# Patient Record
Sex: Male | Born: 1970 | Race: Black or African American | Hispanic: No | Marital: Single | Smoking: Current every day smoker
Health system: Southern US, Community
[De-identification: ages and names within clinical notes are randomized; demographics above are authoritative.]

## PROBLEM LIST (undated history)

## (undated) DIAGNOSIS — N400 Enlarged prostate without lower urinary tract symptoms: Secondary | ICD-10-CM

## (undated) DIAGNOSIS — K219 Gastro-esophageal reflux disease without esophagitis: Secondary | ICD-10-CM

## (undated) DIAGNOSIS — C61 Malignant neoplasm of prostate: Secondary | ICD-10-CM

## (undated) DIAGNOSIS — I739 Peripheral vascular disease, unspecified: Secondary | ICD-10-CM

## (undated) DIAGNOSIS — N289 Disorder of kidney and ureter, unspecified: Secondary | ICD-10-CM

## (undated) DIAGNOSIS — J101 Influenza due to other identified influenza virus with other respiratory manifestations: Secondary | ICD-10-CM

## (undated) DIAGNOSIS — Z9289 Personal history of other medical treatment: Secondary | ICD-10-CM

## (undated) DIAGNOSIS — E119 Type 2 diabetes mellitus without complications: Secondary | ICD-10-CM

## (undated) DIAGNOSIS — E781 Pure hyperglyceridemia: Secondary | ICD-10-CM

## (undated) DIAGNOSIS — M8700 Idiopathic aseptic necrosis of unspecified bone: Secondary | ICD-10-CM

## (undated) DIAGNOSIS — H409 Unspecified glaucoma: Secondary | ICD-10-CM

## (undated) DIAGNOSIS — D696 Thrombocytopenia, unspecified: Secondary | ICD-10-CM

## (undated) DIAGNOSIS — T8859XA Other complications of anesthesia, initial encounter: Secondary | ICD-10-CM

## (undated) DIAGNOSIS — F17200 Nicotine dependence, unspecified, uncomplicated: Secondary | ICD-10-CM

## (undated) DIAGNOSIS — R9431 Abnormal electrocardiogram [ECG] [EKG]: Secondary | ICD-10-CM

## (undated) DIAGNOSIS — J189 Pneumonia, unspecified organism: Secondary | ICD-10-CM

## (undated) DIAGNOSIS — R000 Tachycardia, unspecified: Secondary | ICD-10-CM

## (undated) DIAGNOSIS — D649 Anemia, unspecified: Secondary | ICD-10-CM

## (undated) DIAGNOSIS — N186 End stage renal disease: Secondary | ICD-10-CM

## (undated) DIAGNOSIS — Z9489 Other transplanted organ and tissue status: Secondary | ICD-10-CM

## (undated) DIAGNOSIS — D479 Neoplasm of uncertain behavior of lymphoid, hematopoietic and related tissue, unspecified: Secondary | ICD-10-CM

## (undated) DIAGNOSIS — I10 Essential (primary) hypertension: Secondary | ICD-10-CM

## (undated) HISTORY — PX: KIDNEY TRANSPLANT: SHX239

## (undated) HISTORY — PX: HIP SURGERY: SHX245

## (undated) HISTORY — PX: JOINT REPLACEMENT: SHX530

## (undated) HISTORY — PX: CHOLECYSTECTOMY: SHX55

---

## 1998-02-24 ENCOUNTER — Emergency Department (HOSPITAL_COMMUNITY): Admission: EM | Admit: 1998-02-24 | Discharge: 1998-02-24 | Payer: Self-pay | Admitting: Emergency Medicine

## 2000-10-08 ENCOUNTER — Encounter: Payer: Self-pay | Admitting: Emergency Medicine

## 2000-10-08 ENCOUNTER — Emergency Department (HOSPITAL_COMMUNITY): Admission: EM | Admit: 2000-10-08 | Discharge: 2000-10-08 | Payer: Self-pay

## 2003-02-08 ENCOUNTER — Encounter: Payer: Self-pay | Admitting: Vascular Surgery

## 2003-02-08 ENCOUNTER — Inpatient Hospital Stay (HOSPITAL_COMMUNITY): Admission: EM | Admit: 2003-02-08 | Discharge: 2003-02-15 | Payer: Self-pay | Admitting: Emergency Medicine

## 2003-02-08 ENCOUNTER — Encounter: Payer: Self-pay | Admitting: Nephrology

## 2003-02-08 ENCOUNTER — Encounter: Payer: Self-pay | Admitting: Emergency Medicine

## 2003-02-10 ENCOUNTER — Encounter: Payer: Self-pay | Admitting: Nephrology

## 2003-04-08 ENCOUNTER — Ambulatory Visit (HOSPITAL_COMMUNITY): Admission: RE | Admit: 2003-04-08 | Discharge: 2003-04-08 | Payer: Self-pay | Admitting: Vascular Surgery

## 2003-05-13 ENCOUNTER — Emergency Department (HOSPITAL_COMMUNITY): Admission: EM | Admit: 2003-05-13 | Discharge: 2003-05-14 | Payer: Self-pay | Admitting: Emergency Medicine

## 2003-05-14 ENCOUNTER — Encounter: Payer: Self-pay | Admitting: Emergency Medicine

## 2003-06-03 ENCOUNTER — Ambulatory Visit (HOSPITAL_COMMUNITY): Admission: RE | Admit: 2003-06-03 | Discharge: 2003-06-03 | Payer: Self-pay | Admitting: Nephrology

## 2003-06-30 ENCOUNTER — Ambulatory Visit (HOSPITAL_COMMUNITY): Admission: RE | Admit: 2003-06-30 | Discharge: 2003-06-30 | Payer: Self-pay | Admitting: Vascular Surgery

## 2003-07-12 ENCOUNTER — Emergency Department (HOSPITAL_COMMUNITY): Admission: EM | Admit: 2003-07-12 | Discharge: 2003-07-12 | Payer: Self-pay | Admitting: Emergency Medicine

## 2004-01-29 ENCOUNTER — Inpatient Hospital Stay (HOSPITAL_COMMUNITY): Admission: EM | Admit: 2004-01-29 | Discharge: 2004-01-29 | Payer: Self-pay | Admitting: Emergency Medicine

## 2004-02-22 ENCOUNTER — Emergency Department (HOSPITAL_COMMUNITY): Admission: EM | Admit: 2004-02-22 | Discharge: 2004-02-22 | Payer: Self-pay | Admitting: Emergency Medicine

## 2010-03-06 ENCOUNTER — Emergency Department (HOSPITAL_COMMUNITY): Admission: EM | Admit: 2010-03-06 | Discharge: 2010-03-07 | Payer: Self-pay | Admitting: Emergency Medicine

## 2010-08-12 ENCOUNTER — Encounter: Payer: Self-pay | Admitting: Nephrology

## 2010-08-21 ENCOUNTER — Emergency Department (HOSPITAL_COMMUNITY)
Admission: EM | Admit: 2010-08-21 | Discharge: 2010-08-21 | Payer: Self-pay | Source: Home / Self Care | Admitting: Emergency Medicine

## 2010-08-21 LAB — URINE MICROSCOPIC-ADD ON

## 2010-08-21 LAB — URINALYSIS, ROUTINE W REFLEX MICROSCOPIC
Bilirubin Urine: NEGATIVE
Urine Glucose, Fasting: NEGATIVE mg/dL
Urobilinogen, UA: 0.2 mg/dL (ref 0.0–1.0)

## 2010-08-21 LAB — CBC
HCT: 38.1 % — ABNORMAL LOW (ref 39.0–52.0)
Hemoglobin: 13.3 g/dL (ref 13.0–17.0)
MCH: 32 pg (ref 26.0–34.0)
Platelets: 175 10*3/uL (ref 150–400)
RBC: 4.15 MIL/uL — ABNORMAL LOW (ref 4.22–5.81)
RDW: 14.1 % (ref 11.5–15.5)
WBC: 7.9 10*3/uL (ref 4.0–10.5)

## 2010-08-21 LAB — BASIC METABOLIC PANEL
CO2: 24 mEq/L (ref 19–32)
Calcium: 9.5 mg/dL (ref 8.4–10.5)
Creatinine, Ser: 1.36 mg/dL (ref 0.4–1.5)
GFR calc Af Amer: >60 mL/min (ref >60)
Glucose, Bld: 101 mg/dL — ABNORMAL HIGH (ref 70–99)
Potassium: 3.4 mEq/L — ABNORMAL LOW (ref 3.5–5.1)
Sodium: 139 mEq/L (ref 135–145)

## 2010-08-21 LAB — DIFFERENTIAL
Eosinophils Absolute: 0.1 10*3/uL (ref 0.0–0.7)
Lymphs Abs: 1 10*3/uL (ref 0.7–4.0)

## 2010-08-22 LAB — URINE CULTURE: Colony Count: NO GROWTH

## 2010-10-05 LAB — CBC
HCT: 38.5 % — ABNORMAL LOW (ref 39.0–52.0)
Hemoglobin: 13.6 g/dL (ref 13.0–17.0)
MCH: 31.4 pg (ref 26.0–34.0)
RBC: 4.33 MIL/uL (ref 4.22–5.81)
WBC: 7 10*3/uL (ref 4.0–10.5)

## 2010-10-05 LAB — PROTEIN, BODY FLUID: Total protein, fluid: <3 g/dL

## 2010-10-05 LAB — BASIC METABOLIC PANEL
CO2: 22 mEq/L (ref 19–32)
Calcium: 9.2 mg/dL (ref 8.4–10.5)
Chloride: 106 mEq/L (ref 96–112)
Creatinine, Ser: 1.11 mg/dL (ref 0.4–1.5)
Glucose, Bld: 88 mg/dL (ref 70–99)

## 2010-10-05 LAB — SYNOVIAL CELL COUNT + DIFF, W/ CRYSTALS
Crystals, Fluid: NONE SEEN
Monocyte-Macrophage-Synovial Fluid: 11 % — ABNORMAL LOW (ref 50–90)
Neutrophil, Synovial: 89 % — ABNORMAL HIGH (ref 0–25)

## 2010-10-05 LAB — CULTURE, BLOOD (ROUTINE X 2): Culture: NO GROWTH

## 2010-10-05 LAB — DIFFERENTIAL
Basophils Absolute: 0 10*3/uL (ref 0.0–0.1)
Basophils Relative: 0 % (ref 0–1)
Eosinophils Absolute: 0.1 10*3/uL (ref 0.0–0.7)
Neutro Abs: 5 10*3/uL (ref 1.7–7.7)
Neutrophils Relative %: 71 % (ref 43–77)

## 2010-10-05 LAB — GRAM STAIN

## 2010-10-05 LAB — ANAEROBIC CULTURE

## 2010-10-05 LAB — BODY FLUID CULTURE

## 2010-10-05 LAB — GLUCOSE, SEROUS FLUID: Glucose, Fluid: 61 mg/dL

## 2010-12-08 NOTE — Op Note (Signed)
NAME:  James [name], James [name].                        ACCOUNT NO.:  [id number]   MEDICAL RECORD NO.:  [id number]                   PATIENT TYPE:  INP   LOCATION:                                       FACILITY:  [facility]   PHYSICIAN:  Nelda Severe. Kellie Simmering, M.D.               DATE OF BIRTH:  12/26/1970   DATE OF PROCEDURE:  02/08/2003  DATE OF DISCHARGE:                                 OPERATIVE REPORT   PREOPERATIVE DIAGNOSIS:  End-stage renal disease.   POSTOPERATIVE DIAGNOSIS:  End-stage renal disease.   OPERATION:  1. Creation of a left upper  arm brachial artery to the cephalic vein     arteriovenous fistula Sherre Lain).  2. Insertion Diatek catheter, right internal jugular vein (24-cm).   SURGEON:  Nelda Severe. Kellie Simmering, M.D.   FIRST ASSISTANT:  Suzzanne Cloud, P.A.   ANESTHESIA:  Local.   DESCRIPTION OF PROCEDURE:  The patient was taken to the operating room and  placed in the supine position, at which time the left upper extremity was  prepped with Betadine scrub  and solution and draped in a routine sterile  manner. After infiltration with 1% Xylocaine with epinephrine, a transverse  incision was made in the antecubital area.   The cephalic vein was identified and dissected free. It was an excellent  vein, being about 3.5 to 4 mm in size. It was ligated distally and  transected and heparinized saline could be flushed and palpated up to the  shoulder area. The brachial artery was exposed beneath the fascia and  encircled with vessel loops. It was an excellent vessel. Then 3000 units of  heparin were given intravenously.   The brachial artery was occluded proximally  and distally with vessel loops.  The vein was then carefully  measured and spatulated. After occluding the  artery and opening it with a #15 blade and the Potts scissors, the vein was  anastomosed end-to-side with 6-0 Prolene. The clamp was then released and  there was a palpable pulse and excellent Doppler flow in the fistula.  No  Protamine was given. The wound was irrigated with saline and closed in  layers with Vicryl in a subcuticular fashion. A sterile dressing was  applied.   Attention was turned to the upper chest and neck  which were prepped with  Betadine scrub and solution and draped in a routine sterile manner. After  infiltration with 1% Xylocaine the right internal jugular vein was entered  using a supraclavicular approach. The guide wire passed into the right  atrium under fluoroscopic guidance.   After dilating the tract over the guide wire, a catheter was passed through  a peel-away sheath and positioned in the right atrium and tunneled  appropriately. The catheter was then secured with nylon sutures and the  wound was closed with Vicryl in a subcuticular fashion. A sterile dressing  was applied. The patient was taken to  the recovery room in stable condition.                                                 Nelda Severe Kellie Simmering, M.D.    JDL/MEDQ  D:  02/08/2003  T:  02/08/2003  Job:  [id number]

## 2010-12-08 NOTE — Discharge Summary (Signed)
NAME:  CREE, STRAWDER NO.:  [id number]   MEDICAL RECORD NO.:  [id number]                   PATIENT TYPE:  INP   LOCATION:  6741                                 FACILITY:  [facility]   PHYSICIAN:  Sherril Croon, M.D.                DATE OF BIRTH:  07-Feb-1971   DATE OF ADMISSION:  02/08/2003  DATE OF DISCHARGE:  02/15/2003                                 DISCHARGE SUMMARY   ADMISSION DIAGNOSES:  1. Anemia.  2. Uremia.  3. Hypertension.  4. Abdominal pain.   DISCHARGE DIAGNOSES:  1. Iron deficiency anemia.  2. Secondary hyperparathyroidism.  3. Hypertension.  4. Asymptomatic cholelithiasis.  5. End-stage renal disease with first hemodialysis treatment on 02/09/2003.   PROCEDURES:  1. On 02/08/2003 Dr. Kellie Simmering placed a right IJ Diatek catheter and created a     left AV fistula.  2. Chest x-ray was obtained on 02/08/2003 revealing posterior pleural     thickening on the left with probable blunting of the left posterior     costophrenic angle suggesting the possibility of a small left pleural     effusion.  There was mild cardiomegaly.  3. Ultrasound of the abdomen was performed on 02/08/2003 because of     abdominal pain, nausea; significant findings were; (1) two echogenic     filling defects present in the gallbladder, questionable gallstones,     sludge, (2) small cyst in the dome of the right hepatic lobe, (3)     bilateral abnormally echogenic kidneys, raising the possibility of     chronic renal disease.  4. CT of abdomen and pelvis were obtained because of renal insufficiency and     abdominal pain.  Impression cholelithiasis with a few dependent calcified     gallstones present in the left lower lobe lung atelectasis versus     infiltrate and normal unenhanced CT of the pelvis, read by Eulas Post T.     Kathlene Cote, M.D., this was done on 02/10/2003.  5. Transfusion occurred two units on 02/10/2003 and two units on 02/09/2003     packed red blood  cells due to a hemoglobin of 5.   CONSULTATIONS:  CVTS on 02/08/2003 for which they recommended AV fistula  creation and right IJ Diatek placement.   BRIEF HISTORY:  Mr. Hoole is a 40 year old African-American male who  presented to the emergency room on 02/08/2003 with nausea, vomiting, foul  breath and abdominal discomfort.  He admitted to Dr. Justin Mend upon presentation  in the emergency department that although he had denied fever, chills,  shortness of breath, he did state exertional dyspnea.  Also told Dr. 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  Details of  that hospitalization are still sketchy.   EMERGENCY DEPARTMENT PHYSICAL:  VITAL SIGNS:  Blood pressure 210/110, pulse  80, temperature 97.5.  GENERAL APPEARANCE:  Alert, pleasant gentleman in no obvious distress.  He  has uremic breath and pale complexion.  HEAD/EYES:  Normocephalic and atraumatic.  Pupils equal, round, reactive.  EARS/NOSE/MOUTH/THROAT:  External appearance normal.  NECK:  Supple.  No bruits.  No thyromegaly.  No adenopathy.  CARDIOVASCULAR:  Regular rate and rhythm.  He had an S3/S4 summation gallop.  RESPIRATORY:  Resonant breath sounds, had some bilateral respiratory  crackles.  ABDOMEN:  Soft, had some mild tenderness in the epigastric region, no  organomegaly.  Guaiac stools are negative.  NEUROLOGIC:  Cranial nerves are intact.  Reflexes symmetric.  Had well-  healed surgical scars over bilateral knees.  GENITALIA:  Circumcised bilateral descended testes.  EXTREMITIES:  Pulses 2+ femoral, 2+ PT, right and left.   EMERGENCY DEPARTMENT LABORATORY DATA:  INR 1.15, phosphorus 6.1, UA  greater  than 300 milligrams protein, sodium 132, potassium 4.1, chloride 93, CO2 of  25, BUN 74, creatinine 23.6, glucose 94, bilirubin 0.8, SGOT 11, SGPT 13,  alkaline phosphatase 80, albumin 2.7, calcium 8.9, WBC 7.6, hemoglobin 6.9,  platelets 120.  Serum creatinine in 09/2000 1.5.   HOSPITAL COURSE:  Problem 1.  Uremia:  CVTS was immediately consulted and  they recommended AV fistula creation and placement of Diatek catheter, for  which occurred on 02/08/2003, day of admission.  Mr.  Gledhill received his  first dialysis treatment on 02/09/2003 where we removed one liter, dialyzed  again the following day, removed another liter, dialyzed third day in a row  removing another liter.  We continued dialysis treatments, slowly increasing  his blood flow rates as well as his dialysis treatment times, for a seven  day hospital course; Mr. Hefter received five dialysis treatments.  The last  dialysis treatment on 02/15/2003 we were able to remove almost 2.4 liters.  By hospital day #4, the patient had no complaints, he was feeling well.  On  the day of discharge Mr. Sandefer's creatinine was 10.6, BUN 15,  dropping from  his admission creatinine of 23.6 and admission BUN of 74.   1. Anemia:  Upon admission Mr. Linan's hemoglobin was 6.9, it had continued     to drop down to 5.5 requiring four units of packed red blood cells over a     two day period.  Iron stores were checked, iron was 44, TIBC 171, 26%     trans sat, ferratin 364, Epogen was initiated at 10,000 units     intravenously each treatment and dextran begun as a test dose on     02/11/2003 and then 50 mg given each treatment.   1. Secondary hyperparathyroidism:  The parathyroid hormone was checked and     intact PTH revealed 639.0.  Calcium for PTH 7.5.  Calcijex was started at     0.25 mcg IV each treatment and was increased to 1.5 mcg each treatment.  1. Abdominal pain:  Upon presentation to the emergency department the     patient  complained of abdominal pain, as you can see the results above     and procedures, the CT scan and ultrasound, the patient had gallstones in     the gallbladder.  His dialysis was initiated and the abdominal pain  became better.  The abdominal pain was resolved by hospital day #3.  It     was decided to just follow the gallstones on an outpatient basis.  He had     spiked fevers  on 02/09/2003 for which blood cultures and urine were     obtained.  The blood cultures were obtained one time on 02/09/2003.     These blood cultures were negative.  Urine culture isolated no     uropathogens, he was however empirically started on Ancef 1 gram IV     q.24h.  This was changed to Ancef 2 grams IV each hemodialysis treatment     on 02/12/2003.  Ancef was continued on an outpatient basis because of     continued spiking of fevers and with placement of new catheter, we wanted     to ensure that we had no infection brewing, and the blood cultures final     results were not out until the day after discharge, so this was continued     empirically until 02/19/2003, but I believe at the time of this dictation     though I believe that was actually at the outpatient dialysis unit,     discontinued on 02/18/2003, because I looked up the blood cultures at the     dialysis unit and they turned out to be negative.   HOSPITAL COURSE DISPOSITION:  Mr. Dai upon admission lived with a friend,  in return he would do work for him, not really sure what kind of work that  was; Mr. Peot talked to this friend prior to being discharged about coming  back to live with him, and that friend refused Mr. Tavenner.  Mr. Andreoni thought  about moving into a shelter, he called his mother and his mother agreed to  let him come back home.  This has been difficult for Mr. Feldpausch to have to  move back home, but he agreed to do so given the circumstances.  He was  afraid that he would never be able to return to work, he has had one  year of  college, and we have encouraged him to, now that he is on dialysis, to  return to school.   DISCHARGE MEDICATIONS:  1. Nephro-Vite 1 tablet p.o. daily.  2. Norvasc 10 mg p.o. q.h.s.  3. Labetalol 400 mg 1 tablet b.i.d.  He understands not to take the a.m.     dose on dialysis days.  4. Os-Cal 500 mg 1 tablet with each meal.   DISCHARGE ACTIVITIES:  Activity as tolerated.   DISCHARGE DIET:  He understands to adhere to the Renal/ADA, 1200 cc fluid-  restricted diet.   DISCHARGE INSTRUCTIONS:  1. Mr. Cameron will go to hemodialysis at [facility] on Monday,     Wednesday and Friday at 11:30 a.m. beginning on 02/17/2003.  2. He understands to follow up with Dr. Kellie Simmering on 04/06/2003, Tuesday 10:30     a.m. at Dr. Evelena Leyden office on [facility].  3. Mr. Gaschler is discharged to home with mother in improved condition.   [facility] CHARGE NURSE INSTRUCTIONS: 1. Mr. Dutta will be on a 2K dialytic bath using an __________  [id number]     dialyzer, blood flow rate of 400, dialysis flow rate of 800, variable     sodium, standard heparin, dry weight will be 76 kg, dialysis length 4     hours and 15 minutes.  Epogen 10,000 units IV every treatment.  Calcijex     1.5 mcg IV every treatment.      Delray Alt, PA                   Sherril Croon, M.D.    MJG/MEDQ  D:  02/24/2003  T:  02/24/2003  Job:  [id number]

## 2010-12-08 NOTE — Op Note (Signed)
NAME:  KORIE, TETRICK                         ACCOUNT NO.:  [id number]   MEDICAL RECORD NO.:  [id number]                   PATIENT TYPE:  EMS   LOCATION:  MINO                                 FACILITY:  [facility]   PHYSICIAN:  Jessy Oto. Fields, MD               DATE OF BIRTH:  07/06/1971   DATE OF PROCEDURE:  DATE OF DISCHARGE:  07/12/2003                                 OPERATIVE REPORT   PROCEDURE:  Suture repair of bleeding AV fistula.   PREOPERATIVE DIAGNOSIS:  Hemorrhage from left upper arm AV fistula.   POSTOPERATIVE DIAGNOSIS:  Hemorrhage from left upper arm AV fistula.   ANESTHESIA:  Local.   INDICATIONS:  The patient is a 40 year old male who has had hemodialysis  earlier today and has had continued bleeding from puncture sites which could  not be controlled with pressure after two hours.   FINDINGS:  Two needle punctures; one actively bleeding, one nonactively  bleeding.  The actively bleeding one was repaired with a nylon stitch.   OPERATIVE DETAILS:  The patient's left upper arm was prepped and draped in  the usual sterile fashion.  Direct pressure was held on the fistula below  the operative field.  A 3.0 nylon suture was brought up in the operative  field and a single stitch was placed in the needle puncture site.  Hemostasis was obtained.  A dry sterile dressing was then placed over this.  The patient tolerated the procedure well and there were no complications.                                               Jessy Oto. Fields, MD    CEF/MEDQ  D:  07/12/2003  T:  07/13/2003  Job:  [id number]

## 2010-12-08 NOTE — Op Note (Signed)
NAME:  [name], James NO.:  [id number]   MEDICAL RECORD NO.:  [id number]                   PATIENT TYPE:  OIB   LOCATION:  2899                                 FACILITY:  [facility]   PHYSICIAN:  Nelda Severe. Kellie Simmering, M.D.               DATE OF BIRTH:  [birth year]   DATE OF PROCEDURE:  06/30/2003  DATE OF DISCHARGE:  06/30/2003                                 OPERATIVE REPORT   PREOPERATIVE DIAGNOSIS:  Pseudoaneurysm of arteriovenous fistula, left upper  arm.   POSTOPERATIVE DIAGNOSIS:  Pseudoaneurysm of arteriovenous fistula, left  upper arm.   OPERATION PERFORMED:  Ligation of arteriovenous fistula pseudoaneurysm in  the left upper extremity (suture repair of pseudoaneurysm).   SURGEON:  Nelda Severe. Kellie Simmering, M.D.   ASSISTANT:  John Giovanni, P.A.-C.   ANESTHESIA:  Local.   DESCRIPTION OF PROCEDURE:  The patient was taken to the operating room and  placed in the supine position at which time the left upper extremity was  prepped with Betadine scrub and solution and draped in routine sterile  manner.  After infiltration with 1% Xylocaine with epinephrine, a short  longitudinal incision was made medial to the fistula in the distal upper  arm.  There was a pseudoaneurysm emanating from the medial aspect of the  cephalic vein about 7 cm from the arterial anastomosis extending medially.  The incision was made medial to the pseudoaneurysm and dissection carried  down to the pseudoaneurysm wall.  3000 units of heparin given intravenously  and after dissecting it as much proximally and distally as possible through  the short incision, blood control was obtained by manual compression both  proximally and distally and the pseudoaneurysm sac was entered.  There were  two openings in the sac communicating with the cephalic vein and both of  these were oversewn with 6-0 and 5-0 Prolene from within.  After this was  completed, this obliterated the pseudoaneurysm sac and  there continued to be  an excellent pulse and thrill in the fistula.  No protamine was given.  The  wound was irrigated with saline and closed in layers with Vicryl in  subcuticular fashion.  Sterile dressing applied.  The patient was taken to  the recovery room in satisfactory condition.                                               Nelda Severe Kellie Simmering, M.D.    JDL/MEDQ  D:  06/30/2003  T:  07/01/2003  Job:  [id number]

## 2010-12-08 NOTE — H&P (Signed)
NAME:  James [name], James [name] NO.:  [id number]   MEDICAL RECORD NO.:  [id number]                   PATIENT TYPE:  INP   LOCATION:  6741                                 FACILITY:  [facility]   PHYSICIAN:  Sherril Croon, M.D.                DATE OF BIRTH:  06-Mar-1971   DATE OF ADMISSION:  02/08/2003  DATE OF DISCHARGE:                                HISTORY & PHYSICAL   REASON FOR ADMISSION:  Elevated serum creatinine.   HISTORY OF PRESENT ILLNESS:  This 40 year old African-American male  presented to the emergency room with nausea, vomiting, foul breath, and  abdominal discomfort.   He denies any fevers, chills, shortness of breath, but does state that he  has dyspnea on exertion. He has no headache. He has been taking camomile,  lemon, and lavender for approximately one week. 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   He has a remote history of a staphylococcal infection in [facility] in  1985. He required seven surgeries to his left and right knee and left and  right thigh. He states that he was seen by a physician approximately eight  months ago at [facility].   He had a previous creatinine in March 2002 of 1.5.   PAST MEDICAL HISTORY:  Status post staphylococcal infection in 1985. Details  sketchy.   ALLERGIES:  No known drug allergies.   MEDICATIONS:  No known medications. He has been self-medicating with herbal  remedies.   SOCIAL HISTORY:  He is unmarried. His mother is in her 58s. His father is  deceased in his 50s. He is adopted. He denies tobacco use at present. Stop  smoking approximately two weeks ago two packs per day. Denies any  intravenous drug use. No history of alcohol use. Works in an Engineer, water.  He has one son who is 94 years of age and lives in [place].   FAMILY HISTORY:  No history of end-stage renal disease. He is adopted and  does not know his biological family.   REVIEW OF  SYSTEMS:  GENERAL:  No fevers, sweats, chills. Complains of  fatigue, malaise. EYES:  No visual complaints. No double vision. No history  of eye surgery. EAR, NOSE, AND THROAT:  No hearing loss. No nasal  congestion, runny nose. No sore throat. CARDIOVASCULAR:  He denies any  history of cardiac disease. Denies any anginal chest pains. States he is  dyspneic on exertion. He denies any orthopnea, PND, palpitations, or  blackout spells. Denies any ankle swelling. RESPIRATORY SYSTEM:  Denies any  cough, wheezing, or hemoptysis. He quit smoking two weeks ago. ABDOMINAL  SYSTEM:  Complains of vague generalized abdominal pain, mostly located in  the right upper quadrant. He states the pain does not radiate through to the  back. He states he has been vomiting approximately for the last  two weeks  early in the morning with nausea and loss of appetite for approximately one  month and weight loss of approximately 15 pounds. UROGENITAL:  He still  urinates. Has some urinary frequency at night time. No dysuria or frequency.  NEUROLOGICAL:  He denies any headache. He denies any numbness or tingling in  his upper or lower extremities. Denies any double vision. Denies any  dysarthria or dysphagia. ENDOCRINE:  No history of diabetes. No history of  thyroid disease. RHEUMATOLOGIC:  No history of nonsteroidal anti-  inflammatories. Status post knee surgery and hip surgery.  HEMATOLOGIC/ONCOLOGIC:  No history of DVT, pulmonary embolus. No history of  carcinoma.   PHYSICAL EXAMINATION:  GENERAL:  Alert, pleasant gentleman in no obvious  distress. He has uremic breath and pale complexion.  VITAL SIGNS:  Blood pressure 210/110, pulse 80, temperature 97.5.  HEAD AND EYES:  Normocephalic, atraumatic. Pupils are equal, round, and  reactive.  EARS, NOSE, MOUTH, AND THROAT:  External appearance normal.  NECK:  Supple. No bruits. No thyromegaly. No adenopathy.  CARDIOVASCULAR:  Regular rate and rhythm. He had a  S3/S4 summation gallop.  RESPIRATORY SYSTEM:  Resonant breath sounds. Had some bilateral respiratory  crackles.  ABDOMEN:  Soft. Had some mild tenderness in the epigastric region. No  organomegaly. Guaiac stools are negative.  NEUROLOGICAL:  Cranial nerves are intact. Reflexes symmetric. He had well  healed surgical scars over both knees.  GENITALIA:  He is circumcised, bilateral descended testes.  EXTREMITIES:  Pulses 2+ femoral, 2+ PT, right and left.   LABORATORY DATA:  INR 1.1, phosphorous 6.1. Urine microscopy planned.  Urinalysis:  Greater than 300 mg of protein. Sodium 132, potassium 4.1,  chloride 93, CO2 25, BUN 74, creatinine 23.6, glucose 94. Bilirubin 0.8,  SGOT 11, SGPT 13, alkaline phosphatase 80, albumin 2.7, calcium 8.9. WBCs  7.6, hemoglobin 6.9, platelets 120. Serum creatinine 1.24 September 2000. Renal  ultrasound:  Bilateral symmetric echogenic kidneys.   ASSESSMENT/PLAN:  1. Uremic signs and symptoms. The patient will need to start hemodialysis. I     believe this represents end-stage renal disease. I do not think patient     will benefit from kidney biopsy. Will have CVTS place PermCath and AV     fistula for access.  2. End-stage renal disease. Do not think he will require a renal biopsy. 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 Will     check SPEP, UPEP.  3. Anemia. Will check stools. Start Protonix IV 40 mg b.i.d. for abdominal     pain. Consider gastroenterology consultation if abdominal pain continues.     Will check iron stores, TIBC, percent saturation.  4. Bones. Will check PTH.  5. Nutrition. Renal diet __________.  6. Hypertension. Continue labetalol 200 mg p.o. q.d., Norvasc 10 mg q.d.                                               Sherril Croon, M.D.    MWW/MEDQ  D:  02/08/2003  T:  02/08/2003  Job:  [id number]

## 2011-03-19 ENCOUNTER — Emergency Department (HOSPITAL_COMMUNITY)
Admission: EM | Admit: 2011-03-19 | Discharge: 2011-03-19 | Disposition: A | Discharge status: Home / Self Care | Payer: Self-pay | Attending: Emergency Medicine | Admitting: Emergency Medicine

## 2011-09-07 DIAGNOSIS — K219 Gastro-esophageal reflux disease without esophagitis: Secondary | ICD-10-CM | POA: Insufficient documentation

## 2011-10-03 DIAGNOSIS — B2709 Gammaherpesviral mononucleosis with other complications: Secondary | ICD-10-CM | POA: Insufficient documentation

## 2011-10-03 DIAGNOSIS — C8330 Diffuse large B-cell lymphoma, unspecified site: Secondary | ICD-10-CM | POA: Insufficient documentation

## 2012-05-09 DIAGNOSIS — T8611 Kidney transplant rejection: Secondary | ICD-10-CM | POA: Insufficient documentation

## 2012-10-21 DIAGNOSIS — N400 Enlarged prostate without lower urinary tract symptoms: Secondary | ICD-10-CM | POA: Insufficient documentation

## 2014-04-09 ENCOUNTER — Encounter (HOSPITAL_COMMUNITY): Payer: Self-pay | Admitting: Emergency Medicine

## 2014-04-09 ENCOUNTER — Inpatient Hospital Stay (HOSPITAL_COMMUNITY)
Admission: EM | Admit: 2014-04-09 | Discharge: 2014-04-11 | DRG: 690 | Disposition: A | Discharge status: Home / Self Care | Payer: Medicare Other | Attending: Internal Medicine | Admitting: Internal Medicine

## 2014-04-09 DIAGNOSIS — I150 Renovascular hypertension: Secondary | ICD-10-CM

## 2014-04-09 DIAGNOSIS — N390 Urinary tract infection, site not specified: Principal | ICD-10-CM

## 2014-04-09 DIAGNOSIS — Z79899 Other long term (current) drug therapy: Secondary | ICD-10-CM

## 2014-04-09 DIAGNOSIS — R000 Tachycardia, unspecified: Secondary | ICD-10-CM | POA: Diagnosis present

## 2014-04-09 DIAGNOSIS — F17200 Nicotine dependence, unspecified, uncomplicated: Secondary | ICD-10-CM

## 2014-04-09 DIAGNOSIS — Z794 Long term (current) use of insulin: Secondary | ICD-10-CM

## 2014-04-09 DIAGNOSIS — R300 Dysuria: Secondary | ICD-10-CM | POA: Diagnosis not present

## 2014-04-09 DIAGNOSIS — N186 End stage renal disease: Secondary | ICD-10-CM

## 2014-04-09 DIAGNOSIS — N3001 Acute cystitis with hematuria: Secondary | ICD-10-CM

## 2014-04-09 DIAGNOSIS — N189 Chronic kidney disease, unspecified: Secondary | ICD-10-CM | POA: Diagnosis present

## 2014-04-09 DIAGNOSIS — Z9489 Other transplanted organ and tissue status: Secondary | ICD-10-CM

## 2014-04-09 DIAGNOSIS — N400 Enlarged prostate without lower urinary tract symptoms: Secondary | ICD-10-CM | POA: Diagnosis present

## 2014-04-09 DIAGNOSIS — I10 Essential (primary) hypertension: Secondary | ICD-10-CM | POA: Diagnosis present

## 2014-04-09 DIAGNOSIS — I129 Hypertensive chronic kidney disease with stage 1 through stage 4 chronic kidney disease, or unspecified chronic kidney disease: Secondary | ICD-10-CM | POA: Diagnosis present

## 2014-04-09 DIAGNOSIS — E119 Type 2 diabetes mellitus without complications: Secondary | ICD-10-CM | POA: Diagnosis present

## 2014-04-09 DIAGNOSIS — Z940 Kidney transplant status: Secondary | ICD-10-CM

## 2014-04-09 HISTORY — DX: Type 2 diabetes mellitus without complications: E11.9

## 2014-04-09 HISTORY — DX: Other transplanted organ and tissue status: Z94.89

## 2014-04-09 HISTORY — DX: Disorder of kidney and ureter, unspecified: N28.9

## 2014-04-09 HISTORY — DX: Nicotine dependence, unspecified, uncomplicated: F17.200

## 2014-04-09 HISTORY — DX: Essential (primary) hypertension: I10

## 2014-04-09 LAB — URINALYSIS, ROUTINE W REFLEX MICROSCOPIC
BILIRUBIN URINE: NEGATIVE
GLUCOSE, UA: NEGATIVE mg/dL
Ketones, ur: NEGATIVE mg/dL
Nitrite: NEGATIVE
PH: 6 (ref 5.0–8.0)
SPECIFIC GRAVITY, URINE: 1.018 (ref 1.005–1.030)
Urobilinogen, UA: 0.2 mg/dL (ref 0.0–1.0)

## 2014-04-09 LAB — COMPREHENSIVE METABOLIC PANEL
ALBUMIN: 2.9 g/dL — AB (ref 3.5–5.2)
ALT: 24 U/L (ref 0–53)
AST: 26 U/L (ref 0–37)
Alkaline Phosphatase: 107 U/L (ref 39–117)
Anion gap: 16 — ABNORMAL HIGH (ref 5–15)
BILIRUBIN TOTAL: 0.4 mg/dL (ref 0.3–1.2)
BUN: 18 mg/dL (ref 6–23)
CALCIUM: 9.5 mg/dL (ref 8.4–10.5)
CHLORIDE: 104 meq/L (ref 96–112)
CO2: 19 mEq/L (ref 19–32)
CREATININE: 2.21 mg/dL — AB (ref 0.50–1.35)
GFR calc Af Amer: 41 mL/min — ABNORMAL LOW (ref >90)
GFR, EST NON AFRICAN AMERICAN: 35 mL/min — AB (ref >90)
GLUCOSE: 86 mg/dL (ref 70–99)
Potassium: 3.3 mEq/L — ABNORMAL LOW (ref 3.7–5.3)
Sodium: 139 mEq/L (ref 137–147)
Total Protein: 8.7 g/dL — ABNORMAL HIGH (ref 6.0–8.3)

## 2014-04-09 LAB — URINE MICROSCOPIC-ADD ON

## 2014-04-09 LAB — CBC WITH DIFFERENTIAL/PLATELET
BASOS ABS: 0 10*3/uL (ref 0.0–0.1)
Basophils Relative: 0 % (ref 0–1)
EOS ABS: 0.3 10*3/uL (ref 0.0–0.7)
Eosinophils Relative: 3 % (ref 0–5)
HCT: 34.9 % — ABNORMAL LOW (ref 39.0–52.0)
Hemoglobin: 12.2 g/dL — ABNORMAL LOW (ref 13.0–17.0)
LYMPHS ABS: 1.7 10*3/uL (ref 0.7–4.0)
Lymphocytes Relative: 20 % (ref 12–46)
MCH: 28.7 pg (ref 26.0–34.0)
MCHC: 35 g/dL (ref 30.0–36.0)
MCV: 82.1 fL (ref 78.0–100.0)
MONO ABS: 0.9 10*3/uL (ref 0.1–1.0)
Monocytes Relative: 10 % (ref 3–12)
NEUTROS PCT: 67 % (ref 43–77)
Neutro Abs: 5.9 10*3/uL (ref 1.7–7.7)
PLATELETS: 202 10*3/uL (ref 150–400)
RBC: 4.25 MIL/uL (ref 4.22–5.81)
RDW: 14.5 % (ref 11.5–15.5)
WBC: 8.7 10*3/uL (ref 4.0–10.5)

## 2014-04-09 LAB — LACTIC ACID, PLASMA: Lactic Acid, Venous: 0.9 mmol/L (ref 0.5–2.2)

## 2014-04-09 MED ORDER — SODIUM CHLORIDE 0.9 % IV BOLUS (SEPSIS)
1000.0000 mL | Freq: Once | INTRAVENOUS | Status: AC
  Administered 2014-04-09: 1000 mL via INTRAVENOUS

## 2014-04-09 MED ORDER — DEXTROSE 5 % IV SOLN
1.0000 g | Freq: Once | INTRAVENOUS | Status: AC
  Administered 2014-04-09: 1 g via INTRAVENOUS
  Filled 2014-04-09: qty 10

## 2014-04-09 NOTE — ED Notes (Signed)
Dr. Lucrezia Starch finished at [facility]. Admitting MD at [facility]. James [name] in to see.

## 2014-04-09 NOTE — ED Provider Notes (Signed)
CSN: [id number]     Arrival date & time 04/09/14  1949 History   First MD Initiated Contact with Patient 04/09/14 2154     Chief Complaint  Patient presents with  . Abdominal Pain     (Consider location/radiation/quality/duration/timing/severity/associated sxs/prior Treatment) HPI James [name] 43 y.o. with a pmh of a renal transplant, HTN, DM presents with dysuria. This started in the past 2 days. It is burning in character, non radiating, worsened with urinating, no known relieving factors. Associated with urinary frequency and urinary retention, and a sensation of incompletely emptying his bladder. He is followed at [facility] for you renal failure. He reports his Cr is typically between 2.5-2.8. He recently relocated to [facility], which is why he came here today. He has also had chills and sweats over the past 24 hours as well. Denies N/V/D, abdominal pain, chest pain, SOB. He is still currently on immunosuppressants.   Past Medical History  Diagnosis Date  . Renal disorder   . Hypertension   . Diabetes mellitus without complication   . Transplant recipient    History reviewed. No pertinent past surgical history. No family history on file. History  Substance Use Topics  . Smoking status: Current Every Day Smoker  . Smokeless tobacco: Not on file  . Alcohol Use: No    Review of Systems  All other systems reviewed and are negative.     Allergies  Darvon and Tramadol  Home Medications   Prior to Admission medications   Not on File   BP 125/71  Pulse 123  Temp(Src) 98.7 F (37.1 C) (Oral)  Resp 20  SpO2 98% Physical Exam  Nursing note and vitals reviewed. Constitutional: He is oriented to person, place, and time. He appears well-developed and well-nourished. No distress.  HENT:  Head: Normocephalic and atraumatic.  Eyes: Conjunctivae and EOM are normal. Right eye exhibits no discharge. Left eye exhibits no discharge.  Neck: Normal range of motion. Neck  supple. No tracheal deviation present.  Cardiovascular: Regular rhythm and normal heart sounds.  Tachycardia present.  Exam reveals no friction rub.   No murmur heard. Pulmonary/Chest: Effort normal and breath sounds normal. No stridor. No respiratory distress. He has no wheezes. He has no rales. He exhibits no tenderness.  Abdominal: Soft. He exhibits no distension. There is no tenderness. There is no rebound and no guarding.  Neurological: He is alert and oriented to person, place, and time.  Skin: Skin is warm.  Psychiatric: He has a normal mood and affect.    ED Course  Procedures (including critical care time) Labs Review Labs Reviewed  CBC WITH DIFFERENTIAL - Abnormal; Notable for the following:    Hemoglobin 12.2 (*)    HCT 34.9 (*)    All other components within normal limits  COMPREHENSIVE METABOLIC PANEL - Abnormal; Notable for the following:    Potassium 3.3 (*)    Creatinine, Ser 2.21 (*)    Total Protein 8.7 (*)    Albumin 2.9 (*)    GFR calc non Af Amer 35 (*)    GFR calc Af Amer 41 (*)    Anion gap 16 (*)    All other components within normal limits  URINALYSIS, ROUTINE W REFLEX MICROSCOPIC - Abnormal; Notable for the following:    APPearance CLOUDY (*)    Hgb urine dipstick LARGE (*)    Protein, ur >300 (*)    Leukocytes, UA SMALL (*)    All other components within normal limits  URINE MICROSCOPIC-ADD ON - Abnormal; Notable for the following:    Bacteria, UA FEW (*)    Casts HYALINE CASTS (*)    All other components within normal limits  URINE CULTURE  LACTIC ACID, PLASMA    Imaging Review No results found.   EKG Interpretation None      MDM   Final diagnoses:  None    Pt with CKD presents with urinary symptoms. AFVSS. Slightly tachycardic. Normotensive. O2 sats wnls on room air. No abd ttp. UA c/w uti. Post void residual 110cc. Given his immunosuppression, admission for observation overnight to monitor vital signs for improvement and to continue  IV abx. Will given 1 L NaCl, and Rocephin as he reports tolerating this in the past. WIll admit to the hospitalist for an OBS admission. Patient was HDS while under my care, and care discussed with my attending, Dr. Reather Converse.     Kelby Aline, MD 04/09/14 [phone]

## 2014-04-09 NOTE — ED Notes (Signed)
The pt is a kidney transplant pt for 6 years.   For the past 2 days he has had some lower abd pain and he feel like he can empty his bladder and he has painful urination. No blood in urine

## 2014-04-09 NOTE — H&P (Addendum)
Patient's PCP: No primary provider on file. Patient's renal transplant center: [facility]  Chief Complaint: Dysuria  History of Present Illness: James [name] is a 43 y.o. African American male with history of hypertension, diabetes, chronic kidney disease status post cadaveric renal transplant 6 years ago who presents with the above complaints.  Patient recently moved to [place], [place].  Over the last 1-1/2 days he has experienced urinary hesitancy and burning with urination.  He has noted chills at home.  Given history of renal transplant and immunosuppressive therapy, he presented to the emergency department for further evaluation.  In the emergency department he was found to have urinary tract infection, as was service was asked to admit the patient for further care and management.  He denies any fevers, nausea, vomiting, chest pain, shortness of breath, abdominal pain, diarrhea, headaches or vision changes.  Briefly reviewing patient's records from [facility] showed that his baseline creatinine is around 2.5.  Of note, ED performed bedside ultrasound to evaluate for any hydronephrosis, they noted 100 cc of urine after post void residual.  Review of Systems: All systems reviewed with the patient and positive as per history of present illness, otherwise all other systems are negative.  Past Medical History  Diagnosis Date  . Renal disorder   . Hypertension   . Diabetes mellitus without complication   . Transplant recipient   . Smoker    History reviewed. No pertinent past surgical history. Family History  Problem Relation Age of Onset  . Adopted: Yes   History   Social History  . Marital Status: Single    Spouse Name: N/A    Number of Children: N/A  . Years of Education: N/A   Occupational History  . Not on file.   Social History Main Topics  . Smoking status: Current Every Day Smoker -- 0.50 packs/day  . Smokeless tobacco: Not on file  . Alcohol  Use: No  . Drug Use: Not on file  . Sexual Activity: Not on file   Other Topics Concern  . Not on file   Social History Narrative  . No narrative on file   Allergies: Darvon and Tramadol  Home Meds: Prior to Admission medications   Not on File    Physical Exam: Blood pressure 113/80, pulse 111, temperature 98.7 F (37.1 C), temperature source Oral, resp. rate 22, SpO2 98.00%. General: Awake, Oriented x3, No acute distress. HEENT: EOMI, Moist mucous membranes Neck: Supple CV: S1 and S2 Lungs: Clear to ascultation bilaterally Abdomen: Soft, Nontender, Nondistended, +bowel sounds, right-sided abdominal incision noted, no tenderness around transplanted kidney. Ext: Good pulses. Trace edema. No clubbing or cyanosis noted. Neuro: Cranial Nerves II-XII grossly intact. Has 5/5 motor strength in upper and lower extremities.  Lab results:  Recent Labs  04/09/14 2010  NA 139  K 3.3*  CL 104  CO2 19  GLUCOSE 86  BUN 18  CREATININE 2.21*  CALCIUM 9.5    Recent Labs  04/09/14 2010  AST 26  ALT 24  ALKPHOS 107  BILITOT 0.4  PROT 8.7*  ALBUMIN 2.9*   No results found for this basename: LIPASE, AMYLASE,  in the last 72 hours  Recent Labs  04/09/14 2010  WBC 8.7  NEUTROABS 5.9  HGB 12.2*  HCT 34.9*  MCV 82.1  PLT 202   No results found for this basename: CKTOTAL, CKMB, CKMBINDEX, TROPONINI,  in the last 72 hours No components found with this basename: POCBNP,  No results  found for this basename: DDIMER,  in the last 72 hours No results found for this basename: HGBA1C,  in the last 72 hours No results found for this basename: CHOL, HDL, LDLCALC, TRIG, CHOLHDL, LDLDIRECT,  in the last 72 hours No results found for this basename: TSH, T4TOTAL, FREET3, T3FREE, THYROIDAB,  in the last 72 hours No results found for this basename: VITAMINB12, FOLATE, FERRITIN, TIBC, IRON, RETICCTPCT,  in the last 72 hours Imaging results:  No results found. Other results: EKG:  Sinus tachycardia.  Assessment & Plan by Problem: Urinary tract infection in the setting of immunosuppressive therapy and status post renal transplant Continue ceftriaxone which was started in the emergency department.  Further titration of antibiotics depending on urine culture results and sensitivities.  Tachycardia Likely due to urinary tract infection.  May have component of reflex tachycardia from not taking his evening dose of metoprolol.  Heart rate improved with gentle hydration.  Continue to monitor.  Status post renal transplant Resume home immunosuppressive therapy.  If patient has prolonged hospitalization, likely will need renal consultation for management of his medications.  Diabetes Diabetic diet.  Sliding scale insulin.  Hypertension Stable.  Continue home antihypertensive medications.  Tobacco use Counseled on cessation.  Patient declined nicotine patch, he indicated that after discharge he will use nicotine gum to help with cessation.  Prophylaxis Lovenox  CODE STATUS Full code  Disposition Admit the patient to medical bed as observation.  Time spent on admission, talking to the patient, and coordinating care was: 50 mins.  Ladan Vanderzanden A, MD 04/10/2014, 12:05 AM

## 2014-04-09 NOTE — ED Notes (Signed)
Bladder scanned pt, machine read 101 mL. RN notified

## 2014-04-10 ENCOUNTER — Observation Stay (HOSPITAL_COMMUNITY): Payer: Medicare Other

## 2014-04-10 ENCOUNTER — Encounter (HOSPITAL_COMMUNITY): Payer: Self-pay | Admitting: *Deleted

## 2014-04-10 DIAGNOSIS — I129 Hypertensive chronic kidney disease with stage 1 through stage 4 chronic kidney disease, or unspecified chronic kidney disease: Secondary | ICD-10-CM | POA: Diagnosis present

## 2014-04-10 DIAGNOSIS — N189 Chronic kidney disease, unspecified: Secondary | ICD-10-CM | POA: Diagnosis present

## 2014-04-10 DIAGNOSIS — E119 Type 2 diabetes mellitus without complications: Secondary | ICD-10-CM | POA: Diagnosis present

## 2014-04-10 DIAGNOSIS — Z79899 Other long term (current) drug therapy: Secondary | ICD-10-CM | POA: Diagnosis not present

## 2014-04-10 DIAGNOSIS — Z940 Kidney transplant status: Secondary | ICD-10-CM | POA: Diagnosis not present

## 2014-04-10 DIAGNOSIS — R300 Dysuria: Secondary | ICD-10-CM | POA: Diagnosis present

## 2014-04-10 DIAGNOSIS — N390 Urinary tract infection, site not specified: Secondary | ICD-10-CM | POA: Diagnosis present

## 2014-04-10 DIAGNOSIS — N400 Enlarged prostate without lower urinary tract symptoms: Secondary | ICD-10-CM | POA: Diagnosis present

## 2014-04-10 DIAGNOSIS — F17200 Nicotine dependence, unspecified, uncomplicated: Secondary | ICD-10-CM | POA: Diagnosis present

## 2014-04-10 DIAGNOSIS — I150 Renovascular hypertension: Secondary | ICD-10-CM

## 2014-04-10 DIAGNOSIS — Z794 Long term (current) use of insulin: Secondary | ICD-10-CM | POA: Diagnosis not present

## 2014-04-10 LAB — CBC
HCT: 31.3 % — ABNORMAL LOW (ref 39.0–52.0)
HEMOGLOBIN: 10.5 g/dL — AB (ref 13.0–17.0)
MCH: 28.2 pg (ref 26.0–34.0)
MCHC: 33.5 g/dL (ref 30.0–36.0)
MCV: 83.9 fL (ref 78.0–100.0)
Platelets: 153 10*3/uL (ref 150–400)
RBC: 3.73 MIL/uL — AB (ref 4.22–5.81)
RDW: 14.9 % (ref 11.5–15.5)
WBC: 6.3 10*3/uL (ref 4.0–10.5)

## 2014-04-10 LAB — BASIC METABOLIC PANEL
ANION GAP: 13 (ref 5–15)
BUN: 21 mg/dL (ref 6–23)
CO2: 18 mEq/L — ABNORMAL LOW (ref 19–32)
Calcium: 8.7 mg/dL (ref 8.4–10.5)
Chloride: 110 mEq/L (ref 96–112)
Creatinine, Ser: 2.17 mg/dL — ABNORMAL HIGH (ref 0.50–1.35)
GFR calc Af Amer: 42 mL/min — ABNORMAL LOW (ref >90)
GFR calc non Af Amer: 36 mL/min — ABNORMAL LOW (ref >90)
Glucose, Bld: 106 mg/dL — ABNORMAL HIGH (ref 70–99)
POTASSIUM: 3.5 meq/L — AB (ref 3.7–5.3)
SODIUM: 141 meq/L (ref 137–147)

## 2014-04-10 LAB — GLUCOSE, CAPILLARY
GLUCOSE-CAPILLARY: 141 mg/dL — AB (ref 70–99)
GLUCOSE-CAPILLARY: 121 mg/dL — AB (ref 70–99)
Glucose-Capillary: 123 mg/dL — ABNORMAL HIGH (ref 70–99)
Glucose-Capillary: 194 mg/dL — ABNORMAL HIGH (ref 70–99)
Glucose-Capillary: 221 mg/dL — ABNORMAL HIGH (ref 70–99)

## 2014-04-10 LAB — MRSA PCR SCREENING: MRSA BY PCR: NEGATIVE

## 2014-04-10 MED ORDER — SODIUM CHLORIDE 0.9 % IV SOLN
INTRAVENOUS | Status: AC
  Administered 2014-04-10 (×2): via INTRAVENOUS

## 2014-04-10 MED ORDER — ACETAMINOPHEN 650 MG RE SUPP: 650.0000 mg | Freq: Four times a day (QID) | RECTAL | Status: DC | PRN

## 2014-04-10 MED ORDER — EVEROLIMUS 0.25 MG PO TABS
1.0000 mg | ORAL_TABLET | Freq: Every day | ORAL | Status: DC
  Administered 2014-04-11: 1 mg via ORAL

## 2014-04-10 MED ORDER — SODIUM BICARBONATE 650 MG PO TABS
1300.0000 mg | ORAL_TABLET | Freq: Three times a day (TID) | ORAL | Status: DC
  Administered 2014-04-10 – 2014-04-11 (×4): 1300 mg via ORAL
  Filled 2014-04-10 (×6): qty 2

## 2014-04-10 MED ORDER — PREDNISONE 5 MG PO TABS
5.0000 mg | ORAL_TABLET | Freq: Every day | ORAL | Status: DC
  Administered 2014-04-10 – 2014-04-11 (×2): 5 mg via ORAL
  Filled 2014-04-10 (×4): qty 1

## 2014-04-10 MED ORDER — INSULIN GLARGINE 100 UNIT/ML ~~LOC~~ SOLN
40.0000 [IU] | Freq: Every day | SUBCUTANEOUS | Status: DC
  Administered 2014-04-10 (×2): 40 [IU] via SUBCUTANEOUS
  Filled 2014-04-10 (×3): qty 0.4

## 2014-04-10 MED ORDER — DOCUSATE SODIUM 100 MG PO CAPS
100.0000 mg | ORAL_CAPSULE | Freq: Two times a day (BID) | ORAL | Status: DC | PRN
  Administered 2014-04-11: 100 mg via ORAL
  Filled 2014-04-10: qty 1

## 2014-04-10 MED ORDER — EVEROLIMUS 0.75 MG PO TABS
0.7500 mg | ORAL_TABLET | Freq: Every evening | ORAL | Status: DC
  Administered 2014-04-10: 0.75 mg via ORAL

## 2014-04-10 MED ORDER — ZOLPIDEM TARTRATE 5 MG PO TABS
5.0000 mg | ORAL_TABLET | Freq: Every day | ORAL | Status: DC
  Administered 2014-04-10 (×2): 5 mg via ORAL
  Filled 2014-04-10 (×2): qty 1

## 2014-04-10 MED ORDER — ONDANSETRON HCL 4 MG PO TABS: 4.0000 mg | ORAL_TABLET | Freq: Four times a day (QID) | ORAL | Status: DC | PRN

## 2014-04-10 MED ORDER — METOPROLOL TARTRATE 50 MG PO TABS
50.0000 mg | ORAL_TABLET | Freq: Two times a day (BID) | ORAL | Status: DC
  Administered 2014-04-10 – 2014-04-11 (×4): 50 mg via ORAL
  Filled 2014-04-10 (×5): qty 1

## 2014-04-10 MED ORDER — ACETAMINOPHEN 325 MG PO TABS: 650.0000 mg | ORAL_TABLET | Freq: Four times a day (QID) | ORAL | Status: DC | PRN

## 2014-04-10 MED ORDER — ENOXAPARIN SODIUM 40 MG/0.4ML ~~LOC~~ SOLN
40.0000 mg | Freq: Every day | SUBCUTANEOUS | Status: DC
  Administered 2014-04-10: 40 mg via SUBCUTANEOUS
  Filled 2014-04-10 (×2): qty 0.4

## 2014-04-10 MED ORDER — LISINOPRIL 10 MG PO TABS
10.0000 mg | ORAL_TABLET | Freq: Every day | ORAL | Status: DC
  Administered 2014-04-10 – 2014-04-11 (×2): 10 mg via ORAL
  Filled 2014-04-10 (×2): qty 1

## 2014-04-10 MED ORDER — EVEROLIMUS 0.75 MG PO TABS
0.7500 mg | ORAL_TABLET | Freq: Every evening | ORAL | Status: DC
  Filled 2014-04-10: qty 1

## 2014-04-10 MED ORDER — TAMSULOSIN HCL 0.4 MG PO CAPS
0.4000 mg | ORAL_CAPSULE | Freq: Every evening | ORAL | Status: DC
  Administered 2014-04-10: 0.4 mg via ORAL
  Filled 2014-04-10 (×2): qty 1

## 2014-04-10 MED ORDER — ADULT MULTIVITAMIN W/MINERALS CH
1.0000 | ORAL_TABLET | Freq: Every day | ORAL | Status: DC
  Administered 2014-04-10 – 2014-04-11 (×2): 1 via ORAL
  Filled 2014-04-10 (×2): qty 1

## 2014-04-10 MED ORDER — INSULIN ASPART 100 UNIT/ML ~~LOC~~ SOLN
0.0000 [IU] | Freq: Three times a day (TID) | SUBCUTANEOUS | Status: DC
  Administered 2014-04-10: 2 [IU] via SUBCUTANEOUS
  Administered 2014-04-10: 1 [IU] via SUBCUTANEOUS

## 2014-04-10 MED ORDER — INSULIN ASPART 100 UNIT/ML ~~LOC~~ SOLN
0.0000 [IU] | Freq: Every day | SUBCUTANEOUS | Status: DC
  Administered 2014-04-10: 2 [IU] via SUBCUTANEOUS

## 2014-04-10 MED ORDER — DEXTROSE 5 % IV SOLN
1.0000 g | INTRAVENOUS | Status: DC
  Administered 2014-04-10: 1 g via INTRAVENOUS
  Filled 2014-04-10 (×2): qty 10

## 2014-04-10 MED ORDER — VALACYCLOVIR HCL 500 MG PO TABS
500.0000 mg | ORAL_TABLET | Freq: Every day | ORAL | Status: DC
  Administered 2014-04-10 – 2014-04-11 (×2): 500 mg via ORAL
  Filled 2014-04-10 (×2): qty 1

## 2014-04-10 MED ORDER — ONDANSETRON HCL 4 MG/2ML IJ SOLN: 4.0000 mg | Freq: Four times a day (QID) | INTRAMUSCULAR | Status: DC | PRN

## 2014-04-10 MED ORDER — AMLODIPINE BESYLATE 10 MG PO TABS
10.0000 mg | ORAL_TABLET | Freq: Every day | ORAL | Status: DC
  Administered 2014-04-10 – 2014-04-11 (×2): 10 mg via ORAL
  Filled 2014-04-10 (×2): qty 1

## 2014-04-10 MED ORDER — EVEROLIMUS 0.25 MG PO TABS
1.0000 mg | ORAL_TABLET | Freq: Every day | ORAL | Status: DC
  Filled 2014-04-10 (×2): qty 4

## 2014-04-10 NOTE — ED Provider Notes (Signed)
Medical screening examination/treatment/procedure(s) were conducted as a shared visit with non-physician practitioner(s) or resident  and myself.  I personally evaluated the patient during the encounter and agree with the findings.   I have personally reviewed any xrays and/ or EKG's with the provider and I agree with interpretation.   Patient's kidney transplant history presents with suprapubic pain, tachycardia and urinalysis revealing significant infection. Tenderness over transplant site, no systemic symptoms at this time. IV antibiotics and plan for observation given immunosuppression and transplant history.  Urine infection, kidney transplant history  Mariea Clonts, MD 04/10/14 [phone]

## 2014-04-10 NOTE — Progress Notes (Signed)
Received report from ED RN.

## 2014-04-10 NOTE — Progress Notes (Signed)
[facility] PROGRESS NOTE  Chae Robley Machorro [id number] DOB: 01-Feb-1971 DOA: 04/09/2014 PCP: No primary provider on file.  Assessment/Plan: 43 y.o. Male with PMH of HTN, DM, BPH, CDK s/p cadaveric renal transplant 6 years ago who presents with the dysuria, chills is admitted with probable UTI -Briefly reviewing patient's records from [facility] showed that his baseline creatinine is around 2.5. Of note, ED performed bedside ultrasound to evaluate for any hydronephrosis, they noted 100 cc of urine after post void residual.   1. Probably UTI; urine culture pend; no leukocytosis; afebrile; exam unremarkable;  -started on IV atx, f/u cultures;   2. CKD s/p cadaveric renal transplant 6 years ago -baseline Cr 2.5; cont monitor; f/u at [facility]  -hematuria; obtain renal US  3. HTn, cont home regimen  4. DM, cont insulin regimen  5. BPH cont home regimen  Code Status: full Family Communication: d/w patient (indicate person spoken with, relationship, and if by phone, the number) Disposition Plan: home 24-48 hrs    Consultants:  Nephrology over the phone   Procedures:  none  Antibiotics:  Ceftriaxone 9/19<<< (indicate start date, and stop date if known)  HPI/Subjective: alert  Objective: Filed Vitals:   04/10/14 0549  BP: 128/83  Pulse: 85  Temp: 97.3 F (36.3 C)  Resp: 18    Intake/Output Summary (Last 24 hours) at 04/10/14 0851 Last data filed at 04/10/14 0043  Gross per 24 hour  Intake    240 ml  Output     50 ml  Net    190 ml   Filed Weights   04/10/14 0042 04/10/14 0549  Weight: 85.412 kg (188 lb 4.8 oz) 85.548 kg (188 lb 9.6 oz)    Exam:   General:  alert  Cardiovascular: s1,s2 rrr  Respiratory: CTA BL  Abdomen: soft, nt,nd   Musculoskeletal: no LE edeam   Data Reviewed: Basic Metabolic Panel:  Recent Labs Lab 04/09/14 2010 04/10/14 0650  NA 139 141  K 3.3* 3.5*  CL 104 110  CO2 19 18*  GLUCOSE 86 106*  BUN 18 21   CREATININE 2.21* 2.17*  CALCIUM 9.5 8.7   Liver Function Tests:  Recent Labs Lab 04/09/14 2010  AST 26  ALT 24  ALKPHOS 107  BILITOT 0.4  PROT 8.7*  ALBUMIN 2.9*   No results found for this basename: LIPASE, AMYLASE,  in the last 168 hours No results found for this basename: AMMONIA,  in the last 168 hours CBC:  Recent Labs Lab 04/09/14 2010 04/10/14 0650  WBC 8.7 6.3  NEUTROABS 5.9  --   HGB 12.2* 10.5*  HCT 34.9* 31.3*  MCV 82.1 83.9  PLT 202 153   Cardiac Enzymes: No results found for this basename: CKTOTAL, CKMB, CKMBINDEX, TROPONINI,  in the last 168 hours BNP (last 3 results) No results found for this basename: PROBNP,  in the last 8760 hours CBG:  Recent Labs Lab 04/10/14 0134 04/10/14 0754  GLUCAP 141* 121*    Recent Results (from the past 240 hour(s))  MRSA PCR SCREENING     Status: None   Collection Time    04/10/14  1:01 AM      Result Value Ref Range Status   MRSA by PCR NEGATIVE  NEGATIVE Final   Comment:            The GeneXpert MRSA Assay (FDA     approved for NASAL specimens     only), is one component of a  comprehensive MRSA colonization     surveillance program. It is not     intended to diagnose MRSA     infection nor to guide or     monitor treatment for     MRSA infections.     Studies: No results found.  Scheduled Meds: . amLODipine  10 mg Oral Daily  . cefTRIAXone (ROCEPHIN)  IV  1 g Intravenous Q24H  . enoxaparin (LOVENOX) injection  40 mg Subcutaneous Daily  . Everolimus  0.75 mg Oral QPM  . Everolimus  1 mg Oral Daily  . insulin aspart  0-5 Units Subcutaneous QHS  . insulin aspart  0-9 Units Subcutaneous TID WC  . insulin glargine  40 Units Subcutaneous QHS  . lisinopril  10 mg Oral Daily  . metoprolol  50 mg Oral BID  . multivitamin with minerals  1 tablet Oral Daily  . predniSONE  5 mg Oral Q breakfast  . sodium bicarbonate  1,300 mg Oral TID  . tamsulosin  0.4 mg Oral QPM  . valACYclovir  500 mg Oral  Daily  . zolpidem  5 mg Oral QHS   Continuous Infusions: . sodium chloride 75 mL/hr at 04/10/14 0139    Active Problems:   UTI (urinary tract infection)   Transplant recipient   Diabetes mellitus without complication   Hypertension   Smoker   Tachycardia    Time spent: >35 minutes     Kinnie Feil  [facility] Pager [phone]. If 7PM-7AM, please contact night-coverage at [email or web address], password [facility] 04/10/2014, 8:51 AM  LOS: 1 day

## 2014-04-10 NOTE — Progress Notes (Signed)
Utilization Review Completed.James [name] T9/19/2015  

## 2014-04-10 NOTE — Plan of Care (Signed)
Problem: Phase I Progression Outcomes Goal: Initial discharge plan identified Outcome: Completed/Met Date Met:  04/10/14 To return home

## 2014-04-10 NOTE — Progress Notes (Signed)
Attempted to get report from Jenny Reichmann, RN-requested to return call.

## 2014-04-11 DIAGNOSIS — N3000 Acute cystitis without hematuria: Secondary | ICD-10-CM

## 2014-04-11 LAB — URINE CULTURE
Colony Count: NO GROWTH
Culture: NO GROWTH

## 2014-04-11 LAB — GLUCOSE, CAPILLARY: Glucose-Capillary: 159 mg/dL — ABNORMAL HIGH (ref 70–99)

## 2014-04-11 MED ORDER — LEVOFLOXACIN 500 MG PO TABS: 500.0000 mg | ORAL_TABLET | Freq: Every day | ORAL | Status: DC

## 2014-04-11 NOTE — Discharge Instructions (Signed)
Please follow up with nephrologist next week

## 2014-04-11 NOTE — Progress Notes (Signed)
[facility] discharged Home per MD order.  Discharge instructions not  reviewed and discussed with the patient, he left before RN could review paperwork or signing. Pt. Took both copies of instructions, care notes given on diagnosis & new medication and script taken by patient also.    Medication List         amLODipine 10 MG tablet  Commonly known as:  NORVASC  Take 10 mg by mouth every morning.     Everolimus 0.75 MG Tabs  Take 0.75 mg by mouth every evening.     Everolimus 0.25 MG Tabs  Take 1 mg by mouth every morning. Takes along with the 0.75mg  tablet     insulin lispro 100 UNIT/ML injection  Commonly known as:  HUMALOG  Inject 15 Units into the skin 3 (three) times daily before meals.     LANTUS SOLOSTAR 100 UNIT/ML Solostar Pen  Generic drug:  Insulin Glargine  Inject 40 Units into the skin daily at 10 pm.     levofloxacin 500 MG tablet  Commonly known as:  LEVAQUIN  Take 1 tablet (500 mg total) by mouth daily.     lisinopril 5 MG tablet  Commonly known as:  PRINIVIL,ZESTRIL  Take 10 mg by mouth every morning.     metoprolol 50 MG tablet  Commonly known as:  LOPRESSOR  Take 50 mg by mouth 2 (two) times daily.     multivitamin with minerals tablet  Take 1 tablet by mouth every morning.     predniSONE 5 MG tablet  Commonly known as:  DELTASONE  Take 5 mg by mouth daily with breakfast.     sodium bicarbonate 650 MG tablet  Take 1,300 mg by mouth 3 (three) times daily.     tamsulosin 0.4 MG Caps capsule  Commonly known as:  FLOMAX  Take 0.4 mg by mouth every evening.     valACYclovir 500 MG tablet  Commonly known as:  VALTREX  Take 500 mg by mouth every morning.     zolpidem 5 MG tablet  Commonly known as:  AMBIEN  Take 5 mg by mouth at bedtime.        Patients skin is clean, dry and intact, no evidence of skin break down. IV site discontinued and catheter remains intact. Site without signs and symptoms of complications. Dressing and pressure  applied.  Patient ambulated to bus stop by himself & didn't let RN know he was leaving,  no distress noted upon discharge.  Wynetta Emery, Eryn Krejci C 04/11/2014 11:04 AM

## 2014-04-11 NOTE — Discharge Summary (Signed)
Physician Discharge Summary  James [name] [id number] DOB: 1971/07/18 DOA: 04/09/2014  PCP: No primary provider on file.  Admit date: 04/09/2014 Discharge date: 04/11/2014  Time spent: >35 minutes  Recommendations for Outpatient Follow-up:  F/u with nephrologist at [facility] next week (d/w patient) Discharge Diagnoses:  Active Problems:   UTI (urinary tract infection)   Transplant recipient   Diabetes mellitus without complication   Hypertension   Smoker   Tachycardia   Urinary tract infection   Discharge Condition: stable   Diet recommendation: low sodium, DM   Filed Weights   04/10/14 0042 04/10/14 0549 04/11/14 0542  Weight: 85.412 kg (188 lb 4.8 oz) 85.548 kg (188 lb 9.6 oz) 87.317 kg (192 lb 8 oz)    History of present illness:  43 y.o. Male with PMH of HTN, DM, BPH, CDK s/p cadaveric renal transplant 6 years ago who presents with the dysuria, chills is admitted with probable UTI  -Briefly reviewing patient's records from [facility] showed that his baseline creatinine is around 2.5. Of note, ED performed bedside ultrasound to evaluate for any hydronephrosis, they noted 100 cc of urine after post void residual.    Hospital Course:  1. Probably UTI; urine culture NGTD; no leukocytosis; afebrile; exam unremarkable; (d/w nephrologist over the phone); US renal ? cystitis  -cont atx for suspected UTI, recommended to f/u with transplant nephrologist at [place] next week  2. CKD s/p cadaveric renal transplant 6 years ago  -baseline Cr 2.5; f/u at [facility]  3. HTn, cont home regimen  4. DM, cont insulin regimen  5. BPH cont home regimen     Procedures:  none (i.e. Studies not automatically included, echos, thoracentesis, etc; not x-rays)  Consultations:  Nephrology over the phone   Discharge Exam: Filed Vitals:   04/11/14 0542  BP: 128/74  Pulse: 80  Temp: 97.7 F (36.5 C)  Resp: 20    General: alert Cardiovascular: s1,s2  rrr Respiratory: CTA BL  Discharge Instructions  Discharge Instructions   Diet - low sodium heart healthy    Complete by:  As directed      Discharge instructions    Complete by:  As directed   Please follow up with nephrologist  Next week     Increase activity slowly    Complete by:  As directed             Medication List         amLODipine 10 MG tablet  Commonly known as:  NORVASC  Take 10 mg by mouth every morning.     Everolimus 0.75 MG Tabs  Take 0.75 mg by mouth every evening.     Everolimus 0.25 MG Tabs  Take 1 mg by mouth every morning. Takes along with the 0.75mg  tablet     insulin lispro 100 UNIT/ML injection  Commonly known as:  HUMALOG  Inject 15 Units into the skin 3 (three) times daily before meals.     LANTUS SOLOSTAR 100 UNIT/ML Solostar Pen  Generic drug:  Insulin Glargine  Inject 40 Units into the skin daily at 10 pm.     levofloxacin 500 MG tablet  Commonly known as:  LEVAQUIN  Take 1 tablet (500 mg total) by mouth daily.     lisinopril 5 MG tablet  Commonly known as:  PRINIVIL,ZESTRIL  Take 10 mg by mouth every morning.     metoprolol 50 MG tablet  Commonly known as:  LOPRESSOR  Take 50 mg by  mouth 2 (two) times daily.     multivitamin with minerals tablet  Take 1 tablet by mouth every morning.     predniSONE 5 MG tablet  Commonly known as:  DELTASONE  Take 5 mg by mouth daily with breakfast.     sodium bicarbonate 650 MG tablet  Take 1,300 mg by mouth 3 (three) times daily.     tamsulosin 0.4 MG Caps capsule  Commonly known as:  FLOMAX  Take 0.4 mg by mouth every evening.     valACYclovir 500 MG tablet  Commonly known as:  VALTREX  Take 500 mg by mouth every morning.     zolpidem 5 MG tablet  Commonly known as:  AMBIEN  Take 5 mg by mouth at bedtime.       Allergies  Allergen Reactions  . Darvon [Propoxyphene]     Broke out on eyelid   . Tramadol     Rash      The results of significant diagnostics from this  hospitalization (including imaging, microbiology, ancillary and laboratory) are listed below for reference.    Significant Diagnostic Studies: US Renal  04/10/2014   CLINICAL DATA:  Hematuria. Prior renal transplant. Renal insufficiency with creatinine 2.17.  EXAM: RENAL/URINARY TRACT ULTRASOUND COMPLETE  COMPARISON:  None.  FINDINGS: Native Right Kidney:  Length: Approximately 6.2 cm. Atrophic with echogenic parenchyma. No visible masses.  Native Left Kidney:  Length: Approximately 6.2 cm. Atrophic with echogenic parenchyma. No visible masses.  Transplanted Kidney:  Length: Approximately 12.5 cm. No hydronephrosis. Well-preserved cortex. Normal parenchymal echotexture. No focal parenchymal abnormality. Patent renal artery and vein on color Doppler evaluation.  Bladder:  Ureteral jet from the transplanted kidney in the right side of the pelvis visualized color Doppler evaluation. Layering debris within the urinary bladder.  Other findings:  Upper normal sized prostate gland measuring approximately 3.6 x 3.8 x 4.7 cm.  IMPRESSION: 1. No focal abnormality involving the transplant kidney in the right side of the pelvis. No evidence of hydronephrosis. 2. Layering debris within the urinary bladder consistent with blood or pus. 3. Atrophic native kidneys. 4. Upper normal sized prostate gland.   Electronically Signed   By: Evangeline Dakin M.D.   On: 04/10/2014 12:23    Microbiology: Recent Results (from the past 240 hour(s))  URINE CULTURE     Status: None   Collection Time    04/09/14  8:13 PM      Result Value Ref Range Status   Specimen Description URINE, RANDOM   Final   Special Requests NONE   Final   Culture  Setup Time     Final   Value: 04/09/2014 21:18     Performed at [facility]     Final   Value: NO GROWTH     Performed at [facility]   Culture     Final   Value: NO GROWTH     Performed at [facility]   Report Status 04/11/2014 FINAL   Final   MRSA PCR SCREENING     Status: None   Collection Time    04/10/14  1:01 AM      Result Value Ref Range Status   MRSA by PCR NEGATIVE  NEGATIVE Final   Comment:            The GeneXpert MRSA Assay (FDA     approved for NASAL specimens     only), is one component of a  comprehensive MRSA colonization     surveillance program. It is not     intended to diagnose MRSA     infection nor to guide or     monitor treatment for     MRSA infections.     Labs: Basic Metabolic Panel:  Recent Labs Lab 04/09/14 2010 04/10/14 0650  NA 139 141  K 3.3* 3.5*  CL 104 110  CO2 19 18*  GLUCOSE 86 106*  BUN 18 21  CREATININE 2.21* 2.17*  CALCIUM 9.5 8.7   Liver Function Tests:  Recent Labs Lab 04/09/14 2010  AST 26  ALT 24  ALKPHOS 107  BILITOT 0.4  PROT 8.7*  ALBUMIN 2.9*   No results found for this basename: LIPASE, AMYLASE,  in the last 168 hours No results found for this basename: AMMONIA,  in the last 168 hours CBC:  Recent Labs Lab 04/09/14 2010 04/10/14 0650  WBC 8.7 6.3  NEUTROABS 5.9  --   HGB 12.2* 10.5*  HCT 34.9* 31.3*  MCV 82.1 83.9  PLT 202 153   Cardiac Enzymes: No results found for this basename: CKTOTAL, CKMB, CKMBINDEX, TROPONINI,  in the last 168 hours BNP: BNP (last 3 results) No results found for this basename: PROBNP,  in the last 8760 hours CBG:  Recent Labs Lab 04/10/14 0754 04/10/14 1206 04/10/14 1700 04/10/14 2143 04/11/14 0752  GLUCAP 121* 123* 194* 221* 159*       Signed:  Vannah Nadal N  [facility] 04/11/2014, 9:04 AM

## 2015-08-13 ENCOUNTER — Encounter (HOSPITAL_COMMUNITY): Payer: Self-pay | Admitting: Emergency Medicine

## 2015-08-13 ENCOUNTER — Inpatient Hospital Stay (HOSPITAL_COMMUNITY)
Admission: EM | Admit: 2015-08-13 | Discharge: 2015-08-16 | DRG: 872 | Disposition: A | Discharge status: Home / Self Care | Payer: Medicare Other | Attending: Internal Medicine | Admitting: Internal Medicine

## 2015-08-13 ENCOUNTER — Emergency Department (HOSPITAL_COMMUNITY): Payer: Medicare Other

## 2015-08-13 DIAGNOSIS — Z940 Kidney transplant status: Secondary | ICD-10-CM

## 2015-08-13 DIAGNOSIS — R197 Diarrhea, unspecified: Secondary | ICD-10-CM

## 2015-08-13 DIAGNOSIS — E861 Hypovolemia: Secondary | ICD-10-CM | POA: Diagnosis present

## 2015-08-13 DIAGNOSIS — Z79899 Other long term (current) drug therapy: Secondary | ICD-10-CM

## 2015-08-13 DIAGNOSIS — A021 Salmonella sepsis: Principal | ICD-10-CM | POA: Diagnosis present

## 2015-08-13 DIAGNOSIS — A4150 Gram-negative sepsis, unspecified: Secondary | ICD-10-CM | POA: Diagnosis present

## 2015-08-13 DIAGNOSIS — E860 Dehydration: Secondary | ICD-10-CM

## 2015-08-13 DIAGNOSIS — N179 Acute kidney failure, unspecified: Secondary | ICD-10-CM

## 2015-08-13 DIAGNOSIS — Z7952 Long term (current) use of systemic steroids: Secondary | ICD-10-CM

## 2015-08-13 DIAGNOSIS — F17210 Nicotine dependence, cigarettes, uncomplicated: Secondary | ICD-10-CM | POA: Diagnosis present

## 2015-08-13 DIAGNOSIS — Z794 Long term (current) use of insulin: Secondary | ICD-10-CM

## 2015-08-13 DIAGNOSIS — R6520 Severe sepsis without septic shock: Secondary | ICD-10-CM

## 2015-08-13 DIAGNOSIS — R0609 Other forms of dyspnea: Secondary | ICD-10-CM

## 2015-08-13 DIAGNOSIS — G4700 Insomnia, unspecified: Secondary | ICD-10-CM | POA: Diagnosis present

## 2015-08-13 DIAGNOSIS — R0602 Shortness of breath: Secondary | ICD-10-CM

## 2015-08-13 DIAGNOSIS — R338 Other retention of urine: Secondary | ICD-10-CM | POA: Diagnosis present

## 2015-08-13 DIAGNOSIS — N189 Chronic kidney disease, unspecified: Secondary | ICD-10-CM

## 2015-08-13 DIAGNOSIS — N401 Enlarged prostate with lower urinary tract symptoms: Secondary | ICD-10-CM | POA: Diagnosis present

## 2015-08-13 DIAGNOSIS — A020 Salmonella enteritis: Secondary | ICD-10-CM | POA: Diagnosis present

## 2015-08-13 DIAGNOSIS — R112 Nausea with vomiting, unspecified: Secondary | ICD-10-CM

## 2015-08-13 DIAGNOSIS — E1122 Type 2 diabetes mellitus with diabetic chronic kidney disease: Secondary | ICD-10-CM | POA: Diagnosis present

## 2015-08-13 DIAGNOSIS — E119 Type 2 diabetes mellitus without complications: Secondary | ICD-10-CM

## 2015-08-13 DIAGNOSIS — I10 Essential (primary) hypertension: Secondary | ICD-10-CM | POA: Diagnosis present

## 2015-08-13 DIAGNOSIS — R0600 Dyspnea, unspecified: Secondary | ICD-10-CM

## 2015-08-13 DIAGNOSIS — D638 Anemia in other chronic diseases classified elsewhere: Secondary | ICD-10-CM | POA: Diagnosis present

## 2015-08-13 DIAGNOSIS — T8610 Unspecified complication of kidney transplant: Secondary | ICD-10-CM

## 2015-08-13 DIAGNOSIS — N186 End stage renal disease: Secondary | ICD-10-CM | POA: Insufficient documentation

## 2015-08-13 LAB — RENAL FUNCTION PANEL
ALBUMIN: 2.8 g/dL — AB (ref 3.5–5.0)
Anion gap: 10 (ref 5–15)
BUN: 23 mg/dL — AB (ref 6–20)
CHLORIDE: 110 mmol/L (ref 101–111)
CO2: 17 mmol/L — ABNORMAL LOW (ref 22–32)
CREATININE: 3.8 mg/dL — AB (ref 0.61–1.24)
Calcium: 8.8 mg/dL — ABNORMAL LOW (ref 8.9–10.3)
GFR calc Af Amer: 21 mL/min — ABNORMAL LOW (ref >60)
GFR, EST NON AFRICAN AMERICAN: 18 mL/min — AB (ref >60)
Glucose, Bld: 94 mg/dL (ref 65–99)
Phosphorus: 2.6 mg/dL (ref 2.5–4.6)
Potassium: 4.2 mmol/L (ref 3.5–5.1)
Sodium: 137 mmol/L (ref 135–145)

## 2015-08-13 LAB — CBC WITH DIFFERENTIAL/PLATELET
BASOS ABS: 0 10*3/uL (ref 0.0–0.1)
BASOS PCT: 0 %
Eosinophils Absolute: 0 10*3/uL (ref 0.0–0.7)
Eosinophils Relative: 0 %
HCT: 33.7 % — ABNORMAL LOW (ref 39.0–52.0)
Hemoglobin: 11.3 g/dL — ABNORMAL LOW (ref 13.0–17.0)
Lymphocytes Relative: 20 %
Lymphs Abs: 1.8 10*3/uL (ref 0.7–4.0)
MCH: 31.6 pg (ref 26.0–34.0)
MCHC: 33.5 g/dL (ref 30.0–36.0)
MCV: 94.1 fL (ref 78.0–100.0)
Monocytes Absolute: 1 10*3/uL (ref 0.1–1.0)
Monocytes Relative: 11 %
NEUTROS ABS: 6.4 10*3/uL (ref 1.7–7.7)
Neutrophils Relative %: 69 %
PLATELETS: 182 10*3/uL (ref 150–400)
RBC: 3.58 MIL/uL — ABNORMAL LOW (ref 4.22–5.81)
RDW: 12.8 % (ref 11.5–15.5)
WBC: 9.2 10*3/uL (ref 4.0–10.5)

## 2015-08-13 LAB — COMPREHENSIVE METABOLIC PANEL
ALBUMIN: 3.4 g/dL — AB (ref 3.5–5.0)
ALT: 37 U/L (ref 17–63)
AST: 44 U/L — AB (ref 15–41)
Alkaline Phosphatase: 96 U/L (ref 38–126)
Anion gap: 14 (ref 5–15)
BILIRUBIN TOTAL: 0.5 mg/dL (ref 0.3–1.2)
BUN: 27 mg/dL — AB (ref 6–20)
CHLORIDE: 104 mmol/L (ref 101–111)
CO2: 17 mmol/L — ABNORMAL LOW (ref 22–32)
Calcium: 9.5 mg/dL (ref 8.9–10.3)
Creatinine, Ser: 4.13 mg/dL — ABNORMAL HIGH (ref 0.61–1.24)
GFR calc Af Amer: 19 mL/min — ABNORMAL LOW (ref >60)
GFR calc non Af Amer: 16 mL/min — ABNORMAL LOW (ref >60)
GLUCOSE: 102 mg/dL — AB (ref 65–99)
Potassium: 4.5 mmol/L (ref 3.5–5.1)
Sodium: 135 mmol/L (ref 135–145)
TOTAL PROTEIN: 7.9 g/dL (ref 6.5–8.1)

## 2015-08-13 LAB — URINALYSIS, ROUTINE W REFLEX MICROSCOPIC
BILIRUBIN URINE: NEGATIVE
Glucose, UA: NEGATIVE mg/dL
Ketones, ur: 15 mg/dL — AB
Leukocytes, UA: NEGATIVE
NITRITE: NEGATIVE
Specific Gravity, Urine: 1.013 (ref 1.005–1.030)
pH: 5.5 (ref 5.0–8.0)

## 2015-08-13 LAB — URINE MICROSCOPIC-ADD ON: WBC, UA: NONE SEEN WBC/hpf (ref 0–5)

## 2015-08-13 LAB — GLUCOSE, CAPILLARY
Glucose-Capillary: 91 mg/dL (ref 65–99)
Glucose-Capillary: 164 mg/dL — ABNORMAL HIGH (ref 65–99)

## 2015-08-13 LAB — C DIFFICILE QUICK SCREEN W PCR REFLEX
C DIFFICILE (CDIFF) INTERP: NEGATIVE
C DIFFICILE (CDIFF) TOXIN: NEGATIVE
C Diff antigen: NEGATIVE

## 2015-08-13 LAB — I-STAT CG4 LACTIC ACID, ED: Lactic Acid, Venous: 1.14 mmol/L (ref 0.5–2.0)

## 2015-08-13 MED ORDER — TAMSULOSIN HCL 0.4 MG PO CAPS
0.4000 mg | ORAL_CAPSULE | Freq: Every evening | ORAL | Status: DC
  Administered 2015-08-13 – 2015-08-15 (×3): 0.4 mg via ORAL
  Filled 2015-08-13 (×3): qty 1

## 2015-08-13 MED ORDER — ACETAMINOPHEN 325 MG PO TABS
650.0000 mg | ORAL_TABLET | Freq: Four times a day (QID) | ORAL | Status: DC | PRN
  Filled 2015-08-13: qty 2

## 2015-08-13 MED ORDER — TACROLIMUS 1 MG PO CAPS: 1.0000 mg | ORAL_CAPSULE | Freq: Every day | ORAL | Status: DC

## 2015-08-13 MED ORDER — ONDANSETRON HCL 4 MG/2ML IJ SOLN
4.0000 mg | Freq: Four times a day (QID) | INTRAMUSCULAR | Status: DC | PRN
  Administered 2015-08-13 – 2015-08-15 (×2): 4 mg via INTRAVENOUS
  Filled 2015-08-13: qty 2

## 2015-08-13 MED ORDER — PIPERACILLIN-TAZOBACTAM 3.375 G IVPB
3.3750 g | Freq: Three times a day (TID) | INTRAVENOUS | Status: DC
  Administered 2015-08-13 – 2015-08-16 (×9): 3.375 g via INTRAVENOUS
  Filled 2015-08-13 (×13): qty 50

## 2015-08-13 MED ORDER — ONDANSETRON HCL 4 MG/2ML IJ SOLN
4.0000 mg | Freq: Once | INTRAMUSCULAR | Status: AC
  Administered 2015-08-13: 4 mg via INTRAVENOUS
  Filled 2015-08-13: qty 2

## 2015-08-13 MED ORDER — PIPERACILLIN-TAZOBACTAM 3.375 G IVPB 30 MIN
3.3750 g | Freq: Once | INTRAVENOUS | Status: AC
  Administered 2015-08-13: 3.375 g via INTRAVENOUS
  Filled 2015-08-13: qty 50

## 2015-08-13 MED ORDER — HEPARIN SODIUM (PORCINE) 5000 UNIT/ML IJ SOLN
5000.0000 [IU] | Freq: Three times a day (TID) | INTRAMUSCULAR | Status: DC
Start: 2015-08-13 — End: 2015-08-16
  Administered 2015-08-13 – 2015-08-15 (×8): 5000 [IU] via SUBCUTANEOUS
  Filled 2015-08-13 (×8): qty 1

## 2015-08-13 MED ORDER — HYDROCORTISONE NA SUCCINATE PF 100 MG IJ SOLR
25.0000 mg | Freq: Three times a day (TID) | INTRAMUSCULAR | Status: DC
  Administered 2015-08-13 – 2015-08-16 (×8): 25 mg via INTRAVENOUS
  Filled 2015-08-13 (×8): qty 2

## 2015-08-13 MED ORDER — PREDNISONE 5 MG PO TABS: 5.0000 mg | ORAL_TABLET | Freq: Every day | ORAL | Status: DC

## 2015-08-13 MED ORDER — TACROLIMUS 0.5 MG PO CAPS
0.5000 mg | ORAL_CAPSULE | Freq: Every day | ORAL | Status: DC
  Administered 2015-08-14 – 2015-08-15 (×2): 0.5 mg via SUBLINGUAL
  Filled 2015-08-13 (×5): qty 1

## 2015-08-13 MED ORDER — TACROLIMUS 1 MG PO CAPS
2.0000 mg | ORAL_CAPSULE | Freq: Every day | ORAL | Status: DC
  Administered 2015-08-13: 2 mg via ORAL
  Filled 2015-08-13: qty 2

## 2015-08-13 MED ORDER — SODIUM CHLORIDE 0.9 % IV SOLN
INTRAVENOUS | Status: DC
  Administered 2015-08-13: 12:00:00 via INTRAVENOUS

## 2015-08-13 MED ORDER — INSULIN ASPART 100 UNIT/ML ~~LOC~~ SOLN: 0.0000 [IU] | Freq: Every day | SUBCUTANEOUS | Status: DC

## 2015-08-13 MED ORDER — SODIUM BICARBONATE 650 MG PO TABS
1300.0000 mg | ORAL_TABLET | Freq: Three times a day (TID) | ORAL | Status: DC
  Administered 2015-08-13 – 2015-08-16 (×8): 1300 mg via ORAL
  Filled 2015-08-13 (×10): qty 2

## 2015-08-13 MED ORDER — SODIUM CHLORIDE 0.9 % IV SOLN
8.0000 mg | Freq: Four times a day (QID) | INTRAVENOUS | Status: DC | PRN
  Filled 2015-08-13: qty 4

## 2015-08-13 MED ORDER — SODIUM CHLORIDE 0.9 % IV SOLN
INTRAVENOUS | Status: DC
  Administered 2015-08-13 – 2015-08-15 (×3): via INTRAVENOUS

## 2015-08-13 MED ORDER — ACETAMINOPHEN 325 MG PO TABS
650.0000 mg | ORAL_TABLET | Freq: Four times a day (QID) | ORAL | Status: DC | PRN
  Administered 2015-08-13 – 2015-08-15 (×3): 650 mg via ORAL
  Filled 2015-08-13 (×4): qty 2

## 2015-08-13 MED ORDER — TACROLIMUS 1 MG PO CAPS
1.0000 mg | ORAL_CAPSULE | Freq: Every day | ORAL | Status: DC
  Administered 2015-08-13 – 2015-08-15 (×3): 1 mg via SUBLINGUAL
  Filled 2015-08-13 (×4): qty 1

## 2015-08-13 MED ORDER — SODIUM CHLORIDE 0.9 % IV BOLUS (SEPSIS)
2000.0000 mL | Freq: Once | INTRAVENOUS | Status: AC
  Administered 2015-08-13: 2000 mL via INTRAVENOUS

## 2015-08-13 MED ORDER — ACETAMINOPHEN 325 MG PO TABS
ORAL_TABLET | ORAL | Status: AC
  Filled 2015-08-13: qty 2

## 2015-08-13 MED ORDER — INSULIN ASPART 100 UNIT/ML ~~LOC~~ SOLN
0.0000 [IU] | Freq: Three times a day (TID) | SUBCUTANEOUS | Status: DC
  Administered 2015-08-14: 4 [IU] via SUBCUTANEOUS
  Administered 2015-08-14 (×2): 3 [IU] via SUBCUTANEOUS
  Administered 2015-08-15: 4 [IU] via SUBCUTANEOUS
  Administered 2015-08-16: 3 [IU] via SUBCUTANEOUS

## 2015-08-13 MED ORDER — ACETAMINOPHEN 325 MG PO TABS
650.0000 mg | ORAL_TABLET | Freq: Once | ORAL | Status: AC | PRN
  Administered 2015-08-13: 650 mg via ORAL

## 2015-08-13 MED ORDER — ONDANSETRON HCL 4 MG/2ML IJ SOLN: 4.0000 mg | Freq: Four times a day (QID) | INTRAMUSCULAR | Status: DC | PRN

## 2015-08-13 MED ORDER — ADULT MULTIVITAMIN W/MINERALS CH
1.0000 | ORAL_TABLET | Freq: Every day | ORAL | Status: DC
  Administered 2015-08-13 – 2015-08-16 (×4): 1 via ORAL
  Filled 2015-08-13 (×4): qty 1

## 2015-08-13 MED ORDER — MULTI-VITAMIN/MINERALS PO TABS
1.0000 | ORAL_TABLET | ORAL | Status: DC
Start: 2015-08-14 — End: 2015-08-13

## 2015-08-13 MED ORDER — VALACYCLOVIR HCL 500 MG PO TABS
500.0000 mg | ORAL_TABLET | ORAL | Status: DC
  Administered 2015-08-14 – 2015-08-16 (×3): 500 mg via ORAL
  Filled 2015-08-13 (×3): qty 1

## 2015-08-13 MED ORDER — ZOLPIDEM TARTRATE 5 MG PO TABS
5.0000 mg | ORAL_TABLET | Freq: Every evening | ORAL | Status: DC | PRN
  Administered 2015-08-14 – 2015-08-16 (×2): 5 mg via ORAL
  Filled 2015-08-13 (×3): qty 1

## 2015-08-13 NOTE — ED Notes (Signed)
+   Blood cultures-gram neg rod, MD on call made aware.

## 2015-08-13 NOTE — H&P (Signed)
Date: 08/13/2015               Patient Name:  James [name] MRN: [id number]  DOB: September 17, 1970 Age / Sex: 45 y.o., male   PCP: No primary care provider on file.         Medical Service: Internal Medicine Teaching Service         Attending Physician: Dr. Sid Falcon, MD    First Contact: Dr. Liberty Handy Pager: [phone]  Second Contact: Dr. Julious Oka Pager: [phone]       After Hours (After 5p/  First Contact Pager: [phone]  weekends / holidays): Second Contact Pager: [phone]   Chief Complaint: Diarrhea  History of Present Illness:   [name] is a 45 year old with a past medical history of a deceased donor kidney transplant (03/2007, tacrolimus 2 mg AM, 1 mg PM, prednisone 5 mg, follows at [facility]), T2DM, HTN, and tobacco abuse who presents with diarrhea. He was in his normal state of health until last weekend (1/14) when he started to have episodes of vomiting and diarrhea. At first, the vomit was food he had eaten which transitioned to small yellow volumes. He has had no appetite and cannot keep anything down. As a result, he has not taken any immunosuppressant therapy, or any medications whatsoever, in at least a week. He has at least three watery, greenish, non-bloody bowel movements a day. He also endorse a fever (recorded 102F at home), chills, and night sweats. He also describes generalized weakness and light-headedness upon standing. He is able to make urine, albeit minimal in the setting of poor oral intake. He only drinks distilled water. 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 He denies any camping or recent travel. Otherwise he denies any dyspnea, chest pain, palpitations, blackouts, one-sided weakness, sore throat, cough, congestion, headaches, abdominal pain, constipation, blood in stool, skin changes, vision changes, or confusion. He smokes approximately 7 cigarettes a day. He does not use alcohol or  illicit drugs. He is on disability and lives alone in an apartment in [place]. He had wanted to go to [facility], where he receives care for his renal transplant, but the hospital was at capacity.  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 He has been on alternative regimens previously, including rituximab, sirolimus, and everolimus.  In the ED, the patient was tachycardic to 125, BP 117/79, with a temperature of 101.1. He was also noted to have a creatinine of 4.13 over his baseline of ~2.5. He had no leukocytosis or lactic acidosis.    Meds: Current Facility-Administered Medications  Medication Dose Route Frequency Provider Last Rate Last Dose  . 0.9 %  sodium chloride infusion   Intravenous Continuous Orlie Dakin, MD 250 mL/hr at 08/13/15 1408    . 0.9 %  sodium chloride infusion   Intravenous Continuous Norman Herrlich, MD      . heparin injection 5,000 Units  5,000 Units Subcutaneous 3 times per day Norman Herrlich, MD      . insulin aspart (novoLOG) injection 0-20 Units  0-20 Units Subcutaneous TID WC Norman Herrlich, MD      . insulin aspart (novoLOG)  injection 0-5 Units  0-5 Units Subcutaneous QHS Norman Herrlich, MD      . Derrill Memo ON 08/14/2015] multivitamin with minerals tablet 1 tablet  1 tablet Oral BH-q7a Norman Herrlich, MD      . Derrill Memo ON 08/14/2015] predniSONE (DELTASONE) tablet 5 mg  5 mg Oral Q breakfast Norman Herrlich, MD      . sodium bicarbonate tablet 1,300 mg  1,300 mg Oral TID Norman Herrlich, MD      . tacrolimus (PROGRAF) capsule 1-2 mg  1-2 mg Oral BID Norman Herrlich, MD      . tamsulosin [facility]) capsule 0.4 mg  0.4 mg Oral QPM Norman Herrlich, MD       . Derrill Memo ON 08/14/2015] valACYclovir (VALTREX) tablet 500 mg  500 mg Oral BH-q7a Norman Herrlich, MD      . zolpidem [facility]) tablet 5 mg  5 mg Oral QHS PRN Norman Herrlich, MD       Current Outpatient Prescriptions  Medication Sig Dispense Refill  . Insulin Glargine (LANTUS SOLOSTAR) 100 UNIT/ML Solostar Pen Inject 40 Units into the skin daily at 10 pm.    . insulin lispro (HUMALOG) 100 UNIT/ML injection Inject 15 Units into the skin 3 (three) times daily before meals.    Marland Kitchen lisinopril (PRINIVIL,ZESTRIL) 5 MG tablet Take 5 mg by mouth 2 (two) times daily.     . metoprolol tartrate (LOPRESSOR) 25 MG tablet Take 75 mg by mouth 2 (two) times daily.    . Multiple Vitamins-Minerals (MULTIVITAMIN WITH MINERALS) tablet Take 1 tablet by mouth every morning.    . predniSONE (DELTASONE) 5 MG tablet Take 5 mg by mouth daily with breakfast.    . sodium bicarbonate 650 MG tablet Take 1,300 mg by mouth 3 (three) times daily.    . tacrolimus (PROGRAF) 1 MG capsule Take 1-2 mg by mouth 2 (two) times daily. Take 2mg  in the morning and 1mg  in the evening.    . tamsulosin (FLOMAX) 0.4 MG CAPS capsule Take 0.4 mg by mouth every evening.    . valACYclovir (VALTREX) 500 MG tablet Take 500 mg by mouth every morning.    . zolpidem (AMBIEN) 5 MG tablet Take 5 mg by mouth at bedtime as needed for sleep.       Allergies: Allergies as of 08/13/2015 - Review Complete 08/13/2015  Allergen Reaction Noted  . Darvon [propoxyphene]  04/09/2014  . Tramadol  04/09/2014   Past Medical History  Diagnosis Date  . Renal disorder   . Hypertension   . Diabetes mellitus without complication ([place])   . Transplant recipient   . Smoker    Past Surgical History  Procedure Laterality Date  . Hip surgery    . Kidney transplant     Family History  Problem Relation Age of Onset  . Adopted: Yes   Social History   Social History  . Marital Status: Single    Spouse Name: N/A  . Number of Children: N/A  . Years of  Education: N/A   Occupational History  . Not on file.   Social History Main Topics  . Smoking status: Current Every Day Smoker -- 0.50 packs/day  . Smokeless tobacco: Not on file  . Alcohol Use: No  . Drug Use: No  . Sexual Activity: Not on file   Other Topics Concern  . Not on file   Social History Narrative    Review of Systems: All systems negative except per HPI.  Physical Exam: Blood pressure 129/90, pulse 118, temperature 102.8 F (39.3 C), temperature source Oral, resp. rate 26, height 5\' 7"  (1.702 m), weight 192 lb (87.091 kg), SpO2 94 %. General: Lying in bed, NAD HEENT: PERRL, no scleral icterus, dry mucous membranes, no tonsillar exudate or erythema, neck supple, no cervical adenopathy. Nose crusted at opening. Cardiovascular: Tachycardic. Regular rhythm. No murmurs, rubs, or gallops Pulmonary: Clear to auscultation bilaterally. Unlabored breathing Abdominal: Obese, soft, NT/ND. Hyperactive bowel sounds. Extremities: No clubbing, cyanosis, or edema Skin: Warm and dry. No rashes Neurological: Alert, Oriented to person, place, when asked year, he said "It's the year of Trump." Psychiatric: Normal behavior and affect   Lab results: Basic Metabolic Panel:  Recent Labs  08/13/15 0131  NA 135  K 4.5  CL 104  CO2 17*  GLUCOSE 102*  BUN 27*  CREATININE 4.13*  CALCIUM 9.5   Liver Function Tests:  Recent Labs  08/13/15 0131  AST 44*  ALT 37  ALKPHOS 96  BILITOT 0.5  PROT 7.9  ALBUMIN 3.4*   CBC:  Recent Labs  08/13/15 0131  WBC 9.2  NEUTROABS 6.4  HGB 11.3*  HCT 33.7*  MCV 94.1  PLT 182   Urinalysis:  Recent Labs  08/13/15 0129  COLORURINE YELLOW  LABSPEC 1.013  PHURINE 5.5  GLUCOSEU NEGATIVE  HGBUR MODERATE*  BILIRUBINUR NEGATIVE  KETONESUR 15*  PROTEINUR >300*  NITRITE NEGATIVE  LEUKOCYTESUR NEGATIVE   Imaging results:  Dg Chest 2 View  08/13/2015  CLINICAL DATA:  Subacute onset of fever, nausea, vomiting, diarrhea and  chills. Initial encounter. EXAM: CHEST  2 VIEW COMPARISON:  Chest radiograph performed 02/22/2004 FINDINGS: The lungs are well-aerated. Mild peribronchial thickening is noted. There is no evidence of focal opacification, pleural effusion or pneumothorax. The heart is normal in size; the mediastinal contour is within normal limits. No acute osseous abnormalities are seen. Clips are noted within the right upper quadrant, reflecting prior cholecystectomy. IMPRESSION: Mild peribronchial thickening noted.  Lungs otherwise clear. Electronically Signed   By: Garald Balding M.D.   On: 08/13/2015 01:29   Assessment & Plan by Problem: Principal Problem:   Diarrhea Active Problems:   Transplant recipient   Diabetes mellitus without complication ([place])   Hypertension   AKI (acute kidney injury) ([place])  Acute Diarrhea: Fever and emesis are suggestive of an infectious etiology. The time course is more consistent with a bacterial cause, but he lacks bloody diarrhea. Viral or parasitic cause is a strong consideration given his long-term immunosuppression. It's possible that a viral gastroenteritis, which may have self resolved in an immunocompetent individual, could have have a prolonged course in this patient [facility] et al., 2009). CMV colitis is also on the differential, given that he has not been able to take his valacyclovir prophylaxis - GI pathogen panel to query parasitic, viral, or bacterial causes - C. Diff assay - Consider empiric ciprofloxacin, metronidazole, and/or nitazoxanide if symptoms persist  Westhoff, T. H., Vergoulidou, M., Etheleen Sia, T., ... & Pricilla Loveless Giet, M. (2009). 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 Nephrology Dialysis Transplantation, 24(3), [phone].  AKI in setting of Renal Transplant on Immunosuppression: The differential includes pre-renal in setting of poor oral intake, tacrolimus toxicity (although has not been  taking), graft rejection (has not been taking immunosuppressants), BK nephropathy (none detected in 06/2015). Will treat as pre-renal and investigate other causes if renal function does not reach baseline. Nephrology has been consulted and [facility]  is aware he is at [facility]. - NS 150 cc/hr - Orthostatic Vital Signs - Tacrolimus level - Tacrolimus 2 mg po in Am, 1 mg po in PO - Valacyclovir 500 mg daily for CMV prophylaxis - Prednisone 5 mg daily - Sodium bicarbonate 1.3 g TID - RFP in AM  T2DM: Glucose 102 with poor oral intake. On 40U Lantus QHS with Humalog 15U TID before meals at home. - Resistant SSI with HS correction only  HTN: 129/90. On Metoprolol 75 mg BID and lisinopril 5 mg BID at home. - Holding antihypertensives for now  BPH: Continue home tamsulosin 0.4 mg qhs  Insomnia: Continue home zolpidem 5 mg nightly prn.  DVT Prophylaxis: Heparin [place] Diet: Renal/Carb-modified Code Status: Full  Dispo: Disposition is deferred at this time, awaiting improvement of current medical problems. Anticipated discharge in approximately 1-2 day(s).   The patient does not have a current PCP (No primary care provider on file.) and does not need an [facility] hospital follow-up appointment after discharge.  The patient does have transportation limitations that hinder transportation to clinic appointments.  Signed: Liberty Handy, MD 08/13/2015, 2:26 PM

## 2015-08-13 NOTE — ED Provider Notes (Addendum)
CSN: [id number]     Arrival date & time 08/13/15  0053 History  By signing my name below, I, James [name], attest that this documentation has been prepared under the direction and in the presence of James Schmidt, MD. Electronically Signed: Altamease [name], ED Scribe. 08/13/2015. 3:14 AM     Chief Complaint  Patient presents with  . Fever  . Abdominal Pain  . Emesis    The history is provided by the patient. No language interpreter was used.   Brought in by EMS, James [name] is a 45 y.o. male with history of HTN, DM, renal disorder s/p transplant at [facility] 8 years ago, and post-transplant lymphoma who presents to the Emergency Department complaining of n/v/d with onset 6 days ago. Pt states that he has been unable to hold down food or fluids.  Associated symptoms include fever, lower abdominal pain that has resolved. Pt denies dysuria, urinary frequency, and pain over the transplant. His nephrologist and transplant team are at [facility] but he missed his scheduled appointment there yesterday.  Past Medical History  Diagnosis Date  . Renal disorder   . Hypertension   . Diabetes mellitus without complication ([place])   . Transplant recipient   . Smoker    Past Surgical History  Procedure Laterality Date  . Hip surgery    . Kidney transplant     Family History  Problem Relation Age of Onset  . Adopted: Yes   Social History  Substance Use Topics  . Smoking status: Current Every Day Smoker -- 0.50 packs/day  . Smokeless tobacco: None  . Alcohol Use: No    Review of Systems 10 Systems reviewed and all are negative for acute change except as noted in the HPI.   Allergies  Darvon and Tramadol  Home Medications   Prior to Admission medications   Medication Sig Start Date End Date Taking? Authorizing Provider  Insulin Glargine (LANTUS SOLOSTAR) 100 UNIT/ML Solostar Pen Inject 40 Units into the skin daily at 10 pm.   Yes Historical Provider, MD  insulin lispro  (HUMALOG) 100 UNIT/ML injection Inject 15 Units into the skin 3 (three) times daily before meals.   Yes Historical Provider, MD  lisinopril (PRINIVIL,ZESTRIL) 5 MG tablet Take 5 mg by mouth 2 (two) times daily.    Yes Historical Provider, MD  metoprolol tartrate (LOPRESSOR) 25 MG tablet Take 75 mg by mouth 2 (two) times daily.   Yes Historical Provider, MD  Multiple Vitamins-Minerals (MULTIVITAMIN WITH MINERALS) tablet Take 1 tablet by mouth every morning.   Yes Historical Provider, MD  predniSONE (DELTASONE) 5 MG tablet Take 5 mg by mouth daily with breakfast.   Yes Historical Provider, MD  sodium bicarbonate 650 MG tablet Take 1,300 mg by mouth 3 (three) times daily.   Yes Historical Provider, MD  tacrolimus (PROGRAF) 1 MG capsule Take 1-2 mg by mouth 2 (two) times daily. Take 2mg  in the morning and 1mg  in the evening.   Yes Historical Provider, MD  tamsulosin (FLOMAX) 0.4 MG CAPS capsule Take 0.4 mg by mouth every evening.   Yes Historical Provider, MD  valACYclovir (VALTREX) 500 MG tablet Take 500 mg by mouth every morning.   Yes Historical Provider, MD  zolpidem (AMBIEN) 5 MG tablet Take 5 mg by mouth at bedtime as needed for sleep.    Yes Historical Provider, MD   BP 117/79 mmHg  Pulse 125  Temp(Src) 100.6 F (38.1 C) (Oral)  Resp 16  Ht 5\' 7"  (  1.702 m)  Wt 192 lb (87.091 kg)  BMI 30.06 kg/m2  SpO2 98% Physical Exam  Constitutional: He is oriented to person, place, and time. He appears well-developed and well-nourished.  HENT:  Head: Normocephalic and atraumatic.  Eyes: EOM are normal.  Neck: Normal range of motion.  Cardiovascular: Normal rate, regular rhythm, normal heart sounds and intact distal pulses.   Pulmonary/Chest: Effort normal and breath sounds normal. No respiratory distress.  Abdominal: Soft. He exhibits no distension. There is no tenderness.  Musculoskeletal: Normal range of motion.  Neurological: He is alert and oriented to person, place, and time.  Skin: Skin is  warm and dry.  Psychiatric: He has a normal mood and affect. Judgment normal.  Nursing note and vitals reviewed.   ED Course  Procedures (including critical care time) DIAGNOSTIC STUDIES: Oxygen Saturation is 98% on RA,  normal by my interpretation.    COORDINATION OF CARE: 3:12 AM Discussed treatment plan which includes CXR, lab work, Zofran, Tylenol, and IVF with pt at bedside and pt agreed to plan.  Labs Review Labs Reviewed  COMPREHENSIVE METABOLIC PANEL - Abnormal; Notable for the following:    CO2 17 (*)    Glucose, Bld 102 (*)    BUN 27 (*)    Creatinine, Ser 4.13 (*)    Albumin 3.4 (*)    AST 44 (*)    GFR calc non Af Amer 16 (*)    GFR calc Af Amer 19 (*)    All other components within normal limits  CBC WITH DIFFERENTIAL/PLATELET - Abnormal; Notable for the following:    RBC 3.58 (*)    Hemoglobin 11.3 (*)    HCT 33.7 (*)    All other components within normal limits  URINALYSIS, ROUTINE W REFLEX MICROSCOPIC (NOT AT [facility]) - Abnormal; Notable for the following:    Hgb urine dipstick MODERATE (*)    Ketones, ur 15 (*)    Protein, ur >300 (*)    All other components within normal limits  URINE MICROSCOPIC-ADD ON - Abnormal; Notable for the following:    Squamous Epithelial / LPF 0-5 (*)    Bacteria, UA RARE (*)    All other components within normal limits  CULTURE, BLOOD (ROUTINE X 2)  URINE CULTURE  CULTURE, BLOOD (ROUTINE X 2)  I-STAT CG4 LACTIC ACID, ED   BUN  Date Value Ref Range Status  08/13/2015 27* 6 - 20 mg/dL Final  04/10/2014 21 6 - 23 mg/dL Final  04/09/2014 18 6 - 23 mg/dL Final  08/21/2010 14 6 - 23 mg/dL Final   CREATININE, SER  Date Value Ref Range Status  08/13/2015 4.13* 0.61 - 1.24 mg/dL Final  04/10/2014 2.17* 0.50 - 1.35 mg/dL Final  04/09/2014 2.21* 0.50 - 1.35 mg/dL Final  08/21/2010 1.36 0.4 - 1.5 mg/dL Final       Imaging Review Dg Chest 2 View  08/13/2015  CLINICAL DATA:  Subacute onset of fever, nausea, vomiting,  diarrhea and chills. Initial encounter. EXAM: CHEST  2 VIEW COMPARISON:  Chest radiograph performed 02/22/2004 FINDINGS: The lungs are well-aerated. Mild peribronchial thickening is noted. There is no evidence of focal opacification, pleural effusion or pneumothorax. The heart is normal in size; the mediastinal contour is within normal limits. No acute osseous abnormalities are seen. Clips are noted within the right upper quadrant, reflecting prior cholecystectomy. IMPRESSION: Mild peribronchial thickening noted.  Lungs otherwise clear. Electronically Signed   By: Garald Balding M.D.   On: 08/13/2015 01:29  I have personally reviewed and evaluated these images and lab results as part of my medical decision-making.   EKG Interpretation None      MDM   Final diagnoses:  Nausea vomiting and diarrhea  AKI (acute kidney injury) ([place])  Dehydration  Renal transplant recipient   No abdominal tenderness. WBC normal. Pt with acute kidney injury.  Patient would prefer to be transferred to [place] where his transplant team and transplant nephrologist R.  IV hydration here in the emergency department.  Treated with Zofran.  Patient with fever 101.1.  Denies blood in the stool.  Tachycardic.  Patient will be transferred to [facility].  4:30 AM Spoke with Dr Ouida Sills of Nephrology at [facility] who accepts the patient in transfer  I personally performed the services described in this documentation, which was scribed in my presence. The recorded information has been reviewed and is accurate.       James Schmidt, MD 08/13/15 [place], MD 08/13/15 0430

## 2015-08-13 NOTE — Progress Notes (Signed)
Temperature 100.4 oral.  MD notified for prn.

## 2015-08-13 NOTE — Progress Notes (Signed)
No pork with meals per patient.  Added no pork to diet.

## 2015-08-13 NOTE — ED Notes (Signed)
Pt to ED via [facility]> C/o fever x 2 weeks with nausea, vomiting, and intermittent mid abd pain.  Denies pain at present.  Pt hasn't taken anything for fever.

## 2015-08-13 NOTE — Progress Notes (Signed)
Patient arrived on unit from ED. No family at bedside.  

## 2015-08-13 NOTE — ED Provider Notes (Signed)
10:40 AM patient is resting comfortably. He complains of vomiting and diarrhea and diffuse abdominal pain onset 7 days ago. Last episode of vomiting was several days ago, he does not remember exactly. He's had 3 episodes of diarrhea this morning. He is pain-free at present. On exam alert Glasgow Coma Score 15 lungs clear auscultation abdomen nondistended normoactive bowel sounds nontender. Genitalia normal male. It was intended that patient get transferred to [facility]. However no bed available there. I spoke with Dr.Sheikh from nephrology service at [place] to admit patient here. She suggests consult with nephrology service to follow Prograf levels. If symptoms persist he should be started on Cipro and Flagyl. Acute kidney injury is likely secondary to dehydration I consulted [facility] internal medicine service will arrange for inpatient stay here  Orlie Dakin, MD 08/13/15 1214

## 2015-08-13 NOTE — Consult Note (Addendum)
Renal Service Consult Note [facility] 08/13/2015 James [name] D Requesting Physician:  Dr Daryll Drown  Reason for Consult:  ESRD James [name] pt with fever and diarrhea, [name]  HPI: The patient is a 45 y.o. year-old with hx of HTN/ DM and ESRD s/p renal transplant presenting to ED with stomach cramps, N/V/diarrhea, fevers and night sweats for the last approx 6 days.  He called EMS and was brought to ED where creat was up at 4.13 and so patient was admitted for a/c renal failure in setting of likely dehydration.  Right now patient is not having much nausea. He feels a bit tired and weak.  Diarrhea has continued in the hospital. Started on zosyn IV and IVF's at 150 cc/hr.  BP's are normal, Tmax 103.8 and HR 115.  R 24.    No cough, CP, SOB, edema. No rash or HA, blurred vision. No bloody stool. Some abd pain when "this thing first started", a sharp pain in lower mid-abdomen, which has resolved.  No joint pain.   ESRD due to HTN started HD in 2003, got HD in [place] until about 2006-07 when he was discharged [facility] practice. He then got HD in [facility] until getting a renal transplant in 2009 at [facility], [place] with baseline creat 2.1 most recently.  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     Past Medical History  Past Medical History  Diagnosis Date  . Renal disorder   . Hypertension   . Diabetes mellitus without complication ([place])   . Transplant recipient   . Smoker    Past Surgical History  Past Surgical History  Procedure Laterality Date  . Hip surgery    . Kidney transplant     Family History  Family History  Problem Relation Age of Onset  . Adopted: Yes   Social History  reports that he has been smoking.  He does not have any smokeless tobacco history on file. He reports that he does not drink  alcohol or use illicit drugs. Allergies  Allergies  Allergen Reactions  . Darvon [Propoxyphene]     Broke out on eyelid   . Tramadol     Rash   Home medications Prior to Admission medications   Medication Sig Start Date End Date Taking? Authorizing Provider  Insulin Glargine (LANTUS SOLOSTAR) 100 UNIT/ML Solostar Pen Inject 40 Units into the skin daily at 10 pm.   Yes Historical Provider, MD  insulin lispro (HUMALOG) 100 UNIT/ML injection Inject 15 Units into the skin 3 (three) times daily before meals.   Yes Historical Provider, MD  lisinopril (PRINIVIL,ZESTRIL) 5 MG tablet Take 5 mg by mouth 2 (two) times daily.    Yes Historical Provider, MD  metoprolol tartrate (LOPRESSOR) 25 MG tablet Take 75 mg by mouth 2 (two) times daily.   Yes Historical Provider, MD  Multiple Vitamins-Minerals (MULTIVITAMIN WITH MINERALS) tablet Take 1 tablet by mouth every morning.   Yes Historical Provider, MD  predniSONE (DELTASONE) 5 MG tablet Take 5 mg by mouth daily with breakfast.   Yes Historical Provider, MD  sodium bicarbonate 650 MG tablet Take 1,300 mg by mouth 3 (three) times daily.   Yes Historical Provider, MD  tacrolimus (PROGRAF) 1 MG capsule Take 1-2 mg by mouth 2 (two) times daily. Take  2mg  in the morning and 1mg  in the evening.   Yes Historical Provider, MD  tamsulosin (FLOMAX) 0.4 MG CAPS capsule Take 0.4 mg by mouth every evening.   Yes Historical Provider, MD  valACYclovir (VALTREX) 500 MG tablet Take 500 mg by mouth every morning.   Yes Historical Provider, MD  zolpidem (AMBIEN) 5 MG tablet Take 5 mg by mouth at bedtime as needed for sleep.    Yes Historical Provider, MD   Liver Function Tests  Recent Labs Lab 08/13/15 0131  AST 44*  ALT 37  ALKPHOS 96  BILITOT 0.5  PROT 7.9  ALBUMIN 3.4*   No results for input(s): LIPASE, AMYLASE in the last 168 hours. CBC  Recent Labs Lab 08/13/15 0131  WBC 9.2  NEUTROABS 6.4  HGB 11.3*  HCT 33.7*  MCV 94.1  PLT [id number]   Basic  Metabolic Panel  Recent Labs Lab 08/13/15 0131  NA 135  K 4.5  CL 104  CO2 17*  GLUCOSE 102*  BUN 27*  CREATININE 4.13*  CALCIUM 9.5    Filed Vitals:   08/13/15 1145 08/13/15 1600 08/13/15 1615 08/13/15 1656  BP: 129/90 123/82 127/90   Pulse: 118 117 115   Temp:    100.4 F (38 C)  TempSrc:    Oral  Resp: 26 24 29    Height:      Weight:      SpO2: 94% 98% 95%    Exam Alert no distress, warm to touch No rash, cyanosis or gangrene Sclera anicteric, throat clear No jvd Chest clear bilat no rales/ wheezing RRR no mrg Abd soft RLQ transplant non-tender no ascites +bs GU normal male Ext no edema/ wounds/ ulcers Nuero is alert nf ox 3   Assessment: 1 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   2 Diarrhea/ vomiting/ fever - prolonged illness, on emp abx IV zosyn per primary, cx's pending 3 Vol depletion 4 DM2 5 HTN on MTP and lisinopril at home. Holding here. Avoid ACEi altogether w AKI.    Plan - as above, will follow. Urine lytes. Transplant [place]. Will d/w pharmacy another way to get prograf in bloodstream besides pills.  And changed pred to IV HC.   Kelly Splinter MD [employer] pager [phone]    cell [phone] 08/13/2015, 5:21 PM

## 2015-08-13 NOTE — Progress Notes (Signed)
ANTIBIOTIC CONSULT NOTE - INITIAL  Pharmacy Consult for zosyn Indication: rule out sepsis  Allergies  Allergen Reactions  . Darvon [Propoxyphene]     Broke out on eyelid   . Tramadol     Rash    Patient Measurements: Height: 5\' 7"  (170.2 cm) Weight: 192 lb (87.091 kg) IBW/kg (Calculated) : 66.1 Adjusted Body Weight:   Vital Signs: Temp: 102.8 F (39.3 C) (01/21 1031) Temp Source: Oral (01/21 1031) BP: 129/90 mmHg (01/21 1145) Pulse Rate: 118 (01/21 1145) Intake/Output from previous day:   Intake/Output from this shift: Total I/O In: -  Out: 200 [Urine:200]  Labs:  Recent Labs  08/13/15 0131  WBC 9.2  HGB 11.3*  PLT 182  CREATININE 4.13*   Estimated Creatinine Clearance: 24.6 mL/min (by C-G formula based on Cr of 4.13). No results for input(s): VANCOTROUGH, VANCOPEAK, VANCORANDOM, GENTTROUGH, GENTPEAK, GENTRANDOM, TOBRATROUGH, TOBRAPEAK, TOBRARND, AMIKACINPEAK, AMIKACINTROU, AMIKACIN in the last 72 hours.   Microbiology: Recent Results (from the past 720 hour(s))  Culture, blood (routine x 2)     Status: None (Preliminary result)   Collection Time: 08/13/15  1:15 AM  Result Value Ref Range Status   Specimen Description BLOOD RIGHT ARM  Final   Special Requests   Final    BOTTLES DRAWN AEROBIC AND ANAEROBIC 5ML ANA 10ML AER   Culture PENDING  Incomplete   Report Status PENDING  Incomplete  Culture, blood (routine x 2)     Status: None (Preliminary result)   Collection Time: 08/13/15  1:21 AM  Result Value Ref Range Status   Specimen Description BLOOD RIGHT HAND  Final   Special Requests IN PEDIATRIC BOTTLE 5ML  Final   Culture  Setup Time   Final    GRAM NEGATIVE RODS IN PED BOTTLE CRITICAL RESULT CALLED TO, READ BACK BY AND VERIFIED WITH: RN [facility]    Culture PENDING  Incomplete   Report Status PENDING  Incomplete  C difficile quick scan w PCR reflex     Status: None   Collection Time: 08/13/15  2:27 PM  Result Value Ref Range Status   C Diff  antigen NEGATIVE NEGATIVE Final   C Diff toxin NEGATIVE NEGATIVE Final   C Diff interpretation Negative for toxigenic C. difficile  Final    Medical History: Past Medical History  Diagnosis Date  . Renal disorder   . Hypertension   . Diabetes mellitus without complication ([place])   . Transplant recipient   . Smoker     Medications:  Anti-infectives    Start     Dose/Rate Route Frequency Ordered Stop   08/14/15 0700  valACYclovir (VALTREX) tablet 500 mg     500 mg Oral BH-each morning 08/13/15 1317     08/13/15 2230  piperacillin-tazobactam (ZOSYN) IVPB 3.375 g     3.375 g 12.5 mL/hr over 240 Minutes Intravenous Every 8 hours 08/13/15 1612     08/13/15 1630  piperacillin-tazobactam (ZOSYN) IVPB 3.375 g     3.375 g 100 mL/hr over 30 Minutes Intravenous  Once 08/13/15 1612       Assessment: 49 yom presented to the ED with diarrhea. Found to have positive blood cultures. To start empiric zosyn. Tmax is 101.1 and WBC is WNL. Scr is elevated at 4.13. Lactic acid is 1.14.  Zosyn 1/21>>  1/21 Blood - GNR  1/21 Cdiff - NEG    Goal of Therapy:  Eradication of infection  Plan:  - Zosyn 3.375gm IV x 1 over 30 minutes then  3.375gm IV Q8H (4 hr inf) - F/u renal fxn, C&S, clinical status   Novelle Addair, Rande Lawman 08/13/2015,4:12 PM

## 2015-08-13 NOTE — ED Notes (Addendum)
RN walked in to find a very upset pt.  He asked if he was going to be in dialysis in the morning.  RN allowed pt to express fears and concerns then encouraged pt to wait for the results before worrying.

## 2015-08-14 ENCOUNTER — Observation Stay (HOSPITAL_COMMUNITY): Payer: Medicare Other

## 2015-08-14 DIAGNOSIS — I10 Essential (primary) hypertension: Secondary | ICD-10-CM | POA: Diagnosis present

## 2015-08-14 DIAGNOSIS — N189 Chronic kidney disease, unspecified: Secondary | ICD-10-CM

## 2015-08-14 DIAGNOSIS — R6520 Severe sepsis without septic shock: Secondary | ICD-10-CM

## 2015-08-14 DIAGNOSIS — R197 Diarrhea, unspecified: Secondary | ICD-10-CM | POA: Diagnosis present

## 2015-08-14 DIAGNOSIS — A4150 Gram-negative sepsis, unspecified: Secondary | ICD-10-CM

## 2015-08-14 DIAGNOSIS — R7881 Bacteremia: Secondary | ICD-10-CM | POA: Diagnosis not present

## 2015-08-14 DIAGNOSIS — R338 Other retention of urine: Secondary | ICD-10-CM | POA: Diagnosis present

## 2015-08-14 DIAGNOSIS — F17200 Nicotine dependence, unspecified, uncomplicated: Secondary | ICD-10-CM

## 2015-08-14 DIAGNOSIS — Z940 Kidney transplant status: Secondary | ICD-10-CM | POA: Diagnosis not present

## 2015-08-14 DIAGNOSIS — A020 Salmonella enteritis: Secondary | ICD-10-CM | POA: Diagnosis present

## 2015-08-14 DIAGNOSIS — T8610 Unspecified complication of kidney transplant: Secondary | ICD-10-CM | POA: Insufficient documentation

## 2015-08-14 DIAGNOSIS — A021 Salmonella sepsis: Secondary | ICD-10-CM | POA: Diagnosis present

## 2015-08-14 DIAGNOSIS — E861 Hypovolemia: Secondary | ICD-10-CM | POA: Diagnosis present

## 2015-08-14 DIAGNOSIS — N179 Acute kidney failure, unspecified: Secondary | ICD-10-CM | POA: Diagnosis present

## 2015-08-14 DIAGNOSIS — R112 Nausea with vomiting, unspecified: Secondary | ICD-10-CM | POA: Insufficient documentation

## 2015-08-14 DIAGNOSIS — N401 Enlarged prostate with lower urinary tract symptoms: Secondary | ICD-10-CM | POA: Diagnosis present

## 2015-08-14 DIAGNOSIS — E119 Type 2 diabetes mellitus without complications: Secondary | ICD-10-CM

## 2015-08-14 DIAGNOSIS — Z794 Long term (current) use of insulin: Secondary | ICD-10-CM | POA: Diagnosis not present

## 2015-08-14 DIAGNOSIS — Z79899 Other long term (current) drug therapy: Secondary | ICD-10-CM | POA: Diagnosis not present

## 2015-08-14 DIAGNOSIS — A09 Infectious gastroenteritis and colitis, unspecified: Secondary | ICD-10-CM | POA: Diagnosis not present

## 2015-08-14 DIAGNOSIS — Z7952 Long term (current) use of systemic steroids: Secondary | ICD-10-CM | POA: Diagnosis not present

## 2015-08-14 DIAGNOSIS — D638 Anemia in other chronic diseases classified elsewhere: Secondary | ICD-10-CM | POA: Diagnosis present

## 2015-08-14 DIAGNOSIS — E1122 Type 2 diabetes mellitus with diabetic chronic kidney disease: Secondary | ICD-10-CM | POA: Diagnosis present

## 2015-08-14 DIAGNOSIS — F17210 Nicotine dependence, cigarettes, uncomplicated: Secondary | ICD-10-CM | POA: Diagnosis present

## 2015-08-14 DIAGNOSIS — G4700 Insomnia, unspecified: Secondary | ICD-10-CM | POA: Diagnosis present

## 2015-08-14 LAB — CBC
HEMATOCRIT: 27.1 % — AB (ref 39.0–52.0)
HEMOGLOBIN: 9.3 g/dL — AB (ref 13.0–17.0)
MCH: 31.8 pg (ref 26.0–34.0)
MCHC: 34.3 g/dL (ref 30.0–36.0)
MCV: 92.8 fL (ref 78.0–100.0)
PLATELETS: 145 10*3/uL — AB (ref 150–400)
RBC: 2.92 MIL/uL — AB (ref 4.22–5.81)
RDW: 13 % (ref 11.5–15.5)
WBC: 4.2 10*3/uL (ref 4.0–10.5)

## 2015-08-14 LAB — RENAL FUNCTION PANEL
Albumin: 2.8 g/dL — ABNORMAL LOW (ref 3.5–5.0)
Anion gap: 12 (ref 5–15)
BUN: 17 mg/dL (ref 6–20)
CHLORIDE: 111 mmol/L (ref 101–111)
CO2: 17 mmol/L — ABNORMAL LOW (ref 22–32)
CREATININE: 3.54 mg/dL — AB (ref 0.61–1.24)
Calcium: 9 mg/dL (ref 8.9–10.3)
GFR calc Af Amer: 23 mL/min — ABNORMAL LOW (ref >60)
GFR, EST NON AFRICAN AMERICAN: 20 mL/min — AB (ref >60)
GLUCOSE: 130 mg/dL — AB (ref 65–99)
Phosphorus: 2.7 mg/dL (ref 2.5–4.6)
Potassium: 4.1 mmol/L (ref 3.5–5.1)
Sodium: 140 mmol/L (ref 135–145)

## 2015-08-14 LAB — GLUCOSE, CAPILLARY
GLUCOSE-CAPILLARY: 129 mg/dL — AB (ref 65–99)
GLUCOSE-CAPILLARY: 157 mg/dL — AB (ref 65–99)
Glucose-Capillary: 130 mg/dL — ABNORMAL HIGH (ref 65–99)
Glucose-Capillary: 136 mg/dL — ABNORMAL HIGH (ref 65–99)

## 2015-08-14 LAB — SODIUM, URINE, RANDOM: Sodium, Ur: 105 mmol/L

## 2015-08-14 LAB — CREATININE, URINE, RANDOM: Creatinine, Urine: 179.3 mg/dL

## 2015-08-14 MED ORDER — PANTOPRAZOLE SODIUM 40 MG PO TBEC
80.0000 mg | DELAYED_RELEASE_TABLET | Freq: Every day | ORAL | Status: DC
  Administered 2015-08-14 – 2015-08-16 (×3): 80 mg via ORAL
  Filled 2015-08-14 (×3): qty 2

## 2015-08-14 NOTE — Progress Notes (Addendum)
  [facility] Progress Note   Subjective: feels better, diarrhea/ nausea improving, had one episode chills last night, then none further which per him is an improvement  Filed Vitals:   08/13/15 1656 08/13/15 2011 08/14/15 0423 08/14/15 0933  BP:  143/78 127/84 134/80  Pulse:  105 87 85  Temp: 100.4 F (38 C) 99.2 F (37.3 C) 98 F (36.7 C) 97.9 F (36.6 C)  TempSrc: Oral  Oral Oral  Resp:  21 23 22   Height:      Weight:  66.2 kg (145 lb 15.1 oz)    SpO2:  98% 99% 99%    Inpatient medications: . heparin  5,000 Units Subcutaneous 3 times per day  . hydrocortisone sod succinate (SOLU-CORTEF) inj  25 mg Intravenous Q8H  . insulin aspart  0-20 Units Subcutaneous TID WC  . insulin aspart  0-5 Units Subcutaneous QHS  . multivitamin with minerals  1 tablet Oral Daily  . piperacillin-tazobactam (ZOSYN)  IV  3.375 g Intravenous Q8H  . sodium bicarbonate  1,300 mg Oral TID  . tacrolimus  0.5 mg Sublingual QHS  . tacrolimus  1 mg Sublingual Daily  . tamsulosin  0.4 mg Oral QPM  . valACYclovir  500 mg Oral BH-q7a   . 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 Labs pending today.  2 Diarrhea/ vomiting/ fever - prolonged illness, on emp abx IV zosyn per primary, cx's pending 3 Vol depletion - on 150/ hr IVF 4 DM2 5 HTN holding acei/ BB 6 Renal Tx - getting SL prograf now d/t vomiting, will switch back when GI better. IV steroids for now.   Plan - f/u labs today, cont IVF's. Had DOE in the room per RN, keep an eye out for signs  of vol excess. CXR ordered.    Kelly Splinter MD [facility] pager [phone]    cell [phone] 08/14/2015, 12:33 PM    Recent Labs Lab 08/13/15 0131 08/13/15 1730  NA 135 137  K 4.5 4.2  CL 104 110  CO2 17* 17*  GLUCOSE 102* 94  BUN 27* 23*  CREATININE 4.13* 3.80*  CALCIUM 9.5 8.8*  PHOS  --  2.6    Recent Labs Lab 08/13/15 0131 08/13/15 1730  AST 44*  --   ALT 37  --   ALKPHOS 96  --   BILITOT 0.5  --   PROT 7.9  --   ALBUMIN 3.4* 2.8*    Recent Labs Lab 08/13/15 0131  WBC 9.2  NEUTROABS 6.4  HGB 11.3*  HCT 33.7*  MCV 94.1  PLT 182

## 2015-08-14 NOTE — Progress Notes (Addendum)
Subjective:  James [name] says he feels "much better" this morning with the nausea on diarrhea improved. He says he is still making urine. He has no other complaints, and he indicated that he wanted to stay at [facility] rather than transfer to [facility] to receive his care.  Objective: Vital signs in last 24 hours: Filed Vitals:   08/13/15 1656 08/13/15 2011 08/14/15 0423 08/14/15 0933  BP:  143/78 127/84 134/80  Pulse:  105 87 85  Temp: 100.4 F (38 C) 99.2 F (37.3 C) 98 F (36.7 C) 97.9 F (36.6 C)  TempSrc: Oral  Oral Oral  Resp:  21 23 22   Height:      Weight:  145 lb 15.1 oz (66.2 kg)    SpO2:  98% 99% 99%   Weight change: -46 lb 0.9 oz (-20.891 kg)  Intake/Output Summary (Last 24 hours) at 08/14/15 1043 Last data filed at 08/14/15 0934  Gross per 24 hour  Intake  592.5 ml  Output      0 ml  Net  592.5 ml   Physical Exam:  HEENT: No scleral icterus, moist mucous membranes, no tonsillar exudate or erythema, neck supple, Nose crusted at opening. Cardiovascular: RRR. No murmurs, rubs, or gallops Pulmonary: Clear to auscultation bilaterally. Unlabored breathing Abdominal: Obese, soft, NT/ND.Normal bowel sounds. No tenderness at transplant site. Extremities: No clubbing, cyanosis, or edema Skin: Warm and dry. No rashes Neurological: Tongue midline, face symmetric. Psychiatric: Normal behavior and affect    Lab Results: Basic Metabolic Panel:  Recent Labs Lab 08/13/15 0131 08/13/15 1730  NA 135 137  K 4.5 4.2  CL 104 110  CO2 17* 17*  GLUCOSE 102* 94  BUN 27* 23*  CREATININE 4.13* 3.80*  CALCIUM 9.5 8.8*  PHOS  --  2.6   Liver Function Tests:  Recent Labs Lab 08/13/15 0131 08/13/15 1730  AST 44*  --   ALT 37  --   ALKPHOS 96  --   BILITOT 0.5  --   PROT 7.9  --   ALBUMIN 3.4* 2.8*   CBC:  Recent Labs Lab 08/13/15 0131  WBC 9.2  NEUTROABS 6.4  HGB 11.3*  HCT 33.7*  MCV 94.1  PLT 182  CBG:  Recent Labs Lab  08/13/15 1639 08/13/15 2101 08/14/15 0825  GLUCAP 91 164* 129*   Urinalysis:  Recent Labs Lab 08/13/15 0129  COLORURINE YELLOW  LABSPEC 1.013  PHURINE 5.5  GLUCOSEU NEGATIVE  HGBUR MODERATE*  BILIRUBINUR NEGATIVE  KETONESUR 15*  PROTEINUR >300*  NITRITE NEGATIVE  LEUKOCYTESUR NEGATIVE   Micro Results: Recent Results (from the past 240 hour(s))  Culture, blood (routine x 2)     Status: None (Preliminary result)   Collection Time: 08/13/15  1:15 AM  Result Value Ref Range Status   Specimen Description BLOOD RIGHT ARM  Final   Special Requests   Final    BOTTLES DRAWN AEROBIC AND ANAEROBIC 5ML ANA 10ML AER   Culture  Setup Time   Final    GRAM NEGATIVE RODS IN BOTH AEROBIC AND ANAEROBIC BOTTLES CRITICAL RESULT CALLED TO, READ BACK BY AND VERIFIED WITH: RN MURPHY,V CONFIRMED BY V WILLIAMS    Culture GRAM NEGATIVE RODS  Final   Report Status PENDING  Incomplete  Culture, blood (routine x 2)     Status: None (Preliminary result)   Collection Time: 08/13/15  1:21 AM  Result Value Ref Range Status   Specimen Description BLOOD RIGHT HAND  Final  Special Requests IN PEDIATRIC BOTTLE 5ML  Final   Culture  Setup Time   Final    GRAM NEGATIVE RODS IN PEDIATRIC BOTTLE CRITICAL RESULT CALLED TO, READ BACK BY AND VERIFIED WITH: RN SHARPE,H    Culture GRAM NEGATIVE RODS  Final   Report Status PENDING  Incomplete  C difficile quick scan w PCR reflex     Status: None   Collection Time: 08/13/15  2:27 PM  Result Value Ref Range Status   C Diff antigen NEGATIVE NEGATIVE Final   C Diff toxin NEGATIVE NEGATIVE Final   C Diff interpretation Negative for toxigenic C. difficile  Final   Studies/Results: Dg Chest 2 View  08/13/2015  CLINICAL DATA:  Subacute onset of fever, nausea, vomiting, diarrhea and chills. Initial encounter. EXAM: CHEST  2 VIEW COMPARISON:  Chest radiograph performed 02/22/2004 FINDINGS: The lungs are well-aerated. Mild peribronchial thickening is noted. There  is no evidence of focal opacification, pleural effusion or pneumothorax. The heart is normal in size; the mediastinal contour is within normal limits. No acute osseous abnormalities are seen. Clips are noted within the right upper quadrant, reflecting prior cholecystectomy. IMPRESSION: Mild peribronchial thickening noted.  Lungs otherwise clear. Electronically Signed   By: Garald Balding M.D.   On: 08/13/2015 01:29   Medications: I have reviewed the patient's current medications. Scheduled Meds: . heparin  5,000 Units Subcutaneous 3 times per day  . hydrocortisone sod succinate (SOLU-CORTEF) inj  25 mg Intravenous Q8H  . insulin aspart  0-20 Units Subcutaneous TID WC  . insulin aspart  0-5 Units Subcutaneous QHS  . multivitamin with minerals  1 tablet Oral Daily  . piperacillin-tazobactam (ZOSYN)  IV  3.375 g Intravenous Q8H  . sodium bicarbonate  1,300 mg Oral TID  . tacrolimus  0.5 mg Sublingual QHS  . tacrolimus  1 mg Sublingual Daily  . tamsulosin  0.4 mg Oral QPM  . valACYclovir  500 mg Oral BH-q7a   Continuous Infusions: . sodium chloride 150 mL/hr at 08/13/15 2132   PRN Meds:.acetaminophen, ondansetron (ZOFRAN) IV **OR** ondansetron (ZOFRAN) IV, zolpidem Assessment/Plan:  Gram Negative Rod Sepsis Secondary to Infectious Colitis: Likely source is an infectious bacterial colitis in the setting of immunosuppression. He is responding well to IV antibiotics with a down-trending fever curve. No leukocytosis likely given immunosuppression. C diff negative. 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 His creatinine is down-trending from 4.13 to 3.8, but not near  baseline of 2.5. History is more consistent with a pre-renal etiology given poor oral intake, but calculated FENa is 1.6%, which is indeterminate. We had a discussion with James [name] today about the possibility of transferring to [facility], where he would likely go if he needed a renal biopsy to query rejection, but he is comfortable staying at [facility] at this time and being treated by our Nephrology and Internal Medicine Teaching Service. His immunosuppression therapy has been converted to sublingual and IV dosing. - NS 150 cc/hr - Tacrolimus level pending - Tacrolimus 1 mg po SL AM and 0.5 mg po SL in evening - Valacyclovir 500 mg daily for CMV prophylaxis - Hydrocortisone 25 mg q8h IV - Sodium bicarbonate 1.3 g TID - RFP in AM  T2DM: Glucose 129 while eating 40-50% of meals. On 40U Lantus QHS with Humalog 15U TID before meals at home. - Resistant SSI with HS correction only, may add back long-acting as appetite improves  HTN: 134/80. On Metoprolol 75 mg BID and lisinopril 5 mg BID at home. - Holding antihypertensives for now  BPH: Continue home tamsulosin 0.4 mg qhs  Insomnia: Continue home zolpidem 5 mg nightly prn.  DVT Prophylaxis: Heparin [place]  Dispo: Disposition is deferred at this time, awaiting improvement of current medical problems.  Anticipated discharge in approximately 1-2 day(s).   The patient does not have a current PCP (No primary care provider on file.) and does not need an [facility] hospital follow-up appointment after discharge.  The patient does have transportation limitations that hinder transportation to clinic appointments.  .Services Needed at time of discharge: Y = Yes, Blank = No PT:   OT:   RN:   Equipment:   Other:       Liberty Handy, MD 08/14/2015, 10:43 AM

## 2015-08-14 NOTE — Progress Notes (Signed)
  Date: 08/14/2015  Patient name: Jordani Salm [facility]  Medical record number: [id number]  Date of birth: 02-09-1971   I have seen and evaluated Daphene Jaeger and discussed their care with the Residency Team. Briefly, Mr. Breese is a 45yo man with PMH of kidney transplant in 2008 on tacrolimus and prednisone, tobacco use who presents with an acute diarrheal illness.  He further had nausea, vomiting and decreased ability to keep PO pills down.  Further developed generalized weakness and dizziness.  He was found to have GNR in the blood and AKI on CKD so he was admitted for fluids and antibiotics.    PMHx, Fam Hx, and/or Soc Hx : He is adopted.  He is a current smoker.   Filed Vitals:   08/14/15 0423 08/14/15 0933  BP: 127/84 134/80  Pulse: 87 85  Temp: 98 F (36.7 C) 97.9 F (36.6 C)  Resp: 23 22   Exam:  Gen: Alert, oriented, NAD HENT: NCAT, MMM Eyes: Anicteric sclerae, EOMI CV: RR, NR, no murmur Pulm: Clear, breathing comfortably Abd: Somewhat distended, but soft, NT, decreased bowel sounds, no tenderness over site of transplant Ext: Thin, no edema, no rash.   Pertinent data Cr  4.13 --> 3.8 (baseline 2.5) HCO3 - 17 WBC 9.2 LA 1.14  UA neg nitrite, protein > 300  Micro BC X 2 - 2/2 + for GNR Cdiff negative  CXR IMPRESSION: Mild peribronchial thickening noted. Lungs otherwise clear.   Assessment and Plan: I have seen and evaluated the patient as outlined above. I agree with the formulated Assessment and Plan as detailed in the residents' note, with the following changes:   1. GNR sepsis, diarrhea - Likely infectious diarrhea with GNR sepsis - Zosyn started - BC pending speciation - Repeat cultures for fever - Cdiff negative, likely bacterial so GI pathogen panel cancelled  - IVF with NS at 150cc/hr today - Consider clear diet today if tolerated  2. AKI in the setting of renal transplant - Improving with fluids - Nephrology consult - Tacrolimus level - Tacrolimus  2mg  in the AM, 1mg  in the PM - solu-cortef given chronic steroid use - Sodium bicarb  3. DM2 - SSI  4. HTN - BP was low, holding renally dosed medications and all others until BP improves   Sid Falcon, MD 1/22/20179:41 AM

## 2015-08-15 ENCOUNTER — Inpatient Hospital Stay (HOSPITAL_COMMUNITY): Payer: Medicare Other

## 2015-08-15 DIAGNOSIS — A09 Infectious gastroenteritis and colitis, unspecified: Secondary | ICD-10-CM

## 2015-08-15 DIAGNOSIS — Z794 Long term (current) use of insulin: Secondary | ICD-10-CM

## 2015-08-15 DIAGNOSIS — R7881 Bacteremia: Secondary | ICD-10-CM

## 2015-08-15 DIAGNOSIS — A021 Salmonella sepsis: Principal | ICD-10-CM

## 2015-08-15 DIAGNOSIS — D638 Anemia in other chronic diseases classified elsewhere: Secondary | ICD-10-CM

## 2015-08-15 DIAGNOSIS — N400 Enlarged prostate without lower urinary tract symptoms: Secondary | ICD-10-CM

## 2015-08-15 DIAGNOSIS — G4700 Insomnia, unspecified: Secondary | ICD-10-CM

## 2015-08-15 DIAGNOSIS — R6520 Severe sepsis without septic shock: Secondary | ICD-10-CM

## 2015-08-15 LAB — URINE CULTURE

## 2015-08-15 LAB — CBC
HCT: 22.8 % — ABNORMAL LOW (ref 39.0–52.0)
HCT: 25.4 % — ABNORMAL LOW (ref 39.0–52.0)
HEMOGLOBIN: 7.9 g/dL — AB (ref 13.0–17.0)
Hemoglobin: 8.7 g/dL — ABNORMAL LOW (ref 13.0–17.0)
MCH: 31.2 pg (ref 26.0–34.0)
MCH: 31.3 pg (ref 26.0–34.0)
MCHC: 34.6 g/dL (ref 30.0–36.0)
MCHC: 34.3 g/dL (ref 30.0–36.0)
MCV: 90.1 fL (ref 78.0–100.0)
MCV: 91.4 fL (ref 78.0–100.0)
Platelets: 166 10*3/uL (ref 150–400)
Platelets: 159 10*3/uL (ref 150–400)
RBC: 2.53 MIL/uL — AB (ref 4.22–5.81)
RBC: 2.78 MIL/uL — ABNORMAL LOW (ref 4.22–5.81)
RDW: 13 % (ref 11.5–15.5)
RDW: 13 % (ref 11.5–15.5)
WBC: 5.2 10*3/uL (ref 4.0–10.5)
WBC: 6.6 10*3/uL (ref 4.0–10.5)

## 2015-08-15 LAB — GLUCOSE, CAPILLARY
GLUCOSE-CAPILLARY: 109 mg/dL — AB (ref 65–99)
GLUCOSE-CAPILLARY: 103 mg/dL — AB (ref 65–99)
GLUCOSE-CAPILLARY: 161 mg/dL — AB (ref 65–99)

## 2015-08-15 LAB — RENAL FUNCTION PANEL
ANION GAP: 9 (ref 5–15)
Albumin: 2.6 g/dL — ABNORMAL LOW (ref 3.5–5.0)
BUN: 13 mg/dL (ref 6–20)
CALCIUM: 8.9 mg/dL (ref 8.9–10.3)
CO2: 18 mmol/L — AB (ref 22–32)
Chloride: 114 mmol/L — ABNORMAL HIGH (ref 101–111)
Creatinine, Ser: 3.2 mg/dL — ABNORMAL HIGH (ref 0.61–1.24)
GFR calc non Af Amer: 22 mL/min — ABNORMAL LOW (ref >60)
GFR, EST AFRICAN AMERICAN: 26 mL/min — AB (ref >60)
Glucose, Bld: 100 mg/dL — ABNORMAL HIGH (ref 65–99)
Phosphorus: 1.8 mg/dL — ABNORMAL LOW (ref 2.5–4.6)
Potassium: 3.8 mmol/L (ref 3.5–5.1)
SODIUM: 141 mmol/L (ref 135–145)

## 2015-08-15 LAB — TACROLIMUS LEVEL: TACROLIMUS (FK506) - LABCORP: 11.6 ng/mL (ref 2.0–20.0)

## 2015-08-15 MED ORDER — AMLODIPINE BESYLATE 5 MG PO TABS
5.0000 mg | ORAL_TABLET | Freq: Every day | ORAL | Status: DC
  Administered 2015-08-15 – 2015-08-16 (×2): 5 mg via ORAL
  Filled 2015-08-15 (×2): qty 1

## 2015-08-15 MED ORDER — SODIUM CHLORIDE 0.9 % IV SOLN: INTRAVENOUS | Status: DC

## 2015-08-15 NOTE — Progress Notes (Signed)
Initial Nutrition Assessment  DOCUMENTATION CODES:   Obesity unspecified  INTERVENTION:  Provide nourishment snacks. (Ordered)  Encourage adequate PO intake.   NUTRITION DIAGNOSIS:   Inadequate oral intake related to poor appetite as evidenced by  (varied meal completion 40-100%).  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  Meal completion has been varied from 40-100%. Pt reports having a lack of appetite which has been ongoing since 08/06/2015. Pt reports he has been only able to consume 1 meal a day. Usual body weight reported to be ~194 lbs. Question current recorded weight accuracy. Pt was offered supplements, however refused. Pt reports he is a vegan and just recently stopped consuming milk products. Pt educated on food choices he may consume on the menu that are vegan. Pt is agreeable to nourishment snacks. RD to order. Pt was encouraged to eat his food at meals.   Pt with no observed significant fat or muscle mass loss.   Labs and medications reviewed.  Diet Order:  Diet renal/carb modified with fluid restriction Diet-HS Snack?: Nothing; Room service appropriate?: Yes; Fluid consistency:: Thin  Skin:  Reviewed, no issues  Last BM:  1/22  Height:   Ht Readings from Last 1 Encounters:  08/13/15 5\' 7"  (1.702 m)    Weight:   Wt Readings from Last 1 Encounters:  08/13/15 145 lb 15.1 oz (66.2 kg)    Ideal Body Weight:  67.27 kg  BMI:  Body mass index is 22.85 kg/(m^2).  Estimated Nutritional Needs:   Kcal:  1900-2100  Protein:  95-105 grams  Fluid:  1.9 - 2.1 L/day  EDUCATION NEEDS:   Education needs addressed  Corrin Parker, MS, RD, LDN Pager #  [phone] After hours/ weekend pager # [phone]

## 2015-08-15 NOTE — Progress Notes (Signed)
Patient seen and examined. Case d/w residents in detail. I agree with findings and plan as documented in Dr. Barbera Setters note.  Diarrhea appears to be resolving. Tolerating PO intake. Patient with history of ESRD s/p renal transplant with baseline Cr of 2.5 - 2.8. Cr now improving but still not at baseline. Nephro f/u appreciated. C/w NS @ 50 cc/hr for now. Will recheck BMP in AM. C/w tacrolimus.  Patient with salmonella bacteremia. Improving with pip/tazo. Will c/w current regimen for now. Repeat blood cx today. Will consider completing course of abx with PO quinolones once stable for discharge. May require prolonged course considering immunosuppression.   Would recheck Hg today given worsening anemia possibly dilutional or secondary to AKI on CKD. No evidence of bleed.

## 2015-08-15 NOTE — Progress Notes (Signed)
Note placed at pt request for MD, pt states he "would like to go home as soon as possible, as soon as medically ready". Pt did not want to discuss reasons for, "just would like to go home as soon as possible."

## 2015-08-15 NOTE — Progress Notes (Signed)
Pt. Says he needs his IV fluid turned off because he fears the fluid is backing into his lungs as he is not putting out a lot of fluids.  Pt. Says he needs to talk to MD about his output and IV fluids revision. MD notified.

## 2015-08-15 NOTE — Progress Notes (Addendum)
Subjective:  [name] says he feels "much better" this morning with the nausea on diarrhea improved. He endorses some intermittent chills. His last BM was yesterday afternoon and had already started to be more formed.  He says he is still making urine. He said he would not want continuous IVF at this time to have better control over his fluid intake. He said he is able in drink plenty of water now and plans to do so.    Objective: Vital signs in last 24 hours: Filed Vitals:   08/14/15 1859 08/14/15 1952 08/15/15 0521 08/15/15 0819  BP: 120/77 136/83 159/88 164/91  Pulse: 89 100 100 103  Temp: 98.3 F (36.8 C)  98.7 F (37.1 C) 99.3 F (37.4 C)  TempSrc: Oral  Oral Oral  Resp: 22 19 18 18   Height:      Weight:      SpO2: 98% 100% 100% 97%   Weight change:   Intake/Output Summary (Last 24 hours) at 08/15/15 1155 Last data filed at 08/15/15 0858  Gross per 24 hour  Intake   5680 ml  Output   1050 ml  Net   4630 ml   Physical Exam:  HEENT: No scleral icterus, moist mucous membranes, no tonsillar exudate or erythema, neck supple, Nose crusted at opening. Cardiovascular: Mildly tachycardic but regular rhythm. No murmurs, rubs, or gallops Pulmonary: Clear to auscultation bilaterally. Unlabored breathing Abdominal: Obese, soft, NT/ND.Normal bowel sounds. No tenderness at transplant site. Extremities: No clubbing, cyanosis, or edema Skin: Warm and dry. No rashes Neurological: Tongue midline, face symmetric. Psychiatric: Normal behavior and affect    Lab Results: Basic Metabolic Panel:  Recent Labs Lab 08/14/15 1223 08/15/15 0720  NA 140 141  K 4.1 3.8  CL 111 114*  CO2 17* 18*  GLUCOSE 130* 100*  BUN 17 13  CREATININE 3.54* 3.20*  CALCIUM 9.0 8.9  PHOS 2.7 1.8*   Liver Function Tests:  Recent Labs Lab 08/13/15 0131  08/14/15 1223 08/15/15 0720  AST 44*  --   --   --   ALT 37  --   --   --   ALKPHOS 96  --   --   --   BILITOT 0.5  --   --   --   PROT  7.9  --   --   --   ALBUMIN 3.4*  < > 2.8* 2.6*  < > = values in this interval not displayed. CBC:  Recent Labs Lab 08/13/15 0131 08/14/15 1223 08/15/15 0720  WBC 9.2 4.2 5.2  NEUTROABS 6.4  --   --   HGB 11.3* 9.3* 7.9*  HCT 33.7* 27.1* 22.8*  MCV 94.1 92.8 90.1  PLT 182 145* 166  CBG:  Recent Labs Lab 08/14/15 0825 08/14/15 1201 08/14/15 1716 08/14/15 2154 08/15/15 0739 08/15/15 1136  GLUCAP 129* 130* 157* 136* 109* 103*   Urinalysis:  Recent Labs Lab 08/13/15 0129  COLORURINE YELLOW  LABSPEC 1.013  PHURINE 5.5  GLUCOSEU NEGATIVE  HGBUR MODERATE*  BILIRUBINUR NEGATIVE  KETONESUR 15*  PROTEINUR >300*  NITRITE NEGATIVE  LEUKOCYTESUR NEGATIVE   Micro Results: Recent Results (from the past 240 hour(s))  Culture, blood (routine x 2)     Status: None (Preliminary result)   Collection Time: 08/13/15  1:15 AM  Result Value Ref Range Status   Specimen Description BLOOD RIGHT ARM  Final   Special Requests   Final    BOTTLES DRAWN AEROBIC AND ANAEROBIC 5ML ANA 10ML AER  Culture  Setup Time   Final    GRAM NEGATIVE RODS IN BOTH AEROBIC AND ANAEROBIC BOTTLES CRITICAL RESULT CALLED TO, READ BACK BY AND VERIFIED WITH: RN MURPHY,V CONFIRMED BY V WILLIAMS    Culture   Final    SALMONELLA SPECIES SUSCEPTIBILITIES PERFORMED ON PREVIOUS CULTURE WITHIN THE LAST 5 DAYS. RESULT CALLED TO, READ BACK BY AND VERIFIED WITH: T PENNINGTON 08/15/15 @ 82 M VESTAL    Report Status PENDING  Incomplete  Culture, blood (routine x 2)     Status: None (Preliminary result)   Collection Time: 08/13/15  1:21 AM  Result Value Ref Range Status   Specimen Description BLOOD RIGHT HAND  Final   Special Requests IN PEDIATRIC BOTTLE 5ML  Final   Culture  Setup Time   Final    GRAM NEGATIVE RODS IN PEDIATRIC BOTTLE CRITICAL RESULT CALLED TO, READ BACK BY AND VERIFIED WITH: RN SHARPE,H    Culture   Final    SALMONELLA SPECIES RESULT CALLED TO, READ BACK BY AND VERIFIED WITH: T  PENNINGTON 08/15/15 @ 71 M VESTAL    Report Status PENDING  Incomplete   Organism ID, Bacteria SALMONELLA SPECIES  Final      Susceptibility   Salmonella species - MIC*    AMPICILLIN <=2 SENSITIVE Sensitive     LEVOFLOXACIN <=0.12 SENSITIVE Sensitive     TRIMETH/SULFA <=20 SENSITIVE Sensitive     * SALMONELLA SPECIES  Urine culture     Status: None   Collection Time: 08/13/15  1:29 AM  Result Value Ref Range Status   Specimen Description URINE, CLEAN CATCH  Final   Special Requests NONE  Final   Culture MULTIPLE SPECIES PRESENT, SUGGEST RECOLLECTION  Final   Report Status 08/15/2015 FINAL  Final  C difficile quick scan w PCR reflex     Status: None   Collection Time: 08/13/15  2:27 PM  Result Value Ref Range Status   C Diff antigen NEGATIVE NEGATIVE Final   C Diff toxin NEGATIVE NEGATIVE Final   C Diff interpretation Negative for toxigenic C. difficile  Final   Studies/Results: US Renal Transplant W/doppler  08/14/2015  CLINICAL DATA:  Acute on chronic renal failure, in renal transplant EXAM: ULTRASOUND OF RENAL TRANSPLANT WITH DOPPLER TECHNIQUE: Ultrasound examination of the renal transplant was performed with gray-scale, color and duplex doppler evaluation. COMPARISON:  04/10/2014 FINDINGS: Transplant kidney location:  Right lower quadrant Transplant kidney: Length: 11.5 cm. Normal in size and parenchymal echogenicity. No evidence of mass or hydronephrosis. Color flow in the main renal artery:  Present Color flow in the main renal vein:  Present Duplex Doppler Evaluation Resistive index in main renal artery: 0.65 Venous waveform in main renal vein:  present Resistive index in upper pole intrarenal artery:  0.63 (normal 0.6-0.8; equivocal 0.8-0.9; abnormal > 0.9) Resistive index in lower pole intrarenal artery: 0.64 (normal 0.6-0.8; equivocal 0.8-0.9; abnormal > 0.9) Bladder: Normal for degree of bladder distention. Other findings:  No abnormalities in the transplant bed. IMPRESSION:  Normal study Electronically Signed   By: Skipper Cliche M.D.   On: 08/14/2015 15:21   Dg Chest Port 1 View  08/14/2015  CLINICAL DATA:  Shortness of breath and dyspnea on exertion today. Chronic renal failure. EXAM: PORTABLE CHEST 1 VIEW COMPARISON:  08/13/2015 FINDINGS: The heart is borderline enlarged but stable. There is mild tortuosity of the thoracic aorta. The lungs are clear. No pleural effusion. The bony thorax is intact. IMPRESSION: No acute cardiopulmonary findings. Electronically Signed  By: Marijo Sanes M.D.   On: 08/14/2015 13:36   Medications: I have reviewed the patient's current medications. Scheduled Meds: . heparin  5,000 Units Subcutaneous 3 times per day  . hydrocortisone sod succinate (SOLU-CORTEF) inj  25 mg Intravenous Q8H  . insulin aspart  0-20 Units Subcutaneous TID WC  . insulin aspart  0-5 Units Subcutaneous QHS  . multivitamin with minerals  1 tablet Oral Daily  . pantoprazole  80 mg Oral Daily  . piperacillin-tazobactam (ZOSYN)  IV  3.375 g Intravenous Q8H  . sodium bicarbonate  1,300 mg Oral TID  . tacrolimus  0.5 mg Sublingual QHS  . tacrolimus  1 mg Sublingual Daily  . tamsulosin  0.4 mg Oral QPM  . valACYclovir  500 mg Oral BH-q7a   Continuous Infusions:   PRN Meds:.acetaminophen, ondansetron (ZOFRAN) IV **OR** ondansetron (ZOFRAN) IV, zolpidem Assessment/Plan:  Salmonella Sepsis Secondary to Infectious Colitis: Sensitive to ampicillin, levofloxacin, bactrim (would not use in this patient). Likely source is an infectious bacterial colitis in the setting of immunosuppression. He is responding well to IV antibiotics and has been afebrile. Will continue IV antibiotics today and transition to oral antibiotics tomorrow. Spoke with Dr. Linus Salmons today and recommends ciprofloxacin two weeks from last negative blood culture.  - Piperacillin-Tazobactam per pharm - Monitor fever curve and symptom improvement - Repeat Blood cultures negative - CBC in AM  AKI in  setting of Renal Transplant on Immunosuppression: Likely pre-renal in setting of diarrhea and poor oral intake, low suspicion for rejection at this time given normal renal ultrasound and improvement on IVF. Creatinine improvement 4.13>3.8 >3.54 >3.2. Will reduce fluid rate today. Nephrology following, and we appreciate recommendations.  - NS 50 cc/hr - Tacrolimus level pending - Tacrolimus 1 mg po SL AM and 0.5 mg po SL in evening - Valacyclovir 500 mg daily for CMV prophylaxis - Hydrocortisone 25 mg q8h IV - Sodium bicarbonate 1.3 g TID - RFP in AM  Anemia of Chronic Disease: Hgb 11.3 on admission to 7.9 today. No bleeding episodes and likely a component of hemodilution given that other cell lines are down. Baseline appears to be 11-12 based on Care Everywhere. - May need ESA - CBC in this afternoon  T2DM: Glucose 100s while eating 100% of meals. On 40U Lantus QHS with Humalog 15U TID before meals at home. - Resistant SSI with HS correction only, may add back long-acting as appetite improves  HTN: 164/91. On Metoprolol 75 mg BID and lisinopril 5 mg BID at home. - Start amlodipine 5 mg - Holding other antihypertensives for now  BPH: Continue home tamsulosin 0.4 mg qhs  Insomnia: Continue home zolpidem 5 mg nightly prn.  DVT Prophylaxis: Heparin [place]  Dispo: Disposition is deferred at this time, awaiting improvement of current medical problems.  Anticipated discharge in approximately 1-2 day(s).   The patient does not have a current PCP (No primary care provider on file.) and does not need an [facility] hospital follow-up appointment after discharge.  The patient does have transportation limitations that hinder transportation to clinic appointments.  .Services Needed at time of discharge: Y = Yes, Blank = No PT:   OT:   RN:   Equipment:   Other:     LOS: 1 day   Liberty Handy, MD 08/15/2015, 11:55 AM

## 2015-08-15 NOTE — Progress Notes (Signed)
  Echocardiogram 2D Echocardiogram has been performed.  Meredith Mells 08/15/2015, 5:00 PM

## 2015-08-15 NOTE — Progress Notes (Signed)
Patient ID: James [name], male   DOB: 1970-09-06, 45 y.o.   MRN: [id number] S:Had some SOB this morning and IVF's held but feeling better now after he urinated O:BP 164/91 mmHg  Pulse 103  Temp(Src) 99.3 F (37.4 C) (Oral)  Resp 18  Ht 5\' 7"  (1.702 m)  Wt 66.2 kg (145 lb 15.1 oz)  BMI 22.85 kg/m2  SpO2 97%  Intake/Output Summary (Last 24 hours) at 08/15/15 1236 Last data filed at 08/15/15 1201  Gross per 24 hour  Intake   5800 ml  Output   1350 ml  Net   4450 ml   Intake/Output: I/O last 3 completed shifts: In: 59 [P.O.:780; I.V.:4950; IV Piggyback:250] Out: 1050 [Urine:1050]  Intake/Output this shift:  Total I/O In: 120 [P.O.:120] Out: 500 [Urine:500] Weight change:  Gen:WD WN AAM in NAD CVS:no rub, tachy Resp:cta Abd:+BS, soft Ext:no edema, clotted AVF in LUE ext.    Recent Labs Lab 08/13/15 0131 08/13/15 1730 08/14/15 1223 08/15/15 0720  NA 135 137 140 141  K 4.5 4.2 4.1 3.8  CL 104 110 111 114*  CO2 17* 17* 17* 18*  GLUCOSE 102* 94 130* 100*  BUN 27* 23* 17 13  CREATININE 4.13* 3.80* 3.54* 3.20*  ALBUMIN 3.4* 2.8* 2.8* 2.6*  CALCIUM 9.5 8.8* 9.0 8.9  PHOS  --  2.6 2.7 1.8*  AST 44*  --   --   --   ALT 37  --   --   --    Liver Function Tests:  Recent Labs Lab 08/13/15 0131 08/13/15 1730 08/14/15 1223 08/15/15 0720  AST 44*  --   --   --   ALT 37  --   --   --   ALKPHOS 96  --   --   --   BILITOT 0.5  --   --   --   PROT 7.9  --   --   --   ALBUMIN 3.4* 2.8* 2.8* 2.6*   No results for input(s): LIPASE, AMYLASE in the last 168 hours. No results for input(s): AMMONIA in the last 168 hours. CBC:  Recent Labs Lab 08/13/15 0131 08/14/15 1223 08/15/15 0720  WBC 9.2 4.2 5.2  NEUTROABS 6.4  --   --   HGB 11.3* 9.3* 7.9*  HCT 33.7* 27.1* 22.8*  MCV 94.1 92.8 90.1  PLT 182 145* 166   Cardiac Enzymes: No results for input(s): CKTOTAL, CKMB, CKMBINDEX, TROPONINI in the last 168 hours. CBG:  Recent Labs Lab 08/14/15 1201  08/14/15 1716 08/14/15 2154 08/15/15 0739 08/15/15 1136  GLUCAP 130* 157* 136* 109* 103*    Iron Studies: No results for input(s): IRON, TIBC, TRANSFERRIN, FERRITIN in the last 72 hours. Studies/Results: US Renal Transplant W/doppler  08/14/2015  CLINICAL DATA:  Acute on chronic renal failure, in renal transplant EXAM: ULTRASOUND OF RENAL TRANSPLANT WITH DOPPLER TECHNIQUE: Ultrasound examination of the renal transplant was performed with gray-scale, color and duplex doppler evaluation. COMPARISON:  04/10/2014 FINDINGS: Transplant kidney location:  Right lower quadrant Transplant kidney: Length: 11.5 cm. Normal in size and parenchymal echogenicity. No evidence of mass or hydronephrosis. Color flow in the main renal artery:  Present Color flow in the main renal vein:  Present Duplex Doppler Evaluation Resistive index in main renal artery: 0.65 Venous waveform in main renal vein:  present Resistive index in upper pole intrarenal artery:  0.63 (normal 0.6-0.8; equivocal 0.8-0.9; abnormal > 0.9) Resistive index in lower pole intrarenal artery: 0.64 (normal 0.6-0.8; equivocal 0.8-0.9;  abnormal > 0.9) Bladder: Normal for degree of bladder distention. Other findings:  No abnormalities in the transplant bed. IMPRESSION: Normal study Electronically Signed   By: Skipper Cliche M.D.   On: 08/14/2015 15:21   Dg Chest Port 1 View  08/14/2015  CLINICAL DATA:  Shortness of breath and dyspnea on exertion today. Chronic renal failure. EXAM: PORTABLE CHEST 1 VIEW COMPARISON:  08/13/2015 FINDINGS: The heart is borderline enlarged but stable. There is mild tortuosity of the thoracic aorta. The lungs are clear. No pleural effusion. The bony thorax is intact. IMPRESSION: No acute cardiopulmonary findings. Electronically Signed   By: Marijo Sanes M.D.   On: 08/14/2015 13:36   . heparin  5,000 Units Subcutaneous 3 times per day  . hydrocortisone sod succinate (SOLU-CORTEF) inj  25 mg Intravenous Q8H  . insulin aspart   0-20 Units Subcutaneous TID WC  . insulin aspart  0-5 Units Subcutaneous QHS  . multivitamin with minerals  1 tablet Oral Daily  . pantoprazole  80 mg Oral Daily  . piperacillin-tazobactam (ZOSYN)  IV  3.375 g Intravenous Q8H  . sodium bicarbonate  1,300 mg Oral TID  . tacrolimus  0.5 mg Sublingual QHS  . tacrolimus  1 mg Sublingual Daily  . tamsulosin  0.4 mg Oral QPM  . valACYclovir  500 mg Oral BH-q7a    BMET    Component Value Date/Time   NA 141 08/15/2015 0720   K 3.8 08/15/2015 0720   CL 114* 08/15/2015 0720   CO2 18* 08/15/2015 0720   GLUCOSE 100* 08/15/2015 0720   BUN 13 08/15/2015 0720   CREATININE 3.20* 08/15/2015 0720   CALCIUM 8.9 08/15/2015 0720   GFRNONAA 22* 08/15/2015 0720   GFRAA 26* 08/15/2015 0720   CBC    Component Value Date/Time   WBC 5.2 08/15/2015 0720   RBC 2.53* 08/15/2015 0720   HGB 7.9* 08/15/2015 0720   HCT 22.8* 08/15/2015 0720   PLT 166 08/15/2015 0720   MCV 90.1 08/15/2015 0720   MCH 31.2 08/15/2015 0720   MCHC 34.6 08/15/2015 0720   RDW 13.0 08/15/2015 0720   LYMPHSABS 1.8 08/13/2015 0131   MONOABS 1.0 08/13/2015 0131   EOSABS 0.0 08/13/2015 0131   BASOSABS 0.0 08/13/2015 0131    Assessment/Plan:  1. AKI/CKD s/p renal transplant in setting of volume depletion due to N/V/D in setting of ACE inhibitor.  Improving with IVF's.  Baseline Scr ~2.5-2.8.  IVF's stopped this morning due to SOB but feeling better.  Will restart IVF's but at lower rate and follow.  2. Urinary retention- will check bladder scan and I&O cath if >340ml. 3. Diarrhea/vomiting/fever- on empiric abx per primary.  Cultures pending 4. HTN- elevated off of ACE.  Could start coreg or amlodipine and follow but would continue to hold ACE for now. 5. Anemia of chronic disease- may require ESA 6. DM- per primary 7. 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  On prograf.  Advance to po meds once able to tolerate po.   [place]

## 2015-08-16 LAB — GLUCOSE, CAPILLARY
GLUCOSE-CAPILLARY: 123 mg/dL — AB (ref 65–99)
Glucose-Capillary: 105 mg/dL — ABNORMAL HIGH (ref 65–99)
Glucose-Capillary: 114 mg/dL — ABNORMAL HIGH (ref 65–99)

## 2015-08-16 LAB — CBC
HEMATOCRIT: 25 % — AB (ref 39.0–52.0)
Hemoglobin: 8.6 g/dL — ABNORMAL LOW (ref 13.0–17.0)
MCH: 31.9 pg (ref 26.0–34.0)
MCHC: 34.4 g/dL (ref 30.0–36.0)
MCV: 92.6 fL (ref 78.0–100.0)
PLATELETS: 144 10*3/uL — AB (ref 150–400)
RBC: 2.7 MIL/uL — ABNORMAL LOW (ref 4.22–5.81)
RDW: 13.2 % (ref 11.5–15.5)
WBC: 6.2 10*3/uL (ref 4.0–10.5)

## 2015-08-16 LAB — RENAL FUNCTION PANEL
Albumin: 2.6 g/dL — ABNORMAL LOW (ref 3.5–5.0)
Anion gap: 9 (ref 5–15)
BUN: 10 mg/dL (ref 6–20)
CHLORIDE: 112 mmol/L — AB (ref 101–111)
CO2: 18 mmol/L — AB (ref 22–32)
CREATININE: 3.13 mg/dL — AB (ref 0.61–1.24)
Calcium: 8.5 mg/dL — ABNORMAL LOW (ref 8.9–10.3)
GFR calc Af Amer: 27 mL/min — ABNORMAL LOW (ref >60)
GFR calc non Af Amer: 23 mL/min — ABNORMAL LOW (ref >60)
GLUCOSE: 150 mg/dL — AB (ref 65–99)
POTASSIUM: 3.6 mmol/L (ref 3.5–5.1)
Phosphorus: 2.3 mg/dL — ABNORMAL LOW (ref 2.5–4.6)
Sodium: 139 mmol/L (ref 135–145)

## 2015-08-16 MED ORDER — CIPROFLOXACIN HCL 500 MG PO TABS: 500.0000 mg | ORAL_TABLET | Freq: Once | ORAL | Status: DC

## 2015-08-16 MED ORDER — PREDNISONE 5 MG PO TABS: 5.0000 mg | ORAL_TABLET | Freq: Every day | ORAL | Status: DC

## 2015-08-16 MED ORDER — TACROLIMUS 1 MG PO CAPS
2.0000 mg | ORAL_CAPSULE | Freq: Every day | ORAL | Status: DC
  Administered 2015-08-16: 2 mg via ORAL
  Filled 2015-08-16: qty 2

## 2015-08-16 MED ORDER — CIPROFLOXACIN HCL 500 MG PO TABS: 500.0000 mg | ORAL_TABLET | Freq: Two times a day (BID) | ORAL | Status: AC

## 2015-08-16 MED ORDER — TACROLIMUS 1 MG PO CAPS: 1.0000 mg | ORAL_CAPSULE | Freq: Every day | ORAL | Status: DC

## 2015-08-16 MED ORDER — TACROLIMUS 1 MG PO CAPS: 1.0000 mg | ORAL_CAPSULE | Freq: Two times a day (BID) | ORAL | Status: DC

## 2015-08-16 MED ORDER — AMLODIPINE BESYLATE 5 MG PO TABS: 5.0000 mg | ORAL_TABLET | Freq: Every day | ORAL | Status: DC

## 2015-08-16 NOTE — Progress Notes (Signed)
Patient seen and examined. Case d/w residents in detail. I agree with findings and plan as documented in Dr. Barbera Setters note.  Patient continues to improve. No n.v. Still with loose BM but more formed. TTE with no evidence of endocarditis and repeat blood cx with NGTD. Will c/w IV pip/tazo and transition to PO Cipro to complete 2 additional weeks of abx.   Will f/u BMP from today. If creatinine continues to improve patient will likely be dc'd home today.  C/w prednisone and tacrolimus for rena transplant. Will need outpatient f/u at [facility].

## 2015-08-16 NOTE — Evaluation (Addendum)
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 All education has been completed and the patient has no further questions. Noting mild dyspnea on exertion; James [name] is able to appropriately self-monitor for activity tolerance;  See below for any follow-up Physical Therapy or equipment needs. PT is signing off. Thank you for this referral.     Follow Up Recommendations No PT follow up    Equipment Recommendations  None recommended by PT    Recommendations for Other Services       Precautions / Restrictions Precautions Precaution Comments: Educated pt to self-monitor for activity tolerance Restrictions Weight Bearing Restrictions: No      Mobility  Bed Mobility Overal bed mobility: Independent                Transfers Overall transfer level: Independent                  Ambulation/Gait Ambulation/Gait assistance: Supervision;Modified independent (Device/Increase time) Ambulation Distance (Feet): 200 Feet Assistive device: None Gait Pattern/deviations: WFL(Within Functional Limits)     General Gait Details: Noting some DOE, 2/4; cues to self-monitor for activity tolerance, but overall managing well  Stairs Stairs: Yes Stairs assistance: Modified independent (Device/Increase time) Stair Management: One rail Right;Alternating pattern;Forwards Number of Stairs: 6 General stair comments: no difficulty noted; number of steps limited by IV  line  Wheelchair Mobility    Modified Rankin (Stroke Patients Only)       Balance                                             Pertinent Vitals/Pain Pain Assessment: No/denies pain    Home Living Family/patient expects to be discharged to:: Private residence Living Arrangements: Alone Available Help at Discharge: Family;Friend(s);Available PRN/intermittently Type of Home: House Home Access: Level entry     Home Layout: Two level;Bed/bath upstairs Home Equipment: None      Prior Function Level of Independence: Independent               Hand Dominance        Extremity/Trunk Assessment   Upper Extremity Assessment: Overall WFL for tasks assessed           Lower Extremity Assessment: Overall WFL for tasks assessed      Cervical / Trunk Assessment: Normal  Communication   Communication: No difficulties  Cognition Arousal/Alertness: Awake/alert Behavior During Therapy: WFL for tasks assessed/performed Overall Cognitive Status: Within Functional Limits for tasks assessed                      General Comments General comments (skin integrity, edema, etc.):    08/16/15 0903  Vital Signs  Patient Position (if appropriate) Orthostatic Vitals  Orthostatic Lying   BP- Lying (!) 165/94 mmHg  Pulse- Lying 119 (after walking back from bathroom)  Orthostatic Sitting  BP- Sitting (!) 147/93 mmHg  Pulse- Sitting 112  Orthostatic Standing at 0 minutes  BP- Standing at 0 minutes 123/81 mmHg  Pulse- Standing at 0 minutes 128  Orthostatic Standing at 3 minutes  BP- Standing at 3 minutes (!) 162/96 mmHg  Pulse- Standing at 3 minutes (after amb and stairs) 112   On this exam of ORthostatic Vitals, noted James [name]' BP is overall high -- this is likely related to the fact that he was up and around in room/bathroom before PT eval; There is a drop in SBP between lying down and standing up, he was asymptomatic for dizziness, and then  ultimately his BP was up after the walk and stairs -- an appropriate response to activity;   RN aware of BPS -- she may want to redo Orthostatics later.    Exercises        Assessment/Plan    PT Assessment Patent does not need any further PT services  PT Diagnosis Generalized weakness   PT Problem List    PT Treatment Interventions     PT Goals (Current goals can be found in the Care Plan section) Acute Rehab PT Goals Patient Stated Goal: hopes to get home today PT Goal Formulation: All assessment and education complete, DC therapy    Frequency     Barriers to discharge        Co-evaluation               End of Session   Activity Tolerance: Patient tolerated treatment well Patient left: in bed;with call bell/phone within reach Nurse Communication: Mobility status         Time: [id number] PT Time Calculation (min) (ACUTE ONLY): 23 min   Charges:   PT Evaluation $PT Eval Low Complexity: 1 Procedure PT Treatments $Gait Training: 8-22 mins   PT G Codes:        James [name] 08/16/2015, 9:27 AM  James [name], [place] Pager [phone] Office [phone]

## 2015-08-16 NOTE — Discharge Summary (Signed)
Name: James [name] MRN: [id number] DOB: 14-May-1971 45 y.o. PCP: No primary care provider on file.  Date of Admission: 08/13/2015  1:45 AM Date of Discharge: 08/16/2015 Attending Physician: Aldine Contes, MD Discharge Diagnosis: 1. Sepsis 2. Salmonella Bacteremia 3. Salmonella Gastroenteritis 4. Rental Transplant Recipient/ESRD 5. Hypertension 6. Type 2 Diabetes without complication 7. Anemia of Chronic Disease 8. Acute on Chronic Kidney Disease   Discharge Medications:   Medication List    STOP taking these medications        lisinopril 5 MG tablet  Commonly known as:  PRINIVIL,ZESTRIL      TAKE these medications        amLODipine 5 MG tablet  Commonly known as:  NORVASC  Take 1 tablet (5 mg total) by mouth daily.     ciprofloxacin 500 MG tablet  Commonly known as:  CIPRO  Take 1 tablet (500 mg total) by mouth 2 (two) times daily.     insulin lispro 100 UNIT/ML injection  Commonly known as:  HUMALOG  Inject 15 Units into the skin 3 (three) times daily before meals.     LANTUS SOLOSTAR 100 UNIT/ML Solostar Pen  Generic drug:  Insulin Glargine  Inject 40 Units into the skin daily at 10 pm.     metoprolol tartrate 25 MG tablet  Commonly known as:  LOPRESSOR  Take 75 mg by mouth 2 (two) times daily.     multivitamin with minerals tablet  Take 1 tablet by mouth every morning.     predniSONE 5 MG tablet  Commonly known as:  DELTASONE  Take 5 mg by mouth daily with breakfast.     sodium bicarbonate 650 MG tablet  Take 1,300 mg by mouth 3 (three) times daily.     tacrolimus 1 MG capsule  Commonly known as:  PROGRAF  Take 1-2 mg by mouth 2 (two) times daily. Take 2mg  in the morning and 1mg  in the evening.     tamsulosin 0.4 MG Caps capsule  Commonly known as:  FLOMAX  Take 0.4 mg by mouth every evening.     valACYclovir 500 MG tablet  Commonly known as:  VALTREX  Take 500 mg by mouth every morning.     zolpidem 5 MG tablet  Commonly known as:   AMBIEN  Take 5 mg by mouth at bedtime as needed for sleep.        Disposition and follow-up:   Mr.James [name] was discharged from [facility] in Good condition.  At the hospital follow up visit please address:  1.  Due to hypovolemia the the setting of GI losses, the patient had an AKI with a creatinine to 4 that down-trended toward his baseline of 2.5-2.8. At discharge his creatinine was 3.13. He will need a follow-up creatinine to assess for ongoing improvement.  2. Mr. Prophete was found to have a Salmonella bacteremia and discharged on a 2 week course of ciprofloxacin. Please assess for resolution of colitis symptoms as well as medication adherence.  3. On a sliding scale resistant regimen only, CBGs remained 103-157 even after resuming a carb-modified/renal diet. He required no more than 4U per meal. Nonetheless, I resumed his home insulin regimen with the understanding he would be eating more carb-rich foods at home. Please assess insulin needs at follow-up endocrinology visit. The patient confirmed that he knew not to take his insulin if he did not plan on eating.    4. The patient needs a PCP. We provided  a [facility] to help him find a PCP in the [place] area currently accepting Medicare patients.  5.  Labs / imaging needed at time of follow-up: BMET, CBC, Tacrolimus Level, Blood Glucose  6.  Pending labs/ test needing follow-up: Repeat blood cultures on Hospital Day 3 are negative x1 day, but will need to ensure that places remain sterile.   Follow-up Appointments:     Follow-up Information    Follow up with Clista Bernhardt, MD. Schedule an appointment as soon as possible for a visit on 08/16/2015.   Specialty:  Endocrinology   Why:  Diabetes management   Contact information:   [place] [id number] [phone]       Schedule an appointment as soon as possible for a visit with [facility].   Why:  Nephrology  follow-up. I have called the office and said they will call you for an appointment shortly.   Contact information:   [place] [address] [phone]       Please follow up.   Why:  Names of local doctors accepting patients for primary care call [facility] at 1 [phone]      Discharge Instructions: Discharge Instructions    Diet - low sodium heart healthy    Complete by:  As directed      Increase activity slowly    Complete by:  As directed            Consultations: Treatment Team:  Roney Jaffe, MD  Procedures Performed:  Dg Chest 2 View  08/13/2015  CLINICAL DATA:  Subacute onset of fever, nausea, vomiting, diarrhea and chills. Initial encounter. EXAM: CHEST  2 VIEW COMPARISON:  Chest radiograph performed 02/22/2004 FINDINGS: The lungs are well-aerated. Mild peribronchial thickening is noted. There is no evidence of focal opacification, pleural effusion or pneumothorax. The heart is normal in size; the mediastinal contour is within normal limits. No acute osseous abnormalities are seen. Clips are noted within the right upper quadrant, reflecting prior cholecystectomy. IMPRESSION: Mild peribronchial thickening noted.  Lungs otherwise clear. Electronically Signed   By: Garald Balding M.D.   On: 08/13/2015 01:29   US Renal Transplant W/doppler  08/14/2015  CLINICAL DATA:  Acute on chronic renal failure, in renal transplant EXAM: ULTRASOUND OF RENAL TRANSPLANT WITH DOPPLER TECHNIQUE: Ultrasound examination of the renal transplant was performed with gray-scale, color and duplex doppler evaluation. COMPARISON:  04/10/2014 FINDINGS: Transplant kidney location:  Right lower quadrant Transplant kidney: Length: 11.5 cm. Normal in size and parenchymal echogenicity. No evidence of mass or hydronephrosis. Color flow in the main renal artery:  Present Color flow in the main renal vein:  Present Duplex Doppler Evaluation Resistive index in main renal artery:  0.65 Venous waveform in main renal vein:  present Resistive index in upper pole intrarenal artery:  0.63 (normal 0.6-0.8; equivocal 0.8-0.9; abnormal > 0.9) Resistive index in lower pole intrarenal artery: 0.64 (normal 0.6-0.8; equivocal 0.8-0.9; abnormal > 0.9) Bladder: Normal for degree of bladder distention. Other findings:  No abnormalities in the transplant bed. IMPRESSION: Normal study Electronically Signed   By: Skipper Cliche M.D.   On: 08/14/2015 15:21   Dg Chest Port 1 View  08/14/2015  CLINICAL DATA:  Shortness of breath and dyspnea on exertion today. Chronic renal failure. EXAM: PORTABLE CHEST 1 VIEW COMPARISON:  08/13/2015 FINDINGS: The heart is borderline enlarged but stable. There is mild tortuosity of the thoracic aorta. The lungs are clear. No pleural  effusion. The bony thorax is intact. IMPRESSION: No acute cardiopulmonary findings. Electronically Signed   By: Marijo Sanes M.D.   On: 08/14/2015 13:36    2D Echo:   - Left ventricle: The cavity size was normal. There was mild concentric hypertrophy. Systolic function was normal. The estimated ejection fraction was in the range of 55% to 60%. Wall motion was normal; there were no regional wall motion abnormalities. Doppler parameters are consistent with abnormal left ventricular relaxation (grade 1 diastolic dysfunction). - Aortic valve: Transvalvular velocity was within the normal range. There was no stenosis. There was no regurgitation. - Mitral valve: Transvalvular velocity was within the normal range. There was no evidence for stenosis. There was no regurgitation. - Left atrium: The atrium was severely dilated. - Right ventricle: The cavity size was normal. Wall thickness was normal. Systolic function was normal. - Tricuspid valve: There was no regurgitation. - Pulmonic valve: Transvalvular velocity was within the normal range. There was no evidence for stenosis. There was  no regurgitation.  Admission HPI: Mr. 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 He was in his normal state of health until last weekend (1/14) when he started to have episodes of vomiting and diarrhea. At first, the vomit was food he had eaten which transitioned to small yellow volumes. He has had no appetite and cannot keep anything down. As a result, he has not taken any immunosuppressant therapy, or any medications whatsoever, in at least a week. He has at least three watery, greenish, non-bloody bowel movements a day. He also endorse a fever (recorded 102F at home), chills, and night sweats. He also describes generalized weakness and light-headedness upon standing. He is able to make urine, albeit minimal in the setting of poor oral intake. He only drinks distilled water. 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 He denies any camping or recent travel. Otherwise he denies any dyspnea, chest pain, palpitations, blackouts, one-sided weakness, sore throat, cough, congestion, headaches, abdominal pain, constipation, blood in stool, skin changes, vision changes, or confusion. He smokes approximately 7 cigarettes a day. He does not use alcohol or illicit drugs. He is on disability and lives alone in an apartment in [place]. He had wanted to go to [facility], where he receives care for his renal transplant, but the hospital was at capacity.  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 He has been on alternative regimens previously, including rituximab, sirolimus, and everolimus.  In the ED, the patient was tachycardic to 125, BP 117/79, with a temperature of 101.1. He was also noted to have a creatinine of 4.13 over his baseline of ~2.5. He had no leukocytosis or lactic acidosis.   Hospital Course by problem list:  Salmonella Sepsis Secondary to Infectious Colitis: On day of admission, the patient was found to have gram negative rods growing in his blood, which speciated to Salmonella that was sensitive to bactrim, levaquin, and ampicillin. Throughout his hospital course, he was treated with IV piperacillin-tazobactam. His appetite  improved and was able to eat meals, and his stool became more formed. On admission, he had a temperature of 102 and was afebrile by the second hospital day. Repeat blood cultures were obtained on 1/23 which showed no growth by the day of discharge. Upon discharge, he was started on ciprofloxacin 500 mg daily, lower than 750 mg given his renal function, for a 14 day course.   AKI in setting of Renal Transplant on Immunosuppression: With fluid resuscitation and encouraging oral fluid intake, creatinine improved significantly during the admission: 4.13>3.8 >3.54 >3.2>3.13. Despite his inability to take his tacrolimus and prednisone in the setting of GI illness prior to his admission, it was surmised that rejection did not underlie his AKI given his improvement on IVF and normal renal transplant ultrasound. Nephrology followed Mr. Hamada while he was an inpatient. He was initially on a modified immunosuppressant course to aid with systemic absorption in the setting of GI illness (Tacrolimus SL 1 mg in AM, 0.5 mg SL in PM, Hydrocortisone 25 mg q8h IV) but was transitioned to his home tacrolimus 2 mg AM, 1 mg PM, and prednisone 5 mg daily.  A tacrolimus level was 11.6, drawn on admission, but was after he had received a  dose and did not represent a trough value. He was also continued on  Valacyclovir 500 mg daily, Sodium bicarbonate 1.3 g TID. His urine output increased from 200 mL daily to 1.05L daily.  Anemia of Chronic Disease: The patient was noted to have a Hgb 11.3 but a steadily decreasing hemoglobin to 7.9 on 1/23, which had improved to 8.7 by the afternoon. There were no bleeding episodes and there was was likely a component of hemodilution on IVF given that other cell lines were down. Baseline appeared to be 11-12 based on Care Everywhere. He may be an Aranesp candidate.   T2DM: On a sliding scale resistant regimen only, CBGs remained 103-157 even after resuming a carb-modified/renal diet. He required no more than 4U per meal. After he confirmed that he tended to eat more carbohydrates at home, he was discharged on his home regimen of  40U Lantus QHS with Humalog 15U TID before meals at home.  HTN: Antihypertensives were held at the beginning of the hospitalization due to hypovolemia but he was hypertensive by 1/23 to 164/91. He was started on amlodipine 5 mg daily and his BP responded to 136/68. He was discharged on this medication. Home lisinopril 5 mg was held on discharge in the setting of a resolving AKI.   Discharge Vitals:   BP 136/68 mmHg  Pulse 93  Temp(Src) 99.4 F (37.4 C) (Oral)  Resp 20  Ht 5\' 7"  (1.702 m)  Wt 145 lb 15.1 oz (66.2 kg)  BMI 22.85 kg/m2  SpO2 98%  Discharge Labs:  Results for orders placed or performed during the hospital encounter of 08/13/15 (from the past 24 hour(s))  CBC     Status: Abnormal   Collection Time: 08/15/15  4:10 PM  Result Value Ref Range   WBC 6.6 4.0 - 10.5 K/uL   RBC 2.78 (L) 4.22 - 5.81 MIL/uL   Hemoglobin 8.7 (L) 13.0 - 17.0 g/dL   HCT 25.4 (L) 39.0 - 52.0 %   MCV 91.4 78.0 - 100.0 fL   MCH 31.3 26.0 - 34.0 pg   MCHC 34.3 30.0 - 36.0 g/dL   RDW 13.0 11.5 - 15.5 %   Platelets 159 150 - 400 K/uL  Glucose, capillary     Status:  Abnormal    Collection Time: 08/15/15  6:34 PM  Result Value Ref Range   Glucose-Capillary 161 (H) 65 - 99 mg/dL  Glucose, capillary     Status: Abnormal   Collection Time: 08/16/15 12:21 AM  Result Value Ref Range   Glucose-Capillary 114 (H) 65 - 99 mg/dL  Glucose, capillary     Status: Abnormal   Collection Time: 08/16/15  8:17 AM  Result Value Ref Range   Glucose-Capillary 105 (H) 65 - 99 mg/dL  CBC     Status: Abnormal   Collection Time: 08/16/15  8:55 AM  Result Value Ref Range   WBC 6.2 4.0 - 10.5 K/uL   RBC 2.70 (L) 4.22 - 5.81 MIL/uL   Hemoglobin 8.6 (L) 13.0 - 17.0 g/dL   HCT 25.0 (L) 39.0 - 52.0 %   MCV 92.6 78.0 - 100.0 fL   MCH 31.9 26.0 - 34.0 pg   MCHC 34.4 30.0 - 36.0 g/dL   RDW 13.2 11.5 - 15.5 %   Platelets 144 (L) 150 - 400 K/uL  Renal function panel     Status: Abnormal   Collection Time: 08/16/15  8:55 AM  Result Value Ref Range   Sodium 139 135 - 145 mmol/L   Potassium 3.6 3.5 - 5.1 mmol/L   Chloride 112 (H) 101 - 111 mmol/L   CO2 18 (L) 22 - 32 mmol/L   Glucose, Bld 150 (H) 65 - 99 mg/dL   BUN 10 6 - 20 mg/dL   Creatinine, Ser 3.13 (H) 0.61 - 1.24 mg/dL   Calcium 8.5 (L) 8.9 - 10.3 mg/dL   Phosphorus 2.3 (L) 2.5 - 4.6 mg/dL   Albumin 2.6 (L) 3.5 - 5.0 g/dL   GFR calc non Af Amer 23 (L) >60 mL/min   GFR calc Af Amer 27 (L) >60 mL/min   Anion gap 9 5 - 15  Glucose, capillary     Status: Abnormal   Collection Time: 08/16/15 11:50 AM  Result Value Ref Range   Glucose-Capillary 123 (H) 65 - 99 mg/dL    Signed: Liberty Handy, MD 08/16/2015, 1:04 PM

## 2015-08-16 NOTE — Progress Notes (Addendum)
Pt states he can not find his keys; per pt the keys are on ring attached to a green [employer] lanyard. Pt and pt's nurse looked in the pt room in an attempt to find the keys; unsuccessful. Per pt, he remembers using his keys to lock his door when he left his home via ambulance service. Called ED to see if there were any keys found but according to ED staff, they send items left behind to security. Spoke with Security in regards to pt's missing keys; per security, there have not been any keys matching the pt's description turned in.

## 2015-08-16 NOTE — Discharge Instructions (Signed)
James [name], it was a pleasure taking care of you in the hospital. You were here for a Salmonella infection in your gut that had spread to your blood. We treated you with IV antibiotics, and your symptoms improved. You will need to continue to take antibiotics at home to complete the course. You will take 500 mg ciprofloxacin TWICE a day with your last dose on 08/30/2015. If your diarrhea or nausea recurs or worsens please seek medical attention. If you have recurrent or worsening fever, chills, or night sweats, please seek medical attention. If you notice your urine output decreases significantly, you are newly short of breath, or you notice swelling in your extremities, please seek medical attention.  Please HOLD your lisinopril until you can follow up with your kidney doctor and get your renal function checked. I've started amlodipine 5 mg daily for blood pressure control.  I encourage you to find a primary care doctor in the area for management of your hypertension and general healthcare. You can follow-up with your endocrinologist, Dr. Dorian Heckle, for management of your diabetes. Dr. Shawn Route office and your renal transplant doctor should be calling you to set up an appointment, but please call on your own as well to make sure.   I will follow your blood cultures and give you a call at the number you provided if we need to adjust your antibiotic regimen.

## 2015-08-16 NOTE — Progress Notes (Addendum)
Subjective:  [name] says he feels well enough to go home and that his stools is more formed. He also says he is able to urinate more and that his kidney's are coming "back online." He has had no recent episodes of emesis. He is also able to eat his meals to the same degree he was eating before the onset of this GI illness.   Objective: Vital signs in last 24 hours: Filed Vitals:   08/15/15 1538 08/15/15 1746 08/15/15 2014 08/16/15 0510  BP: 136/86 130/84 124/71 136/68  Pulse: 88 90 107 93  Temp:  98 F (36.7 C) 97.7 F (36.5 C) 99.4 F (37.4 C)  TempSrc:  Oral Oral Oral  Resp:  18 20 20   Height:      Weight:      SpO2:  98% 100% 98%   Weight change:   Intake/Output Summary (Last 24 hours) at 08/16/15 0955 Last data filed at 08/16/15 0900  Gross per 24 hour  Intake 2507.5 ml  Output    850 ml  Net 1657.5 ml   Physical Exam:  General: Lying in bed, NAD HEENT: Moist mucous membranes, no tonsillar exudate or erythema Cardiovascular: RRR. No murmurs, rubs, or gallops Pulmonary: Clear to auscultation bilaterally. Unlabored breathing Abdominal: Obese, soft, NT/ND.Normal bowel sounds. Extremities: No clubbing, cyanosis, or edema Skin: Warm and dry. No rashes Neurological: Tongue midline, face symmetric. Psychiatric: Normal behavior and affect    Lab Results: Basic Metabolic Panel:  Recent Labs Lab 08/14/15 1223 08/15/15 0720  NA 140 141  K 4.1 3.8  CL 111 114*  CO2 17* 18*  GLUCOSE 130* 100*  BUN 17 13  CREATININE 3.54* 3.20*  CALCIUM 9.0 8.9  PHOS 2.7 1.8*   Liver Function Tests:  Recent Labs Lab 08/13/15 0131  08/14/15 1223 08/15/15 0720  AST 44*  --   --   --   ALT 37  --   --   --   ALKPHOS 96  --   --   --   BILITOT 0.5  --   --   --   PROT 7.9  --   --   --   ALBUMIN 3.4*  < > 2.8* 2.6*  < > = values in this interval not displayed. CBC:  Recent Labs Lab 08/13/15 0131  08/15/15 0720 08/15/15 1610  WBC 9.2  < > 5.2 6.6  NEUTROABS  6.4  --   --   --   HGB 11.3*  < > 7.9* 8.7*  HCT 33.7*  < > 22.8* 25.4*  MCV 94.1  < > 90.1 91.4  PLT 182  < > 166 159  < > = values in this interval not displayed.  Recent Labs Lab 08/14/15 2154 08/15/15 0739 08/15/15 1136 08/15/15 1834 08/16/15 0021 08/16/15 0817  GLUCAP 136* 109* 103* 161* 114* 105*   Micro Results: Recent Results (from the past 240 hour(s))  Culture, blood (routine x 2)     Status: None (Preliminary result)   Collection Time: 08/13/15  1:15 AM  Result Value Ref Range Status   Specimen Description BLOOD RIGHT ARM  Final   Special Requests   Final    BOTTLES DRAWN AEROBIC AND ANAEROBIC 5ML ANA 10ML AER   Culture  Setup Time   Final    GRAM NEGATIVE RODS IN BOTH AEROBIC AND ANAEROBIC BOTTLES CRITICAL RESULT CALLED TO, READ BACK BY AND VERIFIED WITH: RN MURPHY,V CONFIRMED BY V Jackson  Final    SALMONELLA SPECIES SUSCEPTIBILITIES PERFORMED ON PREVIOUS CULTURE WITHIN THE LAST 5 DAYS. RESULT CALLED TO, READ BACK BY AND VERIFIED WITH: T PENNINGTON 08/15/15 @ 64 M VESTAL    Report Status PENDING  Incomplete  Culture, blood (routine x 2)     Status: None (Preliminary result)   Collection Time: 08/13/15  1:21 AM  Result Value Ref Range Status   Specimen Description BLOOD RIGHT HAND  Final   Special Requests IN PEDIATRIC BOTTLE 5ML  Final   Culture  Setup Time   Final    GRAM NEGATIVE RODS IN PEDIATRIC BOTTLE CRITICAL RESULT CALLED TO, READ BACK BY AND VERIFIED WITH: RN SHARPE,H    Culture   Final    SALMONELLA SPECIES RESULT CALLED TO, READ BACK BY AND VERIFIED WITH: T PENNINGTON 08/15/15 @ 76 M VESTAL    Report Status PENDING  Incomplete   Organism ID, Bacteria SALMONELLA SPECIES  Final      Susceptibility   Salmonella species - MIC*    AMPICILLIN <=2 SENSITIVE Sensitive     LEVOFLOXACIN <=0.12 SENSITIVE Sensitive     TRIMETH/SULFA <=20 SENSITIVE Sensitive     * SALMONELLA SPECIES  Urine culture     Status: None   Collection  Time: 08/13/15  1:29 AM  Result Value Ref Range Status   Specimen Description URINE, CLEAN CATCH  Final   Special Requests NONE  Final   Culture MULTIPLE SPECIES PRESENT, SUGGEST RECOLLECTION  Final   Report Status 08/15/2015 FINAL  Final  C difficile quick scan w PCR reflex     Status: None   Collection Time: 08/13/15  2:27 PM  Result Value Ref Range Status   C Diff antigen NEGATIVE NEGATIVE Final   C Diff toxin NEGATIVE NEGATIVE Final   C Diff interpretation Negative for toxigenic C. difficile  Final  Culture, blood (routine x 2)     Status: None (Preliminary result)   Collection Time: 08/15/15 12:02 PM  Result Value Ref Range Status   Specimen Description BLOOD LEFT ARM  Final   Special Requests BOTTLES DRAWN AEROBIC AND ANAEROBIC 10 CC EACH  Final   Culture NO GROWTH < 24 HOURS  Final   Report Status PENDING  Incomplete  Culture, blood (routine x 2)     Status: None (Preliminary result)   Collection Time: 08/15/15 12:02 PM  Result Value Ref Range Status   Specimen Description BLOOD LEFT ARM  Final   Special Requests BOTTLES DRAWN AEROBIC AND ANAEROBIC  10 CC EACH  Final   Culture NO GROWTH < 24 HOURS  Final   Report Status PENDING  Incomplete   Studies/Results: US Renal Transplant W/doppler  08/14/2015  CLINICAL DATA:  Acute on chronic renal failure, in renal transplant EXAM: ULTRASOUND OF RENAL TRANSPLANT WITH DOPPLER TECHNIQUE: Ultrasound examination of the renal transplant was performed with gray-scale, color and duplex doppler evaluation. COMPARISON:  04/10/2014 FINDINGS: Transplant kidney location:  Right lower quadrant Transplant kidney: Length: 11.5 cm. Normal in size and parenchymal echogenicity. No evidence of mass or hydronephrosis. Color flow in the main renal artery:  Present Color flow in the main renal vein:  Present Duplex Doppler Evaluation Resistive index in main renal artery: 0.65 Venous waveform in main renal vein:  present Resistive index in upper pole intrarenal  artery:  0.63 (normal 0.6-0.8; equivocal 0.8-0.9; abnormal > 0.9) Resistive index in lower pole intrarenal artery: 0.64 (normal 0.6-0.8; equivocal 0.8-0.9; abnormal > 0.9) Bladder: Normal for degree of bladder distention.  Other findings:  No abnormalities in the transplant bed. IMPRESSION: Normal study Electronically Signed   By: Skipper Cliche M.D.   On: 08/14/2015 15:21   Dg Chest Port 1 View  08/14/2015  CLINICAL DATA:  Shortness of breath and dyspnea on exertion today. Chronic renal failure. EXAM: PORTABLE CHEST 1 VIEW COMPARISON:  08/13/2015 FINDINGS: The heart is borderline enlarged but stable. There is mild tortuosity of the thoracic aorta. The lungs are clear. No pleural effusion. The bony thorax is intact. IMPRESSION: No acute cardiopulmonary findings. Electronically Signed   By: Marijo Sanes M.D.   On: 08/14/2015 13:36   Medications: I have reviewed the patient's current medications. Scheduled Meds: . amLODipine  5 mg Oral Daily  . heparin  5,000 Units Subcutaneous 3 times per day  . insulin aspart  0-20 Units Subcutaneous TID WC  . insulin aspart  0-5 Units Subcutaneous QHS  . multivitamin with minerals  1 tablet Oral Daily  . pantoprazole  80 mg Oral Daily  . piperacillin-tazobactam (ZOSYN)  IV  3.375 g Intravenous Q8H  . [START ON 08/17/2015] predniSONE  5 mg Oral Q breakfast  . sodium bicarbonate  1,300 mg Oral TID  . tacrolimus  1-2 mg Oral BID  . tamsulosin  0.4 mg Oral QPM  . valACYclovir  500 mg Oral BH-q7a   Continuous Infusions: . sodium chloride 50 mL/hr at 08/15/15 1527   PRN Meds:.acetaminophen, ondansetron (ZOFRAN) IV **OR** ondansetron (ZOFRAN) IV, zolpidem Assessment/Plan:  Salmonella Sepsis Secondary to Infectious Colitis: Sensitive to ampicillin, levofloxacin, bactrim (would not use in this patient). Likely source is an infectious bacterial colitis in the setting of immunosuppression. He is responding well to IV antibiotics and has been afebrile. Will continue  IV antibiotics today and transition to oral antibiotics on discharge. Blood cultures are negative x1 day. Spoke with Dr. Linus Salmons from ID who recommends ciprofloxacin two weeks from last negative blood culture. TTE shows no evidence of endocarditis. - Piperacillin-Tazobactam per pharm - Monitor fever curve and symptom improvement - Repeat Blood cultures (1/23) negative to date  AKI in setting of Renal Transplant on Immunosuppression: Likely pre-renal in setting of diarrhea and poor oral intake, low suspicion for rejection at this time given normal renal ultrasound and improvement on IVF. Creatinine improvement 4.13>3.8 >3.54 >3.2. Will reduce fluid rate today. Nephrology following, and we appreciate recommendations. Tacrolimus level was 11.6, drawn on admission, but was after he had received a dose.  - NS 50 cc/hr - Tacrolimus 2 mg po AM and 1 mg po in evening - Prednisone 5 mg daily - Valacyclovir 500 mg daily- Predn - Sodium bicarbonate 1.3 g TID - RFP in AM  Anemia of Chronic Disease: Hgb 11.3 on admission to 7.9 yesterday, which improved to 8.7 by the afternoon. No bleeding episodes and likely a component of hemodilution given that other cell lines are down. Baseline appears to be 11-12 based on Care Everywhere. - May need ESA  T2DM: Glucose 100s while eating 100% of meals. On 40U Lantus QHS with Humalog 15U TID before meals at home. - Resistant SSI with HS correction only  HTN: 136/68. On Metoprolol 75 mg BID and lisinopril 5 mg BID at home. - Started amlodipine 5 mg, BP improving - Holding other antihypertensives for now  BPH: Continue home tamsulosin 0.4 mg qhs  Insomnia: Continue home zolpidem 5 mg nightly prn.  DVT Prophylaxis: Heparin [place]  Dispo: Anticipated discharge today or tomorrow depending on improvement in renal function  The patient does  not have a current PCP (No primary care provider on file.) and does not need an [facility] hospital follow-up appointment after  discharge.  The patient does have transportation limitations that hinder transportation to clinic appointments.  .Services Needed at time of discharge: Y = Yes, Blank = No PT:   OT:   RN:   Equipment:   Other:     LOS: 2 days   Liberty Handy, MD 08/16/2015, 9:55 AM

## 2015-08-16 NOTE — Progress Notes (Signed)
Patient ID: James [name] [facility], male   DOB: 1970-09-15, 45 y.o.   MRN: [id number] S:feeling better O:BP 136/68 mmHg  Pulse 93  Temp(Src) 99.4 F (37.4 C) (Oral)  Resp 20  Ht 5\' 7"  (1.702 m)  Wt 66.2 kg (145 lb 15.1 oz)  BMI 22.85 kg/m2  SpO2 98%  Intake/Output Summary (Last 24 hours) at 08/16/15 0930 Last data filed at 08/16/15 0900  Gross per 24 hour  Intake 2507.5 ml  Output    850 ml  Net 1657.5 ml   Intake/Output: I/O last 3 completed shifts: In: 7797.5 [P.O.:1920; I.V.:5677.5; IV Piggyback:200] Out: 1725 [Urine:1725]  Intake/Output this shift:  Total I/O In: 240 [P.O.:240] Out: -  Weight change:  Gen:WD WN AAM in NAD CVS:no rub, tachy Resp:cta Abd:+BS, soft Ext:no edema, clotted AVF in LUE ext   Recent Labs Lab 08/13/15 0131 08/13/15 1730 08/14/15 1223 08/15/15 0720 08/16/15 0855  NA 135 137 140 141 139  K 4.5 4.2 4.1 3.8 3.6  CL 104 110 111 114* 112*  CO2 17* 17* 17* 18* 18*  GLUCOSE 102* 94 130* 100* 150*  BUN 27* 23* 17 13 10   CREATININE 4.13* 3.80* 3.54* 3.20* 3.13*  ALBUMIN 3.4* 2.8* 2.8* 2.6* 2.6*  CALCIUM 9.5 8.8* 9.0 8.9 8.5*  PHOS  --  2.6 2.7 1.8* 2.3*  AST 44*  --   --   --   --   ALT 37  --   --   --   --    Liver Function Tests:  Recent Labs Lab 08/13/15 0131  08/14/15 1223 08/15/15 0720 08/16/15 0855  AST 44*  --   --   --   --   ALT 37  --   --   --   --   ALKPHOS 96  --   --   --   --   BILITOT 0.5  --   --   --   --   PROT 7.9  --   --   --   --   ALBUMIN 3.4*  < > 2.8* 2.6* 2.6*  < > = values in this interval not displayed. No results for input(s): LIPASE, AMYLASE in the last 168 hours. No results for input(s): AMMONIA in the last 168 hours. CBC:  Recent Labs Lab 08/13/15 0131 08/14/15 1223 08/15/15 0720 08/15/15 1610 08/16/15 0855  WBC 9.2 4.2 5.2 6.6 6.2  NEUTROABS 6.4  --   --   --   --   HGB 11.3* 9.3* 7.9* 8.7* 8.6*  HCT 33.7* 27.1* 22.8* 25.4* 25.0*  MCV 94.1 92.8 90.1 91.4 92.6  PLT 182 145* 166 159 144*    Cardiac Enzymes: No results for input(s): CKTOTAL, CKMB, CKMBINDEX, TROPONINI in the last 168 hours. CBG:  Recent Labs Lab 08/15/15 1136 08/15/15 1834 08/16/15 0021 08/16/15 0817 08/16/15 1150  GLUCAP 103* 161* 114* 105* 123*    Iron Studies: No results for input(s): IRON, TIBC, TRANSFERRIN, FERRITIN in the last 72 hours. Studies/Results: US Renal Transplant W/doppler  08/14/2015  CLINICAL DATA:  Acute on chronic renal failure, in renal transplant EXAM: ULTRASOUND OF RENAL TRANSPLANT WITH DOPPLER TECHNIQUE: Ultrasound examination of the renal transplant was performed with gray-scale, color and duplex doppler evaluation. COMPARISON:  04/10/2014 FINDINGS: Transplant kidney location:  Right lower quadrant Transplant kidney: Length: 11.5 cm. Normal in size and parenchymal echogenicity. No evidence of mass or hydronephrosis. Color flow in the main renal artery:  Present Color flow in the main renal vein:  Present Duplex Doppler Evaluation Resistive index in main renal artery: 0.65 Venous waveform in main renal vein:  present Resistive index in upper pole intrarenal artery:  0.63 (normal 0.6-0.8; equivocal 0.8-0.9; abnormal > 0.9) Resistive index in lower pole intrarenal artery: 0.64 (normal 0.6-0.8; equivocal 0.8-0.9; abnormal > 0.9) Bladder: Normal for degree of bladder distention. Other findings:  No abnormalities in the transplant bed. IMPRESSION: Normal study Electronically Signed   By: Skipper Cliche M.D.   On: 08/14/2015 15:21   Dg Chest Port 1 View  08/14/2015  CLINICAL DATA:  Shortness of breath and dyspnea on exertion today. Chronic renal failure. EXAM: PORTABLE CHEST 1 VIEW COMPARISON:  08/13/2015 FINDINGS: The heart is borderline enlarged but stable. There is mild tortuosity of the thoracic aorta. The lungs are clear. No pleural effusion. The bony thorax is intact. IMPRESSION: No acute cardiopulmonary findings. Electronically Signed   By: Marijo Sanes M.D.   On: 08/14/2015 13:36   .  amLODipine  5 mg Oral Daily  . heparin  5,000 Units Subcutaneous 3 times per day  . insulin aspart  0-20 Units Subcutaneous TID WC  . insulin aspart  0-5 Units Subcutaneous QHS  . multivitamin with minerals  1 tablet Oral Daily  . pantoprazole  80 mg Oral Daily  . piperacillin-tazobactam (ZOSYN)  IV  3.375 g Intravenous Q8H  . [START ON 08/17/2015] predniSONE  5 mg Oral Q breakfast  . sodium bicarbonate  1,300 mg Oral TID  . tacrolimus  2 mg Oral Daily   And  . tacrolimus  1 mg Oral QHS  . tamsulosin  0.4 mg Oral QPM  . valACYclovir  500 mg Oral BH-q7a    BMET    Component Value Date/Time   NA 141 08/15/2015 0720   K 3.8 08/15/2015 0720   CL 114* 08/15/2015 0720   CO2 18* 08/15/2015 0720   GLUCOSE 100* 08/15/2015 0720   BUN 13 08/15/2015 0720   CREATININE 3.20* 08/15/2015 0720   CALCIUM 8.9 08/15/2015 0720   GFRNONAA 22* 08/15/2015 0720   GFRAA 26* 08/15/2015 0720   CBC    Component Value Date/Time   WBC 6.6 08/15/2015 1610   RBC 2.78* 08/15/2015 1610   HGB 8.7* 08/15/2015 1610   HCT 25.4* 08/15/2015 1610   PLT 159 08/15/2015 1610   MCV 91.4 08/15/2015 1610   MCH 31.3 08/15/2015 1610   MCHC 34.3 08/15/2015 1610   RDW 13.0 08/15/2015 1610   LYMPHSABS 1.8 08/13/2015 0131   MONOABS 1.0 08/13/2015 0131   EOSABS 0.0 08/13/2015 0131   BASOSABS 0.0 08/13/2015 0131     Assessment/Plan:  1. AKI/CKD s/p renal transplant in setting of volume depletion due to N/V/D in setting of ACE inhibitor. Improving with IVF's. Baseline Scr ~2.5-2.8. Pt wanting to go home today.  Steady improvement in Scr and ok to f/u with [facility] transplant office as an outpatient.  1. Continue to hold lisinopril until he can f/u with his Nephrologist and recheck renal panel. 2. He can re-establish care with our group if he will remain in [place] (initially seen by Dr. Justin Mend but also had HD at [facility] and followed seen by Drs. Mattingly and Deterding) before moving to [place] 2. Immunosuppressive  therapy- prograf level was above goal trough level of 4-6, however this was not a trough level as it was drawn an hour after oral administration.  Ok to cont with current dose and f/u with his primary nephrologist at [facility]  3. Urinary retention- will  check bladder scan and I&O cath if >348ml. 4. Diarrhea/vomiting/fever- on empiric abx per primary. Cultures pending 5. HTN- elevated off of ACE. Could start coreg or amlodipine and follow but would continue to hold ACE for now. 6. Anemia of chronic disease- may require ESA 7. DM- per primary 8. 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 On prograf. Advance to po meds once able to tolerate po. 9. Dispo- discharge to home per primary svc.  Will sign off.  [place]

## 2015-08-16 NOTE — Progress Notes (Signed)
Patient Discharge: Disposition: Patient discharged to home. Education: Reviewed discharge instructions, follow-up appointments, medications, prescriptions and also given handouts on sepsis, acute renal failure, and also on Salmonella gastritis. IV: Discontinued IV before discharge. Telemetry: N/A Transportation: Patient transported in w/c out of the unit accompanied by the staff member. Belongings: Patient took all her belongings with him.

## 2015-08-20 LAB — CULTURE, BLOOD (ROUTINE X 2)
Culture: NO GROWTH
Culture: NO GROWTH

## 2015-09-07 LAB — CULTURE, BLOOD (ROUTINE X 2)

## 2015-09-08 LAB — CULTURE, BLOOD (ROUTINE X 2)

## 2015-11-16 ENCOUNTER — Other Ambulatory Visit: Payer: Self-pay | Admitting: Internal Medicine

## 2016-04-17 DIAGNOSIS — N189 Chronic kidney disease, unspecified: Secondary | ICD-10-CM | POA: Insufficient documentation

## 2016-04-17 DIAGNOSIS — D631 Anemia in chronic kidney disease: Secondary | ICD-10-CM | POA: Insufficient documentation

## 2017-04-19 ENCOUNTER — Inpatient Hospital Stay (HOSPITAL_COMMUNITY): Payer: Medicare Other

## 2017-04-19 ENCOUNTER — Emergency Department (HOSPITAL_COMMUNITY): Payer: Medicare Other

## 2017-04-19 ENCOUNTER — Encounter (HOSPITAL_COMMUNITY): Payer: Self-pay | Admitting: Emergency Medicine

## 2017-04-19 ENCOUNTER — Inpatient Hospital Stay (HOSPITAL_COMMUNITY)
Admission: EM | Admit: 2017-04-19 | Discharge: 2017-04-25 | DRG: 871 | Disposition: A | Discharge status: Home Health | Payer: Medicare Other | Attending: Family Medicine | Admitting: Family Medicine

## 2017-04-19 DIAGNOSIS — Y830 Surgical operation with transplant of whole organ as the cause of abnormal reaction of the patient, or of later complication, without mention of misadventure at the time of the procedure: Secondary | ICD-10-CM | POA: Diagnosis present

## 2017-04-19 DIAGNOSIS — I1311 Hypertensive heart and chronic kidney disease without heart failure, with stage 5 chronic kidney disease, or end stage renal disease: Secondary | ICD-10-CM | POA: Diagnosis present

## 2017-04-19 DIAGNOSIS — J9601 Acute respiratory failure with hypoxia: Secondary | ICD-10-CM | POA: Diagnosis present

## 2017-04-19 DIAGNOSIS — N189 Chronic kidney disease, unspecified: Secondary | ICD-10-CM | POA: Diagnosis not present

## 2017-04-19 DIAGNOSIS — Y95 Nosocomial condition: Secondary | ICD-10-CM | POA: Diagnosis present

## 2017-04-19 DIAGNOSIS — R6520 Severe sepsis without septic shock: Secondary | ICD-10-CM | POA: Diagnosis present

## 2017-04-19 DIAGNOSIS — A419 Sepsis, unspecified organism: Secondary | ICD-10-CM | POA: Insufficient documentation

## 2017-04-19 DIAGNOSIS — E43 Unspecified severe protein-calorie malnutrition: Secondary | ICD-10-CM | POA: Diagnosis present

## 2017-04-19 DIAGNOSIS — E872 Acidosis: Secondary | ICD-10-CM | POA: Diagnosis present

## 2017-04-19 DIAGNOSIS — G92 Toxic encephalopathy: Secondary | ICD-10-CM | POA: Diagnosis present

## 2017-04-19 DIAGNOSIS — J189 Pneumonia, unspecified organism: Secondary | ICD-10-CM | POA: Diagnosis not present

## 2017-04-19 DIAGNOSIS — Z940 Kidney transplant status: Secondary | ICD-10-CM | POA: Diagnosis not present

## 2017-04-19 DIAGNOSIS — Z794 Long term (current) use of insulin: Secondary | ICD-10-CM

## 2017-04-19 DIAGNOSIS — E1122 Type 2 diabetes mellitus with diabetic chronic kidney disease: Secondary | ICD-10-CM | POA: Diagnosis present

## 2017-04-19 DIAGNOSIS — G9340 Encephalopathy, unspecified: Secondary | ICD-10-CM | POA: Diagnosis not present

## 2017-04-19 DIAGNOSIS — I120 Hypertensive chronic kidney disease with stage 5 chronic kidney disease or end stage renal disease: Secondary | ICD-10-CM | POA: Diagnosis present

## 2017-04-19 DIAGNOSIS — K219 Gastro-esophageal reflux disease without esophagitis: Secondary | ICD-10-CM | POA: Diagnosis present

## 2017-04-19 DIAGNOSIS — T8612 Kidney transplant failure: Secondary | ICD-10-CM | POA: Diagnosis present

## 2017-04-19 DIAGNOSIS — Z8701 Personal history of pneumonia (recurrent): Secondary | ICD-10-CM | POA: Diagnosis not present

## 2017-04-19 DIAGNOSIS — R7989 Other specified abnormal findings of blood chemistry: Secondary | ICD-10-CM

## 2017-04-19 DIAGNOSIS — Z419 Encounter for procedure for purposes other than remedying health state, unspecified: Secondary | ICD-10-CM

## 2017-04-19 DIAGNOSIS — R740 Nonspecific elevation of levels of transaminase and lactic acid dehydrogenase [LDH]: Secondary | ICD-10-CM | POA: Diagnosis present

## 2017-04-19 DIAGNOSIS — N179 Acute kidney failure, unspecified: Secondary | ICD-10-CM | POA: Diagnosis present

## 2017-04-19 DIAGNOSIS — Z992 Dependence on renal dialysis: Secondary | ICD-10-CM | POA: Diagnosis not present

## 2017-04-19 DIAGNOSIS — N186 End stage renal disease: Secondary | ICD-10-CM | POA: Diagnosis present

## 2017-04-19 DIAGNOSIS — D631 Anemia in chronic kidney disease: Secondary | ICD-10-CM | POA: Diagnosis present

## 2017-04-19 DIAGNOSIS — D47Z1 Post-transplant lymphoproliferative disorder (PTLD): Secondary | ICD-10-CM | POA: Diagnosis present

## 2017-04-19 DIAGNOSIS — K760 Fatty (change of) liver, not elsewhere classified: Secondary | ICD-10-CM | POA: Diagnosis present

## 2017-04-19 DIAGNOSIS — Z23 Encounter for immunization: Secondary | ICD-10-CM

## 2017-04-19 DIAGNOSIS — C8510 Unspecified B-cell lymphoma, unspecified site: Secondary | ICD-10-CM | POA: Diagnosis present

## 2017-04-19 DIAGNOSIS — Z9115 Patient's noncompliance with renal dialysis: Secondary | ICD-10-CM

## 2017-04-19 DIAGNOSIS — J181 Lobar pneumonia, unspecified organism: Secondary | ICD-10-CM | POA: Diagnosis present

## 2017-04-19 DIAGNOSIS — R7401 Elevation of levels of liver transaminase levels: Secondary | ICD-10-CM

## 2017-04-19 DIAGNOSIS — Z888 Allergy status to other drugs, medicaments and biological substances status: Secondary | ICD-10-CM

## 2017-04-19 DIAGNOSIS — N185 Chronic kidney disease, stage 5: Secondary | ICD-10-CM | POA: Diagnosis not present

## 2017-04-19 DIAGNOSIS — Z7952 Long term (current) use of systemic steroids: Secondary | ICD-10-CM

## 2017-04-19 DIAGNOSIS — R778 Other specified abnormalities of plasma proteins: Secondary | ICD-10-CM

## 2017-04-19 DIAGNOSIS — Z452 Encounter for adjustment and management of vascular access device: Secondary | ICD-10-CM

## 2017-04-19 DIAGNOSIS — E876 Hypokalemia: Secondary | ICD-10-CM

## 2017-04-19 DIAGNOSIS — T8611 Kidney transplant rejection: Secondary | ICD-10-CM | POA: Diagnosis present

## 2017-04-19 DIAGNOSIS — Z79899 Other long term (current) drug therapy: Secondary | ICD-10-CM

## 2017-04-19 HISTORY — DX: Pneumonia, unspecified organism: J18.9

## 2017-04-19 LAB — RENAL FUNCTION PANEL
Albumin: 2.1 g/dL — ABNORMAL LOW (ref 3.5–5.0)
Albumin: 2.1 g/dL — ABNORMAL LOW (ref 3.5–5.0)
Anion gap: 10 (ref 5–15)
Anion gap: 12 (ref 5–15)
BUN: 54 mg/dL — ABNORMAL HIGH (ref 6–20)
BUN: 54 mg/dL — ABNORMAL HIGH (ref 6–20)
CALCIUM: 7.8 mg/dL — AB (ref 8.9–10.3)
CALCIUM: 7.8 mg/dL — AB (ref 8.9–10.3)
CO2: 13 mmol/L — AB (ref 22–32)
CO2: 11 mmol/L — ABNORMAL LOW (ref 22–32)
CREATININE: 11.37 mg/dL — AB (ref 0.61–1.24)
CREATININE: 11.44 mg/dL — AB (ref 0.61–1.24)
Chloride: 112 mmol/L — ABNORMAL HIGH (ref 101–111)
Chloride: 112 mmol/L — ABNORMAL HIGH (ref 101–111)
GFR calc non Af Amer: 5 mL/min — ABNORMAL LOW (ref >60)
GFR, EST AFRICAN AMERICAN: 6 mL/min — AB (ref >60)
GFR, EST AFRICAN AMERICAN: 5 mL/min — AB (ref >60)
GFR, EST NON AFRICAN AMERICAN: 5 mL/min — AB (ref >60)
Glucose, Bld: 164 mg/dL — ABNORMAL HIGH (ref 65–99)
Glucose, Bld: 162 mg/dL — ABNORMAL HIGH (ref 65–99)
PHOSPHORUS: 6.4 mg/dL — AB (ref 2.5–4.6)
Phosphorus: 6.3 mg/dL — ABNORMAL HIGH (ref 2.5–4.6)
Potassium: 3.5 mmol/L (ref 3.5–5.1)
Potassium: 3.5 mmol/L (ref 3.5–5.1)
SODIUM: 135 mmol/L (ref 135–145)
SODIUM: 135 mmol/L (ref 135–145)

## 2017-04-19 LAB — I-STAT VENOUS BLOOD GAS, ED
Acid-base deficit: 14 mmol/L — ABNORMAL HIGH (ref 0.0–2.0)
Bicarbonate: 11.1 mmol/L — ABNORMAL LOW (ref 20.0–28.0)
O2 SAT: 51 %
PCO2 VEN: 23 mmHg — AB (ref 44.0–60.0)
TCO2: 12 mmol/L — ABNORMAL LOW (ref 22–32)
pH, Ven: 7.291 (ref 7.250–7.430)
pO2, Ven: 29 mmHg — CL (ref 32.0–45.0)

## 2017-04-19 LAB — I-STAT TROPONIN, ED: TROPONIN I, POC: 0.19 ng/mL — AB (ref 0.00–0.08)

## 2017-04-19 LAB — CBC WITH DIFFERENTIAL/PLATELET
BASOS ABS: 0 10*3/uL (ref 0.0–0.1)
BASOS PCT: 0 %
Eosinophils Absolute: 0 10*3/uL (ref 0.0–0.7)
Eosinophils Relative: 0 %
HEMATOCRIT: 27.4 % — AB (ref 39.0–52.0)
HEMOGLOBIN: 9.6 g/dL — AB (ref 13.0–17.0)
LYMPHS PCT: 9 %
Lymphs Abs: 1 10*3/uL (ref 0.7–4.0)
MCH: 32.7 pg (ref 26.0–34.0)
MCHC: 35 g/dL (ref 30.0–36.0)
MCV: 93.2 fL (ref 78.0–100.0)
MONO ABS: 0.8 10*3/uL (ref 0.1–1.0)
MONOS PCT: 7 %
NEUTROS ABS: 9.5 10*3/uL — AB (ref 1.7–7.7)
NEUTROS PCT: 84 %
Platelets: 127 10*3/uL — ABNORMAL LOW (ref 150–400)
RBC: 2.94 MIL/uL — ABNORMAL LOW (ref 4.22–5.81)
RDW: 14.4 % (ref 11.5–15.5)
WBC: 11.3 10*3/uL — ABNORMAL HIGH (ref 4.0–10.5)

## 2017-04-19 LAB — MRSA PCR SCREENING: MRSA by PCR: NEGATIVE

## 2017-04-19 LAB — I-STAT CHEM 8, ED
BUN: 50 mg/dL — AB (ref 6–20)
CREATININE: 12.7 mg/dL — AB (ref 0.61–1.24)
Calcium, Ion: 1.14 mmol/L — ABNORMAL LOW (ref 1.15–1.40)
Chloride: 110 mmol/L (ref 101–111)
GLUCOSE: 212 mg/dL — AB (ref 65–99)
HCT: 29 % — ABNORMAL LOW (ref 39.0–52.0)
HEMOGLOBIN: 9.9 g/dL — AB (ref 13.0–17.0)
POTASSIUM: 3.3 mmol/L — AB (ref 3.5–5.1)
Sodium: 136 mmol/L (ref 135–145)
TCO2: 13 mmol/L — ABNORMAL LOW (ref 22–32)

## 2017-04-19 LAB — LIPASE, BLOOD: Lipase: 45 U/L (ref 11–51)

## 2017-04-19 LAB — CBC
HCT: 22.5 % — ABNORMAL LOW (ref 39.0–52.0)
Hemoglobin: 7.5 g/dL — ABNORMAL LOW (ref 13.0–17.0)
MCH: 31.3 pg (ref 26.0–34.0)
MCHC: 33.3 g/dL (ref 30.0–36.0)
MCV: 93.8 fL (ref 78.0–100.0)
PLATELETS: 97 10*3/uL — AB (ref 150–400)
RBC: 2.4 MIL/uL — AB (ref 4.22–5.81)
RDW: 14.6 % (ref 11.5–15.5)
WBC: 10.9 10*3/uL — ABNORMAL HIGH (ref 4.0–10.5)

## 2017-04-19 LAB — POCT I-STAT 3, ART BLOOD GAS (G3+)
ACID-BASE DEFICIT: 15 mmol/L — AB (ref 0.0–2.0)
BICARBONATE: 10.8 mmol/L — AB (ref 20.0–28.0)
O2 SAT: 98 %
PH ART: 7.257 — AB (ref 7.350–7.450)
PO2 ART: 123 mmHg — AB (ref 83.0–108.0)
TCO2: 12 mmol/L — AB (ref 22–32)
pCO2 arterial: 24.2 mmHg — ABNORMAL LOW (ref 32.0–48.0)

## 2017-04-19 LAB — GLUCOSE, CAPILLARY
Glucose-Capillary: 155 mg/dL — ABNORMAL HIGH (ref 65–99)
Glucose-Capillary: 172 mg/dL — ABNORMAL HIGH (ref 65–99)

## 2017-04-19 LAB — COMPREHENSIVE METABOLIC PANEL
ALBUMIN: 2.5 g/dL — AB (ref 3.5–5.0)
ALT: 92 U/L — ABNORMAL HIGH (ref 17–63)
ANION GAP: 15 (ref 5–15)
AST: 179 U/L — ABNORMAL HIGH (ref 15–41)
Alkaline Phosphatase: 99 U/L (ref 38–126)
BUN: 54 mg/dL — ABNORMAL HIGH (ref 6–20)
CALCIUM: 8.9 mg/dL (ref 8.9–10.3)
CO2: 12 mmol/L — AB (ref 22–32)
Chloride: 105 mmol/L (ref 101–111)
Creatinine, Ser: 12.08 mg/dL — ABNORMAL HIGH (ref 0.61–1.24)
GFR calc non Af Amer: 4 mL/min — ABNORMAL LOW (ref >60)
GFR, EST AFRICAN AMERICAN: 5 mL/min — AB (ref >60)
GLUCOSE: 211 mg/dL — AB (ref 65–99)
POTASSIUM: 3.2 mmol/L — AB (ref 3.5–5.1)
SODIUM: 132 mmol/L — AB (ref 135–145)
Total Bilirubin: 1.3 mg/dL — ABNORMAL HIGH (ref 0.3–1.2)
Total Protein: 8 g/dL (ref 6.5–8.1)

## 2017-04-19 LAB — I-STAT CG4 LACTIC ACID, ED: LACTIC ACID, VENOUS: 1.59 mmol/L (ref 0.5–1.9)

## 2017-04-19 LAB — CBG MONITORING, ED: Glucose-Capillary: 171 mg/dL — ABNORMAL HIGH (ref 65–99)

## 2017-04-19 LAB — CG4 I-STAT (LACTIC ACID): Lactic Acid, Venous: 0.9 mmol/L (ref 0.5–1.9)

## 2017-04-19 LAB — TYPE AND SCREEN
ABO/RH(D): O POS
Antibody Screen: NEGATIVE

## 2017-04-19 LAB — LACTIC ACID, PLASMA
LACTIC ACID, VENOUS: 1 mmol/L (ref 0.5–1.9)
LACTIC ACID, VENOUS: 0.8 mmol/L (ref 0.5–1.9)

## 2017-04-19 LAB — PROTIME-INR
INR: 1.03
PROTHROMBIN TIME: 13.4 s (ref 11.4–15.2)

## 2017-04-19 LAB — BRAIN NATRIURETIC PEPTIDE: B Natriuretic Peptide: 46.5 pg/mL (ref 0.0–100.0)

## 2017-04-19 LAB — PROCALCITONIN: Procalcitonin: >150 ng/mL

## 2017-04-19 LAB — CORTISOL: Cortisol, Plasma: 39.4 ug/dL

## 2017-04-19 LAB — ABO/RH: ABO/RH(D): O POS

## 2017-04-19 MED ORDER — ACETAMINOPHEN 500 MG PO TABS
1000.0000 mg | ORAL_TABLET | Freq: Once | ORAL | Status: AC
  Administered 2017-04-19: 1000 mg via ORAL
  Filled 2017-04-19: qty 2

## 2017-04-19 MED ORDER — VANCOMYCIN HCL IN DEXTROSE 1-5 GM/200ML-% IV SOLN: 1000.0000 mg | Freq: Once | INTRAVENOUS | Status: DC

## 2017-04-19 MED ORDER — ORAL CARE MOUTH RINSE
15.0000 mL | Freq: Two times a day (BID) | OROMUCOSAL | Status: DC
  Administered 2017-04-20 (×2): 15 mL via OROMUCOSAL

## 2017-04-19 MED ORDER — CHLORHEXIDINE GLUCONATE 0.12 % MT SOLN
15.0000 mL | Freq: Two times a day (BID) | OROMUCOSAL | Status: DC
  Administered 2017-04-20 (×2): 15 mL via OROMUCOSAL

## 2017-04-19 MED ORDER — PENTAFLUOROPROP-TETRAFLUOROETH EX AERO
1.0000 | INHALATION_SPRAY | CUTANEOUS | Status: DC | PRN
Start: 2017-04-19 — End: 2017-04-24

## 2017-04-19 MED ORDER — HEPARIN SODIUM (PORCINE) 1000 UNIT/ML DIALYSIS
1000.0000 [IU] | INTRAMUSCULAR | Status: DC | PRN
Start: 2017-04-19 — End: 2017-04-24

## 2017-04-19 MED ORDER — ALBUTEROL SULFATE (2.5 MG/3ML) 0.083% IN NEBU: 2.5000 mg | INHALATION_SOLUTION | RESPIRATORY_TRACT | Status: DC | PRN

## 2017-04-19 MED ORDER — ONDANSETRON HCL 4 MG/2ML IJ SOLN
4.0000 mg | Freq: Once | INTRAMUSCULAR | Status: AC
  Administered 2017-04-19: 4 mg via INTRAVENOUS
  Filled 2017-04-19: qty 2

## 2017-04-19 MED ORDER — SODIUM CHLORIDE 0.9 % IV SOLN: 100.0000 mL | INTRAVENOUS | Status: DC | PRN

## 2017-04-19 MED ORDER — SODIUM CHLORIDE 0.9 % IV BOLUS (SEPSIS)
1000.0000 mL | Freq: Once | INTRAVENOUS | Status: AC
  Administered 2017-04-19: 1000 mL via INTRAVENOUS

## 2017-04-19 MED ORDER — DARBEPOETIN ALFA 40 MCG/0.4ML IJ SOSY
40.0000 ug | PREFILLED_SYRINGE | INTRAMUSCULAR | Status: DC
  Administered 2017-04-22: 40 ug via INTRAVENOUS
  Filled 2017-04-19: qty 0.4

## 2017-04-19 MED ORDER — SODIUM CHLORIDE 0.9 % IV SOLN
250.0000 mL | INTRAVENOUS | Status: DC | PRN
  Administered 2017-04-24 (×2): via INTRAVENOUS

## 2017-04-19 MED ORDER — HEPARIN SODIUM (PORCINE) 1000 UNIT/ML DIALYSIS
20.0000 [IU]/kg | INTRAMUSCULAR | Status: DC | PRN
  Administered 2017-04-20: 1900 [IU] via INTRAVENOUS_CENTRAL

## 2017-04-19 MED ORDER — MORPHINE SULFATE (PF) 4 MG/ML IV SOLN
4.0000 mg | Freq: Once | INTRAVENOUS | Status: AC
  Administered 2017-04-19: 4 mg via INTRAVENOUS
  Filled 2017-04-19: qty 1

## 2017-04-19 MED ORDER — HYDROCORTISONE NA SUCCINATE PF 100 MG IJ SOLR
50.0000 mg | Freq: Four times a day (QID) | INTRAMUSCULAR | Status: DC
  Administered 2017-04-19 – 2017-04-21 (×6): 50 mg via INTRAVENOUS
  Filled 2017-04-19: qty 2
  Filled 2017-04-19 (×8): qty 1

## 2017-04-19 MED ORDER — POTASSIUM CHLORIDE IN NACL 20-0.9 MEQ/L-% IV SOLN
Freq: Once | INTRAVENOUS | Status: AC
  Administered 2017-04-19: 16:00:00 via INTRAVENOUS
  Filled 2017-04-19: qty 1000

## 2017-04-19 MED ORDER — LIDOCAINE-PRILOCAINE 2.5-2.5 % EX CREA
1.0000 "application " | TOPICAL_CREAM | CUTANEOUS | Status: DC | PRN
  Filled 2017-04-19: qty 5

## 2017-04-19 MED ORDER — POTASSIUM CHLORIDE 10 MEQ/100ML IV SOLN: 10.0000 meq | Freq: Once | INTRAVENOUS | Status: DC

## 2017-04-19 MED ORDER — PANTOPRAZOLE SODIUM 40 MG IV SOLR
40.0000 mg | INTRAVENOUS | Status: DC
  Administered 2017-04-19 – 2017-04-20 (×2): 40 mg via INTRAVENOUS
  Filled 2017-04-19 (×3): qty 40

## 2017-04-19 MED ORDER — LIDOCAINE HCL (PF) 1 % IJ SOLN
5.0000 mL | INTRAMUSCULAR | Status: DC | PRN
Start: 2017-04-19 — End: 2017-04-24

## 2017-04-19 MED ORDER — INSULIN ASPART 100 UNIT/ML ~~LOC~~ SOLN
2.0000 [IU] | SUBCUTANEOUS | Status: DC
  Administered 2017-04-19 – 2017-04-20 (×3): 4 [IU] via SUBCUTANEOUS
  Administered 2017-04-20: 2 [IU] via SUBCUTANEOUS
  Administered 2017-04-20 (×2): 4 [IU] via SUBCUTANEOUS
  Administered 2017-04-20: 6 [IU] via SUBCUTANEOUS

## 2017-04-19 MED ORDER — PIPERACILLIN-TAZOBACTAM IN DEX 2-0.25 GM/50ML IV SOLN
2.2500 g | Freq: Three times a day (TID) | INTRAVENOUS | Status: DC
  Administered 2017-04-20 (×2): 2.25 g via INTRAVENOUS
  Filled 2017-04-19 (×4): qty 50

## 2017-04-19 MED ORDER — HEPARIN SODIUM (PORCINE) 1000 UNIT/ML IJ SOLN
2800.0000 [IU] | Freq: Once | INTRAMUSCULAR | Status: AC
  Administered 2017-04-19: 2800 [IU] via INTRAVENOUS
  Filled 2017-04-19: qty 3
  Filled 2017-04-19: qty 2.8

## 2017-04-19 MED ORDER — ONDANSETRON HCL 4 MG/2ML IJ SOLN
4.0000 mg | Freq: Four times a day (QID) | INTRAMUSCULAR | Status: DC | PRN
  Administered 2017-04-25: 4 mg via INTRAVENOUS
  Filled 2017-04-19: qty 2

## 2017-04-19 MED ORDER — SODIUM CHLORIDE 0.9 % IV SOLN
2000.0000 mg | Freq: Once | INTRAVENOUS | Status: AC
  Administered 2017-04-19: 2000 mg via INTRAVENOUS
  Filled 2017-04-19: qty 2000

## 2017-04-19 MED ORDER — ALTEPLASE 2 MG IJ SOLR
2.0000 mg | Freq: Once | INTRAMUSCULAR | Status: DC | PRN
  Filled 2017-04-19: qty 2

## 2017-04-19 MED ORDER — SODIUM BICARBONATE 8.4 % IV SOLN
INTRAVENOUS | Status: DC
  Administered 2017-04-19: 20:00:00 via INTRAVENOUS
  Filled 2017-04-19 (×3): qty 150

## 2017-04-19 MED ORDER — PIPERACILLIN-TAZOBACTAM 3.375 G IVPB 30 MIN
3.3750 g | Freq: Once | INTRAVENOUS | Status: AC
  Administered 2017-04-19: 3.375 g via INTRAVENOUS
  Filled 2017-04-19: qty 50

## 2017-04-19 MED ORDER — HEPARIN SODIUM (PORCINE) 5000 UNIT/ML IJ SOLN
5000.0000 [IU] | Freq: Three times a day (TID) | INTRAMUSCULAR | Status: DC
  Administered 2017-04-19 – 2017-04-25 (×12): 5000 [IU] via SUBCUTANEOUS
  Filled 2017-04-19 (×14): qty 1

## 2017-04-19 NOTE — Progress Notes (Addendum)
Pt c/o nausea, states he feels like he's going to throw up. Order for BIPAP placed. Due to nausea/vomiting, pt not placed on BIPAP at this time. Son also feels that pt will not do well with BIPAP. Pt currently on 4L HFNC to assist with increased WOB. RT will continue to monitor.

## 2017-04-19 NOTE — Procedures (Signed)
Central Venous Hemodialysis Catheter Insertion Procedure Note James [name] [facility] [id number] [birth year]  Procedure: Insertion of Central Venous Catheter Indications: Drug and/or fluid administration, Frequent blood sampling and hemodialysis  Procedure Details Consent: Risks of procedure as well as the alternatives and risks of each were explained to the (patient/caregiver).  Consent for procedure obtained.  Time Out: Verified patient identification, verified procedure, site/side was marked, verified correct patient position, special equipment/implants available, medications/allergies/relevent history reviewed, required imaging and test results available.  Performed  Maximum sterile technique was used including antiseptics, cap, gloves, gown, hand hygiene, mask and sheet. Skin prep: Chlorhexidine; local anesthetic administered A antimicrobial bonded/coated triple lumen trialysis catheter was placed in the left internal jugular vein using the Seldinger technique.  Sutured at 20 cm.  Biopatch placed and sterile dressing applied.   Evaluation Blood flow good Complications: No apparent complications Patient did tolerate procedure well. Chest X-ray ordered to verify placement.  CXR: pending.  Procedure performed with ultrasound guidance for real time vessel cannulation.     James [name], AGACNP-BC [facility] Pgr: [phone] or if no answer [phone] 04/19/2017, 5:38 PM

## 2017-04-19 NOTE — ED Provider Notes (Signed)
[facility] DEPT Provider Note   CSN: [id number] Arrival date & time: 04/19/17  1404     History   Chief Complaint Chief Complaint  Patient presents with  . Chest Pain    HPI James [name] is a 46 y.o. male.  HPI Patient arrives via EMS acutely ill and in distress. Patient reports that he has been getting sick for about 4 days. At this time, he identifies it as whole body aches and hurts all over. He does endorse that symptoms started with chest pain and shortness of breath. He also endorses some abdominal pain. He is not endorsing copious vomiting or diarrhea. Patient is found to be febrile by EMS at 102 bitemporal scan. On route blood pressures have been stable. Patient has been tachycardic and tachypnea. Patient has history of renal transplant and is on immunosuppressants. Patient reports however that the transplant is failing and although he is not on dialysis yet, "things are heading that way". He reports he still makes urine sometimes.   Past Medical History:  Diagnosis Date  . Diabetes mellitus without complication ([place])   . Hypertension   . Renal disorder   . 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 He then got HD in [facility] until getting a renal transplant in 2009 at [place], [place].  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    . Smoker   . Transplant recipient     Patient Active Problem List   Diagnosis Date Noted  . Sepsis due to Gram-negative organism with acute renal failure ([place]) 08/14/2015  . Acute on chronic renal failure ([place])   . Nausea vomiting and diarrhea   . Renal transplant disorder   . Diarrhea 08/13/2015  . AKI (acute kidney injury) ([place]) 08/13/2015  . Urinary tract infection 04/10/2014  .  UTI (urinary tract infection) 04/09/2014  . Tachycardia 04/09/2014  . Renal transplant recipient / ESRD   . Diabetes mellitus without complication ([place])   . Hypertension   . Smoker     Past Surgical History:  Procedure Laterality Date  . HIP SURGERY    . KIDNEY TRANSPLANT         Home Medications    Prior to Admission medications   Medication Sig Start Date End Date Taking? Authorizing Provider  amLODipine (NORVASC) 5 MG tablet Take 1 tablet (5 mg total) by mouth daily. 08/16/15   Liberty Handy, MD  ciprofloxacin (CIPRO) 500 MG tablet Take 1 tablet (500 mg total) by mouth once. 08/16/15   Liberty Handy, MD  Insulin Glargine (LANTUS SOLOSTAR) 100 UNIT/ML Solostar Pen Inject 40 Units into the skin daily at 10 pm.    [provider]  insulin lispro (HUMALOG) 100 UNIT/ML injection Inject 15 Units into the skin 3 (three) times daily before meals.    [provider]  metoprolol tartrate (LOPRESSOR) 25 MG tablet Take 75 mg by mouth 2 (two) times daily.    [provider]  Multiple Vitamins-Minerals (MULTIVITAMIN WITH MINERALS) tablet Take 1 tablet by mouth every morning.    [provider]  predniSONE (DELTASONE) 5 MG tablet Take 5 mg by mouth daily with breakfast.    [provider]  sodium bicarbonate 650 MG tablet Take 1,300 mg by mouth 3 (three) times daily.    [provider]  tacrolimus (PROGRAF) 1 MG capsule Take 1-2 mg by mouth 2 (two) times daily. Take 2mg  in the morning and 1mg  in the evening.    [provider]  tamsulosin (FLOMAX) 0.4 MG CAPS capsule Take 0.4 mg by mouth every evening.    [provider]  valACYclovir (VALTREX) 500 MG tablet Take 500 mg by mouth every morning.    [provider]  zolpidem (AMBIEN) 5 MG tablet Take 5 mg by mouth at bedtime as needed for sleep.     [provider]    Family History Family History  Problem Relation Age of Onset  . Adopted: Yes    Social  History Social History  Substance Use Topics  . Smoking status: Current Every Day Smoker    Packs/day: 0.50  . Smokeless tobacco: Not on file  . Alcohol use No     Allergies   Darvon [propoxyphene] and Tramadol   Review of Systems Review of Systems 10 Systems reviewed and are negative for acute change except as noted in the HPI.  Physical Exam Updated Vital Signs BP 127/85   Pulse (!) 140   Temp 99.7 F (37.6 C) (Oral)   Resp (!) 44   Ht 5\' 6"  (1.676 m)   Wt 93.7 kg (206 lb 9.1 oz)   SpO2 97%   BMI 33.34 kg/m   Physical Exam  Constitutional:  Patient arrives via EMS stretcher. He is toxic in appearance. Patient has tachypnea. He is answering questions that seems mildly confused. Skin is hot and dry.  HENT:  Head: Normocephalic and atraumatic.  Mucus memories are very dry. Nose has some crusted secretions.  Eyes: EOM are normal.  Neck: Neck supple.  Cardiovascular:  Tachycardia. Distal pulses intact.  Pulmonary/Chest:  Patient has tachypnea. Bilateral air flow. Diminished at bases.  Abdominal: Soft.  Patient endorses diffuse tenderness in the abdomen. Abdomen is soft without guarding.  Neurological:  Patient is awake but fatigued in appearance and mildly confused. No focal neurologic deficits. He is assisting to follow commands but very fatigued from illness.  Skin:  Skin is hot and dry. No obvious rash.     ED Treatments / Results  Labs (all labs ordered are listed, but only abnormal results are displayed) Labs Reviewed  COMPREHENSIVE METABOLIC PANEL - Abnormal; Notable for the following:       Result Value   Sodium 132 (*)    Potassium 3.2 (*)    CO2 12 (*)    Glucose, Bld 211 (*)    BUN 54 (*)    Creatinine, Ser 12.08 (*)    Albumin 2.5 (*)    AST 179 (*)    ALT 92 (*)    Total Bilirubin 1.3 (*)    GFR calc non Af Amer 4 (*)    GFR calc Af Amer 5 (*)    All other components within normal limits  CBC WITH DIFFERENTIAL/PLATELET - Abnormal;  Notable for the following:    WBC 11.3 (*)    RBC 2.94 (*)    Hemoglobin 9.6 (*)    HCT 27.4 (*)    Platelets 127 (*)    Neutro Abs 9.5 (*)    All other components within normal limits  I-STAT TROPONIN, ED - Abnormal; Notable for the following:    Troponin i, poc 0.19 (*)    All  other components within normal limits  I-STAT VENOUS BLOOD GAS, ED - Abnormal; Notable for the following:    pCO2, Ven 23.0 (*)    pO2, Ven 29.0 (*)    Bicarbonate 11.1 (*)    TCO2 12 (*)    Acid-base deficit 14.0 (*)    All other components within normal limits  I-STAT CHEM 8, ED - Abnormal; Notable for the following:    Potassium 3.3 (*)    BUN 50 (*)    Creatinine, Ser 12.70 (*)    Glucose, Bld 212 (*)    Calcium, Ion 1.14 (*)    TCO2 13 (*)    Hemoglobin 9.9 (*)    HCT 29.0 (*)    All other components within normal limits  CULTURE, BLOOD (ROUTINE X 2)  CULTURE, BLOOD (ROUTINE X 2)  URINE CULTURE  LIPASE, BLOOD  BRAIN NATRIURETIC PEPTIDE  PROTIME-INR  URINALYSIS, ROUTINE W REFLEX MICROSCOPIC  I-STAT CG4 LACTIC ACID, ED  TYPE AND SCREEN    EKG  EKG Interpretation  Date/Time:  Friday April 19 2017 14:05:31 EDT Ventricular Rate:  147 PR Interval:    QRS Duration: 92 QT Interval:  296 QTC Calculation: 463 R Axis:   66 Text Interpretation:  Sinus tachycardia Consider left ventricular hypertrophy Minimal ST elevation, inferior leads Confirmed by Charlesetta Shanks [phone]) on 04/19/2017 3:08:17 PM       Radiology Dg Chest Port 1 View  Result Date: 04/19/2017 CLINICAL DATA:  Hypertension.  Sepsis. EXAM: PORTABLE CHEST 1 VIEW COMPARISON:  August 14, 2015 FINDINGS: There is extensive airspace consolidation in the right upper lobe. Lungs elsewhere clear. Heart is upper normal in size with pulmonary vascularity within normal limits. No adenopathy. No bone lesions. IMPRESSION: Extensive airspace opacity throughout much of the right upper lobe, most likely due to pneumonia. Lungs elsewhere  clear. Heart upper normal in size. Followup PA and lateral chest radiographs recommended in 3-4 weeks following trial of antibiotic therapy to ensure resolution and exclude underlying malignancy. Electronically Signed   By: Lowella Grip III M.D.   On: 04/19/2017 14:21    Procedures Procedures (including critical care time) CRITICAL CARE Performed by: Charlesetta Shanks   Total critical care time: 30 minutes  Critical care time was exclusive of separately billable procedures and treating other patients.  Critical care was necessary to treat or prevent imminent or life-threatening deterioration.  Critical care was time spent personally by me on the following activities: development of treatment plan with patient and/or surrogate as well as nursing, discussions with consultants, evaluation of patient's response to treatment, examination of patient, obtaining history from patient or surrogate, ordering and performing treatments and interventions, ordering and review of laboratory studies, ordering and review of radiographic studies, pulse oximetry and re-evaluation of patient's condition. Medications Ordered in ED Medications  vancomycin (VANCOCIN) 2,000 mg in sodium chloride 0.9 % 500 mL IVPB (2,000 mg Intravenous New Bag/Given 04/19/17 1442)  piperacillin-tazobactam (ZOSYN) IVPB 2.25 g (not administered)  morphine 4 MG/ML injection 4 mg (not administered)  ondansetron (ZOFRAN) injection 4 mg (not administered)  sodium chloride 0.9 % bolus 1,000 mL (not administered)  0.9 % NaCl with KCl 20 mEq/ L  infusion (not administered)  potassium chloride 10 mEq in 100 mL IVPB (not administered)  sodium chloride 0.9 % bolus 1,000 mL (1,000 mLs Intravenous New Bag/Given 04/19/17 1404)    And  sodium chloride 0.9 % bolus 1,000 mL (1,000 mLs Intravenous New Bag/Given 04/19/17 1437)  piperacillin-tazobactam (ZOSYN) IVPB 3.375 g (  3.375 g Intravenous New Bag/Given 04/19/17 1432)     Initial Impression /  Assessment and Plan / ED Course  I have reviewed the triage vital signs and the nursing notes.  Pertinent labs & imaging results that were available during my care of the patient were reviewed by me and considered in my medical decision making (see chart for details).    @15 :76 family members, fiance and son arrived. They reported they wanted the patient immediately transferred to [facility] as that is where his primary providers are. At this time, Dr. Lamonte Sakai the intensivist has come down to the emergency department. Will continue with assessment to determine if patient is adequately stable for transfer. In the meantime will contact [facility] to arrange transfer when the patient is adequately stabilized.  Final Clinical Impressions(s) / ED Diagnoses   Final diagnoses:  Community acquired pneumonia of right upper lobe of lung ([place])  Sepsis, due to unspecified organism ([place])  Acute renal failure superimposed on chronic kidney disease, unspecified CKD stage, unspecified acute renal failure type [facility])  Patient arrived with septic appearance by EMS. Sepsis protocol immediately initiated with antibiotics chosen for uncertain etiology. Chest x-ray resulted showing right upper lobe infiltrate. Patient is immunosuppressed with history of failing kidney transplant. All treatment had been initiated including consultation to intensivist service who was actually in the emergency department initiating evaluation of the patient when a second pair of family members arrived (earlier two individuals had been present who were updated on patient's condition and plan). They were quite agitated upon arrival and immediately demanded transfer to [facility] which the patient had never mentioned or requested (although certainly ill, patient was aware of his surroundings and plan with capacity to request transfer or alternate care if he so desired). I did make consultation to the covering intensivist at [facility] while  Dr. Lamonte Sakai was also doing his evaluation. There were no available beds at the time and patient also needed stabilization prior to transfer. Dr. Lamonte Sakai will continue to manage the patient and determine when patient is stable for transport if/when [facility] has bed availability for the patient.  New Prescriptions New Prescriptions   No medications on file     Charlesetta Shanks, MD 04/28/17 [phone]

## 2017-04-19 NOTE — ED Triage Notes (Signed)
Patient brought in by [facility] from home with altered mental status and chest tightness.  Patient is lethargic but oriented.  History of kidney transplant last year, states that he has been talking with his doctor about possibly needing dialysis again.  Patient arrives tachypnic and tachycardic with heart rate in 140's.  MD at bedside.

## 2017-04-19 NOTE — ED Notes (Signed)
Dr. Johnney Killian not is office troponin result of 0.19 given to RN Carlis Abbott

## 2017-04-19 NOTE — Progress Notes (Signed)
Pharmacy Antibiotic Note  James [name] is a 46 y.o. male admitted on 04/19/2017 with sepsis.  Pharmacy has been consulted for vancomycin and zosyn dosing. Pt is afebrile and lactic acid is <2. WBC still pending and Scr is significantly elevated at 12.7. He is not currently on HD.   Plan: Zosyn 3.375gm IV x 1 then 2.25gm IV Q8H Vanc 2gm IV x 1 - f/u renal plans for further doses F/u renal fxn, C&S, clinical status and trough at SS  Height: 5\' 6"  (167.6 cm) Weight: 206 lb 9.1 oz (93.7 kg) IBW/kg (Calculated) : 63.8  Temp (24hrs), Avg:99.7 F (37.6 C), Min:99.7 F (37.6 C), Max:99.7 F (37.6 C)   Recent Labs Lab 04/19/17 1415 04/19/17 1416  CREATININE 12.70*  --   LATICACIDVEN  --  1.59    Estimated Creatinine Clearance: 8 mL/min (A) (by C-G formula based on SCr of 12.7 mg/dL (H)).    Allergies  Allergen Reactions  . Darvon [Propoxyphene]     Broke out on eyelid   . Tramadol     Rash    Antimicrobials this admission: Vanc 9/28>> Zosyn 9/28>>  Dose adjustments this admission: N/A  Microbiology results: Pending  Thank you for allowing pharmacy to be a part of this patient's care.  Brielyn Bosak, Rande Lawman 04/19/2017 2:25 PM

## 2017-04-19 NOTE — H&P (Addendum)
PULMONARY / CRITICAL CARE MEDICINE   Name: James [name] MRN: [id number] DOB: 01/18/1971    ADMISSION DATE:  04/19/2017 CONSULTATION DATE:  04/19/17  REFERRING MD:  Dr Johnney Killian, [facility]  CHIEF COMPLAINT:  Respiratory distress, encephalopathy  HISTORY OF PRESENT ILLNESS:   46 year old man with history of hypertension, diabetes, renal failure status post renal transplant 03/2007. His transplant was complicated by EBV-associated lymphoproliferative disorder B-cell lymphoma, requiring cessation of his immunosuppression regimen and ultimately failure of his transplant. He has been under evaluation by his primary nephrologist Dr. Joesph July for re-initiation of HD. Proximally 5 days ago he developed nausea and was unable to take his oral medications. For the last 2 days he has been febrile. On the day of admission he began to exhibit confusion and tachypnea with dyspnea. He also complained of some right-sided chest discomfort. He was brought to the emergency Department for evaluation on 9/28 and was found to have acute renal failure with an anion gap metabolic acidosis, respiratory distress in the setting of a right upper lobe pneumonia on chest x-ray. He will be admitted to the ICU with severe sepsis, acute on chronic renal failure, right upper lobe pneumonia and evolving respiratory failure.  PAST MEDICAL HISTORY :  He  has a past medical history of Diabetes mellitus without complication ([place]); Hypertension; Renal disorder; Renal transplant recipient / ESRD; Smoker; and Transplant recipient.  PAST SURGICAL HISTORY: He  has a past surgical history that includes Hip surgery and Kidney transplant.  Allergies  Allergen Reactions  . Darvon [Propoxyphene] Rash    Broke out on eyelid   . Tramadol Rash    No current facility-administered medications on file prior to encounter.    Current Outpatient Prescriptions on File Prior to Encounter  Medication Sig  . amLODipine (NORVASC) 5 MG tablet Take 1 tablet  (5 mg total) by mouth daily. (Patient taking differently: Take 10 mg by mouth daily. )  . metoprolol tartrate (LOPRESSOR) 25 MG tablet Take 25 mg by mouth 2 (two) times daily.   . Multiple Vitamins-Minerals (MULTIVITAMIN WITH MINERALS) tablet Take 1 tablet by mouth daily.   . predniSONE (DELTASONE) 5 MG tablet Take 5 mg by mouth daily with breakfast.  . sodium bicarbonate 650 MG tablet Take 1,300 mg by mouth 3 (three) times daily.  . tacrolimus (PROGRAF) 0.5 MG capsule Take 0.5-1 mg by mouth See admin instructions. Take 2 capsules (1 mg) by mouth every morning and 1 capsule (0.5 mg) every evening  . tamsulosin (FLOMAX) 0.4 MG CAPS capsule Take 0.4 mg by mouth daily.     FAMILY HISTORY:  His is adopted.    SOCIAL HISTORY: He  reports that he has been smoking.  He has been smoking about 0.50 packs per day. He does not have any smokeless tobacco history on file. He reports that he does not drink alcohol or use drugs.  REVIEW OF SYSTEMS:   As detailed above in the history of present illness  SUBJECTIVE:  Complains of "pain all over"  VITAL SIGNS: BP 133/76   Pulse (!) 125   Temp (!) 104 F (40 C) (Oral)   Resp (!) 35   Ht 5\' 6"  (1.676 m)   Wt 93.7 kg (206 lb 9.1 oz)   SpO2 100%   BMI 33.34 kg/m   HEMODYNAMICS:    VENTILATOR SETTINGS:    INTAKE / OUTPUT: No intake/output data recorded.  PHYSICAL EXAMINATION: General:  Ill-appearing obese man lying on ER bed Neuro:  Awake and alert,  confused but redirectable, unable to answer all questions, oriented to self and occasionally situation. Able to move all extremities. No obvious focal deficits HEENT:  Pupils equal, oropharynx somewhat dry, large neck Cardiovascular:  Tachycardic, regular, no murmur, no edema Lungs:  Distant but clear, no crackles or wheezes heard Abdomen:  Obese, soft, nontender, positive bowel sounds Musculoskeletal:  No deformities Skin:  No apparent rash or warmth  LABS:  BMET  Recent Labs Lab  04/19/17 1401 04/19/17 1415  NA 132* 136  K 3.2* 3.3*  CL 105 110  CO2 12*  --   BUN 54* 50*  CREATININE 12.08* 12.70*  GLUCOSE 211* 212*    Electrolytes  Recent Labs Lab 04/19/17 1401  CALCIUM 8.9    CBC  Recent Labs Lab 04/19/17 1401 04/19/17 1415  WBC 11.3*  --   HGB 9.6* 9.9*  HCT 27.4* 29.0*  PLT 127*  --     Coag's  Recent Labs Lab 04/19/17 1401  INR 1.03    Sepsis Markers  Recent Labs Lab 04/19/17 1416  LATICACIDVEN 1.59    ABG No results for input(s): PHART, PCO2ART, PO2ART in the last 168 hours.  Liver Enzymes  Recent Labs Lab 04/19/17 1401  AST 179*  ALT 92*  ALKPHOS 99  BILITOT 1.3*  ALBUMIN 2.5*    Cardiac Enzymes No results for input(s): TROPONINI, PROBNP in the last 168 hours.  Glucose No results for input(s): GLUCAP in the last 168 hours.  Imaging Dg Chest Port 1 View  Result Date: 04/19/2017 CLINICAL DATA:  Hypertension.  Sepsis. EXAM: PORTABLE CHEST 1 VIEW COMPARISON:  August 14, 2015 FINDINGS: There is extensive airspace consolidation in the right upper lobe. Lungs elsewhere clear. Heart is upper normal in size with pulmonary vascularity within normal limits. No adenopathy. No bone lesions. IMPRESSION: Extensive airspace opacity throughout much of the right upper lobe, most likely due to pneumonia. Lungs elsewhere clear. Heart upper normal in size. Followup PA and lateral chest radiographs recommended in 3-4 weeks following trial of antibiotic therapy to ensure resolution and exclude underlying malignancy. Electronically Signed   By: Lowella Grip III M.D.   On: 04/19/2017 14:21     STUDIES:   CULTURES: Blood culture/28 >>  Urine culture 9/28 >>   ANTIBIOTICS: vanco 9/28 >>  Zosyn 9/28 >>   SIGNIFICANT EVENTS:  LINES/TUBES: HD catheter 9/28 >>   DISCUSSION: 46 year old man, history ESRD and failing renal transplant. Also history PTLD B-cell lymphoma. Admitted with right upper lobe healthcare associated  pneumonia, acute on chronic renal failure, metabolic acidosis, encephalopathy.   ASSESSMENT / PLAN:  PULMONARY A: Right upper lobe healthcare associated pneumonia Acute respiratory failure History of tobacco use P:   Adequate compensation currently but at high risk for intubation for mechanical ventilation, follow resp status closely Follow ABG and chest x-ray Albuterol as needed Antibiotics as below  CARDIOVASCULAR A:  Sinus tachycardia History hypertension, currently normotensive P:  Hold all home blood pressure medications for now  RENAL A:   Acute on chronic renal failure History of renal transplant, PTLD, failed Anion gap metabolic acidosis in the setting of uremia, severe sepsis P:   Hold diuretics Start low dose bicarb gtt to temporize until we can initiate HD Appreciate nephrology help   GASTROINTESTINAL A:   SUP Nutrition Transaminitis P:   NPO PPI for prophylaxis We will check a right upper quad ultrasound  HEMATOLOGIC A:   History of PTLD, B-cell lymphoma treated rituximab P:  Follow CBC Immunosuppression held  as below  INFECTIOUS A:   Severe sepsis Right upper lobe healthcare associated pneumonia Hx EBV History aspergillus pneumonia 2011 P:   Broad spectrum antibiotics, vancomycin and Zosyn pending culture data Follow cultures to completion Hold tacrolimus, immunosuppression. Stress dose steroids ordered Check viral panel, Consider restart vangalciclovir prophylaxis If decompensates consider CT chest, possible aspergillus. Nothing to support at this time.    ENDOCRINE A:   At risk adrenal insufficiency, chronic prednisone use Diabetes mellitus P:   Hydrocortisone stress dose ordered Hold Levemir Sliding scale insulin per ICU protocol  NEUROLOGIC A:   Toxic metabolic encephalopathy P:   RASS goal: 0 Minimize all sedating medications, treat underlying causes   FAMILY  - Updates: spoke with his fiancee and others at bedside in  the ED  - Inter-disciplinary family meet or Palliative Care meeting due by:  04/26/17   Independent CC time 57 minutes   Baltazar Apo, MD, PhD 04/19/2017, 5:23 PM [facility] [phone] or if no answer [phone]

## 2017-04-19 NOTE — Consult Note (Signed)
Reason for Consult: Progressive CKD now ESRD Referring Physician: Vineet Kinney Pecore is an 46 y.o. male.  HPI: Mr. 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  He was being set up for vascular access placement this week by [facility] transplant clinic as he was approaching ESRD, however he was brought to [facility] by EMS due to altered mental status and chest tightness.  He was noted to be febrile at 102, tachypnic and tachycardic with HR in the 140's.  We were consulted to further evaluate and manage his progressive CKD now stage 5 and requiring hemodialysis.  The trend in Scr is seen below, however his Scr has been in the 5-7 range over the last 3 months.  He had been on dialysis prior to his transplant and was kicked out of the [place] units due to noncompliance with HD which continued at the [facility] HD, however he has had his transplant for 10 years.  He is accompanied by his family and they report that his mental status has been waxing and waning for several days but was more confused this morning.   Trend in Creatinine: Creatinine, Ser  Date/Time Value Ref Range Status  04/19/2017 02:15 PM 12.70 (H) 0.61 - 1.24 mg/dL Final  04/19/2017 02:01 PM 12.08 (H) 0.61 - 1.24 mg/dL Final  08/16/2015 08:55 AM 3.13 (H) 0.61 - 1.24 mg/dL Final  08/15/2015 07:20 AM 3.20 (H) 0.61 - 1.24 mg/dL Final  08/14/2015 12:23 PM 3.54 (H) 0.61 - 1.24 mg/dL Final  08/13/2015 05:30 PM 3.80 (H) 0.61 - 1.24 mg/dL Final  08/13/2015 01:31 AM 4.13 (H) 0.61 - 1.24 mg/dL Final  04/10/2014 06:50 AM 2.17 (H) 0.50 - 1.35 mg/dL Final  04/09/2014 08:10 PM 2.21 (H) 0.50 - 1.35 mg/dL Final  08/21/2010 10:01 AM 1.36 0.4 - 1.5 mg/dL Final  03/06/2010 10:42 PM  1.11 0.4 - 1.5 mg/dL Final    PMH:   Past Medical History:  Diagnosis Date  . Diabetes mellitus without complication ([facility])   . Hypertension   . Renal disorder   . 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 He then got HD in [facility] until getting a renal transplant in 2009 at [place], [place].  He came down with PTLD around 2010 and has been followed by [facility] for all his transplant care.  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    . Smoker   . Transplant recipient     PSH:   Past Surgical History:  Procedure Laterality Date  . HIP SURGERY    . KIDNEY TRANSPLANT      Allergies:  Allergies  Allergen Reactions  . Darvon [Propoxyphene] Rash    Broke out on eyelid   . Tramadol Rash    Medications:   Prior to Admission medications   Medication Sig Start Date End Date Taking? Authorizing Provider  acetaminophen (TYLENOL) 500 MG tablet Take 500-1,000 mg by mouth every 6 (six) hours as needed for headache (pain).   Yes [provider]  amLODipine (NORVASC) 5  MG tablet Take 1 tablet (5 mg total) by mouth daily. Patient taking differently: Take 10 mg by mouth daily.  08/16/15  Yes Liberty Handy, MD  doxazosin (CARDURA) 2 MG tablet Take 1 mg by mouth at bedtime.   Yes [provider]  furosemide (LASIX) 40 MG tablet Take 40 mg by mouth daily.   Yes [provider]  insulin aspart (NOVOLOG FLEXPEN) 100 UNIT/ML FlexPen Inject 45 Units into the skin 3 (three) times daily as needed for high blood sugar (CBG >170).   Yes [provider]  Insulin Detemir (LEVEMIR FLEXTOUCH) 100 UNIT/ML Pen Inject 60 Units into the skin at bedtime.   Yes [provider]  latanoprost (XALATAN) 0.005 % ophthalmic solution Place 1 drop into both eyes at bedtime.   Yes [provider]  metoprolol tartrate  (LOPRESSOR) 25 MG tablet Take 25 mg by mouth 2 (two) times daily.    Yes [provider]  Multiple Vitamins-Minerals (MULTIVITAMIN WITH MINERALS) tablet Take 1 tablet by mouth daily.    Yes [provider]  omeprazole (PRILOSEC) 20 MG capsule Take 20 mg by mouth daily.   Yes [provider]  predniSONE (DELTASONE) 5 MG tablet Take 5 mg by mouth daily with breakfast.   Yes [provider]  sodium bicarbonate 650 MG tablet Take 1,300 mg by mouth 3 (three) times daily.   Yes [provider]  tacrolimus (PROGRAF) 0.5 MG capsule Take 0.5-1 mg by mouth See admin instructions. Take 2 capsules (1 mg) by mouth every morning and 1 capsule (0.5 mg) every evening   Yes [provider]  tamsulosin (FLOMAX) 0.4 MG CAPS capsule Take 0.4 mg by mouth daily.    Yes [provider]  valGANciclovir (VALCYTE) 450 MG tablet Take 450 mg by mouth daily.   Yes [provider]  zolpidem (AMBIEN) 10 MG tablet Take 10 mg by mouth at bedtime as needed for sleep.   Yes [provider]    Inpatient medications:   Discontinued Meds:   Medications Discontinued During This Encounter  Medication Reason  . vancomycin (VANCOCIN) IVPB 1000 mg/200 mL premix   . ciprofloxacin (CIPRO) 500 MG tablet Completed Course  . Insulin Glargine (LANTUS SOLOSTAR) 100 UNIT/ML Solostar Pen Change in therapy  . insulin lispro (HUMALOG) 100 UNIT/ML injection Change in therapy  . valACYclovir (VALTREX) 500 MG tablet Discontinued by provider  . zolpidem (AMBIEN) 5 MG tablet Dose change    Social History:  reports that he has been smoking.  He has been smoking about 0.50 packs per day. He does not have any smokeless tobacco history on file. He reports that he does not drink alcohol or use drugs.  Family History:   Family History  Problem Relation Age of Onset  . Adopted: Yes    Review of systems not obtained due to patient factors. Weight change:  No intake or  output data in the 24 hours ending 04/19/17 1712 BP 123/79   Pulse (!) 128   Temp (!) 104 F (40 C) (Oral)   Resp (!) 34   Ht 5\' 6"  (1.676 m)   Wt 93.7 kg (206 lb 9.1 oz)   SpO2 100%   BMI 33.34 kg/m  Vitals:   04/19/17 1559 04/19/17 1600 04/19/17 1611 04/19/17 1615  BP:  133/76 133/76 123/79  Pulse:  (!) 132 (!) 125 (!) 128  Resp:  (!) 48 (!) 35 (!) 34  Temp: (!) 104 F (40 C)  TempSrc: Oral     SpO2:  100% 100% 100%  Weight:      Height:         General appearance: moderate distress, toxic and delirious Head: Normocephalic, without obvious abnormality, atraumatic Neck: no adenopathy, no carotid bruit, supple, symmetrical, trachea midline and thyroid not enlarged, symmetric, no tenderness/mass/nodules Resp: rhonchi bilaterally Cardio: tachycardic, no rub GI: soft, non-tender; bowel sounds normal; no masses,  no organomegaly and renal allograft in RLQ, nontender, no bruits Extremities: extremities normal, atraumatic, no cyanosis or edema and clotted AVF in LUE Neuro:  +asterixis, delirious  Labs: Basic Metabolic Panel:  Recent Labs Lab 04/19/17 1401 04/19/17 1415  NA 132* 136  K 3.2* 3.3*  CL 105 110  CO2 12*  --   GLUCOSE 211* 212*  BUN 54* 50*  CREATININE 12.08* 12.70*  ALBUMIN 2.5*  --   CALCIUM 8.9  --    Liver Function Tests:  Recent Labs Lab 04/19/17 1401  AST 179*  ALT 92*  ALKPHOS 99  BILITOT 1.3*  PROT 8.0  ALBUMIN 2.5*    Recent Labs Lab 04/19/17 1401  LIPASE 45   No results for input(s): AMMONIA in the last 168 hours. CBC:  Recent Labs Lab 04/19/17 1401 04/19/17 1415  WBC 11.3*  --   NEUTROABS 9.5*  --   HGB 9.6* 9.9*  HCT 27.4* 29.0*  MCV 93.2  --   PLT 127*  --    PT/INR: @LABRCNTIP (inr:5) Cardiac Enzymes: )No results for input(s): CKTOTAL, CKMB, CKMBINDEX, TROPONINI in the last 168 hours. CBG: No results for input(s): GLUCAP in the last 168 hours.  Iron Studies: No results for input(s): IRON, TIBC, TRANSFERRIN,  FERRITIN in the last 168 hours.  Xrays/Other Studies: Dg Chest Port 1 View  Result Date: 04/19/2017 CLINICAL DATA:  Hypertension.  Sepsis. EXAM: PORTABLE CHEST 1 VIEW COMPARISON:  August 14, 2015 FINDINGS: There is extensive airspace consolidation in the right upper lobe. Lungs elsewhere clear. Heart is upper normal in size with pulmonary vascularity within normal limits. No adenopathy. No bone lesions. IMPRESSION: Extensive airspace opacity throughout much of the right upper lobe, most likely due to pneumonia. Lungs elsewhere clear. Heart upper normal in size. Followup PA and lateral chest radiographs recommended in 3-4 weeks following trial of antibiotic therapy to ensure resolution and exclude underlying malignancy. Electronically Signed   By: Lowella Grip III M.D.   On: 04/19/2017 14:21     Assessment/Plan: 1.  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  Pt is uremic and will initiate HD after HD catheter placed.  He will require tunneled HD cath and AVF/AVG after he is stablized. 2. RUL Pneumonia in immunocompromised patient with hypoxic respiratory distress- broad spectrum abx per PCCM and agree with holding prograf given low dose and h/o PTLD and active infection.  Agree with stress dose hydrocortisone. 3. ID- blood and urine cultures drawn.  abx per PCCM 4. Metabolic acidosis- plan for HD and agree with IV bicarb for now. 5. Abnormal LFT's- will check EBV/CMV PCR and abdominal US 6. AMS- likely metabolic encephalopathy related to uremia and infection/pneumonia with hypoxia. 7. HTN- stable 8. Vascular access- as above will need temp HD cath for now and eventual tunneled HD cath and AVF/AVG 9. Anemia of CKD stage 5 and immunosuppression therapy. Will start ESA and check iron stores.  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 Disposition- pt is critically ill and agree with ICU  admission.   Governor Rooks Scot Shiraishi 04/19/2017, 5:12 PM

## 2017-04-20 ENCOUNTER — Inpatient Hospital Stay (HOSPITAL_COMMUNITY): Payer: Medicare Other

## 2017-04-20 DIAGNOSIS — A419 Sepsis, unspecified organism: Principal | ICD-10-CM

## 2017-04-20 DIAGNOSIS — R6520 Severe sepsis without septic shock: Secondary | ICD-10-CM

## 2017-04-20 LAB — CBC
HCT: 21.6 % — ABNORMAL LOW (ref 39.0–52.0)
Hemoglobin: 7.1 g/dL — ABNORMAL LOW (ref 13.0–17.0)
MCH: 31 pg (ref 26.0–34.0)
MCHC: 32.9 g/dL (ref 30.0–36.0)
MCV: 94.3 fL (ref 78.0–100.0)
PLATELETS: 99 10*3/uL — AB (ref 150–400)
RBC: 2.29 MIL/uL — ABNORMAL LOW (ref 4.22–5.81)
RDW: 15.1 % (ref 11.5–15.5)
WBC: 7.6 10*3/uL (ref 4.0–10.5)

## 2017-04-20 LAB — URINALYSIS, ROUTINE W REFLEX MICROSCOPIC
BACTERIA UA: NONE SEEN
Bilirubin Urine: NEGATIVE
GLUCOSE, UA: 150 mg/dL — AB
KETONES UR: 5 mg/dL — AB
LEUKOCYTES UA: NEGATIVE
Nitrite: NEGATIVE
Specific Gravity, Urine: 1.014 (ref 1.005–1.030)
pH: 6 (ref 5.0–8.0)

## 2017-04-20 LAB — GLUCOSE, CAPILLARY
GLUCOSE-CAPILLARY: 175 mg/dL — AB (ref 65–99)
GLUCOSE-CAPILLARY: 151 mg/dL — AB (ref 65–99)
GLUCOSE-CAPILLARY: 151 mg/dL — AB (ref 65–99)
GLUCOSE-CAPILLARY: 143 mg/dL — AB (ref 65–99)
GLUCOSE-CAPILLARY: 252 mg/dL — AB (ref 65–99)
Glucose-Capillary: 197 mg/dL — ABNORMAL HIGH (ref 65–99)

## 2017-04-20 LAB — COMPREHENSIVE METABOLIC PANEL
ALBUMIN: 1.9 g/dL — AB (ref 3.5–5.0)
ALT: 173 U/L — ABNORMAL HIGH (ref 17–63)
AST: 339 U/L — AB (ref 15–41)
Alkaline Phosphatase: 82 U/L (ref 38–126)
Anion gap: 11 (ref 5–15)
BILIRUBIN TOTAL: 2 mg/dL — AB (ref 0.3–1.2)
BUN: 55 mg/dL — AB (ref 6–20)
CHLORIDE: 112 mmol/L — AB (ref 101–111)
CO2: 15 mmol/L — ABNORMAL LOW (ref 22–32)
CREATININE: 11.78 mg/dL — AB (ref 0.61–1.24)
Calcium: 8.1 mg/dL — ABNORMAL LOW (ref 8.9–10.3)
GFR calc Af Amer: 5 mL/min — ABNORMAL LOW (ref >60)
GFR, EST NON AFRICAN AMERICAN: 5 mL/min — AB (ref >60)
GLUCOSE: 192 mg/dL — AB (ref 65–99)
Potassium: 4 mmol/L (ref 3.5–5.1)
Sodium: 138 mmol/L (ref 135–145)
Total Protein: 6.7 g/dL (ref 6.5–8.1)

## 2017-04-20 LAB — RENAL FUNCTION PANEL
Albumin: 1.9 g/dL — ABNORMAL LOW (ref 3.5–5.0)
Anion gap: 10 (ref 5–15)
BUN: 55 mg/dL — AB (ref 6–20)
CHLORIDE: 111 mmol/L (ref 101–111)
CO2: 17 mmol/L — ABNORMAL LOW (ref 22–32)
CREATININE: 11.42 mg/dL — AB (ref 0.61–1.24)
Calcium: 8.1 mg/dL — ABNORMAL LOW (ref 8.9–10.3)
GFR calc Af Amer: 5 mL/min — ABNORMAL LOW (ref >60)
GFR, EST NON AFRICAN AMERICAN: 5 mL/min — AB (ref >60)
Glucose, Bld: 187 mg/dL — ABNORMAL HIGH (ref 65–99)
Phosphorus: 6.8 mg/dL — ABNORMAL HIGH (ref 2.5–4.6)
Potassium: 4 mmol/L (ref 3.5–5.1)
Sodium: 138 mmol/L (ref 135–145)

## 2017-04-20 LAB — EPSTEIN-BARR VIRUS VCA, IGG: EBV VCA IgG: >600 U/mL — ABNORMAL HIGH (ref 0.0–17.9)

## 2017-04-20 LAB — HEPATITIS B SURFACE ANTIGEN: HEP B S AG: NEGATIVE

## 2017-04-20 LAB — STREP PNEUMONIAE URINARY ANTIGEN: STREP PNEUMO URINARY ANTIGEN: NEGATIVE

## 2017-04-20 LAB — HIV ANTIBODY (ROUTINE TESTING W REFLEX): HIV Screen 4th Generation wRfx: NONREACTIVE

## 2017-04-20 LAB — EPSTEIN-BARR VIRUS VCA, IGM: EBV VCA IgM: <36 U/mL (ref 0.0–35.9)

## 2017-04-20 LAB — CMV IGM

## 2017-04-20 MED ORDER — AMLODIPINE BESYLATE 5 MG PO TABS: 5.0000 mg | ORAL_TABLET | Freq: Every day | ORAL | Status: DC

## 2017-04-20 MED ORDER — PIPERACILLIN-TAZOBACTAM 3.375 G IVPB
3.3750 g | Freq: Two times a day (BID) | INTRAVENOUS | Status: DC
  Administered 2017-04-20 – 2017-04-23 (×6): 3.375 g via INTRAVENOUS
  Filled 2017-04-20 (×10): qty 50

## 2017-04-20 MED ORDER — AMLODIPINE BESYLATE 5 MG PO TABS
5.0000 mg | ORAL_TABLET | ORAL | Status: DC
  Administered 2017-04-21 – 2017-04-24 (×4): 5 mg via ORAL
  Filled 2017-04-20 (×5): qty 1

## 2017-04-20 MED ORDER — DOXAZOSIN MESYLATE 2 MG PO TABS
2.0000 mg | ORAL_TABLET | Freq: Every day | ORAL | Status: DC
  Administered 2017-04-21 – 2017-04-24 (×4): 2 mg via ORAL
  Filled 2017-04-20 (×6): qty 1

## 2017-04-20 MED ORDER — METOPROLOL TARTRATE 25 MG PO TABS
25.0000 mg | ORAL_TABLET | Freq: Two times a day (BID) | ORAL | Status: DC
  Administered 2017-04-21 – 2017-04-25 (×9): 25 mg via ORAL
  Filled 2017-04-20 (×12): qty 1

## 2017-04-20 NOTE — Progress Notes (Signed)
Pt temp of 97.2 F and CBG 151. RN notified.

## 2017-04-20 NOTE — Progress Notes (Signed)
Dr. Derryl Harbor updated to BP and assessments. No new immediate orders, will place orders for home meds to start after second HD today.

## 2017-04-20 NOTE — Progress Notes (Signed)
Patient ID: James [name] [facility], male   DOB: Apr 06, 1971, 46 y.o.   MRN: [id number] S:Feels better O:BP 112/81   Pulse 90   Temp (!) 97.2 F (36.2 C) (Oral)   Resp 13   Ht 5\' 7"  (1.702 m)   Wt 87.2 kg (192 lb 3.9 oz) Comment: weighed in bed  SpO2 100%   BMI 30.11 kg/m   Intake/Output Summary (Last 24 hours) at 04/20/17 1036 Last data filed at 04/20/17 1000  Gross per 24 hour  Intake              860 ml  Output              850 ml  Net               10 ml   Intake/Output: I/O last 3 completed shifts: In: 550 [I.V.:550] Out: 850 [Urine:850]  Intake/Output this shift:  Total I/O In: 310 [P.O.:60; I.V.:150; IV Piggyback:100] Out: 0  Weight change:  Gen:NAD CVS: no rub Resp:occ rhonchi LKG:MWNUUV Ext: no edema   Recent Labs Lab 04/19/17 1401 04/19/17 1415 04/19/17 1900 04/19/17 1940 04/20/17 0543 04/20/17 0544  NA 132* 136 135 135 138 138  K 3.2* 3.3* 3.5 3.5 4.0 4.0  CL 105 110 112* 112* 112* 111  CO2 12*  --  13* 11* 15* 17*  GLUCOSE 211* 212* 164* 162* 192* 187*  BUN 54* 50* 54* 54* 55* 55*  CREATININE 12.08* 12.70* 11.37* 11.44* 11.78* 11.42*  ALBUMIN 2.5*  --  2.1* 2.1* 1.9* 1.9*  CALCIUM 8.9  --  7.8* 7.8* 8.1* 8.1*  PHOS  --   --  6.3* 6.4*  --  6.8*  AST 179*  --   --   --  339*  --   ALT 92*  --   --   --  173*  --    Liver Function Tests:  Recent Labs Lab 04/19/17 1401  04/19/17 1940 04/20/17 0543 04/20/17 0544  AST 179*  --   --  339*  --   ALT 92*  --   --  173*  --   ALKPHOS 99  --   --  82  --   BILITOT 1.3*  --   --  2.0*  --   PROT 8.0  --   --  6.7  --   ALBUMIN 2.5*  < > 2.1* 1.9* 1.9*  < > = values in this interval not displayed.  Recent Labs Lab 04/19/17 1401  LIPASE 45   No results for input(s): AMMONIA in the last 168 hours. CBC:  Recent Labs Lab 04/19/17 1401 04/19/17 1415 04/19/17 1900 04/20/17 0543  WBC 11.3*  --  10.9* 7.6  NEUTROABS 9.5*  --   --   --   HGB 9.6* 9.9* 7.5* 7.1*  HCT 27.4* 29.0* 22.5* 21.6*  MCV  93.2  --  93.8 94.3  PLT 127*  --  97* 99*   Cardiac Enzymes: No results for input(s): CKTOTAL, CKMB, CKMBINDEX, TROPONINI in the last 168 hours. CBG:  Recent Labs Lab 04/19/17 2018 04/19/17 2332 04/20/17 0317 04/20/17 0607 04/20/17 0833  GLUCAP 155* 172* 175* 151* 151*    Iron Studies: No results for input(s): IRON, TIBC, TRANSFERRIN, FERRITIN in the last 72 hours. Studies/Results: Dg Chest Port 1 View  Result Date: 04/20/2017 CLINICAL DATA:  Sepsis and pneumonia. EXAM: PORTABLE CHEST 1 VIEW COMPARISON:  04/19/2017 prior radiographs FINDINGS: Left IJ central venous catheter with tip overlying the  superior cavoatrial junction again noted. Cardiomegaly again identified. This is a mildly low volume film. Right upper lobe airspace disease/ consolidation has not significantly changed. No pneumothorax or definite pleural effusion. IMPRESSION: Low volumes without other significant change. Little change in right upper lobe airspace disease/consolidation. Electronically Signed   By: Margarette Canada M.D.   On: 04/20/2017 08:31   Dg Chest Portable 1 View  Result Date: 04/19/2017 CLINICAL DATA:  Dialysis catheter placement. EXAM: PORTABLE CHEST 1 VIEW COMPARISON:  Chest radiograph April 19, 2017 at 1405 hours FINDINGS: Interval placement dialysis catheter via LEFT internal jugular venous approach with distal tip projecting in distal superior vena cava. Cardiac silhouette is mildly enlarged. Pulmonary vascular congestion. Persistent RIGHT upper lobe consolidation without pleural effusion. No pneumothorax. Osseous structures are nonsuspicious. IMPRESSION: Dialysis catheter distal tip projects in distal superior vena cava. No pneumothorax. RIGHT upper lobe pneumonia. Followup PA and lateral chest X-ray is recommended in 3-4 weeks following trial of antibiotic therapy to ensure resolution and exclude underlying malignancy. Mild cardiomegaly and pulmonary vascular congestion. Electronically Signed   By:  Elon Alas M.D.   On: 04/19/2017 17:47   Dg Chest Port 1 View  Result Date: 04/19/2017 CLINICAL DATA:  Hypertension.  Sepsis. EXAM: PORTABLE CHEST 1 VIEW COMPARISON:  August 14, 2015 FINDINGS: There is extensive airspace consolidation in the right upper lobe. Lungs elsewhere clear. Heart is upper normal in size with pulmonary vascularity within normal limits. No adenopathy. No bone lesions. IMPRESSION: Extensive airspace opacity throughout much of the right upper lobe, most likely due to pneumonia. Lungs elsewhere clear. Heart upper normal in size. Followup PA and lateral chest radiographs recommended in 3-4 weeks following trial of antibiotic therapy to ensure resolution and exclude underlying malignancy. Electronically Signed   By: Lowella Grip III M.D.   On: 04/19/2017 14:21   . chlorhexidine  15 mL Mouth Rinse BID  . [START ON 04/22/2017] darbepoetin (ARANESP) injection - DIALYSIS  40 mcg Intravenous Q Mon-HD  . heparin  5,000 Units Subcutaneous Q8H  . hydrocortisone sod succinate (SOLU-CORTEF) inj  50 mg Intravenous Q6H  . insulin aspart  2-6 Units Subcutaneous Q4H  . mouth rinse  15 mL Mouth Rinse q12n4p  . pantoprazole (PROTONIX) IV  40 mg Intravenous Q24H    BMET    Component Value Date/Time   NA 138 04/20/2017 0544   K 4.0 04/20/2017 0544   CL 111 04/20/2017 0544   CO2 17 (L) 04/20/2017 0544   GLUCOSE 187 (H) 04/20/2017 0544   BUN 55 (H) 04/20/2017 0544   CREATININE 11.42 (H) 04/20/2017 0544   CALCIUM 8.1 (L) 04/20/2017 0544   GFRNONAA 5 (L) 04/20/2017 0544   GFRAA 5 (L) 04/20/2017 0544   CBC    Component Value Date/Time   WBC 7.6 04/20/2017 0543   RBC 2.29 (L) 04/20/2017 0543   HGB 7.1 (L) 04/20/2017 0543   HCT 21.6 (L) 04/20/2017 0543   PLT 99 (L) 04/20/2017 0543   MCV 94.3 04/20/2017 0543   MCH 31.0 04/20/2017 0543   MCHC 32.9 04/20/2017 0543   RDW 15.1 04/20/2017 0543   LYMPHSABS 1.0 04/19/2017 1401   MONOABS 0.8 04/19/2017 1401   EOSABS 0.0  04/19/2017 1401   BASOSABS 0.0 04/19/2017 1401    Assessment/Plan: 1. 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  Pt was uremic on presentation and has improved after his initial HD on 04/19/17. 1. Plan for HD again today 2.  He will require tunneled HD cath and AVF/AVG  3. He has discussed PD with Dr. Azalia Bilis and can f/u as an outpatient once stable. 2. RUL Pneumonia in immunocompromised patient with hypoxic respiratory distress- broad spectrum abx per PCCM and agree with holding prograf given low dose and h/o PTLD and active infection.  Agree with stress dose hydrocortisone and broad spectrum abx 3. ID- blood and urine cultures drawn.  abx per PCCM 4. Metabolic acidosis- plan for HD and agree with IV bicarb for now. 5. Abnormal LFT's- still climbing.   1. Ordered EBV/CMV PCR and abdominal US 6. AMS- likely metabolic encephalopathy related to uremia and infection/pneumonia with hypoxia. 7. HTN- stable 8. Vascular access- as above s/p temp HD cath for now and eventual tunneled HD cath and AVF/AVG 9. Anemia of CKD stage 5 and immunosuppression therapy. Will start ESA and check iron stores. 10. 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 Disposition- pt will need to establish outpatient dialysis with Dr. Nolene Bernheim, MD [facility] [phone]

## 2017-04-20 NOTE — Progress Notes (Signed)
PULMONARY / CRITICAL CARE MEDICINE   Name: James [name] MRN: [id number] DOB: 11/07/1970    ADMISSION DATE:  04/19/2017 CONSULTATION DATE:  04/19/17  REFERRING MD:  Dr Johnney Killian, [facility]  CHIEF COMPLAINT:  Respiratory distress, encephalopathy  HISTORY OF PRESENT ILLNESS:   46 year old man with history of hypertension, diabetes, renal failure status post renal transplant 03/2007. His transplant was complicated by EBV-associated lymphoproliferative disorder B-cell lymphoma, requiring cessation of his immunosuppression regimen and ultimately failure of his transplant. He has been under evaluation by his primary nephrologist Dr. Joesph July for re-initiation of HD. Proximally 5 days ago he developed nausea and was unable to take his oral medications. For the last 2 days he has been febrile. On the day of admission he began to exhibit confusion and tachypnea with dyspnea. He also complained of some right-sided chest discomfort. He was brought to the emergency Department for evaluation on 9/28 and was found to have acute renal failure with an anion gap metabolic acidosis, respiratory distress in the setting of a right upper lobe pneumonia on chest x-ray. He will be admitted to the ICU with severe sepsis, acute on chronic renal failure, right upper lobe pneumonia and evolving respiratory failure.  SUBJECTIVE:  Feeling better hungry  VITAL SIGNS: BP 121/87   Pulse (!) 102   Temp (!) 97.2 F (36.2 C) (Oral)   Resp (!) 21   Ht 5\' 7"  (1.702 m)   Wt 87.2 kg (192 lb 3.9 oz) Comment: weighed in bed  SpO2 100%   BMI 30.11 kg/m   HEMODYNAMICS:    VENTILATOR SETTINGS:    INTAKE / OUTPUT: I/O last 3 completed shifts: In: 550 [I.V.:550] Out: 850 [Urine:850]  PHYSICAL EXAMINATION: General: chronically ill appearing, no distress Neuro:  Wakes easily to voice, less confused today able to answer questions, better oriented, no focal deficits HEENT: large neck, oropharynx clear, moist, left IJ hemodialysis  catheter Cardiovascular:  regular, no murmur Lungs: distant but clear, no wheezing Abdomen:  Obese, soft, positive bowel sounds Musculoskeletal:  No deformities Skin:  No rashes  LABS:  BMET  Recent Labs Lab 04/19/17 1940 04/20/17 0543 04/20/17 0544  NA 135 138 138  K 3.5 4.0 4.0  CL 112* 112* 111  CO2 11* 15* 17*  BUN 54* 55* 55*  CREATININE 11.44* 11.78* 11.42*  GLUCOSE 162* 192* 187*    Electrolytes  Recent Labs Lab 04/19/17 1900 04/19/17 1940 04/20/17 0543 04/20/17 0544  CALCIUM 7.8* 7.8* 8.1* 8.1*  PHOS 6.3* 6.4*  --  6.8*    CBC  Recent Labs Lab 04/19/17 1401 04/19/17 1415 04/19/17 1900 04/20/17 0543  WBC 11.3*  --  10.9* 7.6  HGB 9.6* 9.9* 7.5* 7.1*  HCT 27.4* 29.0* 22.5* 21.6*  PLT 127*  --  97* 99*    Coag's  Recent Labs Lab 04/19/17 1401  INR 1.03    Sepsis Markers  Recent Labs Lab 04/19/17 1823 04/19/17 1836 04/19/17 1900 04/19/17 2158  LATICACIDVEN 0.90 1.0  --  0.8  PROCALCITON  --   --  >150.00  --     ABG  Recent Labs Lab 04/19/17 1857  PHART 7.257*  PCO2ART 24.2*  PO2ART 123.0*    Liver Enzymes  Recent Labs Lab 04/19/17 1401  04/19/17 1940 04/20/17 0543 04/20/17 0544  AST 179*  --   --  339*  --   ALT 92*  --   --  173*  --   ALKPHOS 99  --   --  82  --  BILITOT 1.3*  --   --  2.0*  --   ALBUMIN 2.5*  < > 2.1* 1.9* 1.9*  < > = values in this interval not displayed.  Cardiac Enzymes No results for input(s): TROPONINI, PROBNP in the last 168 hours.  Glucose  Recent Labs Lab 04/19/17 2018 04/19/17 2332 04/20/17 0317 04/20/17 0607 04/20/17 0833 04/20/17 1130  GLUCAP 155* 172* 175* 151* 151* 143*    Imaging Dg Chest Port 1 View  Result Date: 04/20/2017 CLINICAL DATA:  Sepsis and pneumonia. EXAM: PORTABLE CHEST 1 VIEW COMPARISON:  04/19/2017 prior radiographs FINDINGS: Left IJ central venous catheter with tip overlying the superior cavoatrial junction again noted. Cardiomegaly again  identified. This is a mildly low volume film. Right upper lobe airspace disease/ consolidation has not significantly changed. No pneumothorax or definite pleural effusion. IMPRESSION: Low volumes without other significant change. Little change in right upper lobe airspace disease/consolidation. Electronically Signed   By: Margarette Canada M.D.   On: 04/20/2017 08:31   Dg Chest Portable 1 View  Result Date: 04/19/2017 CLINICAL DATA:  Dialysis catheter placement. EXAM: PORTABLE CHEST 1 VIEW COMPARISON:  Chest radiograph April 19, 2017 at 1405 hours FINDINGS: Interval placement dialysis catheter via LEFT internal jugular venous approach with distal tip projecting in distal superior vena cava. Cardiac silhouette is mildly enlarged. Pulmonary vascular congestion. Persistent RIGHT upper lobe consolidation without pleural effusion. No pneumothorax. Osseous structures are nonsuspicious. IMPRESSION: Dialysis catheter distal tip projects in distal superior vena cava. No pneumothorax. RIGHT upper lobe pneumonia. Followup PA and lateral chest X-ray is recommended in 3-4 weeks following trial of antibiotic therapy to ensure resolution and exclude underlying malignancy. Mild cardiomegaly and pulmonary vascular congestion. Electronically Signed   By: Elon Alas M.D.   On: 04/19/2017 17:47   Dg Chest Port 1 View  Result Date: 04/19/2017 CLINICAL DATA:  Hypertension.  Sepsis. EXAM: PORTABLE CHEST 1 VIEW COMPARISON:  August 14, 2015 FINDINGS: There is extensive airspace consolidation in the right upper lobe. Lungs elsewhere clear. Heart is upper normal in size with pulmonary vascularity within normal limits. No adenopathy. No bone lesions. IMPRESSION: Extensive airspace opacity throughout much of the right upper lobe, most likely due to pneumonia. Lungs elsewhere clear. Heart upper normal in size. Followup PA and lateral chest radiographs recommended in 3-4 weeks following trial of antibiotic therapy to ensure  resolution and exclude underlying malignancy. Electronically Signed   By: Lowella Grip III M.D.   On: 04/19/2017 14:21     STUDIES:   CULTURES: Blood culture/28 >>  Urine culture 9/28 >>  CMV 9/28 >> negative EBV antibodies 9/28 >>  EBV DNA  9/28 >>  Legionella urine antigen 9/28 Pneumococcal antigen 9/28   ANTIBIOTICS: vanco 9/28 >>  Zosyn 9/28 >>   SIGNIFICANT EVENTS:  LINES/TUBES: L IJHD catheter 9/28 >>   DISCUSSION: 46 year old man, history ESRD and failing renal transplant. Also history PTLD B-cell lymphoma. Admitted with right upper lobe healthcare associated pneumonia, acute on chronic renal failure, metabolic acidosis, encephalopathy.   ASSESSMENT / PLAN:  PULMONARY A: Right upper lobe healthcare associated pneumonia Acute respiratory failure History of tobacco use P:   Respiratory status appears to be improved with improvement in his metabolic acidosis Albuterol as needed Continue antibiotics as below  CARDIOVASCULAR A:  Sinus tachycardia History hypertension, currently normotensive P:  Continue to hold home blood pressure regimen  RENAL A:   Acute on chronic renal failure History of renal transplant, PTLD, failed Anion gap metabolic acidosis in  the setting of uremia, severe sepsis P:   Continue to hold diuretics Appreciate nephrology assistance, hemodialysis per their schedule Anion gap improved to 10, will KVO bicarbonate drip    GASTROINTESTINAL A:   SUP Nutrition Transaminitis P:   Okay to initiate renal diet PPI for prophylaxis Right upper quadrant ultrasound pending, note transaminases increased 9/29  HEMATOLOGIC A:   History of PTLD, B-cell lymphoma treated rituximab P:  follow CBC Immunosuppression on hold  INFECTIOUS A:   Severe sepsis Right upper lobe healthcare associated pneumonia Hx EBV History aspergillus pneumonia 2011 P:   Continue Broad spectrum antibiotics, vancomycin and Zosyn pending culture  data Follow cultures to completion Continue stress dose steroids given chronic prednisone use EBV titer pending, antibody studies pending Consider restart vangalciclovir prophylaxis next 24h If decompensates consider CT chest, possible aspergillus. Nothing to support at this time.    ENDOCRINE A:   At risk adrenal insufficiency, chronic prednisone use Diabetes mellitus P:   Hydrocortisone stress dose ordered Continue to hold Levemir Sliding-scale insulin per ICU protocol  NEUROLOGIC A:   Toxic metabolic encephalopathy P:   RASS goal: 0 Minimize all sedating medications, treat underlying causes   FAMILY  - Updates: spoke with his fiancee and others at bedside in the ED  - Inter-disciplinary family meet or Palliative Care meeting due by:  04/26/17   Independent CC time 39 minutes   Baltazar Apo, MD, PhD 04/20/2017, 11:33 AM [place] [facility] [phone] or if no answer [phone]

## 2017-04-20 NOTE — Progress Notes (Signed)
HD tx initiated via HD cath w/o problem, AP: pull/push/flush well, VP: sluggish pull, push/flush well, VSS, will cont to monitor while on HD tx 

## 2017-04-21 DIAGNOSIS — N186 End stage renal disease: Secondary | ICD-10-CM

## 2017-04-21 DIAGNOSIS — J189 Pneumonia, unspecified organism: Secondary | ICD-10-CM

## 2017-04-21 HISTORY — DX: Pneumonia, unspecified organism: J18.9

## 2017-04-21 LAB — GLUCOSE, CAPILLARY
GLUCOSE-CAPILLARY: 307 mg/dL — AB (ref 65–99)
GLUCOSE-CAPILLARY: 185 mg/dL — AB (ref 65–99)
GLUCOSE-CAPILLARY: 367 mg/dL — AB (ref 65–99)
Glucose-Capillary: 141 mg/dL — ABNORMAL HIGH (ref 65–99)
Glucose-Capillary: 258 mg/dL — ABNORMAL HIGH (ref 65–99)
Glucose-Capillary: 347 mg/dL — ABNORMAL HIGH (ref 65–99)
Glucose-Capillary: 349 mg/dL — ABNORMAL HIGH (ref 65–99)
Glucose-Capillary: 353 mg/dL — ABNORMAL HIGH (ref 65–99)

## 2017-04-21 LAB — CBC
HEMATOCRIT: 22.6 % — AB (ref 39.0–52.0)
Hemoglobin: 7.8 g/dL — ABNORMAL LOW (ref 13.0–17.0)
MCH: 31.8 pg (ref 26.0–34.0)
MCHC: 34.5 g/dL (ref 30.0–36.0)
MCV: 92.2 fL (ref 78.0–100.0)
PLATELETS: 140 10*3/uL — AB (ref 150–400)
RBC: 2.45 MIL/uL — AB (ref 4.22–5.81)
RDW: 14.8 % (ref 11.5–15.5)
WBC: 7.9 10*3/uL (ref 4.0–10.5)

## 2017-04-21 LAB — COMPREHENSIVE METABOLIC PANEL
ALBUMIN: 2 g/dL — AB (ref 3.5–5.0)
ALK PHOS: 86 U/L (ref 38–126)
ALT: 183 U/L — ABNORMAL HIGH (ref 17–63)
AST: 176 U/L — AB (ref 15–41)
Anion gap: 12 (ref 5–15)
BILIRUBIN TOTAL: 0.9 mg/dL (ref 0.3–1.2)
BUN: 31 mg/dL — AB (ref 6–20)
CALCIUM: 8.4 mg/dL — AB (ref 8.9–10.3)
CO2: 24 mmol/L (ref 22–32)
CREATININE: 5.26 mg/dL — AB (ref 0.61–1.24)
Chloride: 97 mmol/L — ABNORMAL LOW (ref 101–111)
GFR calc Af Amer: 14 mL/min — ABNORMAL LOW (ref >60)
GFR calc non Af Amer: 12 mL/min — ABNORMAL LOW (ref >60)
GLUCOSE: 368 mg/dL — AB (ref 65–99)
POTASSIUM: 2.8 mmol/L — AB (ref 3.5–5.1)
Sodium: 133 mmol/L — ABNORMAL LOW (ref 135–145)
TOTAL PROTEIN: 7.3 g/dL (ref 6.5–8.1)

## 2017-04-21 LAB — URINE CULTURE: CULTURE: NO GROWTH

## 2017-04-21 LAB — LEGIONELLA PNEUMOPHILA SEROGP 1 UR AG: L. pneumophila Serogp 1 Ur Ag: NEGATIVE

## 2017-04-21 LAB — PHOSPHORUS: PHOSPHORUS: 4 mg/dL (ref 2.5–4.6)

## 2017-04-21 MED ORDER — SODIUM CHLORIDE 0.9 % IV SOLN
0.6250 mg/kg | INTRAVENOUS | Status: DC
  Administered 2017-04-22 – 2017-04-25 (×2): 55 mg via INTRAVENOUS
  Filled 2017-04-21 (×3): qty 55

## 2017-04-21 MED ORDER — VANCOMYCIN HCL IN DEXTROSE 1-5 GM/200ML-% IV SOLN
1000.0000 mg | Freq: Once | INTRAVENOUS | Status: AC
  Administered 2017-04-21: 1000 mg via INTRAVENOUS
  Filled 2017-04-21: qty 200

## 2017-04-21 MED ORDER — POTASSIUM CHLORIDE CRYS ER 20 MEQ PO TBCR
40.0000 meq | EXTENDED_RELEASE_TABLET | ORAL | Status: AC
  Administered 2017-04-21 (×2): 40 meq via ORAL
  Filled 2017-04-21 (×2): qty 2

## 2017-04-21 MED ORDER — INSULIN ASPART 100 UNIT/ML ~~LOC~~ SOLN
0.0000 [IU] | SUBCUTANEOUS | Status: DC
  Administered 2017-04-21: 15 [IU] via SUBCUTANEOUS
  Administered 2017-04-21: 11 [IU] via SUBCUTANEOUS
  Administered 2017-04-21: 2 [IU] via SUBCUTANEOUS
  Administered 2017-04-21: 3 [IU] via SUBCUTANEOUS

## 2017-04-21 MED ORDER — SODIUM BICARBONATE 650 MG PO TABS
1300.0000 mg | ORAL_TABLET | Freq: Three times a day (TID) | ORAL | Status: DC
  Administered 2017-04-21 – 2017-04-22 (×3): 1300 mg via ORAL
  Filled 2017-04-21 (×4): qty 2

## 2017-04-21 MED ORDER — HEPARIN SODIUM (PORCINE) 1000 UNIT/ML DIALYSIS: 20.0000 [IU]/kg | INTRAMUSCULAR | Status: DC | PRN

## 2017-04-21 MED ORDER — LATANOPROST 0.005 % OP SOLN
1.0000 [drp] | Freq: Every day | OPHTHALMIC | Status: DC
Start: 2017-04-21 — End: 2017-04-25
  Administered 2017-04-21 – 2017-04-23 (×3): 1 [drp] via OPHTHALMIC
  Filled 2017-04-21: qty 2.5

## 2017-04-21 MED ORDER — INSULIN ASPART 100 UNIT/ML ~~LOC~~ SOLN
0.0000 [IU] | Freq: Every day | SUBCUTANEOUS | Status: DC
  Administered 2017-04-21 – 2017-04-23 (×2): 3 [IU] via SUBCUTANEOUS
  Administered 2017-04-24: 2 [IU] via SUBCUTANEOUS

## 2017-04-21 MED ORDER — VALGANCICLOVIR HCL 450 MG PO TABS: 450.0000 mg | ORAL_TABLET | Freq: Every day | ORAL | Status: DC

## 2017-04-21 MED ORDER — PREDNISONE 5 MG PO TABS
5.0000 mg | ORAL_TABLET | Freq: Every day | ORAL | Status: DC
  Administered 2017-04-22 – 2017-04-25 (×4): 5 mg via ORAL
  Filled 2017-04-21 (×5): qty 1

## 2017-04-21 MED ORDER — INSULIN ASPART 100 UNIT/ML ~~LOC~~ SOLN
0.0000 [IU] | Freq: Three times a day (TID) | SUBCUTANEOUS | Status: DC
  Administered 2017-04-21: 7 [IU] via SUBCUTANEOUS
  Administered 2017-04-22: 2 [IU] via SUBCUTANEOUS
  Administered 2017-04-22: 1 [IU] via SUBCUTANEOUS
  Administered 2017-04-23: 2 [IU] via SUBCUTANEOUS
  Administered 2017-04-23: 5 [IU] via SUBCUTANEOUS
  Administered 2017-04-23 – 2017-04-24 (×2): 2 [IU] via SUBCUTANEOUS
  Administered 2017-04-24: 1 [IU] via SUBCUTANEOUS
  Administered 2017-04-25: 5 [IU] via SUBCUTANEOUS
  Administered 2017-04-25: 3 [IU] via SUBCUTANEOUS

## 2017-04-21 NOTE — Progress Notes (Signed)
Report called to [place] for 2W 37, tx w/ vss w/ Alfonso Ellis RN. Recvd by RN.

## 2017-04-21 NOTE — Progress Notes (Signed)
Patient ID: [facility], male   DOB: 1970/10/07, 46 y.o.   MRN: [id number]  [facility] Progress Note    Subjective:   Feels much better   Objective:   BP (!) 156/94   Pulse 98   Temp 98 F (36.7 C) (Oral)   Resp 19   Ht 5\' 7"  (1.702 m)   Wt 87.3 kg (192 lb 7.4 oz)   SpO2 100%   BMI 30.14 kg/m   Intake/Output: I/O last 3 completed shifts: In: 1703.3 [P.O.:560; I.V.:1043.3; IV Piggyback:100] Out: 1980 [Urine:2050]   Intake/Output this shift:  No intake/output data recorded. Weight change: -6.5 kg (-14 lb 5.3 oz)  Physical Exam: Gen:NAD CVS:no rub Resp:cta OIN:OMVEHM Ext:no edema, clotted LUE AVF  Labs: BMET  Recent Labs Lab 04/19/17 1401 04/19/17 1415 04/19/17 1900 04/19/17 1940 04/20/17 0543 04/20/17 0544  NA 132* 136 135 135 138 138  K 3.2* 3.3* 3.5 3.5 4.0 4.0  CL 105 110 112* 112* 112* 111  CO2 12*  --  13* 11* 15* 17*  GLUCOSE 211* 212* 164* 162* 192* 187*  BUN 54* 50* 54* 54* 55* 55*  CREATININE 12.08* 12.70* 11.37* 11.44* 11.78* 11.42*  ALBUMIN 2.5*  --  2.1* 2.1* 1.9* 1.9*  CALCIUM 8.9  --  7.8* 7.8* 8.1* 8.1*  PHOS  --   --  6.3* 6.4*  --  6.8*   CBC  Recent Labs Lab 04/19/17 1401 04/19/17 1415 04/19/17 1900 04/20/17 0543  WBC 11.3*  --  10.9* 7.6  NEUTROABS 9.5*  --   --   --   HGB 9.6* 9.9* 7.5* 7.1*  HCT 27.4* 29.0* 22.5* 21.6*  MCV 93.2  --  93.8 94.3  PLT 127*  --  97* 99*    @IMGRELPRIORS @ Medications:    . amLODipine  5 mg Oral Q24H  . chlorhexidine  15 mL Mouth Rinse BID  . [START ON 04/22/2017] darbepoetin (ARANESP) injection - DIALYSIS  40 mcg Intravenous Q Mon-HD  . doxazosin  2 mg Oral QHS  . heparin  5,000 Units Subcutaneous Q8H  . hydrocortisone sod succinate (SOLU-CORTEF) inj  50 mg Intravenous Q6H  . insulin aspart  0-15 Units Subcutaneous Q4H  . mouth rinse  15 mL Mouth Rinse q12n4p  . metoprolol tartrate  25 mg Oral BID  . pantoprazole (PROTONIX) IV  40 mg Intravenous Q24H      Assessment/ Plan:   1. 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 Pt was uremic on presentation and has improved after his initial HD on 04/19/17. 1. Plan for HD again tomorrow  2. He will require tunneled HD cath and AVF/AVG  3. He has discussed PD with Dr. Azalia Bilis and can f/u as an outpatient once stable. 2. RUL Pneumonia in immunocompromised patient with hypoxic respiratory distress- broad spectrum abx per PCCM and agree with holding prograf given low dose and h/o PTLD and active infection. Agree with stress dose hydrocortisone and broad spectrum abx 3. ID- blood and urine cultures drawn. abx per PCCM 4. Metabolic acidosis- plan for HD and agree with IV bicarb for now. 5. Abnormal LFT's- still climbing.   1. Ordered EBV/CMV PCR and abdominal US 6. AMS- likely metabolic encephalopathy related to uremia and infection/pneumonia with hypoxia. 7. HTN- stable 8. Vascular access- as above s/p temp HD cath for now and eventual tunneled HD cath and AVF/AVG 9. Anemia of CKD stage 5 and immunosuppression therapy. Will start ESA  and check iron stores. 10. 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 Disposition- pt will need to establish outpatient dialysis with Dr. Nolene Bernheim, MD [place] Pager [phone] 04/21/2017, 11:01 AM

## 2017-04-21 NOTE — Progress Notes (Signed)
eLink Physician-Brief Progress Note Patient Name: James [name] DOB: 1970-11-26 MRN: [id number]   Date of Service  04/21/2017  HPI/Events of Note  Notified by bedside nurse of hyperglycemia. Reportedly patient's family brought him a non-diet ginger ale. Currently on standard sliding scale insulin algorithm.   eICU Interventions  1. Continuing Accu-Cheks every 4 hours 2. Switching to moderate dose sliding scale insulin per algorithm         [facility] 04/21/2017, 12:48 AM

## 2017-04-21 NOTE — Progress Notes (Signed)
PULMONARY / CRITICAL CARE MEDICINE   Name: James [name] MRN: [id number] DOB: 03-28-1971    ADMISSION DATE:  04/19/2017 CONSULTATION DATE:  04/19/17  REFERRING MD:  Dr Johnney Killian, MCED  CHIEF COMPLAINT:  Respiratory distress, encephalopathy  HISTORY OF PRESENT ILLNESS:   46 year old man with history of hypertension, diabetes, renal failure status post renal transplant 03/2007. His transplant was complicated by EBV-associated lymphoproliferative disorder B-cell lymphoma, requiring cessation of his immunosuppression regimen and ultimately failure of his transplant. He has been under evaluation by his primary nephrologist Dr. Joesph July for re-initiation of HD. Proximally 5 days ago he developed nausea and was unable to take his oral medications. For the last 2 days he has been febrile. On the day of admission he began to exhibit confusion and tachypnea with dyspnea. He also complained of some right-sided chest discomfort. He was brought to the emergency Department for evaluation on 9/28 and was found to have acute renal failure with an anion gap metabolic acidosis, respiratory distress in the setting of a right upper lobe pneumonia on chest x-ray. He will be admitted to the ICU with severe sepsis, acute on chronic renal failure, right upper lobe pneumonia and evolving respiratory failure.  SUBJECTIVE:  Up to chair Mental status has cleared Hemodynamically improved  VITAL SIGNS: BP (!) 156/94   Pulse 98   Temp 98 F (36.7 C) (Oral)   Resp 19   Ht 5\' 7"  (1.702 m)   Wt 87.3 kg (192 lb 7.4 oz)   SpO2 100%   BMI 30.14 kg/m   HEMODYNAMICS:    VENTILATOR SETTINGS:    INTAKE / OUTPUT: I/O last 3 completed shifts: In: 1703.3 [P.O.:560; I.V.:1043.3; IV Piggyback:100] Out: 1980 [Urine:2050]  PHYSICAL EXAMINATION: General: Up to chair, more comfortable, no distress Neuro:  Awake, interacting, answers questions appropriately, fully oriented HEENT: Oropharynx clear, left IJ hemodialysis  catheter in place Cardiovascular: Regular, no murmur, left upper arm fistula without a pulse or thrill Lungs: Distant but clear, no wheezing Abdomen:  Obese, soft, positive bowel sounds Musculoskeletal:  No deformities Skin:  No rash  LABS:  BMET  Recent Labs Lab 04/19/17 1940 04/20/17 0543 04/20/17 0544  NA 135 138 138  K 3.5 4.0 4.0  CL 112* 112* 111  CO2 11* 15* 17*  BUN 54* 55* 55*  CREATININE 11.44* 11.78* 11.42*  GLUCOSE 162* 192* 187*    Electrolytes  Recent Labs Lab 04/19/17 1900 04/19/17 1940 04/20/17 0543 04/20/17 0544  CALCIUM 7.8* 7.8* 8.1* 8.1*  PHOS 6.3* 6.4*  --  6.8*    CBC  Recent Labs Lab 04/19/17 1401 04/19/17 1415 04/19/17 1900 04/20/17 0543  WBC 11.3*  --  10.9* 7.6  HGB 9.6* 9.9* 7.5* 7.1*  HCT 27.4* 29.0* 22.5* 21.6*  PLT 127*  --  97* 99*    Coag's  Recent Labs Lab 04/19/17 1401  INR 1.03    Sepsis Markers  Recent Labs Lab 04/19/17 1823 04/19/17 1836 04/19/17 1900 04/19/17 2158  LATICACIDVEN 0.90 1.0  --  0.8  PROCALCITON  --   --  >150.00  --     ABG  Recent Labs Lab 04/19/17 1857  PHART 7.257*  PCO2ART 24.2*  PO2ART 123.0*    Liver Enzymes  Recent Labs Lab 04/19/17 1401  04/19/17 1940 04/20/17 0543 04/20/17 0544  AST 179*  --   --  339*  --   ALT 92*  --   --  173*  --   ALKPHOS 99  --   --  82  --   BILITOT 1.3*  --   --  2.0*  --   ALBUMIN 2.5*  < > 2.1* 1.9* 1.9*  < > = values in this interval not displayed.  Cardiac Enzymes No results for input(s): TROPONINI, PROBNP in the last 168 hours.  Glucose  Recent Labs Lab 04/20/17 1621 04/20/17 1931 04/21/17 0027 04/21/17 0125 04/21/17 0344 04/21/17 0834  GLUCAP 197* 252* 349* 307* 141* 185*    Imaging No results found.   STUDIES:   CULTURES: Blood culture/28 >>  Urine culture 9/28 >>  EBV antibodies 9/28 >> IgM negative, IgG positive EBV DNA  9/28 >>  CMV IgM 9/28 >> negative CMV DNA 9/28 >>  Legionella urine antigen  9/28 >>  Pneumococcal antigen 9/28 >> negative Hep B sAg 9/29 >> negative Hep B cAb 9/29 >>  Hep B sAb 9/29 >>  HIV Ab 9/28 >> negative   ANTIBIOTICS: vanco 9/28 >>  Zosyn 9/28 >>    SIGNIFICANT EVENTS:  LINES/TUBES: L IJHD catheter 9/28 >>   DISCUSSION: 46 year old man, history ESRD and failing renal transplant. Also history PTLD B-cell lymphoma. Admitted with right upper lobe healthcare associated pneumonia, acute on chronic renal failure, metabolic acidosis, encephalopathy. Improving 9/29 and 9/30  ASSESSMENT / PLAN:  PULMONARY A: Right upper lobe healthcare associated pneumonia Acute respiratory failure History of tobacco use P:   Pulmonary hygiene Albuterol as needed Antibiotics as below  CARDIOVASCULAR A:  Sinus tachycardia History hypertension, currently normotensive P:  Holding home blood pressure medications. Restart as he stabilizes  RENAL A:   Acute on chronic renal failure History of renal transplant, PTLD, failed Anion gap metabolic acidosis in the setting of uremia, severe sepsis P:   Holding diuretics Continue hemodialysis per nephrology schedule, appreciate their assistance Discontinue sodium bicarbonate drip He will likely need a tunneled catheter for semipermanent access until he is able to review options with his outpatient nephrologist  GASTROINTESTINAL A:   SUP Nutrition Transaminitis, right upper quadrant ultrasound reassuring P:   Continue renal diet PPI for prophylaxis  HEMATOLOGIC A:   History of PTLD, B-cell lymphoma treated rituximab P:  Follow CBC Change hydrocortisone back to prednisone. Low-dose tacrolimus remains on hold, likely restart near future  INFECTIOUS A:   Severe sepsis Right upper lobe healthcare associated pneumonia Hx EBV History aspergillus pneumonia 2011 P:   Continue broad-spectrum antibiotics. Likely wean off vancomycin 10/1 if no positive cultures. Transition to by mouth soon Follow culture  data, viral studies Change stress dose steroids back to his usual prednisone Restart vangalciclovir prophylaxis 9/30   ENDOCRINE A:   At risk adrenal insufficiency, chronic prednisone use Diabetes mellitus P:   Continue to hold Levemir, restart when we ensure that he's taking good by mouth diet Sliding-scale insulin per ICU protocol  NEUROLOGIC A:   Toxic metabolic encephalopathy P:   RASS goal: 0 Minimize all sedating medications, treat underlying causes   FAMILY  - Updates: spoke with his fiancee and others at bedside in the ED. Reviewed with patient on 9/29, 9/30  - Inter-disciplinary family meet or Palliative Care meeting due by:  04/26/17  Transfer to renal Floor, to [place] as of 10/1  Baltazar Apo, MD, PhD 04/21/2017, 11:56 AM [place] [facility] [phone] or if no answer [phone]

## 2017-04-21 NOTE — Progress Notes (Signed)
Pharmacy Antibiotic Note  James [name] is a 46 y.o. male admitted on 04/19/2017 with sepsis. .   Plan: vanc 1 g x 1 this am  Height: 5\' 7"  (170.2 cm) Weight: 192 lb 7.4 oz (87.3 kg) IBW/kg (Calculated) : 66.1  Temp (24hrs), Avg:97.9 F (36.6 C), Min:97.4 F (36.3 C), Max:98.4 F (36.9 C)   Recent Labs Lab 04/19/17 1401 04/19/17 1415 04/19/17 1416 04/19/17 1823 04/19/17 1836 04/19/17 1900 04/19/17 1940 04/19/17 2158 04/20/17 0543 04/20/17 0544  WBC 11.3*  --   --   --   --  10.9*  --   --  7.6  --   CREATININE 12.08* 12.70*  --   --   --  11.37* 11.44*  --  11.78* 11.42*  LATICACIDVEN  --   --  1.59 0.90 1.0  --   --  0.8  --   --     Estimated Creatinine Clearance: 8.7 mL/min (A) (by C-G formula based on SCr of 11.42 mg/dL (H)).    Allergies  Allergen Reactions  . Darvon [Propoxyphene] Rash    Broke out on eyelid   . Tramadol Rash   Levester Fresh, PharmD, BCPS, BCCCP Clinical Pharmacist Clinical phone for 04/21/2017 from 7a-3:30p: [phone] If after 3:30p, please call main pharmacy at: [phone] 04/21/2017 10:48 AM

## 2017-04-21 NOTE — Progress Notes (Signed)
Lab called with critical K+ 2.8 at 1830, Dr. Halford Chessman immediately and orders recvd.

## 2017-04-21 NOTE — Progress Notes (Signed)
Arrived to unit via wheelchair in stable condition (transfer from 87M). Telephone report received from [place], [place], prior to arrival. A&O x3. VSS. Respirations even/unlabored. Tele monitor placed & CCMD notified. Oriented to room and unit with understanding verbalized. No needs at this time. Will continue to monitor.

## 2017-04-21 NOTE — Progress Notes (Signed)
Potassium low.  Will replace orally.  Chesley Mires, MD [facility] 04/21/2017, 5:33 PM

## 2017-04-22 ENCOUNTER — Encounter (HOSPITAL_COMMUNITY): Payer: Self-pay | Admitting: General Practice

## 2017-04-22 DIAGNOSIS — R740 Nonspecific elevation of levels of transaminase and lactic acid dehydrogenase [LDH]: Secondary | ICD-10-CM

## 2017-04-22 DIAGNOSIS — R778 Other specified abnormalities of plasma proteins: Secondary | ICD-10-CM

## 2017-04-22 DIAGNOSIS — N189 Chronic kidney disease, unspecified: Secondary | ICD-10-CM

## 2017-04-22 DIAGNOSIS — E876 Hypokalemia: Secondary | ICD-10-CM

## 2017-04-22 LAB — COMPREHENSIVE METABOLIC PANEL
ALK PHOS: 72 U/L (ref 38–126)
ALT: 149 U/L — AB (ref 17–63)
AST: 129 U/L — ABNORMAL HIGH (ref 15–41)
Albumin: 1.9 g/dL — ABNORMAL LOW (ref 3.5–5.0)
Anion gap: 10 (ref 5–15)
BUN: 41 mg/dL — ABNORMAL HIGH (ref 6–20)
CALCIUM: 8.5 mg/dL — AB (ref 8.9–10.3)
CO2: 25 mmol/L (ref 22–32)
CREATININE: 6.31 mg/dL — AB (ref 0.61–1.24)
Chloride: 104 mmol/L (ref 101–111)
GFR, EST AFRICAN AMERICAN: 11 mL/min — AB (ref >60)
GFR, EST NON AFRICAN AMERICAN: 10 mL/min — AB (ref >60)
Glucose, Bld: 184 mg/dL — ABNORMAL HIGH (ref 65–99)
Potassium: 2.5 mmol/L — CL (ref 3.5–5.1)
Sodium: 139 mmol/L (ref 135–145)
Total Bilirubin: 0.9 mg/dL (ref 0.3–1.2)
Total Protein: 6.5 g/dL (ref 6.5–8.1)

## 2017-04-22 LAB — GLUCOSE, CAPILLARY
GLUCOSE-CAPILLARY: 147 mg/dL — AB (ref 65–99)
GLUCOSE-CAPILLARY: 204 mg/dL — AB (ref 65–99)
GLUCOSE-CAPILLARY: 219 mg/dL — AB (ref 65–99)
Glucose-Capillary: 155 mg/dL — ABNORMAL HIGH (ref 65–99)

## 2017-04-22 LAB — HEPATITIS B CORE ANTIBODY, TOTAL: HEP B C TOTAL AB: NEGATIVE

## 2017-04-22 LAB — HEPATITIS B SURFACE ANTIBODY,QUALITATIVE: HEP B S AB: NONREACTIVE

## 2017-04-22 MED ORDER — POTASSIUM CHLORIDE CRYS ER 20 MEQ PO TBCR
40.0000 meq | EXTENDED_RELEASE_TABLET | ORAL | Status: AC
  Administered 2017-04-22 (×2): 40 meq via ORAL
  Filled 2017-04-22 (×2): qty 2

## 2017-04-22 MED ORDER — INFLUENZA VAC SPLIT QUAD 0.5 ML IM SUSY
0.5000 mL | PREFILLED_SYRINGE | INTRAMUSCULAR | Status: AC
  Administered 2017-04-23: 0.5 mL via INTRAMUSCULAR
  Filled 2017-04-22: qty 0.5

## 2017-04-22 MED ORDER — DARBEPOETIN ALFA 40 MCG/0.4ML IJ SOSY
PREFILLED_SYRINGE | INTRAMUSCULAR | Status: AC
  Administered 2017-04-22: 40 ug via INTRAVENOUS
  Filled 2017-04-22: qty 0.4

## 2017-04-22 MED ORDER — INSULIN DETEMIR 100 UNIT/ML ~~LOC~~ SOLN
15.0000 [IU] | Freq: Every day | SUBCUTANEOUS | Status: DC
  Administered 2017-04-22 – 2017-04-24 (×3): 15 [IU] via SUBCUTANEOUS
  Filled 2017-04-22 (×4): qty 0.15

## 2017-04-22 MED ORDER — VANCOMYCIN HCL IN DEXTROSE 1-5 GM/200ML-% IV SOLN
1000.0000 mg | Freq: Once | INTRAVENOUS | Status: DC
Start: 2017-04-22 — End: 2017-04-22
  Filled 2017-04-22: qty 200

## 2017-04-22 NOTE — Progress Notes (Signed)
Patient ID: James [name] [name] male   DOB: [birth year], 46 y.o.   MRN: [id number]  [facility] Progress Note    Subjective:   Feels much better. Says he needs a more permanent catheter and a new fistula.    Objective:   BP 109/65 (BP Location: Right Arm)   Pulse 97   Temp 98.2 F (36.8 C) (Oral)   Resp (!) 26   Ht 5\' 7"  (1.702 m)   Wt 87.1 kg (192 lb 1 oz)   SpO2 99%   BMI 30.08 kg/m   Intake/Output: I/O last 3 completed shifts: In: 1130 [P.O.:960; I.V.:120; IV Piggyback:50] Out: 980 [Urine:1050]   Intake/Output this shift:  Total I/O In: 240 [P.O.:240] Out: -  Weight change: 0.3 kg (10.6 oz)  Physical Exam: Gen:NAD CVS:no rub Resp:cta GUR:KYHCWC Ext:no edema, clotted LUE AVF  Labs: BMET  Recent Labs Lab 04/19/17 1401 04/19/17 1415 04/19/17 1900 04/19/17 1940 04/20/17 0543 04/20/17 0544 04/21/17 1231 04/22/17 0314  NA 132* 136 135 135 138 138 133* 139  K 3.2* 3.3* 3.5 3.5 4.0 4.0 2.8* 2.5*  CL 105 110 112* 112* 112* 111 97* 104  CO2 12*  --  13* 11* 15* 17* 24 25  GLUCOSE 211* 212* 164* 162* 192* 187* 368* 184*  BUN 54* 50* 54* 54* 55* 55* 31* 41*  CREATININE 12.08* 12.70* 11.37* 11.44* 11.78* 11.42* 5.26* 6.31*  ALBUMIN 2.5*  --  2.1* 2.1* 1.9* 1.9* 2.0* 1.9*  CALCIUM 8.9  --  7.8* 7.8* 8.1* 8.1* 8.4* 8.5*  PHOS  --   --  6.3* 6.4*  --  6.8* 4.0  --    CBC  Recent Labs Lab 04/19/17 1401 04/19/17 1415 04/19/17 1900 04/20/17 0543 04/21/17 1231  WBC 11.3*  --  10.9* 7.6 7.9  NEUTROABS 9.5*  --   --   --   --   HGB 9.6* 9.9* 7.5* 7.1* 7.8*  HCT 27.4* 29.0* 22.5* 21.6* 22.6*  MCV 93.2  --  93.8 94.3 92.2  PLT 127*  --  97* 99* 140*    @IMGRELPRIORS @ Medications:    . amLODipine  5 mg Oral Q24H  . darbepoetin (ARANESP) injection - DIALYSIS  40 mcg Intravenous Q Mon-HD  . doxazosin  2 mg Oral QHS  . heparin  5,000 Units Subcutaneous Q8H  . [START ON 04/23/2017] Influenza vac split quadrivalent PF  0.5 mL Intramuscular  Tomorrow-1000  . insulin aspart  0-5 Units Subcutaneous QHS  . insulin aspart  0-9 Units Subcutaneous TID WC  . insulin detemir  15 Units Subcutaneous Daily  . latanoprost  1 drop Both Eyes QHS  . metoprolol tartrate  25 mg Oral BID  . potassium chloride  40 mEq Oral Q4H  . predniSONE  5 mg Oral Q breakfast     Assessment/ Plan:   1.  CKD stage 5/ failed renal Tx - now back on HD w temp cath.  Needs VVS consult for [facility] and new AVF. Will f/u for HD with Dr Azalia Bilis in [facility]. Uremia better after HD.  2.  RUL PNA - per primary team 3.  HTN - stable 4.  AMS - resolved, due to uremia 5.  Anemia - started esa 6.  Dispo - will call Dr Azalia Bilis tomorrow to establish OP HD  Kelly Splinter MD [facility] pgr [phone]   04/22/2017, 4:47 PM

## 2017-04-22 NOTE — Progress Notes (Signed)
PROGRESS NOTE  James [name] [facility] MBT:[id number] DOB: [birth year] DOA: 04/19/2017 PCP: Patient, No Pcp Per   LOS: 3 days   Brief Narrative / Interim history: 46 year old male with medical history significant for HTN, Diabetes, and renal failure s/p renal transplant in 5364 which was complicated by EBV-associated lymphoproliferative disorder B-Cell lymphoma.  This required cessation of his immunosuppressive regimen and ultimately led to failure of his transplant. About one week ago the patient became nauseas and was unable to take his PO medications. He became febrile 2 days before admission and the next day he developed confusion, tachypnea, dyspnea, and some right sided chest discomfort.    In ED he was found to have acute renal failure, anion gap metabolic acidosis, and respiratory distress. CXR revealed right upper lobe pneumonia. He was admitted to ICU with sepsis, acute on chronic renal failure and right upper lobe pneumonia with evolving respiratory failure.   Assessment & Plan: Active Problems:   HCAP (healthcare-associated pneumonia)   1. Right upper lobe-healthcare associated pneumonia  - Breathing has continued to improve, no shortness of breath - Continue IV Zosyn - Albuterol PRN   2. Anion Gap metabolic acidosis in setting of severe sepsis due to uremia - Blood cultures with no growth in 2 days - Urine cultures negative - Stress dose of steroid was reduced yesterday to patient's usually prednisone dose  3. Acute on chronic renal failure with history of failed renal transplant - Followed by nephrology and patient in contact with primary nephrologist - Continue Hemodialysis per nephrology recommendations - patient will likely need tunneled HD cath and AVF/AVG - Once stable will need to follow up with primary nephrologist.  4. Encephalopathy, metabolic/toxic - Likely in setting of uremia along with infection with hypoxia - Mental status is improved; Continue to monitor  5.  Hypokalemia - Potassium 2.5 today  - Replete and recheck BMP tomorrow  6. Anemia of Chronic kidney disease - Hgb improved from yesterday, now 7.8 - Recheck CBC tomorrow to ensure it continues to improve  7. Diabetes mellitus, type 2 - Continue to monitor CBG - Continue Novolog sliding scale at bedtime and 3X daily with meals  8. Hypertension - Well-controlled with Norvasc   DVT prophylaxis: heparin injection q8h Code Status: Full Family Communication: none at bedside Disposition Plan: home   Consultants:   Nephrology  Procedures:   HD: 9/30  Antimicrobials:  Zosyn   Subjective: Patient reports that he is feeling fine today and has no new complaints. He reports being very confused a couple days ago but says this has resolved. He expressed desire to speak with his primary nephrologist before making decisions concerning the management of his kidney disease.   Objective: Vitals:   04/21/17 2100 04/22/17 0121 04/22/17 0444 04/22/17 0911  BP: 131/78  115/71 117/74  Pulse: 97  90 88  Resp: 19  18 18   Temp: 98.5 F (36.9 C)  98.3 F (36.8 C) 98.2 F (36.8 C)  TempSrc: Oral  Oral Oral  SpO2: 100%  97% 97%  Weight: 87.5 kg (192 lb 14.4 oz) 87.5 kg (192 lb 14.4 oz)    Height:        Intake/Output Summary (Last 24 hours) at 04/22/17 1042 Last data filed at 04/22/17 0900  Gross per 24 hour  Intake             1130 ml  Output              500 ml  Net  630 ml   Filed Weights   04/21/17 0532 04/21/17 2100 04/22/17 0121  Weight: 87.3 kg (192 lb 7.4 oz) 87.5 kg (192 lb 14.4 oz) 87.5 kg (192 lb 14.4 oz)    Examination:  General Statement: resting comfortably in bed, no acute distress Eyes: pupils equal and round, no scleral icterus Respiratory: no accessory muscles use, breathed is non-labored, no cyanosis Cardiovascular: no lower extremity edema Abdomen: non-distended Musculoskeletal: normal muscle tone, no deformity Psychiatric: normal mood, alert  and oriented X3   Data Reviewed: I have independently reviewed following labs and imaging studies   CBC:  Recent Labs Lab 04/19/17 1401 04/19/17 1415 04/19/17 1900 04/20/17 0543 04/21/17 1231  WBC 11.3*  --  10.9* 7.6 7.9  NEUTROABS 9.5*  --   --   --   --   HGB 9.6* 9.9* 7.5* 7.1* 7.8*  HCT 27.4* 29.0* 22.5* 21.6* 22.6*  MCV 93.2  --  93.8 94.3 92.2  PLT 127*  --  97* 99* 301*   Basic Metabolic Panel:  Recent Labs Lab 04/19/17 1900 04/19/17 1940 04/20/17 0543 04/20/17 0544 04/21/17 1231 04/22/17 0314  NA 135 135 138 138 133* 139  K 3.5 3.5 4.0 4.0 2.8* 2.5*  CL 112* 112* 112* 111 97* 104  CO2 13* 11* 15* 17* 24 25  GLUCOSE 164* 162* 192* 187* 368* 184*  BUN 54* 54* 55* 55* 31* 41*  CREATININE 11.37* 11.44* 11.78* 11.42* 5.26* 6.31*  CALCIUM 7.8* 7.8* 8.1* 8.1* 8.4* 8.5*  PHOS 6.3* 6.4*  --  6.8* 4.0  --    GFR: Estimated Creatinine Clearance: 15.8 mL/min (A) (by C-G formula based on SCr of 6.31 mg/dL (H)). Liver Function Tests:  Recent Labs Lab 04/19/17 1401  04/19/17 1940 04/20/17 0543 04/20/17 0544 04/21/17 1231 04/22/17 0314  AST 179*  --   --  339*  --  176* 129*  ALT 92*  --   --  173*  --  183* 149*  ALKPHOS 99  --   --  82  --  86 72  BILITOT 1.3*  --   --  2.0*  --  0.9 0.9  PROT 8.0  --   --  6.7  --  7.3 6.5  ALBUMIN 2.5*  < > 2.1* 1.9* 1.9* 2.0* 1.9*  < > = values in this interval not displayed.  Recent Labs Lab 04/19/17 1401  LIPASE 45   No results for input(s): AMMONIA in the last 168 hours. Coagulation Profile:  Recent Labs Lab 04/19/17 1401  INR 1.03   Cardiac Enzymes: No results for input(s): CKTOTAL, CKMB, CKMBINDEX, TROPONINI in the last 168 hours. BNP (last 3 results) No results for input(s): PROBNP in the last 8760 hours. HbA1C: No results for input(s): HGBA1C in the last 72 hours. CBG:  Recent Labs Lab 04/21/17 1241 04/21/17 1331 04/21/17 1655 04/21/17 2150 04/22/17 0753  GLUCAP 353* 367* 347* 258* 147*    Lipid Profile: No results for input(s): CHOL, HDL, LDLCALC, TRIG, CHOLHDL, LDLDIRECT in the last 72 hours. Thyroid Function Tests: No results for input(s): TSH, T4TOTAL, FREET4, T3FREE, THYROIDAB in the last 72 hours. Anemia Panel: No results for input(s): VITAMINB12, FOLATE, FERRITIN, TIBC, IRON, RETICCTPCT in the last 72 hours. Urine analysis:    Component Value Date/Time   COLORURINE YELLOW 04/20/2017 1912   APPEARANCEUR CLEAR 04/20/2017 1912   LABSPEC 1.014 04/20/2017 1912   PHURINE 6.0 04/20/2017 1912   GLUCOSEU 150 (A) 04/20/2017 1912   HGBUR LARGE (A) 04/20/2017  [place] 04/20/2017 1912   KETONESUR 5 (A) 04/20/2017 1912   PROTEINUR >=300 (A) 04/20/2017 1912   UROBILINOGEN 0.2 04/09/2014 2013   NITRITE NEGATIVE 04/20/2017 1912   LEUKOCYTESUR NEGATIVE 04/20/2017 1912   Sepsis Labs: Invalid input(s): PROCALCITONIN, LACTICIDVEN  Recent Results (from the past 240 hour(s))  Blood Culture (routine x 2)     Status: None (Preliminary result)   Collection Time: 04/19/17  2:12 PM  Result Value Ref Range Status   Specimen Description BLOOD RIGHT ANTECUBITAL  Final   Special Requests   Final    BOTTLES DRAWN AEROBIC AND ANAEROBIC Blood Culture adequate volume   Culture NO GROWTH 2 DAYS  Final   Report Status PENDING  Incomplete  Blood Culture (routine x 2)     Status: None (Preliminary result)   Collection Time: 04/19/17  2:30 PM  Result Value Ref Range Status   Specimen Description BLOOD LEFT WRIST  Final   Special Requests   Final    BOTTLES DRAWN AEROBIC AND ANAEROBIC Blood Culture adequate volume   Culture NO GROWTH 2 DAYS  Final   Report Status PENDING  Incomplete  MRSA PCR Screening     Status: None   Collection Time: 04/19/17  6:28 PM  Result Value Ref Range Status   MRSA by PCR NEGATIVE NEGATIVE Final    Comment:        The GeneXpert MRSA Assay (FDA approved for NASAL specimens only), is one component of a comprehensive MRSA  colonization surveillance program. It is not intended to diagnose MRSA infection nor to guide or monitor treatment for MRSA infections.   Urine culture     Status: None   Collection Time: 04/20/17  7:11 PM  Result Value Ref Range Status   Specimen Description URINE, RANDOM  Final   Special Requests NONE  Final   Culture NO GROWTH  Final   Report Status 04/21/2017 FINAL  Final      Radiology Studies: US Abdomen Limited Ruq  Result Date: 04/20/2017 CLINICAL DATA:  Transaminitis. Renal transplant. Gallbladder removed. EXAM: ULTRASOUND ABDOMEN LIMITED RIGHT UPPER QUADRANT COMPARISON:  08/14/2015 FINDINGS: Gallbladder: Status post cholecystectomy. Common bile duct: Diameter: 3.0 mm Liver: Liver is enlarged and echogenic. There is attenuation of the ultrasound wave, poor visualization of the internal hepatic architecture, and loss of definition of the diaphragm. No focal mass identified. Portal vein is patent on color Doppler imaging with normal direction of blood flow towards the liver. Study quality is degraded by patient body habitus and overlying bowel gas. IMPRESSION: 1. Hepatic steatosis. 2. No focal liver lesion. 3. Status post cholecystectomy. Electronically Signed   By: Nolon Nations M.D.   On: 04/20/2017 11:42     Scheduled Meds: . amLODipine  5 mg Oral Q24H  . darbepoetin (ARANESP) injection - DIALYSIS  40 mcg Intravenous Q Mon-HD  . doxazosin  2 mg Oral QHS  . heparin  5,000 Units Subcutaneous Q8H  . [START ON 04/23/2017] Influenza vac split quadrivalent PF  0.5 mL Intramuscular Tomorrow-1000  . insulin aspart  0-5 Units Subcutaneous QHS  . insulin aspart  0-9 Units Subcutaneous TID WC  . latanoprost  1 drop Both Eyes QHS  . metoprolol tartrate  25 mg Oral BID  . predniSONE  5 mg Oral Q breakfast  . sodium bicarbonate  1,300 mg Oral TID   Continuous Infusions: . sodium chloride    . sodium chloride    . sodium chloride    .  ganciclovir (CYTOVENE) IV    .  piperacillin-tazobactam (ZOSYN)  IV 3.375 g (04/22/17 1004)  . vancomycin       Arlana Lindau, PA-S [facility] 04/22/2017, 10:42 AM

## 2017-04-22 NOTE — Care Management Note (Signed)
Case Management Note  Patient Details  Name: James [name] MRN: [id number] Date of Birth: 1971-07-05  Subjective/Objective:     CM following for progression and d/c planning.                Action/Plan: 04/22/2017 Noted CM consult for [place] needs, will follow for orders, unclear of needs for 46 yr old independent prior to admission, no PT /OT recommendations at this time.   Expected Discharge Date:                  Expected Discharge Plan:  [place]  In-House Referral:  NA  Discharge planning Services  CM Consult  Post Acute Care Choice:    Choice offered to:     DME Arranged:    DME Agency:     HH Arranged:    HH Agency:     Status of Service:  In process, will continue to follow  If discussed at Long Length of Stay Meetings, dates discussed:    Additional Comments:  Adron Bene, RN 04/22/2017, 2:19 PM

## 2017-04-23 DIAGNOSIS — N185 Chronic kidney disease, stage 5: Secondary | ICD-10-CM

## 2017-04-23 DIAGNOSIS — J189 Pneumonia, unspecified organism: Secondary | ICD-10-CM

## 2017-04-23 DIAGNOSIS — J181 Lobar pneumonia, unspecified organism: Secondary | ICD-10-CM

## 2017-04-23 LAB — COMPREHENSIVE METABOLIC PANEL
ALK PHOS: 75 U/L (ref 38–126)
ALT: 121 U/L — AB (ref 17–63)
ANION GAP: 13 (ref 5–15)
AST: 80 U/L — ABNORMAL HIGH (ref 15–41)
Albumin: 2.1 g/dL — ABNORMAL LOW (ref 3.5–5.0)
BILIRUBIN TOTAL: 0.9 mg/dL (ref 0.3–1.2)
BUN: 28 mg/dL — ABNORMAL HIGH (ref 6–20)
CALCIUM: 8.5 mg/dL — AB (ref 8.9–10.3)
CO2: 24 mmol/L (ref 22–32)
CREATININE: 5.28 mg/dL — AB (ref 0.61–1.24)
Chloride: 101 mmol/L (ref 101–111)
GFR calc non Af Amer: 12 mL/min — ABNORMAL LOW (ref >60)
GFR, EST AFRICAN AMERICAN: 14 mL/min — AB (ref >60)
GLUCOSE: 151 mg/dL — AB (ref 65–99)
Potassium: 2.8 mmol/L — ABNORMAL LOW (ref 3.5–5.1)
Sodium: 138 mmol/L (ref 135–145)
TOTAL PROTEIN: 6.5 g/dL (ref 6.5–8.1)

## 2017-04-23 LAB — EPSTEIN BARR VRS(EBV DNA BY PCR)
EBV DNA QN BY PCR: 730 {copies}/mL
log10 EBV DNA Qn PCR: 2.863 log10 copy/mL

## 2017-04-23 LAB — GLUCOSE, CAPILLARY
GLUCOSE-CAPILLARY: 189 mg/dL — AB (ref 65–99)
Glucose-Capillary: 158 mg/dL — ABNORMAL HIGH (ref 65–99)
Glucose-Capillary: 254 mg/dL — ABNORMAL HIGH (ref 65–99)
Glucose-Capillary: 271 mg/dL — ABNORMAL HIGH (ref 65–99)

## 2017-04-23 LAB — TSH: TSH: 0.797 u[IU]/mL (ref 0.350–4.500)

## 2017-04-23 LAB — CBC
HCT: 24.1 % — ABNORMAL LOW (ref 39.0–52.0)
HEMOGLOBIN: 7.8 g/dL — AB (ref 13.0–17.0)
MCH: 31.7 pg (ref 26.0–34.0)
MCHC: 32.4 g/dL (ref 30.0–36.0)
MCV: 98 fL (ref 78.0–100.0)
Platelets: 160 10*3/uL (ref 150–400)
RBC: 2.46 MIL/uL — AB (ref 4.22–5.81)
RDW: 14.7 % (ref 11.5–15.5)
WBC: 7.9 10*3/uL (ref 4.0–10.5)

## 2017-04-23 LAB — IRON AND TIBC
Iron: 37 ug/dL — ABNORMAL LOW (ref 45–182)
Saturation Ratios: 17 % — ABNORMAL LOW (ref 17.9–39.5)
TIBC: 214 ug/dL — ABNORMAL LOW (ref 250–450)
UIBC: 177 ug/dL

## 2017-04-23 LAB — CMV DNA, QUANTITATIVE, PCR
CMV DNA QUANT: NEGATIVE [IU]/mL
LOG10 CMV QN DNA PL: UNDETERMINED {Log_IU}/mL

## 2017-04-23 LAB — RSV(RESPIRATORY SYNCYTIAL VIRUS) AB, BLOOD: RSV AB: NEGATIVE

## 2017-04-23 LAB — FERRITIN: FERRITIN: 685 ng/mL — AB (ref 24–336)

## 2017-04-23 MED ORDER — POTASSIUM CHLORIDE CRYS ER 20 MEQ PO TBCR
40.0000 meq | EXTENDED_RELEASE_TABLET | ORAL | Status: AC
  Administered 2017-04-23 (×2): 40 meq via ORAL
  Filled 2017-04-23 (×2): qty 2

## 2017-04-23 MED ORDER — CEFAZOLIN SODIUM-DEXTROSE 1-4 GM/50ML-% IV SOLN: 1.0000 g | INTRAVENOUS | Status: DC

## 2017-04-23 MED ORDER — CEFAZOLIN SODIUM-DEXTROSE 2-4 GM/100ML-% IV SOLN
2.0000 g | INTRAVENOUS | Status: AC
  Administered 2017-04-24: 2 g via INTRAVENOUS
  Filled 2017-04-23 (×2): qty 100

## 2017-04-23 NOTE — Progress Notes (Signed)
OT  Note  Patient Details Name: KOLLIN UDELL MRN: [id number] DOB: 12/25/1970      Reason Eval/Treat Not Completed: OT screened, no needs identified, will sign off.     Gypsy Decant, MS, OTR/L 04/23/2017, 3:22 PM

## 2017-04-23 NOTE — Progress Notes (Signed)
PROGRESS NOTE  James [name] [facility] KZS:[id number] DOB: 1971-01-12 DOA: 04/19/2017 PCP: Patient, No Pcp Per   LOS: 4 days   Brief Narrative / Interim history: 46 year old male with medical history significant for HTN, Diabetes, and renal failure s/p renal transplant in 7322 which was complicated by EBV-associated lymphoproliferative disorder B-Cell lymphoma.  This required cessation of his immunosuppressive regimen and ultimately led to failure of his transplant. About one week ago the patient became nauseas and was unable to take his PO medications. He became febrile 2 days before admission and the next day he developed confusion, tachypnea, dyspnea, and some right sided chest discomfort.    In ED he was found to have acute renal failure, anion gap metabolic acidosis, and respiratory distress. CXR revealed right upper lobe pneumonia. He was admitted to ICU with sepsis, acute on chronic renal failure and right upper lobe pneumonia with evolving respiratory failure.  Assessment & Plan: Active Problems:   HCAP (healthcare-associated pneumonia)   Transaminitis   Hypokalemia   1. Sepsis due to Right upper lobe healthcare-associated pneumonia - Patient continues to improve clinically. - Blood cultures with no growth in 3 days; urine culture negative; MRSA screen negative - Continue IV Zosyn  2. 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  - Vascular surgery consulted. - Continue Prednisone  3. Insulin Dependent Diabetes - Started Levemir yesterday in addition to Novolog sliding scale - Last 3 CBG levels: 155, 219, 158 - Continue current insulin regimen   4. Hypertension - Stable with current regimen at this time - Continue amlodipine, doxazosin, lopressor  5. Encephalopathy likely due  to uremia - AMS has resolved, continue to monitor  6. Hypokalemia - Potassium 2.8 today, mildly improved from 2.5 yesterday - Replete and recheck CMP tomorrow; consider repletion while in hemodialysis vs PO   7. Transaminitis of unknown etiology - LFTs are tending down but still have mild elevation - Abdomen US revealed hepatic steatosis and prior cholecystectomy - Continue to monitor with CMP   DVT prophylaxis: Subcutaneous heparin Code Status: Full Family Communication: none at bedside Disposition Plan: home  Consultants:   Nephrology  Vascular Surgery  Procedures:   HD: 9/28, 9/30   Antimicrobials:  IV Zosyn  Vancomycin discontinued 10/1   Subjective: Patient reports he is feeling pretty good overall.  He is still have a little bit of a cough but this has improved. He denies shortness of breath. He also denies nausea, vomiting, chest pain and swelling in the lower extremities.   Objective: Vitals:   04/22/17 1706 04/22/17 2150 04/23/17 0940 04/23/17 0949  BP: 106/64 126/73  120/62  Pulse: 92 (!) 102 (!) 107 (!) 110  Resp: 20 20 14    Temp: 98.2 F (36.8 C) 98.7 F (37.1 C) 98.4 F (36.9 C)   TempSrc: Oral Oral Oral   SpO2: 99% 98% 99%   Weight: 86.8 kg (191 lb 5.8 oz) 86.6 kg (191 lb)    Height:        Intake/Output Summary (Last 24 hours) at 04/23/17 1056 Last data filed at 04/23/17 1056  Gross per 24 hour  Intake              720 ml  Output              835  ml  Net             -115 ml   Filed Weights   04/22/17 1334 04/22/17 1706 04/22/17 2150  Weight: 87.1 kg (192 lb 1 oz) 86.8 kg (191 lb 5.8 oz) 86.6 kg (191 lb)    Examination:  Constitutional: Lying supine in bed comfortably Eyes: pupils equal and round, no scleral icterus, no erythema or discharge ENMT: Moist mucous membranes Respiratory: clear to auscultation bilaterally; no wheezing, rales or rhonchi appreciated. No respiratory distress Cardiovascular: regular rhythm, tachycardia; no  lower extremity edema Abdomen: bowel sounds positive, no tenderness Skin: no rashes, lesions, ulcers. No induration Psychiatric: alert and oriented X3, normal mood, cooperative   Data Reviewed: I have independently reviewed following labs and imaging studies   CBC:  Recent Labs Lab 04/19/17 1401 04/19/17 1415 04/19/17 1900 04/20/17 0543 04/21/17 1231 04/23/17 0756  WBC 11.3*  --  10.9* 7.6 7.9 7.9  NEUTROABS 9.5*  --   --   --   --   --   HGB 9.6* 9.9* 7.5* 7.1* 7.8* 7.8*  HCT 27.4* 29.0* 22.5* 21.6* 22.6* 24.1*  MCV 93.2  --  93.8 94.3 92.2 98.0  PLT 127*  --  97* 99* 140* 621   Basic Metabolic Panel:  Recent Labs Lab 04/19/17 1900 04/19/17 1940 04/20/17 0543 04/20/17 0544 04/21/17 1231 04/22/17 0314 04/23/17 0756  NA 135 135 138 138 133* 139 138  K 3.5 3.5 4.0 4.0 2.8* 2.5* 2.8*  CL 112* 112* 112* 111 97* 104 101  CO2 13* 11* 15* 17* 24 25 24   GLUCOSE 164* 162* 192* 187* 368* 184* 151*  BUN 54* 54* 55* 55* 31* 41* 28*  CREATININE 11.37* 11.44* 11.78* 11.42* 5.26* 6.31* 5.28*  CALCIUM 7.8* 7.8* 8.1* 8.1* 8.4* 8.5* 8.5*  PHOS 6.3* 6.4*  --  6.8* 4.0  --   --    GFR: Estimated Creatinine Clearance: 18.8 mL/min (A) (by C-G formula based on SCr of 5.28 mg/dL (H)). Liver Function Tests:  Recent Labs Lab 04/19/17 1401  04/20/17 0543 04/20/17 0544 04/21/17 1231 04/22/17 0314 04/23/17 0756  AST 179*  --  339*  --  176* 129* 80*  ALT 92*  --  173*  --  183* 149* 121*  ALKPHOS 99  --  82  --  86 72 75  BILITOT 1.3*  --  2.0*  --  0.9 0.9 0.9  PROT 8.0  --  6.7  --  7.3 6.5 6.5  ALBUMIN 2.5*  < > 1.9* 1.9* 2.0* 1.9* 2.1*  < > = values in this interval not displayed.  Recent Labs Lab 04/19/17 1401  LIPASE 45   No results for input(s): AMMONIA in the last 168 hours. Coagulation Profile:  Recent Labs Lab 04/19/17 1401  INR 1.03   Cardiac Enzymes: No results for input(s): CKTOTAL, CKMB, CKMBINDEX, TROPONINI in the last 168 hours. BNP (last 3  results) No results for input(s): PROBNP in the last 8760 hours. HbA1C: No results for input(s): HGBA1C in the last 72 hours. CBG:  Recent Labs Lab 04/22/17 0753 04/22/17 1159 04/22/17 1832 04/22/17 2144 04/23/17 0831  GLUCAP 147* 204* 155* 219* 158*   Lipid Profile: No results for input(s): CHOL, HDL, LDLCALC, TRIG, CHOLHDL, LDLDIRECT in the last 72 hours. Thyroid Function Tests: No results for input(s): TSH, T4TOTAL, FREET4, T3FREE, THYROIDAB in the last 72 hours. Anemia Panel:  Recent Labs  04/23/17 0756  FERRITIN 685*  TIBC 214*  IRON 37*  Urine analysis:    Component Value Date/Time   COLORURINE YELLOW 04/20/2017 1912   APPEARANCEUR CLEAR 04/20/2017 1912   LABSPEC 1.014 04/20/2017 1912   PHURINE 6.0 04/20/2017 1912   GLUCOSEU 150 (A) 04/20/2017 1912   HGBUR LARGE (A) 04/20/2017 1912   BILIRUBINUR NEGATIVE 04/20/2017 1912   KETONESUR 5 (A) 04/20/2017 1912   PROTEINUR >=300 (A) 04/20/2017 1912   UROBILINOGEN 0.2 04/09/2014 2013   NITRITE NEGATIVE 04/20/2017 1912   LEUKOCYTESUR NEGATIVE 04/20/2017 1912   Sepsis Labs: Invalid input(s): PROCALCITONIN, LACTICIDVEN  Recent Results (from the past 240 hour(s))  Blood Culture (routine x 2)     Status: None (Preliminary result)   Collection Time: 04/19/17  2:12 PM  Result Value Ref Range Status   Specimen Description BLOOD RIGHT ANTECUBITAL  Final   Special Requests   Final    BOTTLES DRAWN AEROBIC AND ANAEROBIC Blood Culture adequate volume   Culture NO GROWTH 3 DAYS  Final   Report Status PENDING  Incomplete  Blood Culture (routine x 2)     Status: None (Preliminary result)   Collection Time: 04/19/17  2:30 PM  Result Value Ref Range Status   Specimen Description BLOOD LEFT WRIST  Final   Special Requests   Final    BOTTLES DRAWN AEROBIC AND ANAEROBIC Blood Culture adequate volume   Culture NO GROWTH 3 DAYS  Final   Report Status PENDING  Incomplete  MRSA PCR Screening     Status: None   Collection Time:  04/19/17  6:28 PM  Result Value Ref Range Status   MRSA by PCR NEGATIVE NEGATIVE Final    Comment:        The GeneXpert MRSA Assay (FDA approved for NASAL specimens only), is one component of a comprehensive MRSA colonization surveillance program. It is not intended to diagnose MRSA infection nor to guide or monitor treatment for MRSA infections.   Urine culture     Status: None   Collection Time: 04/20/17  7:11 PM  Result Value Ref Range Status   Specimen Description URINE, RANDOM  Final   Special Requests NONE  Final   Culture NO GROWTH  Final   Report Status 04/21/2017 FINAL  Final      Radiology Studies: No results found.   Scheduled Meds: . amLODipine  5 mg Oral Q24H  . darbepoetin (ARANESP) injection - DIALYSIS  40 mcg Intravenous Q Mon-HD  . doxazosin  2 mg Oral QHS  . heparin  5,000 Units Subcutaneous Q8H  . Influenza vac split quadrivalent PF  0.5 mL Intramuscular Tomorrow-1000  . insulin aspart  0-5 Units Subcutaneous QHS  . insulin aspart  0-9 Units Subcutaneous TID WC  . insulin detemir  15 Units Subcutaneous Daily  . latanoprost  1 drop Both Eyes QHS  . metoprolol tartrate  25 mg Oral BID  . predniSONE  5 mg Oral Q breakfast   Continuous Infusions: . sodium chloride    . sodium chloride    . sodium chloride    . ganciclovir (CYTOVENE) IV 55 mg (04/22/17 2125)  . piperacillin-tazobactam (ZOSYN)  IV 3.375 g (04/23/17 0944)      Arlana Lindau, PA-Student [facility] 04/23/2017, 10:56 AM

## 2017-04-23 NOTE — Care Management Important Message (Signed)
Important Message  Patient Details  Name: James [name] MRN: [id number] Date of Birth: 11-Jan-1971   Medicare Important Message Given:  Yes    [name] May 04/23/2017, 9:32 AM

## 2017-04-23 NOTE — Progress Notes (Signed)
Patient ID: Terrius Gentile [facility], male   DOB: [birth year], 46 y.o.   MRN: [id number]  [facility] Progress Note    Subjective:   Stable.    Objective:   BP 120/62   Pulse (!) 110   Temp 98.4 F (36.9 C) (Oral)   Resp 14   Ht 5\' 7"  (1.702 m)   Wt 86.6 kg (191 lb)   SpO2 99%   BMI 29.91 kg/m   Intake/Output: I/O last 3 completed shifts: In: 1010 [P.O.:960; IV Piggyback:50] Out: 835 [Other:835]   Intake/Output this shift:  Total I/O In: 480 [P.O.:480] Out: 0  Weight change: -0.38 kg (-13.4 oz)  Physical Exam: Gen:NAD CVS:no rub Resp:cta OBS:JGGEZM Ext:no edema, clotted LUE AVF  Labs: BMET  Recent Labs Lab 04/19/17 1900 04/19/17 1940 04/20/17 0543 04/20/17 0544 04/21/17 1231 04/22/17 0314 04/23/17 0756  NA 135 135 138 138 133* 139 138  K 3.5 3.5 4.0 4.0 2.8* 2.5* 2.8*  CL 112* 112* 112* 111 97* 104 101  CO2 13* 11* 15* 17* 24 25 24   GLUCOSE 164* 162* 192* 187* 368* 184* 151*  BUN 54* 54* 55* 55* 31* 41* 28*  CREATININE 11.37* 11.44* 11.78* 11.42* 5.26* 6.31* 5.28*  ALBUMIN 2.1* 2.1* 1.9* 1.9* 2.0* 1.9* 2.1*  CALCIUM 7.8* 7.8* 8.1* 8.1* 8.4* 8.5* 8.5*  PHOS 6.3* 6.4*  --  6.8* 4.0  --   --    CBC  Recent Labs Lab 04/19/17 1401  04/19/17 1900 04/20/17 0543 04/21/17 1231 04/23/17 0756  WBC 11.3*  --  10.9* 7.6 7.9 7.9  NEUTROABS 9.5*  --   --   --   --   --   HGB 9.6*  < > 7.5* 7.1* 7.8* 7.8*  HCT 27.4*  < > 22.5* 21.6* 22.6* 24.1*  MCV 93.2  --  93.8 94.3 92.2 98.0  PLT 127*  --  97* 99* 140* 160  < > = values in this interval not displayed.  @IMGRELPRIORS @ Medications:    . amLODipine  5 mg Oral Q24H  . darbepoetin (ARANESP) injection - DIALYSIS  40 mcg Intravenous Q Mon-HD  . doxazosin  2 mg Oral QHS  . heparin  5,000 Units Subcutaneous Q8H  . insulin aspart  0-5 Units Subcutaneous QHS  . insulin aspart  0-9 Units Subcutaneous TID WC  . insulin detemir  15 Units Subcutaneous Daily  . latanoprost  1 drop Both Eyes QHS  .  metoprolol tartrate  25 mg Oral BID  . predniSONE  5 mg Oral Q breakfast     Assessment/ Plan:   1.  CKD stage 5/ failed renal Tx - now back on HD w temp cath.  VVS consulted for tunneled HD catheter.  Does not need AVF now, Dr Joesph July will arrange for this later to give some time after the febrile illness.  He has been accepted for dialysis at [facility] on [address] in [place] on MWF 1st shift schedule.  His nephrologist will be Dr Christella Hartigan. Anticipate ready for dc after tunneled cath is in place. HD tomorrow.  2.  RUL PNA - per primary team 3.  HTN - stable 4.  AMS - resolved, due to uremia 5.  Anemia - started esa 6.  Dispo - as above.    Kelly Splinter MD [employer] pgr [phone]   04/23/2017, 1:34 PM

## 2017-04-23 NOTE — Evaluation (Signed)
Physical Therapy Evaluation Patient Details Name: DUGLAS HEIER MRN: [id number] DOB: [birth year] Today's Date: 04/23/2017   History of Present Illness  46 year old male with history of diabetes, hypertension, tobacco abuse, GERD, ESRD, chronic renal transplant rejection; admitted with sepsis due to R upper lobe pneumonia  Clinical Impression   Pt admitted with above diagnosis. Pt currently with functional limitations due to the deficits listed below (see PT Problem List). Overall, Mr. Newey is moving well in his room; presents with decr endurance; Session conducted on room air, Mr. Marmolejos' O2 sats remained greater tahn or equal to 97%; HR incr to 130 bpm observed highest;  Will pick up on PT caseload mainly for encouraging activity and stair training (flight of steps to access bedroom and bathroom);  Pt will benefit from skilled PT to increase their independence and safety with mobility to allow discharge to the venue listed below.       Follow Up Recommendations No PT follow up    Equipment Recommendations  None recommended by PT (may consider cane)    Recommendations for Other Services       Precautions / Restrictions        Mobility  Bed Mobility Overal bed mobility: Independent                Transfers Overall transfer level: Independent Equipment used: None             General transfer comment: No difficulty  Ambulation/Gait Ambulation/Gait assistance: Supervision Ambulation Distance (Feet): 150 Feet Assistive device:  (pushign IV pole) Gait Pattern/deviations: Step-through pattern;Decreased step length - right;Decreased step length - left;Decreased stride length     General Gait Details: Cues to self-monitor for activity tolerance; Overall moving well, slightly fatigued with reported moderate dizziness at end of walk  Stairs            Wheelchair Mobility    Modified Rankin (Stroke Patients Only)       Balance                                             Pertinent Vitals/Pain Pain Assessment: No/denies pain ("not really")    Home Living Family/patient expects to be discharged to:: Private residence Living Arrangements: Spouse/significant other Available Help at Discharge: Family;Available PRN/intermittently Type of Home: Apartment Home Access: Stairs to enter     Home Layout: Two level;Bed/bath upstairs Home Equipment: None      Prior Function Level of Independence: Independent         Comments: has enjoyed paintball recently     Hand Dominance        Extremity/Trunk Assessment   Upper Extremity Assessment Upper Extremity Assessment: Overall WFL for tasks assessed    Lower Extremity Assessment Lower Extremity Assessment: Generalized weakness (Fatigues with incr amb distance)       Communication   Communication: No difficulties  Cognition Arousal/Alertness: Awake/alert Behavior During Therapy: WFL for tasks assessed/performed Overall Cognitive Status: Within Functional Limits for tasks assessed                                        General Comments General comments (skin integrity, edema, etc.): Session conducted on room air, Mr. Bordas' O2 sats remained greater tahn or equal to 97%; HR incr to 130 bpm  observed highest    Exercises     Assessment/Plan    PT Assessment Patient needs continued PT services  PT Problem List Decreased strength;Decreased activity tolerance;Decreased mobility;Decreased knowledge of use of DME;Cardiopulmonary status limiting activity       PT Treatment Interventions DME instruction;Gait training;Stair training;Functional mobility training;Therapeutic activities;Therapeutic exercise;Balance training;Neuromuscular re-education;Patient/family education    PT Goals (Current goals can be found in the Care Plan section)  Acute Rehab PT Goals Patient Stated Goal: Hopes to be able to be home soon -- he says maybe in 2 days? PT Goal  Formulation: With patient Time For Goal Achievement: 05/07/17 Potential to Achieve Goals: Good    Frequency Min 3X/week   Barriers to discharge Inaccessible home environment;Other (comment) Flight of stairs to reach bathroom/bedroom    Co-evaluation               AM-PAC PT "6 Clicks" Daily Activity  Outcome Measure Difficulty turning over in bed (including adjusting bedclothes, sheets and blankets)?: None Difficulty moving from lying on back to sitting on the side of the bed? : None Difficulty sitting down on and standing up from a chair with arms (e.g., wheelchair, bedside commode, etc,.)?: None Help needed moving to and from a bed to chair (including a wheelchair)?: None Help needed walking in hospital room?: None Help needed climbing 3-5 steps with a railing? : A Lot 6 Click Score: 22    End of Session Equipment Utilized During Treatment: Other (comment) (O2 sat monitor) Activity Tolerance: Patient tolerated treatment well Patient left: in chair;with call bell/phone within reach Nurse Communication: Mobility status PT Visit Diagnosis: Other abnormalities of gait and mobility (R26.89);Dizziness and giddiness (R42)    Time: 8366-2947 PT Time Calculation (min) (ACUTE ONLY): 25 min   Charges:   PT Evaluation $PT Eval Low Complexity: 1 Low PT Treatments $Gait Training: 8-22 mins   PT G Codes:        Roney Marion, PT  Acute Rehabilitation Services Pager [phone] Office [phone]   [place]letta [place] 04/23/2017, 2:53 PM

## 2017-04-23 NOTE — Consult Note (Signed)
[facility] NOTE   MRN : [id number]  Reason for Consult: ESRD failed kidney transplant Referring Physician: Dr. 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 He has a temp catheter and we have been asked to provide permanent access and tunneled HD catheter.  Past medical history: hypertension, diabetes.    Proximally 5 days ago he developed nausea and was unable to take his oral medications. For the last 2 days he has been febrile. On the day of admission he began to exhibit confusion and tachypnea with dyspnea. He also complained of some right-sided chest discomfort. He was brought to the emergency Department for evaluation on 9/28 and was found to have acute renal failure with an anion gap metabolic acidosis, respiratory distress in the setting of a right upper lobe pneumonia on chest x-ray. He was admitted to the ICU with severe sepsis, acute on chronic renal failure, right upper lobe pneumonia and evolving respiratory failure.  Broad spectrum antibiotics, vancomycin and Zosyn pending culture data.        Current Facility-Administered Medications  Medication Dose Route Frequency Provider Last Rate Last Dose  . 0.9 %  sodium chloride infusion  250 mL Intravenous PRN Collene Gobble, MD      . 0.9 %  sodium chloride infusion  100 mL Intravenous PRN Donato Heinz, MD      . 0.9 %  sodium chloride infusion  100 mL Intravenous PRN Donato Heinz, MD      . albuterol (PROVENTIL) (2.5 MG/3ML) 0.083% nebulizer solution 2.5 mg  2.5 mg Nebulization Q4H PRN Collene Gobble, MD      . alteplase (CATHFLO ACTIVASE) injection 2 mg  2 mg Intracatheter Once PRN Donato Heinz, MD      . amLODipine (NORVASC) tablet 5 mg  5 mg Oral Q24H Collene Gobble, MD   5 mg at 04/22/17 2300  . Darbepoetin Alfa (ARANESP) injection 40 mcg  40 mcg Intravenous Q Mon-HD Donato Heinz, MD   40 mcg at 04/22/17 1710  .  doxazosin (CARDURA) tablet 2 mg  2 mg Oral QHS Donato Heinz, MD   2 mg at 04/22/17 2301  . ganciclovir (CYTOVENE) 55 mg in sodium chloride 0.9 % 100 mL IVPB  0.625 mg/kg Intravenous Q M,W,F-HD Donato Heinz, MD 100 mL/hr at 04/22/17 2125 55 mg at 04/22/17 2125  . heparin injection 1,000 Units  1,000 Units Dialysis PRN Donato Heinz, MD      . heparin injection 1,700 Units  20 Units/kg Dialysis PRN Donato Heinz, MD      . heparin injection 1,900 Units  20 Units/kg Dialysis PRN Donato Heinz, MD   1,900 Units at 04/20/17 0455  . heparin injection 5,000 Units  5,000 Units Subcutaneous Q8H Collene Gobble, MD   5,000 Units at 04/22/17 2304  . Influenza vac split quadrivalent PF (FLUARIX) injection 0.5 mL  0.5 mL Intramuscular Tomorrow-1000 Rosita Fire, MD      . insulin aspart (novoLOG) injection 0-5 Units  0-5 Units Subcutaneous QHS Collene Gobble, MD   3 Units at 04/21/17 2200  . insulin aspart (novoLOG) injection 0-9 Units  0-9 Units Subcutaneous TID WC Collene Gobble, MD   2 Units at 04/23/17 [phone]  . insulin detemir (LEVEMIR) injection 15 Units  15 Units Subcutaneous Daily Rosita Fire, MD   15 Units at 04/23/17 [phone]  . latanoprost (XALATAN) 0.005 % ophthalmic solution 1 drop  1 drop Both Eyes QHS Collene Gobble, MD   1 drop at 04/22/17 2304  . lidocaine (PF) (XYLOCAINE) 1 % injection 5 mL  5 mL Intradermal PRN Donato Heinz, MD      . lidocaine-prilocaine (EMLA) cream 1 application  1 application Topical PRN Donato Heinz, MD      . metoprolol tartrate (LOPRESSOR) tablet 25 mg  25 mg Oral BID Donato Heinz, MD   25 mg at 04/23/17 0949  . ondansetron (ZOFRAN) injection 4 mg  4 mg Intravenous Q6H PRN Collene Gobble, MD      . pentafluoroprop-tetrafluoroeth (GEBAUERS) aerosol 1 application  1 application Topical PRN Donato Heinz, MD      . piperacillin-tazobactam (ZOSYN) IVPB 3.375 g  3.375 g Intravenous Q12H Wynell Balloon, RPH  12.5 mL/hr at 04/23/17 0944 3.375 g at 04/23/17 0944  . predniSONE (DELTASONE) tablet 5 mg  5 mg Oral Q breakfast Collene Gobble, MD   5 mg at 04/23/17 0935    Pt meds include: Statin :No Betablocker: Yes ASA: No Other anticoagulants/antiplatelets: none  Past Medical History:  Diagnosis Date  . Diabetes mellitus without complication ([place])   . Hypertension   . Pneumonia 04/21/2017  . Renal disorder   . 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 He then got HD in [facility] until getting a renal transplant in 2009 at [place], [place].  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    . Smoker   . Transplant recipient     Past Surgical History:  Procedure Laterality Date  . CHOLECYSTECTOMY    . HIP SURGERY    . KIDNEY TRANSPLANT      Social History Social History  Substance Use Topics  . Smoking status: Former Smoker    Packs/day: 0.50  . Smokeless tobacco: Never Used  . Alcohol use No    Family History Family History  Problem Relation Age of Onset  . Adopted: Yes    Allergies  Allergen Reactions  . Darvon [Propoxyphene] Rash    Broke out on eyelid   . Tramadol Rash     REVIEW OF SYSTEMS  General: [ ]  Weight loss, [x ] Fever, [ ]  chills Neurologic: [ ]  Dizziness, [ ]  Blackouts, [ ]  Seizure [ ]  Stroke, [ ]  "Mini stroke", [ ]  Slurred speech, [ ]  Temporary blindness; [ ]  weakness in arms or legs, [ ]  Hoarseness [ ]  Dysphagia Cardiac: [x ] Chest pain/pressure, [ ]  Shortness of breath at rest [ ]  Shortness of breath with exertion, [ ]  Atrial fibrillation or irregular heartbeat  Vascular: [ ]  Pain in legs with walking, [ ]  Pain in legs at rest, [ ]  Pain in legs at night,  [ ]  Non-healing ulcer, [ ]  Blood clot in vein/DVT,    Pulmonary: [ ]  Home oxygen, [ ]  Productive cough, [ ]  Coughing up blood, [ ]  Asthma,  [ ]  Wheezing [ ]  COPD Musculoskeletal:  [ ]  Arthritis, [ ]  Low back pain, [ ]  Joint pain Hematologic: [ ]  Easy Bruising, [ ]  Anemia; [ ]  Hepatitis Gastrointestinal: [ ]  Blood in stool, [ ]  Gastroesophageal Reflux/heartburn, Urinary: [x ] chronic Kidney  disease, [x ] on HD - [ ]  MWF or [ ]  TTHS, [ ]  Burning with urination, [ ]  Difficulty urinating Skin: [ ]  Rashes, [ ]  Wounds Psychological: [ ]  Anxiety, [ ]  Depression  Physical Examination Vitals:   04/22/17 1706 04/22/17 2150 04/23/17 0940 04/23/17 0949  BP: 106/64 126/73  120/62  Pulse: 92 (!) 102 (!) 107 (!) 110  Resp: 20 20 14    Temp: 98.2 F (36.8 C) 98.7 F (37.1 C) 98.4 F (36.9 C)   TempSrc: Oral Oral Oral   SpO2: 99% 98% 99%   Weight: 191 lb 5.8 oz (86.8 kg) 191 lb (86.6 kg)    Height:       Body mass index is 29.91 kg/m.  General:  WDWN in NAD Gait: Normal HENT: WNL Eyes: Pupils equal Pulmonary: normal non-labored breathing , without Rales, rhonchi,  wheezing Cardiac: RRR, without  Murmurs, rubs or gallops; No carotid bruits Abdomen: soft, NT, no masses Skin: no rashes, ulcers noted;  no Gangrene , no cellulitis; no open wounds;   Vascular Exam/Pulses:palpable  Radial/brachial pulses B UE.  Left UE old thrombosed fistula.     Musculoskeletal: Positive B hand  muscle wasting/ atrophy.  He denise problems with function and is right hand dominant. No edema  Neurologic: A&O X 3; Appropriate Affect ;  SENSATION: normal; MOTOR FUNCTION: 5/5 Symmetric Speech is fluent/normal   Significant Diagnostic Studies: CBC Lab Results  Component Value Date   WBC 7.9 04/23/2017   HGB 7.8 (L) 04/23/2017   HCT 24.1 (L) 04/23/2017   MCV 98.0 04/23/2017   PLT 160 04/23/2017    BMET    Component Value Date/Time   NA 138 04/23/2017 0756   K 2.8 (L) 04/23/2017 0756   CL 101 04/23/2017 0756   CO2 24 04/23/2017 0756   GLUCOSE 151 (H)  04/23/2017 0756   BUN 28 (H) 04/23/2017 0756   CREATININE 5.28 (H) 04/23/2017 0756   CALCIUM 8.5 (L) 04/23/2017 0756   GFRNONAA 12 (L) 04/23/2017 0756   GFRAA 14 (L) 04/23/2017 0756   Estimated Creatinine Clearance: 18.8 mL/min (A) (by C-G formula based on SCr of 5.28 mg/dL (H)).  COAG Lab Results  Component Value Date   INR 1.03 04/19/2017     Non-Invasive Vascular Imaging: Pending B UE vein mapping  ASSESSMENT/PLAN:  ESRD s/p kidney transplant failure Negative blood cultures to date, on IV antibiotics for possible  Sepsis due to Right upper lobe healthcare associated pneumonia  Pending vein mapping we will plan left basilic fistula creation if possible other wise we will look at the right arm for access.  We will place Tunneled catheter only tomorrow by Dr. Trula Slade after talking to Nephrology.   Laurence Slate [facility] 04/23/2017 10:35 AM   I agree with the above.  I have seen and examined the patient.  I spoke with Dr. Melvia Heaps and we will plan on placement of a [facility] on Thursday.  Fistula will de deferred to his physicians in [facility].  Annamarie Major

## 2017-04-24 ENCOUNTER — Encounter (HOSPITAL_COMMUNITY)
Admission: EM | Disposition: A | Discharge status: Home Health | Payer: Self-pay | Source: Home / Self Care | Attending: Emergency Medicine

## 2017-04-24 ENCOUNTER — Inpatient Hospital Stay (HOSPITAL_COMMUNITY): Payer: Medicare Other | Admitting: Certified Registered"

## 2017-04-24 ENCOUNTER — Inpatient Hospital Stay (HOSPITAL_COMMUNITY): Payer: Medicare Other

## 2017-04-24 ENCOUNTER — Encounter (HOSPITAL_COMMUNITY): Payer: Self-pay | Admitting: Orthopedic Surgery

## 2017-04-24 ENCOUNTER — Encounter (HOSPITAL_COMMUNITY): Payer: Medicare Other

## 2017-04-24 HISTORY — PX: INSERTION OF DIALYSIS CATHETER: SHX1324

## 2017-04-24 LAB — BASIC METABOLIC PANEL
ANION GAP: 13 (ref 5–15)
BUN: 44 mg/dL — AB (ref 6–20)
CHLORIDE: 103 mmol/L (ref 101–111)
CO2: 23 mmol/L (ref 22–32)
Calcium: 8.8 mg/dL — ABNORMAL LOW (ref 8.9–10.3)
Creatinine, Ser: 7.34 mg/dL — ABNORMAL HIGH (ref 0.61–1.24)
GFR calc Af Amer: 9 mL/min — ABNORMAL LOW (ref >60)
GFR, EST NON AFRICAN AMERICAN: 8 mL/min — AB (ref >60)
GLUCOSE: 148 mg/dL — AB (ref 65–99)
POTASSIUM: 3.1 mmol/L — AB (ref 3.5–5.1)
Sodium: 139 mmol/L (ref 135–145)

## 2017-04-24 LAB — GLUCOSE, CAPILLARY
GLUCOSE-CAPILLARY: 150 mg/dL — AB (ref 65–99)
GLUCOSE-CAPILLARY: 161 mg/dL — AB (ref 65–99)
GLUCOSE-CAPILLARY: 157 mg/dL — AB (ref 65–99)
Glucose-Capillary: 241 mg/dL — ABNORMAL HIGH (ref 65–99)

## 2017-04-24 LAB — CULTURE, BLOOD (ROUTINE X 2)
Culture: NO GROWTH
Culture: NO GROWTH
Special Requests: ADEQUATE
Special Requests: ADEQUATE

## 2017-04-24 LAB — CBC
HEMATOCRIT: 23.2 % — AB (ref 39.0–52.0)
HEMOGLOBIN: 7.4 g/dL — AB (ref 13.0–17.0)
MCH: 32 pg (ref 26.0–34.0)
MCHC: 31.9 g/dL (ref 30.0–36.0)
MCV: 100.4 fL — AB (ref 78.0–100.0)
PLATELETS: 192 10*3/uL (ref 150–400)
RBC: 2.31 MIL/uL — ABNORMAL LOW (ref 4.22–5.81)
RDW: 14.8 % (ref 11.5–15.5)
WBC: 8.5 10*3/uL (ref 4.0–10.5)

## 2017-04-24 LAB — PROTIME-INR
INR: 1.07
Prothrombin Time: 13.9 seconds (ref 11.4–15.2)

## 2017-04-24 SURGERY — INSERTION OF DIALYSIS CATHETER
Anesthesia: Monitor Anesthesia Care | Site: Neck | Laterality: Left

## 2017-04-24 MED ORDER — LIDOCAINE HCL (PF) 1 % IJ SOLN: 5.0000 mL | INTRAMUSCULAR | Status: DC | PRN

## 2017-04-24 MED ORDER — PIPERACILLIN-TAZOBACTAM 3.375 G IVPB
3.3750 g | Freq: Two times a day (BID) | INTRAVENOUS | Status: DC
  Administered 2017-04-24 – 2017-04-25 (×2): 3.375 g via INTRAVENOUS
  Filled 2017-04-24 (×3): qty 50

## 2017-04-24 MED ORDER — ROCURONIUM BROMIDE 10 MG/ML (PF) SYRINGE
PREFILLED_SYRINGE | INTRAVENOUS | Status: DC | PRN
  Administered 2017-04-24: 25 mg via INTRAVENOUS

## 2017-04-24 MED ORDER — PENTAFLUOROPROP-TETRAFLUOROETH EX AERO: 1.0000 "application " | INHALATION_SPRAY | CUTANEOUS | Status: DC | PRN

## 2017-04-24 MED ORDER — SODIUM CHLORIDE 0.9 % IV SOLN: 100.0000 mL | INTRAVENOUS | Status: DC | PRN

## 2017-04-24 MED ORDER — SUGAMMADEX SODIUM 200 MG/2ML IV SOLN
INTRAVENOUS | Status: DC | PRN
  Administered 2017-04-24: 200 mg via INTRAVENOUS

## 2017-04-24 MED ORDER — HEPARIN SODIUM (PORCINE) 1000 UNIT/ML IJ SOLN
INTRAMUSCULAR | Status: DC | PRN
  Administered 2017-04-24: 3800 [IU]

## 2017-04-24 MED ORDER — FENTANYL CITRATE (PF) 250 MCG/5ML IJ SOLN
INTRAMUSCULAR | Status: DC | PRN
  Administered 2017-04-24: 100 ug via INTRAVENOUS
  Administered 2017-04-24: 50 ug via INTRAVENOUS

## 2017-04-24 MED ORDER — PROPOFOL 1000 MG/100ML IV EMUL
INTRAVENOUS | Status: AC
  Filled 2017-04-24: qty 100

## 2017-04-24 MED ORDER — ALTEPLASE 2 MG IJ SOLR: 2.0000 mg | Freq: Once | INTRAMUSCULAR | Status: DC | PRN

## 2017-04-24 MED ORDER — LIDOCAINE 2% (20 MG/ML) 5 ML SYRINGE
INTRAMUSCULAR | Status: DC | PRN
  Administered 2017-04-24: 40 mg via INTRAVENOUS

## 2017-04-24 MED ORDER — FENTANYL CITRATE (PF) 250 MCG/5ML IJ SOLN
INTRAMUSCULAR | Status: AC
  Filled 2017-04-24: qty 5

## 2017-04-24 MED ORDER — HEPARIN SODIUM (PORCINE) 1000 UNIT/ML DIALYSIS: 1000.0000 [IU] | INTRAMUSCULAR | Status: DC | PRN

## 2017-04-24 MED ORDER — DEXAMETHASONE SODIUM PHOSPHATE 10 MG/ML IJ SOLN
INTRAMUSCULAR | Status: DC | PRN
  Administered 2017-04-24: 5 mg via INTRAVENOUS

## 2017-04-24 MED ORDER — LIDOCAINE-EPINEPHRINE (PF) 1 %-1:200000 IJ SOLN
INTRAMUSCULAR | Status: AC
  Filled 2017-04-24: qty 30

## 2017-04-24 MED ORDER — SODIUM CHLORIDE 0.9 % IV SOLN
Freq: Once | INTRAVENOUS | Status: AC
  Administered 2017-04-24: 15:00:00 via INTRAVENOUS

## 2017-04-24 MED ORDER — ONDANSETRON HCL 4 MG/2ML IJ SOLN
INTRAMUSCULAR | Status: DC | PRN
  Administered 2017-04-24: 4 mg via INTRAVENOUS

## 2017-04-24 MED ORDER — PHENYLEPHRINE 40 MCG/ML (10ML) SYRINGE FOR IV PUSH (FOR BLOOD PRESSURE SUPPORT)
PREFILLED_SYRINGE | INTRAVENOUS | Status: DC | PRN
  Administered 2017-04-24: 40 ug via INTRAVENOUS
  Administered 2017-04-24: 80 ug via INTRAVENOUS

## 2017-04-24 MED ORDER — MIDAZOLAM HCL 2 MG/2ML IJ SOLN
INTRAMUSCULAR | Status: DC | PRN
  Administered 2017-04-24: 2 mg via INTRAVENOUS

## 2017-04-24 MED ORDER — SUCCINYLCHOLINE CHLORIDE 200 MG/10ML IV SOSY
PREFILLED_SYRINGE | INTRAVENOUS | Status: DC | PRN
  Administered 2017-04-24: 120 mg via INTRAVENOUS

## 2017-04-24 MED ORDER — LIDOCAINE-PRILOCAINE 2.5-2.5 % EX CREA: 1.0000 "application " | TOPICAL_CREAM | CUTANEOUS | Status: DC | PRN

## 2017-04-24 MED ORDER — PROPOFOL 10 MG/ML IV BOLUS
INTRAVENOUS | Status: DC | PRN
  Administered 2017-04-24: 150 mg via INTRAVENOUS

## 2017-04-24 MED ORDER — 0.9 % SODIUM CHLORIDE (POUR BTL) OPTIME
TOPICAL | Status: DC | PRN
  Administered 2017-04-24: 1000 mL

## 2017-04-24 MED ORDER — SODIUM CHLORIDE 0.9 % IV SOLN
INTRAVENOUS | Status: DC | PRN
  Administered 2017-04-24: 500 mL

## 2017-04-24 MED ORDER — HEPARIN SODIUM (PORCINE) 1000 UNIT/ML IJ SOLN
INTRAMUSCULAR | Status: AC
  Filled 2017-04-24: qty 1

## 2017-04-24 MED ORDER — MIDAZOLAM HCL 2 MG/2ML IJ SOLN
INTRAMUSCULAR | Status: AC
  Filled 2017-04-24: qty 2

## 2017-04-24 MED ORDER — CEFAZOLIN SODIUM-DEXTROSE 2-4 GM/100ML-% IV SOLN
INTRAVENOUS | Status: AC
  Filled 2017-04-24: qty 100

## 2017-04-24 SURGICAL SUPPLY — 37 items
BAG DECANTER FOR FLEXI CONT (MISCELLANEOUS) ×2 IMPLANT
BIOPATCH RED 1 DISK 7.0 (GAUZE/BANDAGES/DRESSINGS) ×2 IMPLANT
CATH PALINDROME RT-P 15FX19CM (CATHETERS) IMPLANT
CATH PALINDROME RT-P 15FX23CM (CATHETERS) IMPLANT
CATH PALINDROME RT-P 15FX28CM (CATHETERS) ×2 IMPLANT
CATH PALINDROME RT-P 15FX55CM (CATHETERS) IMPLANT
COVER PROBE W GEL 5X96 (DRAPES) ×2 IMPLANT
COVER SURGICAL LIGHT HANDLE (MISCELLANEOUS) ×2 IMPLANT
DERMABOND ADVANCED (GAUZE/BANDAGES/DRESSINGS) ×1
DERMABOND ADVANCED .7 DNX12 (GAUZE/BANDAGES/DRESSINGS) ×1 IMPLANT
DRAPE C-ARM 42X72 X-RAY (DRAPES) ×2 IMPLANT
DRAPE CHEST BREAST 15X10 FENES (DRAPES) ×2 IMPLANT
GAUZE SPONGE 4X4 16PLY XRAY LF (GAUZE/BANDAGES/DRESSINGS) ×2 IMPLANT
GLOVE BIOGEL PI IND STRL 7.5 (GLOVE) ×1 IMPLANT
GLOVE BIOGEL PI INDICATOR 7.5 (GLOVE) ×1
GLOVE SURG SS PI 7.5 STRL IVOR (GLOVE) ×2 IMPLANT
GOWN STRL REUS W/ TWL LRG LVL3 (GOWN DISPOSABLE) ×1 IMPLANT
GOWN STRL REUS W/ TWL XL LVL3 (GOWN DISPOSABLE) ×1 IMPLANT
GOWN STRL REUS W/TWL LRG LVL3 (GOWN DISPOSABLE) ×2
GOWN STRL REUS W/TWL XL LVL3 (GOWN DISPOSABLE) ×2
KIT BASIN OR (CUSTOM PROCEDURE TRAY) ×2 IMPLANT
KIT ROOM TURNOVER OR (KITS) ×2 IMPLANT
NEEDLE 18GX1X1/2 (RX/OR ONLY) (NEEDLE) ×2 IMPLANT
NEEDLE HYPO 25GX1X1/2 BEV (NEEDLE) ×2 IMPLANT
NS IRRIG 1000ML POUR BTL (IV SOLUTION) ×2 IMPLANT
PACK SURGICAL SETUP 50X90 (CUSTOM PROCEDURE TRAY) ×2 IMPLANT
PAD ARMBOARD 7.5X6 YLW CONV (MISCELLANEOUS) ×4 IMPLANT
SOAP 2 % CHG 4 OZ (WOUND CARE) ×2 IMPLANT
SUT ETHILON 3 0 PS 1 (SUTURE) ×2 IMPLANT
SUT VICRYL 4-0 PS2 18IN ABS (SUTURE) ×2 IMPLANT
SYR 10ML LL (SYRINGE) ×2 IMPLANT
SYR 20CC LL (SYRINGE) ×4 IMPLANT
SYR 5ML LL (SYRINGE) ×2 IMPLANT
SYR CONTROL 10ML LL (SYRINGE) ×2 IMPLANT
TOWEL GREEN STERILE (TOWEL DISPOSABLE) ×2 IMPLANT
TOWEL GREEN STERILE FF (TOWEL DISPOSABLE) ×2 IMPLANT
WATER STERILE IRR 1000ML POUR (IV SOLUTION) ×2 IMPLANT

## 2017-04-24 NOTE — Anesthesia Procedure Notes (Signed)
Procedure Name: Intubation Date/Time: 04/24/2017 4:07 PM Performed by: Teressa Lower Pre-anesthesia Checklist: Patient identified, Emergency Drugs available, Suction available and Patient being monitored Patient Re-evaluated:Patient Re-evaluated prior to induction Oxygen Delivery Method: Circle system utilized Preoxygenation: Pre-oxygenation with 100% oxygen Induction Type: IV induction, Rapid sequence and Cricoid Pressure applied Laryngoscope Size: Mac and 3 Grade View: Grade I Tube type: Oral Tube size: 7.5 mm Number of attempts: 1 Airway Equipment and Method: Stylet and Oral airway Placement Confirmation: ETT inserted through vocal cords under direct vision,  positive ETCO2 and breath sounds checked- equal and bilateral Secured at: 22 cm Tube secured with: Tape Dental Injury: Teeth and Oropharynx as per pre-operative assessment

## 2017-04-24 NOTE — Interval H&P Note (Signed)
History and Physical Interval Note:  04/24/2017 3:41 PM  [name]  has presented today for surgery, with the diagnosis of end stage renal disease  The various methods of treatment have been discussed with the patient and family. After consideration of risks, benefits and other options for treatment, the patient has consented to  Procedure(s): INSERTION OF DIALYSIS CATHETER (N/A) as a surgical intervention .  The patient's history has been reviewed, patient examined, no change in status, stable for surgery.  I have reviewed the patient's chart and labs.  Questions were answered to the patient's satisfaction.     James [name]

## 2017-04-24 NOTE — Op Note (Signed)
    OPERATIVE REPORT  DATE OF SURGERY: 04/24/2017  PATIENT: James [name], 46 y.o. male MRN: [id number]  DOB: Feb 04, 1971  PRE-OPERATIVE DIAGNOSIS: End-stage renal disease  POST-OPERATIVE DIAGNOSIS:  Same  PROCEDURE: Left IJ tunneled hemodialysis catheter  SURGEON:  Curt Jews, M.D.  PHYSICIAN ASSISTANT: Nurse  ANESTHESIA:  Gen.  EBL: 5 ml  Total I/O In: 500 [I.V.:500] Out: 5 [Blood:5]  BLOOD ADMINISTERED: None  DRAINS: None  SPECIMEN: None  COUNTS CORRECT:  YES  PLAN OF CARE: PACU with chest x-ray pending   PATIENT DISPOSITION:  PACU - hemodynamically stable  PROCEDURE DETAILS: The patient was taken to the operating room placed supine position where general anesthesia was administered. SonoSite visualization of the right internal jugular vein revealed no evidence of flow in the right internal jugular vein. The patient had a temporary catheter left internal jugular vein. For this reason decision was made to exchange to a tunneled catheter. The left neck and chest and catheter were prepped and draped in the usual sterile fashion. The catheter was transected and the guidewire was passed through the existing catheter and existing catheter was removed in its entirety. A guidewire was in the right atrium and this was confirmed with fluoroscopy. A dilator and peel-away sheath was passed over the guidewire and the dilator and guidewire removed. A 28 cm catheter was passed through the peel-away sheath with the tips being placed in the level of the distal right atrium. The cat the peel-away sheath was removed. The catheter was brought through a separate stab incision in the 2 lm ports were attached. Both lumens flushed and aspirated easily and were locked with 1000 unit per cc heparin. The catheter was secured to the skin with a 3-0 nylon stitch and the entry site was closed with a 4-0 subcuticular Vicryl stitch. Sterile dressing was applied and the patient was transferred to the  recovery room with chest x-ray pending   Rosetta Posner, M.D., [facility] 04/24/2017 4:46 PM

## 2017-04-24 NOTE — Progress Notes (Signed)
Patient states, "I am leaving here today no matter what happens!" Education provided to patient. MD notified. Will continue to monitor.

## 2017-04-24 NOTE — Anesthesia Preprocedure Evaluation (Signed)
Anesthesia Evaluation  Patient identified by MRN, date of birth, ID band Patient awake    Reviewed: Allergy & Precautions, NPO status , Patient's Chart, lab work & pertinent test results  Airway Mallampati: II  TM Distance: >3 FB Neck ROM: Full    Dental  (+) Teeth Intact, Dental Advisory Given   Pulmonary former smoker,    breath sounds clear to auscultation       Cardiovascular hypertension,  Rhythm:Regular Rate:Normal     Neuro/Psych    GI/Hepatic   Endo/Other  diabetes  Renal/GU      Musculoskeletal   Abdominal   Peds  Hematology   Anesthesia Other Findings   Reproductive/Obstetrics                             Anesthesia Physical Anesthesia Plan  ASA: III  Anesthesia Plan: MAC   Post-op Pain Management:    Induction: Intravenous  PONV Risk Score and Plan: Ondansetron and Propofol infusion  Airway Management Planned:   Additional Equipment:   Intra-op Plan:   Post-operative Plan:   Informed Consent: I have reviewed the patients History and Physical, chart, labs and discussed the procedure including the risks, benefits and alternatives for the proposed anesthesia with the patient or authorized representative who has indicated his/her understanding and acceptance.   Dental advisory given  Plan Discussed with: CRNA and Anesthesiologist  Anesthesia Plan Comments:         Anesthesia Quick Evaluation

## 2017-04-24 NOTE — Progress Notes (Signed)
Patient ID: James [name] [facility], male   DOB: [birth year], 46 y.o.   MRN: [id number]  [facility] Progress Note    Subjective:   Stable.    Objective:   BP 125/61   Pulse (!) 110   Temp 97.9 F (36.6 C) (Oral)   Resp 20   Ht 5\' 7"  (1.702 m)   Wt 85.8 kg (189 lb 2.5 oz)   SpO2 99%   BMI 29.63 kg/m   Intake/Output: I/O last 3 completed shifts: In: 1210 [P.O.:960; IV Piggyback:250] Out: 0    Intake/Output this shift:  No intake/output data recorded. Weight change:   Physical Exam: Gen:NAD CVS:no rub Resp:cta TKZ:SWFUXN Ext:no edema, clotted LUE AVF   Assessment/ Plan:   1.  CKD stage 5/ failed renal Tx - now back on HD w temp cath.  VVS consulted and will place tunneled HD cath today.  Hold off on AVF for now given recent fevers, Dr Joesph July will arrange for this at a later date. Accepted for dialysis at [facility] in [facility] on MWF 1st shift schedule.  His nephrologist will be Dr Christella Hartigan. HD today.  OK for dc after tunneled cath is placed.  2.  RUL PNA - per primary team 3.  HTN - stable 4.  AMS - resolved, due to uremia 5.  Anemia - started esa 6.  Dispo - as above.    Kelly Splinter MD [facility] pgr [phone]   04/24/2017, 10:06 AM   Labs: BMET  Recent Labs Lab 04/19/17 1900 04/19/17 1940 04/20/17 0543 04/20/17 0544 04/21/17 1231 04/22/17 0314 04/23/17 0756 04/24/17 0727  NA 135 135 138 138 133* 139 138 139  K 3.5 3.5 4.0 4.0 2.8* 2.5* 2.8* 3.1*  CL 112* 112* 112* 111 97* 104 101 103  CO2 13* 11* 15* 17* 24 25 24 23   GLUCOSE 164* 162* 192* 187* 368* 184* 151* 148*  BUN 54* 54* 55* 55* 31* 41* 28* 44*  CREATININE 11.37* 11.44* 11.78* 11.42* 5.26* 6.31* 5.28* 7.34*  ALBUMIN 2.1* 2.1* 1.9* 1.9* 2.0* 1.9* 2.1*  --   CALCIUM 7.8* 7.8* 8.1* 8.1* 8.4* 8.5* 8.5* 8.8*  PHOS 6.3* 6.4*  --  6.8* 4.0  --   --   --    CBC  Recent Labs Lab 04/19/17 1401  04/20/17 0543 04/21/17 1231 04/23/17 0756 04/24/17 0727   WBC 11.3*  < > 7.6 7.9 7.9 8.5  NEUTROABS 9.5*  --   --   --   --   --   HGB 9.6*  < > 7.1* 7.8* 7.8* 7.4*  HCT 27.4*  < > 21.6* 22.6* 24.1* 23.2*  MCV 93.2  < > 94.3 92.2 98.0 100.4*  PLT 127*  < > 99* 140* 160 192  < > = values in this interval not displayed.  @IMGRELPRIORS @ Medications:    . amLODipine  5 mg Oral Q24H  . darbepoetin (ARANESP) injection - DIALYSIS  40 mcg Intravenous Q Mon-HD  . doxazosin  2 mg Oral QHS  . heparin  5,000 Units Subcutaneous Q8H  . insulin aspart  0-5 Units Subcutaneous QHS  . insulin aspart  0-9 Units Subcutaneous TID WC  . insulin detemir  15 Units Subcutaneous Daily  . latanoprost  1 drop Both Eyes QHS  . metoprolol tartrate  25 mg Oral BID  . predniSONE  5 mg Oral Q breakfast

## 2017-04-24 NOTE — H&P (View-Only) (Signed)
[facility] NOTE   MRN : [id number]  Reason for Consult: ESRD failed kidney transplant Referring Physician: Dr. 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 He has a temp catheter and we have been asked to provide permanent access and tunneled HD catheter.  Past medical history: hypertension, diabetes.    Proximally 5 days ago he developed nausea and was unable to take his oral medications. For the last 2 days he has been febrile. On the day of admission he began to exhibit confusion and tachypnea with dyspnea. He also complained of some right-sided chest discomfort. He was brought to the emergency Department for evaluation on 9/28 and was found to have acute renal failure with an anion gap metabolic acidosis, respiratory distress in the setting of a right upper lobe pneumonia on chest x-ray. He was admitted to the ICU with severe sepsis, acute on chronic renal failure, right upper lobe pneumonia and evolving respiratory failure.  Broad spectrum antibiotics, vancomycin and Zosyn pending culture data.        Current Facility-Administered Medications  Medication Dose Route Frequency Provider Last Rate Last Dose  . 0.9 %  sodium chloride infusion  250 mL Intravenous PRN Collene Gobble, MD      . 0.9 %  sodium chloride infusion  100 mL Intravenous PRN Donato Heinz, MD      . 0.9 %  sodium chloride infusion  100 mL Intravenous PRN Donato Heinz, MD      . albuterol (PROVENTIL) (2.5 MG/3ML) 0.083% nebulizer solution 2.5 mg  2.5 mg Nebulization Q4H PRN Collene Gobble, MD      . alteplase (CATHFLO ACTIVASE) injection 2 mg  2 mg Intracatheter Once PRN Donato Heinz, MD      . amLODipine (NORVASC) tablet 5 mg  5 mg Oral Q24H Collene Gobble, MD   5 mg at 04/22/17 2300  . Darbepoetin Alfa (ARANESP) injection 40 mcg  40 mcg Intravenous Q Mon-HD Donato Heinz, MD   40 mcg at 04/22/17 1710  .  doxazosin (CARDURA) tablet 2 mg  2 mg Oral QHS Donato Heinz, MD   2 mg at 04/22/17 2301  . ganciclovir (CYTOVENE) 55 mg in sodium chloride 0.9 % 100 mL IVPB  0.625 mg/kg Intravenous Q M,W,F-HD Donato Heinz, MD 100 mL/hr at 04/22/17 2125 55 mg at 04/22/17 2125  . heparin injection 1,000 Units  1,000 Units Dialysis PRN Donato Heinz, MD      . heparin injection 1,700 Units  20 Units/kg Dialysis PRN Donato Heinz, MD      . heparin injection 1,900 Units  20 Units/kg Dialysis PRN Donato Heinz, MD   1,900 Units at 04/20/17 0455  . heparin injection 5,000 Units  5,000 Units Subcutaneous Q8H Collene Gobble, MD   5,000 Units at 04/22/17 2304  . Influenza vac split quadrivalent PF (FLUARIX) injection 0.5 mL  0.5 mL Intramuscular Tomorrow-1000 Rosita Fire, MD      . insulin aspart (novoLOG) injection 0-5 Units  0-5 Units Subcutaneous QHS Collene Gobble, MD   3 Units at 04/21/17 2200  . insulin aspart (novoLOG) injection 0-9 Units  0-9 Units Subcutaneous TID WC Collene Gobble, MD   2 Units at 04/23/17 [phone]  . insulin detemir (LEVEMIR) injection 15 Units  15 Units Subcutaneous Daily Rosita Fire, MD   15 Units at 04/23/17 [phone]  . latanoprost (XALATAN) 0.005 % ophthalmic solution 1 drop  1 drop Both Eyes QHS Collene Gobble, MD   1 drop at 04/22/17 2304  . lidocaine (PF) (XYLOCAINE) 1 % injection 5 mL  5 mL Intradermal PRN Donato Heinz, MD      . lidocaine-prilocaine (EMLA) cream 1 application  1 application Topical PRN Donato Heinz, MD      . metoprolol tartrate (LOPRESSOR) tablet 25 mg  25 mg Oral BID Donato Heinz, MD   25 mg at 04/23/17 0949  . ondansetron (ZOFRAN) injection 4 mg  4 mg Intravenous Q6H PRN Collene Gobble, MD      . pentafluoroprop-tetrafluoroeth (GEBAUERS) aerosol 1 application  1 application Topical PRN Donato Heinz, MD      . piperacillin-tazobactam (ZOSYN) IVPB 3.375 g  3.375 g Intravenous Q12H Wynell Balloon, RPH  12.5 mL/hr at 04/23/17 0944 3.375 g at 04/23/17 0944  . predniSONE (DELTASONE) tablet 5 mg  5 mg Oral Q breakfast Collene Gobble, MD   5 mg at 04/23/17 0935    Pt meds include: Statin :No Betablocker: Yes ASA: No Other anticoagulants/antiplatelets: none  Past Medical History:  Diagnosis Date  . Diabetes mellitus without complication ([place])   . Hypertension   . Pneumonia 04/21/2017  . Renal disorder   . 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 He then got HD in [facility] until getting a renal transplant in 2009 at [place], [place].  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    . Smoker   . Transplant recipient     Past Surgical History:  Procedure Laterality Date  . CHOLECYSTECTOMY    . HIP SURGERY    . KIDNEY TRANSPLANT      Social History Social History  Substance Use Topics  . Smoking status: Former Smoker    Packs/day: 0.50  . Smokeless tobacco: Never Used  . Alcohol use No    Family History Family History  Problem Relation Age of Onset  . Adopted: Yes    Allergies  Allergen Reactions  . Darvon [Propoxyphene] Rash    Broke out on eyelid   . Tramadol Rash     REVIEW OF SYSTEMS  General: [ ]  Weight loss, [x ] Fever, [ ]  chills Neurologic: [ ]  Dizziness, [ ]  Blackouts, [ ]  Seizure [ ]  Stroke, [ ]  "Mini stroke", [ ]  Slurred speech, [ ]  Temporary blindness; [ ]  weakness in arms or legs, [ ]  Hoarseness [ ]  Dysphagia Cardiac: [x ] Chest pain/pressure, [ ]  Shortness of breath at rest [ ]  Shortness of breath with exertion, [ ]  Atrial fibrillation or irregular heartbeat  Vascular: [ ]  Pain in legs with walking, [ ]  Pain in legs at rest, [ ]  Pain in legs at night,  [ ]  Non-healing ulcer, [ ]  Blood clot in vein/DVT,    Pulmonary: [ ]  Home oxygen, [ ]  Productive cough, [ ]  Coughing up blood, [ ]  Asthma,  [ ]  Wheezing [ ]  COPD Musculoskeletal:  [ ]  Arthritis, [ ]  Low back pain, [ ]  Joint pain Hematologic: [ ]  Easy Bruising, [ ]  Anemia; [ ]  Hepatitis Gastrointestinal: [ ]  Blood in stool, [ ]  Gastroesophageal Reflux/heartburn, Urinary: [x ] chronic Kidney  disease, [x ] on HD - [ ]  MWF or [ ]  TTHS, [ ]  Burning with urination, [ ]  Difficulty urinating Skin: [ ]  Rashes, [ ]  Wounds Psychological: [ ]  Anxiety, [ ]  Depression  Physical Examination Vitals:   04/22/17 1706 04/22/17 2150 04/23/17 0940 04/23/17 0949  BP: 106/64 126/73  120/62  Pulse: 92 (!) 102 (!) 107 (!) 110  Resp: 20 20 14    Temp: 98.2 F (36.8 C) 98.7 F (37.1 C) 98.4 F (36.9 C)   TempSrc: Oral Oral Oral   SpO2: 99% 98% 99%   Weight: 191 lb 5.8 oz (86.8 kg) 191 lb (86.6 kg)    Height:       Body mass index is 29.91 kg/m.  General:  WDWN in NAD Gait: Normal HENT: WNL Eyes: Pupils equal Pulmonary: normal non-labored breathing , without Rales, rhonchi,  wheezing Cardiac: RRR, without  Murmurs, rubs or gallops; No carotid bruits Abdomen: soft, NT, no masses Skin: no rashes, ulcers noted;  no Gangrene , no cellulitis; no open wounds;   Vascular Exam/Pulses:palpable  Radial/brachial pulses B UE.  Left UE old thrombosed fistula.     Musculoskeletal: Positive B hand  muscle wasting/ atrophy.  He denise problems with function and is right hand dominant. No edema  Neurologic: A&O X 3; Appropriate Affect ;  SENSATION: normal; MOTOR FUNCTION: 5/5 Symmetric Speech is fluent/normal   Significant Diagnostic Studies: CBC Lab Results  Component Value Date   WBC 7.9 04/23/2017   HGB 7.8 (L) 04/23/2017   HCT 24.1 (L) 04/23/2017   MCV 98.0 04/23/2017   PLT 160 04/23/2017    BMET    Component Value Date/Time   NA 138 04/23/2017 0756   K 2.8 (L) 04/23/2017 0756   CL 101 04/23/2017 0756   CO2 24 04/23/2017 0756   GLUCOSE 151 (H)  04/23/2017 0756   BUN 28 (H) 04/23/2017 0756   CREATININE 5.28 (H) 04/23/2017 0756   CALCIUM 8.5 (L) 04/23/2017 0756   GFRNONAA 12 (L) 04/23/2017 0756   GFRAA 14 (L) 04/23/2017 0756   Estimated Creatinine Clearance: 18.8 mL/min (A) (by C-G formula based on SCr of 5.28 mg/dL (H)).  COAG Lab Results  Component Value Date   INR 1.03 04/19/2017     Non-Invasive Vascular Imaging: Pending B UE vein mapping  ASSESSMENT/PLAN:  ESRD s/p kidney transplant failure Negative blood cultures to date, on IV antibiotics for possible  Sepsis due to Right upper lobe healthcare associated pneumonia  Pending vein mapping we will plan left basilic fistula creation if possible other wise we will look at the right arm for access.  We will place Tunneled catheter only tomorrow by Dr. Trula Slade after talking to Nephrology.   Laurence Slate [facility] 04/23/2017 10:35 AM   I agree with the above.  I have seen and examined the patient.  I spoke with Dr. Melvia Heaps and we will plan on placement of a [facility] on Thursday.  Fistula will de deferred to his physicians in [facility].  Annamarie Major

## 2017-04-24 NOTE — Transfer of Care (Signed)
Immediate Anesthesia Transfer of Care Note  Patient: Zaide Kardell Wold  Procedure(s) Performed: EXCHANGE  OF DIALYSIS CATHETER (Left Neck)  Patient Location: PACU  Anesthesia Type:General  Level of Consciousness: awake, alert  and oriented  Airway & Oxygen Therapy: Patient Spontanous Breathing  Post-op Assessment: Report given to RN and Post -op Vital signs reviewed and stable  Post vital signs: Reviewed and stable  Last Vitals:  Vitals:   04/24/17 1130 04/24/17 1203  BP: 121/65 126/75  Pulse: (!) 115 (!) 108  Resp:  18  Temp:  36.7 C  SpO2:  99%    Last Pain:  Vitals:   04/24/17 1203  TempSrc: Oral  PainSc:          Complications: No apparent anesthesia complications

## 2017-04-24 NOTE — Progress Notes (Signed)
PROGRESS NOTE    James [name] [facility]  QZR:[id number] DOB: Dec 16, 1970 DOA: 04/19/2017 PCP: Patient, No Pcp Per   Brief Narrative: James [name] is a 46 y.o. male with medical history significant for HTN, Diabetes, and renal failure s/p renal transplant in 3545 which was complicated by EBV-associated lymphoproliferative disorder B-Cell lymphoma. This required cessation of his immunosuppressive regimen and ultimately led to failure of his transplant. About one week ago the patient became nauseas and was unable to take his PO medications. He became febrile 2 days before admission and the next day he developed confusion, tachypnea, dyspnea, and some right sided chest discomfort.   In ED he was found to have acute renal failure, anion gap metabolic acidosis, and respiratory distress. CXR revealed right upper lobe pneumonia. He was admitted to ICU with sepsis, acute on chronic renal failure and right upper lobe pneumonia with evolving respiratory failure.   Assessment & Plan:   Active Problems:   HCAP (healthcare-associated pneumonia)   Transaminitis   Hypokalemia   Community acquired pneumonia of right upper lobe of lung ([place])   HCAP Improved. Afebrile. -continue Zosyn  Sepsis Secondary to HCAP. Resolved. Blooc cultures no growth to date.  Insulin dependent diabetes mellitus -continue Levemir and SSI  Essential hypertension -continue amlodipine, doxazosin and Lopressor  Encephalopathy Resolved. Secondary to uremia.  Hypokalemia Has been repleted in past. -manage in dialysis -recheck in AM and replete if needed  Elevated AST/ALT Abdominal ultrasound significant for hepatic steatosis. AST/ALT trending down. Asymptomatic.    DVT prophylaxis: subq heparin Code Status: Full code Family Communication: None at bedside Disposition Plan: Discharge in 24 hours if able to obtain tunneled catheter   Consultants:   Nephrology  Vascular surgery  Procedures:   Hemodialysis  (MWF)  Antimicrobials:  Vancomycin (9/28)  Zosyn (9/28>>    Subjective: No complaints today. No chest pain or dyspnea.  Objective: Vitals:   04/24/17 1030 04/24/17 1100 04/24/17 1130 04/24/17 1203  BP: 121/60 (!) 95/58 121/65 126/75  Pulse: (!) 120 (!) 120 (!) 115 (!) 108  Resp:    18  Temp:    98 F (36.7 C)  TempSrc:    Oral  SpO2:    99%  Weight:    85.5 kg (188 lb 7.9 oz)  Height:        Intake/Output Summary (Last 24 hours) at 04/24/17 1337 Last data filed at 04/24/17 1203  Gross per 24 hour  Intake              490 ml  Output                0 ml  Net              490 ml   Filed Weights   04/22/17 2150 04/24/17 0854 04/24/17 1203  Weight: 86.6 kg (191 lb) 85.8 kg (189 lb 2.5 oz) 85.5 kg (188 lb 7.9 oz)    Examination:  General exam: Appears calm and comfortable Respiratory system: Clear to auscultation. Respiratory effort normal. Cardiovascular system: S1 & S2 heard, RRR. No murmurs, rubs. Gastrointestinal system: Abdomen is nondistended, soft and nontender. No organomegaly or masses felt. Normal bowel sounds heard. Central nervous system: Alert and oriented. No focal neurological deficits. Extremities: No edema. No calf tenderness Skin: No cyanosis. No rashes Psychiatry: Judgement and insight appear normal. Mood & affect appropriate.     Data Reviewed: I have personally reviewed following labs and imaging studies  CBC:  Recent Labs Lab 04/19/17  1401  04/19/17 1900 04/20/17 0543 04/21/17 1231 04/23/17 0756 04/24/17 0727  WBC 11.3*  --  10.9* 7.6 7.9 7.9 8.5  NEUTROABS 9.5*  --   --   --   --   --   --   HGB 9.6*  < > 7.5* 7.1* 7.8* 7.8* 7.4*  HCT 27.4*  < > 22.5* 21.6* 22.6* 24.1* 23.2*  MCV 93.2  --  93.8 94.3 92.2 98.0 100.4*  PLT 127*  --  97* 99* 140* 160 192  < > = values in this interval not displayed. Basic Metabolic Panel:  Recent Labs Lab 04/19/17 1900 04/19/17 1940  04/20/17 0544 04/21/17 1231 04/22/17 0314 04/23/17 0756  04/24/17 0727  NA 135 135  < > 138 133* 139 138 139  K 3.5 3.5  < > 4.0 2.8* 2.5* 2.8* 3.1*  CL 112* 112*  < > 111 97* 104 101 103  CO2 13* 11*  < > 17* 24 25 24 23   GLUCOSE 164* 162*  < > 187* 368* 184* 151* 148*  BUN 54* 54*  < > 55* 31* 41* 28* 44*  CREATININE 11.37* 11.44*  < > 11.42* 5.26* 6.31* 5.28* 7.34*  CALCIUM 7.8* 7.8*  < > 8.1* 8.4* 8.5* 8.5* 8.8*  PHOS 6.3* 6.4*  --  6.8* 4.0  --   --   --   < > = values in this interval not displayed. GFR: Estimated Creatinine Clearance: 13.4 mL/min (A) (by C-G formula based on SCr of 7.34 mg/dL (H)). Liver Function Tests:  Recent Labs Lab 04/19/17 1401  04/20/17 0543 04/20/17 0544 04/21/17 1231 04/22/17 0314 04/23/17 0756  AST 179*  --  339*  --  176* 129* 80*  ALT 92*  --  173*  --  183* 149* 121*  ALKPHOS 99  --  82  --  86 72 75  BILITOT 1.3*  --  2.0*  --  0.9 0.9 0.9  PROT 8.0  --  6.7  --  7.3 6.5 6.5  ALBUMIN 2.5*  < > 1.9* 1.9* 2.0* 1.9* 2.1*  < > = values in this interval not displayed.  Recent Labs Lab 04/19/17 1401  LIPASE 45   No results for input(s): AMMONIA in the last 168 hours. Coagulation Profile:  Recent Labs Lab 04/19/17 1401 04/24/17 0727  INR 1.03 1.07   Cardiac Enzymes: No results for input(s): CKTOTAL, CKMB, CKMBINDEX, TROPONINI in the last 168 hours. BNP (last 3 results) No results for input(s): PROBNP in the last 8760 hours. HbA1C: No results for input(s): HGBA1C in the last 72 hours. CBG:  Recent Labs Lab 04/23/17 0831 04/23/17 1254 04/23/17 1701 04/23/17 2052 04/24/17 0826  GLUCAP 158* 189* 254* 271* 150*   Lipid Profile: No results for input(s): CHOL, HDL, LDLCALC, TRIG, CHOLHDL, LDLDIRECT in the last 72 hours. Thyroid Function Tests:  Recent Labs  04/23/17 1441  TSH 0.797   Anemia Panel:  Recent Labs  04/23/17 0756  FERRITIN 685*  TIBC 214*  IRON 37*   Sepsis Labs:  Recent Labs Lab 04/19/17 1416 04/19/17 1823 04/19/17 1836 04/19/17 1900 04/19/17 2158    PROCALCITON  --   --   --  >150.00  --   LATICACIDVEN 1.59 0.90 1.0  --  0.8    Recent Results (from the past 240 hour(s))  Blood Culture (routine x 2)     Status: None (Preliminary result)   Collection Time: 04/19/17  2:12 PM  Result Value Ref Range Status  Specimen Description BLOOD RIGHT ANTECUBITAL  Final   Special Requests   Final    BOTTLES DRAWN AEROBIC AND ANAEROBIC Blood Culture adequate volume   Culture NO GROWTH 4 DAYS  Final   Report Status PENDING  Incomplete  Blood Culture (routine x 2)     Status: None (Preliminary result)   Collection Time: 04/19/17  2:30 PM  Result Value Ref Range Status   Specimen Description BLOOD LEFT WRIST  Final   Special Requests   Final    BOTTLES DRAWN AEROBIC AND ANAEROBIC Blood Culture adequate volume   Culture NO GROWTH 4 DAYS  Final   Report Status PENDING  Incomplete  MRSA PCR Screening     Status: None   Collection Time: 04/19/17  6:28 PM  Result Value Ref Range Status   MRSA by PCR NEGATIVE NEGATIVE Final    Comment:        The GeneXpert MRSA Assay (FDA approved for NASAL specimens only), is one component of a comprehensive MRSA colonization surveillance program. It is not intended to diagnose MRSA infection nor to guide or monitor treatment for MRSA infections.   Urine culture     Status: None   Collection Time: 04/20/17  7:11 PM  Result Value Ref Range Status   Specimen Description URINE, RANDOM  Final   Special Requests NONE  Final   Culture NO GROWTH  Final   Report Status 04/21/2017 FINAL  Final         Radiology Studies: No results found.      Scheduled Meds: . amLODipine  5 mg Oral Q24H  . darbepoetin (ARANESP) injection - DIALYSIS  40 mcg Intravenous Q Mon-HD  . doxazosin  2 mg Oral QHS  . heparin  5,000 Units Subcutaneous Q8H  . insulin aspart  0-5 Units Subcutaneous QHS  . insulin aspart  0-9 Units Subcutaneous TID WC  . insulin detemir  15 Units Subcutaneous Daily  . latanoprost  1 drop Both  Eyes QHS  . metoprolol tartrate  25 mg Oral BID  . predniSONE  5 mg Oral Q breakfast   Continuous Infusions: . sodium chloride    .  ceFAZolin (ANCEF) IV    . ganciclovir (CYTOVENE) IV 55 mg (04/22/17 2125)  . piperacillin-tazobactam (ZOSYN)  IV       LOS: 5 days     Cordelia Poche, MD [facility] 04/24/2017, 1:37 PM Pager: [phone]  If 7PM-7AM, please contact night-coverage [email or web address] Password [email or web address] 04/24/2017, 1:37 PM

## 2017-04-25 ENCOUNTER — Inpatient Hospital Stay (HOSPITAL_COMMUNITY): Payer: Medicare Other

## 2017-04-25 ENCOUNTER — Encounter (HOSPITAL_COMMUNITY): Payer: Medicare Other

## 2017-04-25 ENCOUNTER — Encounter (HOSPITAL_COMMUNITY): Payer: Self-pay | Admitting: Vascular Surgery

## 2017-04-25 LAB — GLUCOSE, CAPILLARY
GLUCOSE-CAPILLARY: 339 mg/dL — AB (ref 65–99)
Glucose-Capillary: 221 mg/dL — ABNORMAL HIGH (ref 65–99)
Glucose-Capillary: 280 mg/dL — ABNORMAL HIGH (ref 65–99)

## 2017-04-25 MED ORDER — INSULIN ASPART 100 UNIT/ML FLEXPEN: 10.0000 [IU] | PEN_INJECTOR | Freq: Three times a day (TID) | SUBCUTANEOUS | 11 refills | Status: DC

## 2017-04-25 MED ORDER — HYDROCODONE-ACETAMINOPHEN 5-325 MG PO TABS
2.0000 | ORAL_TABLET | Freq: Once | ORAL | Status: AC
  Administered 2017-04-25: 2 via ORAL
  Filled 2017-04-25: qty 2

## 2017-04-25 MED ORDER — LEVOFLOXACIN 500 MG PO TABS
500.0000 mg | ORAL_TABLET | Freq: Once | ORAL | Status: AC
  Administered 2017-04-25: 500 mg via ORAL
  Filled 2017-04-25: qty 1

## 2017-04-25 MED ORDER — VALGANCICLOVIR HCL 50 MG/ML PO SOLR: 100.0000 mg | ORAL | 0 refills | Status: DC | PRN

## 2017-04-25 MED ORDER — INSULIN DETEMIR 100 UNIT/ML ~~LOC~~ SOLN
20.0000 [IU] | Freq: Every day | SUBCUTANEOUS | Status: DC
  Filled 2017-04-25: qty 0.2

## 2017-04-25 MED ORDER — TACROLIMUS 0.5 MG PO CAPS
0.5000 mg | ORAL_CAPSULE | Freq: Every day | ORAL | Status: DC
  Filled 2017-04-25: qty 1

## 2017-04-25 MED ORDER — INSULIN ASPART 100 UNIT/ML ~~LOC~~ SOLN
5.0000 [IU] | Freq: Once | SUBCUTANEOUS | Status: AC
  Administered 2017-04-25: 5 [IU] via SUBCUTANEOUS

## 2017-04-25 MED ORDER — GI COCKTAIL ~~LOC~~
30.0000 mL | Freq: Once | ORAL | Status: AC
  Administered 2017-04-25: 30 mL via ORAL
  Filled 2017-04-25: qty 30

## 2017-04-25 MED ORDER — INSULIN DETEMIR 100 UNIT/ML FLEXPEN: 20.0000 [IU] | PEN_INJECTOR | Freq: Every day | SUBCUTANEOUS | Status: DC

## 2017-04-25 NOTE — Progress Notes (Addendum)
PT Cancellation Note  Patient Details Name: James [name] MRN: [id number] DOB: 11/26/1970   Cancelled Treatment:    Reason Eval/Treat Not Completed: Patient declined, no reason specified. Pt reporting no concerns regarding mobility at home, including stairs to access bedroom. Plan is for d/c home today.  His primary concern is transportation home and transportation to HD tomorrow. Concerns relayed to RN.   Lorriane Shire 04/25/2017, 9:52 AM

## 2017-04-25 NOTE — Anesthesia Postprocedure Evaluation (Signed)
Anesthesia Post Note  Patient: James [name]  Procedure(s) Performed: EXCHANGE  OF DIALYSIS CATHETER (Left Neck)     Patient location during evaluation: PACU Anesthesia Type: MAC Level of consciousness: awake, awake and alert and oriented Pain management: pain level controlled Vital Signs Assessment: post-procedure vital signs reviewed and stable Respiratory status: spontaneous breathing, nonlabored ventilation and respiratory function stable Cardiovascular status: blood pressure returned to baseline Anesthetic complications: no    Last Vitals:  Vitals:   04/25/17 0111 04/25/17 0523  BP: (!) 150/76 131/62  Pulse: 96 99  Resp: 16 18  Temp: 36.7 C 36.7 C  SpO2: 100% 100%    Last Pain:  Vitals:   04/25/17 0523  TempSrc: Oral  PainSc:                  Lawarence Meek COKER

## 2017-04-25 NOTE — Progress Notes (Signed)
VASCULAR LAB    Patient refusing vein mapping at [facility]. Insists on having mapping with his nephrologist in [facility].     Reshanda Lewey, RVT 04/25/2017, 8:54 AM

## 2017-04-25 NOTE — Progress Notes (Signed)
Patient complained of chest pain, "feeling tight",maybe indigestion,per patient. Pain rated 4/10. Patient also attributed pain to his PNA?. BP 150/76 HR 96 RR16 O2 sat 98% on RA T98. MD on call text paged. Order received. Will continue to monitor. Rayette Mogg, Wonda Cheng, Therapist, sports

## 2017-04-25 NOTE — Progress Notes (Signed)
Patient feels glucose is elevated and requests CBG be checked. CBG=339,text paged MD on call. Order received,carried out. Malak Duchesneau, Wonda Cheng, Therapist, sports

## 2017-04-25 NOTE — Progress Notes (Signed)
Patient ID: Hau Sanor [facility], male   DOB: Sep 22, 1970, 46 y.o.   MRN: [id number]  [facility] Progress Note    Subjective:   Stable.    Objective:   BP 131/62 (BP Location: Right Leg)   Pulse 99   Temp 98 F (36.7 C) (Oral)   Resp 18   Ht 5' 7.01" (1.702 m)   Wt 83.5 kg (184 lb 1.4 oz)   SpO2 100%   BMI 28.82 kg/m   Intake/Output: I/O last 3 completed shifts: In: 22 [P.O.:240; I.V.:500; IV Piggyback:250] Out: 155 [Urine:150; Blood:5]   Intake/Output this shift:  No intake/output data recorded. Weight change:   Physical Exam: Gen:NAD CVS:no rub Resp:cta IWP:YKDXIP Ext:no edema, clotted LUE AVF   Assessment/ Plan:   1.  CKD stage 5/ failed renal Tx - now back on HD w TDC placed yesterday. Accepted for dialysis at [facility] in [facility] on MWF 1st shift schedule.  His nephrologist will be Dr Christella Hartigan.  I have discussed case with [place] doctors, they said OK to keep him on low-dose prograf (with a goal level of 2), and prednisone and Valcyte for now.  Pharm has recommended lower Valcyte dose given ESRD.  He has an appt at [facility] in November and he should keep that appt.  Have d/w primary MD who will resume prograf 0.5mg  /d and Valcyte at new dose.  And cont pred 5/d for now.   2.  RUL PNA - per primary team 3.  HTN - stable 4.  AMS - resolved, due to uremia 5.  Anemia - started esa 6.  Dispo - home today.  HD tomorrow at [facility] unit on [place].    Kelly Splinter MD [facility] pgr [phone]   04/24/2017, 10:06 AM   Labs: BMET  Recent Labs Lab 04/19/17 1900 04/19/17 1940 04/20/17 0543 04/20/17 0544 04/21/17 1231 04/22/17 0314 04/23/17 0756 04/24/17 0727  NA 135 135 138 138 133* 139 138 139  K 3.5 3.5 4.0 4.0 2.8* 2.5* 2.8* 3.1*  CL 112* 112* 112* 111 97* 104 101 103  CO2 13* 11* 15* 17* 24 25 24 23   GLUCOSE 164* 162* 192* 187* 368* 184* 151* 148*  BUN 54* 54* 55* 55* 31* 41* 28* 44*  CREATININE 11.37* 11.44*  11.78* 11.42* 5.26* 6.31* 5.28* 7.34*  ALBUMIN 2.1* 2.1* 1.9* 1.9* 2.0* 1.9* 2.1*  --   CALCIUM 7.8* 7.8* 8.1* 8.1* 8.4* 8.5* 8.5* 8.8*  PHOS 6.3* 6.4*  --  6.8* 4.0  --   --   --    CBC  Recent Labs Lab 04/19/17 1401  04/20/17 0543 04/21/17 1231 04/23/17 0756 04/24/17 0727  WBC 11.3*  < > 7.6 7.9 7.9 8.5  NEUTROABS 9.5*  --   --   --   --   --   HGB 9.6*  < > 7.1* 7.8* 7.8* 7.4*  HCT 27.4*  < > 21.6* 22.6* 24.1* 23.2*  MCV 93.2  < > 94.3 92.2 98.0 100.4*  PLT 127*  < > 99* 140* 160 192  < > = values in this interval not displayed.  @IMGRELPRIORS @ Medications:    . amLODipine  5 mg Oral Q24H  . darbepoetin (ARANESP) injection - DIALYSIS  40 mcg Intravenous Q Mon-HD  . doxazosin  2 mg Oral QHS  . heparin  5,000 Units Subcutaneous Q8H  . insulin aspart  0-5 Units Subcutaneous QHS  . insulin aspart  0-9 Units Subcutaneous  TID WC  . insulin detemir  20 Units Subcutaneous Daily  . latanoprost  1 drop Both Eyes QHS  . metoprolol tartrate  25 mg Oral BID  . predniSONE  5 mg Oral Q breakfast

## 2017-04-25 NOTE — Clinical Social Work Note (Signed)
Clinical Social Work Assessment  Patient Details  Name: James [name] MRN: [id number] Date of Birth: [birth year]  Date of referral:  04/25/17               Reason for consult:  Transportation                Permission sought to share information with:    Permission granted to share information::     Name::        Agency::     Relationship::     Contact Information:     Housing/Transportation Living arrangements for the past 2 months:  Single Family Home Source of Information:  Patient Patient Interpreter Needed:  None Criminal Activity/Legal Involvement Pertinent to Current Situation/Hospitalization:  No - Comment as needed Significant Relationships:  Other Family Members Lives with:    Do you feel safe going back to the place where you live?    Need for family participation in patient care:  No (Coment)  Care giving concerns: Patient in need of transportation resources to dialysis.   Social Worker assessment / plan: CSW met with patient at bedside. CSW provided patient with SCAT application and referred patient to DSS for Medicaid transportation. CSW encouraged client to follow up on transport quickly as they will take time to arrange, and encouraged patient to arrange transport with friends and family in the meantime if possible. Patient indicated a friend will pick him up from the hospital today to take him home.   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  Patient/Family's Understanding of and Emotional Response to Diagnosis, Current Treatment, and Prognosis: Patient alert and oriented and understanding of his medical condition.  Emotional Assessment Appearance:  Appears stated age Attitude/Demeanor/Rapport:  Other  (appropriate) Affect (typically observed):  Calm, Pleasant Orientation:  Oriented to Self, Oriented to Place, Oriented to  Time, Oriented to Situation Alcohol / Substance use:  Not Applicable Psych involvement (Current and /or in the community):  No (Comment)  Discharge Needs  Concerns to be addressed:  Other (Comment Required (transportation) Readmission within the last 30 days:  No Current discharge risk:  Other (transport) Barriers to Discharge:  No Barriers Identified   James Emms, LCSW 04/25/2017, 1:00 PM

## 2017-04-25 NOTE — Discharge Summary (Signed)
Physician Discharge Summary  James [name] ZES:[id number] DOB: 1971/05/24 DOA: 04/19/2017  PCP: Patient, No Pcp Per  Admit date: 04/19/2017 Discharge date: 04/26/2017  Admitted From: Home Disposition: Home  Recommendations for Outpatient Follow-up:  1. Follow up with PCP in 1 week 2. Please obtain CMP/CBC in one week 3. 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 His transplant was complicated by EBV-associated lymphoproliferative disorder B-cell lymphoma, requiring cessation of his immunosuppression regimen and ultimately failure of his transplant. He has been under evaluation by his primary nephrologist Dr. Joesph July for re-initiation of HD. Proximally 5 days ago he developed nausea and was unable to take his oral medications. For the last 2 days he has been febrile. On the day of admission he began to exhibit confusion and tachypnea with dyspnea. He also complained of some right-sided chest discomfort. He was brought to the emergency Department for evaluation on 9/28 and was found to have acute renal failure with an anion gap metabolic acidosis, respiratory distress in the setting of a right upper lobe pneumonia on chest x-ray. He will be admitted to the ICU with severe sepsis, acute on chronic renal failure, right upper lobe pneumonia and evolving respiratory failure.   Hospital course:  HCAP Treated with Zosyn and given a dose of Levaquin prior to discharge. Symptoms resolved. Recommend outpatient chest x-ray in 3-4 weeks.  Sepsis Secondary to HCAP. Resolved. Blood  cultures no growth (final).  Insulin dependent diabetes mellitus Levemir and sliding scale.  Essential hypertension Amlodipine, doxazosin and Lopressor.  Encephalopathy Resolved. Secondary to uremia.  Hypokalemia Has been repleted in past. Managed in dialysis.  Elevated AST/ALT Abdominal ultrasound significant for hepatic steatosis. AST/ALT trending down. Asymptomatic.  Discharge Diagnoses:  Active Problems:   HCAP (healthcare-associated pneumonia)   Transaminitis   Hypokalemia   Community acquired pneumonia of right upper lobe of lung [facility])    Discharge Instructions  Discharge Instructions    Diet - low sodium heart healthy    Complete by:  As directed    Increase activity slowly    Complete by:  As directed      Allergies as of 04/25/2017      Reactions   Darvon [propoxyphene] Rash   Broke out on eyelid    Tramadol Rash      Medication List    STOP taking these medications   furosemide 40 MG tablet Commonly known as:  LASIX   tamsulosin 0.4 MG Caps capsule Commonly known as:  FLOMAX   valGANciclovir 450 MG tablet Commonly known as:  VALCYTE Replaced by:  valganciclovir 50 MG/ML Solr     TAKE these medications   acetaminophen 500 MG tablet Commonly known as:  TYLENOL Take 500-1,000 mg by mouth every 6 (six) hours as needed for headache (pain).   amLODipine 5 MG tablet Commonly known as:  NORVASC Take 1 tablet (5 mg total) by mouth daily. What changed:  how much to take   doxazosin 2 MG tablet Commonly known as:  CARDURA Take 1 mg by mouth at bedtime.   insulin aspart 100 UNIT/ML FlexPen Commonly known as:  NOVOLOG FLEXPEN Inject 10 Units into the  skin 3 (three) times daily with meals. What changed:  how much to take  when to take this  reasons to take this   Insulin Detemir 100 UNIT/ML Pen Commonly known as:  LEVEMIR FLEXTOUCH Inject 20 Units into the skin at bedtime. What changed:  how much to take   latanoprost 0.005 %  ophthalmic solution Commonly known as:  XALATAN Place 1 drop into both eyes at bedtime.   metoprolol tartrate 25 MG tablet Commonly known as:  LOPRESSOR Take 25 mg by mouth 2 (two) times daily.   multivitamin with minerals tablet Take 1 tablet by mouth daily.   omeprazole 20 MG capsule Commonly known as:  PRILOSEC Take 20 mg by mouth daily.   predniSONE 5 MG tablet Commonly known as:  DELTASONE Take 5 mg by mouth daily with breakfast.   sodium bicarbonate 650 MG tablet Take 1,300 mg by mouth 3 (three) times daily.   tacrolimus 0.5 MG capsule Commonly known as:  PROGRAF Take 0.5-1 mg by mouth See admin instructions. Take 2 capsules (1 mg) by mouth every morning and 1 capsule (0.5 mg) every evening   valganciclovir 50 MG/ML Solr Commonly known as:  VALCYTE Take 2 mLs (100 mg total) by mouth every dialysis. Take after hemodialysis on Monday, Wednesday and Friday. Replaces:  valGANciclovir 450 MG tablet   zolpidem 10 MG tablet Commonly known as:  AMBIEN Take 10 mg by mouth at bedtime as needed for sleep.      Follow-up Information    Reeves-Daniel, Amber, DO. Schedule an appointment as soon as possible for a visit in 2 week(s).   Specialty:  Internal Medicine Why:  [place], [place] [address]  [phone]         Allergies  Allergen Reactions  . Darvon [Propoxyphene] Rash    Broke out on eyelid   . Tramadol Rash    Consultations:  Nephrology  Vascular surgery   Procedures/Studies: Dg Chest 1 View  Result Date: 04/24/2017 CLINICAL DATA:  Central line placement EXAM: CHEST 1 VIEW COMPARISON:  04/20/2017 chest radiograph. FINDINGS: Left internal jugular central venous catheter terminates over the lower right atrium. Stable cardiomediastinal silhouette with borderline mild cardiomegaly. No pneumothorax. No pleural effusion. No overt pulmonary edema. Improved aeration in the right upper lung with decreased mild patchy right upper lung  opacity. No new consolidative airspace disease. IMPRESSION: 1. Left internal jugular central venous catheter terminates over the lower right atrium. No pneumothorax. 2. Borderline mild cardiomegaly. 3. Improved aeration in the right upper lung with decreased mild patchy right upper lung opacity, which could represent decreased asymmetric pulmonary edema, improving pneumonia and/or decreased atelectasis. Continued chest radiograph follow-up recommended. Electronically Signed   By: Ilona Sorrel M.D.   On: 04/24/2017 17:41   Dg Chest Port 1 View  Result Date: 04/20/2017 CLINICAL DATA:  Sepsis and pneumonia. EXAM: PORTABLE CHEST 1 VIEW COMPARISON:  04/19/2017 prior radiographs FINDINGS: Left IJ central venous catheter with tip overlying the superior cavoatrial junction again noted. Cardiomegaly again identified. This is a mildly low volume film. Right upper lobe airspace disease/ consolidation has not significantly changed. No pneumothorax or definite pleural effusion. IMPRESSION: Low volumes without other significant change. Little change in right upper lobe airspace disease/consolidation. Electronically Signed   By: Margarette Canada M.D.   On: 04/20/2017 08:31   Dg Chest Portable 1 View  Result Date: 04/19/2017 CLINICAL DATA:  Dialysis catheter placement. EXAM: PORTABLE CHEST 1 VIEW COMPARISON:  Chest radiograph April 19, 2017 at 1405 hours FINDINGS: Interval placement dialysis catheter via LEFT internal jugular venous approach with distal tip projecting in distal superior vena cava. Cardiac silhouette is mildly enlarged. Pulmonary vascular congestion. Persistent RIGHT upper lobe consolidation without pleural effusion. No pneumothorax. Osseous structures are nonsuspicious. IMPRESSION: Dialysis catheter distal tip projects in distal superior vena cava. No pneumothorax. RIGHT upper lobe pneumonia. Followup PA and lateral chest X-ray is recommended in 3-4 weeks following trial of antibiotic therapy to ensure  resolution and exclude underlying malignancy. Mild cardiomegaly and pulmonary vascular congestion. Electronically Signed   By: Elon Alas M.D.   On: 04/19/2017 17:47   Dg Chest Port 1 View  Result Date: 04/19/2017 CLINICAL DATA:  Hypertension.  Sepsis. EXAM: PORTABLE CHEST 1 VIEW COMPARISON:  August 14, 2015 FINDINGS: There is extensive airspace consolidation in the right upper lobe. Lungs elsewhere clear. Heart is upper normal in size with pulmonary vascularity within normal limits. No adenopathy. No bone lesions. IMPRESSION: Extensive airspace opacity throughout much of the right upper lobe, most likely due to pneumonia. Lungs elsewhere clear. Heart upper normal in size. Followup PA and lateral chest radiographs recommended in 3-4 weeks following trial of antibiotic therapy to ensure resolution and exclude underlying malignancy. Electronically Signed   By: Lowella Grip III M.D.   On: 04/19/2017 14:21   Dg Fluoro Guide Cv Line-no Report  Result Date: 04/24/2017 Fluoroscopy was utilized by the requesting physician.  No radiographic interpretation.   US Abdomen Limited Ruq  Result Date: 04/20/2017 CLINICAL DATA:  Transaminitis. Renal transplant. Gallbladder removed. EXAM: ULTRASOUND ABDOMEN LIMITED RIGHT UPPER QUADRANT COMPARISON:  08/14/2015 FINDINGS: Gallbladder: Status post cholecystectomy. Common bile duct: Diameter: 3.0 mm Liver: Liver is enlarged and echogenic. There is attenuation of the ultrasound wave, poor visualization of the internal hepatic architecture, and loss of definition of the diaphragm. No focal mass identified. Portal vein is patent on color Doppler imaging with normal direction of blood flow towards the liver. Study quality is degraded by patient body habitus and overlying bowel gas. IMPRESSION: 1. Hepatic steatosis. 2. No focal liver lesion. 3. Status post cholecystectomy. Electronically Signed   By: Nolon Nations M.D.   On: 04/20/2017 11:42     Subjective: No  chest pain or dyspnea.  Discharge Exam: Vitals:   04/25/17 0111 04/25/17 0523  BP: (!) 150/76 131/62  Pulse: 96 99  Resp: 16 18  Temp: 98 F (36.7 C) 98 F (36.7 C)  SpO2: 100% 100%   Vitals:   04/24/17 1933 04/24/17 2030 04/25/17 0111 04/25/17 0523  BP: (!) 152/91 (!) 153/40 (!) 150/76 131/62  Pulse: 90 (!) 104 96 99  Resp: 15 16 16 18   Temp: (!) 97 F (36.1 C) 98.4 F (36.9 C) 98 F (36.7 C) 98 F (36.7 C)  TempSrc:  Oral Oral Oral  SpO2: 100% 100% 100% 100%  Weight:  83.5 kg (184 lb 1.4 oz)    Height:        General exam: Appears calm and comfortable Respiratory system: Clear to auscultation. Respiratory effort normal. Cardiovascular system: S1 & S2 heard, RRR. No murmurs. Gastrointestinal system: Abdomen is nondistended, soft and nontender. Normal bowel sounds heard. Central nervous system: Alert and oriented. No focal neurological deficits. Extremities: No edema. No calf tenderness Skin: No cyanosis. No rashes Psychiatry: Judgement and insight appear normal. Mood & affect appropriate.    The results of significant diagnostics from this hospitalization (including imaging, microbiology, ancillary and laboratory) are listed below for reference.  Microbiology: Recent Results (from the past 240 hour(s))  Blood Culture (routine x 2)     Status: None   Collection Time: 04/19/17  2:12 PM  Result Value Ref Range Status   Specimen Description BLOOD RIGHT ANTECUBITAL  Final   Special Requests   Final    BOTTLES DRAWN AEROBIC AND ANAEROBIC Blood Culture adequate volume   Culture NO GROWTH 5 DAYS  Final   Report Status 04/24/2017 FINAL  Final  Blood Culture (routine x 2)     Status: None   Collection Time: 04/19/17  2:30 PM  Result Value Ref Range Status   Specimen Description BLOOD LEFT WRIST  Final   Special Requests   Final    BOTTLES DRAWN AEROBIC AND ANAEROBIC Blood Culture adequate volume   Culture NO GROWTH 5 DAYS  Final   Report Status 04/24/2017 FINAL   Final  MRSA PCR Screening     Status: None   Collection Time: 04/19/17  6:28 PM  Result Value Ref Range Status   MRSA by PCR NEGATIVE NEGATIVE Final    Comment:        The GeneXpert MRSA Assay (FDA approved for NASAL specimens only), is one component of a comprehensive MRSA colonization surveillance program. It is not intended to diagnose MRSA infection nor to guide or monitor treatment for MRSA infections.   Urine culture     Status: None   Collection Time: 04/20/17  7:11 PM  Result Value Ref Range Status   Specimen Description URINE, RANDOM  Final   Special Requests NONE  Final   Culture NO GROWTH  Final   Report Status 04/21/2017 FINAL  Final     Labs: BNP (last 3 results)  Recent Labs  04/19/17 1401  BNP 16.1   Basic Metabolic Panel:  Recent Labs Lab 04/19/17 1900 04/19/17 1940  04/20/17 0544 04/21/17 1231 04/22/17 0314 04/23/17 0756 04/24/17 0727  NA 135 135  < > 138 133* 139 138 139  K 3.5 3.5  < > 4.0 2.8* 2.5* 2.8* 3.1*  CL 112* 112*  < > 111 97* 104 101 103  CO2 13* 11*  < > 17* 24 25 24 23   GLUCOSE 164* 162*  < > 187* 368* 184* 151* 148*  BUN 54* 54*  < > 55* 31* 41* 28* 44*  CREATININE 11.37* 11.44*  < > 11.42* 5.26* 6.31* 5.28* 7.34*  CALCIUM 7.8* 7.8*  < > 8.1* 8.4* 8.5* 8.5* 8.8*  PHOS 6.3* 6.4*  --  6.8* 4.0  --   --   --   < > = values in this interval not displayed. Liver Function Tests:  Recent Labs Lab 04/20/17 0543 04/20/17 0544 04/21/17 1231 04/22/17 0314 04/23/17 0756  AST 339*  --  176* 129* 80*  ALT 173*  --  183* 149* 121*  ALKPHOS 82  --  86 72 75  BILITOT 2.0*  --  0.9 0.9 0.9  PROT 6.7  --  7.3 6.5 6.5  ALBUMIN 1.9* 1.9* 2.0* 1.9* 2.1*   No results for input(s): LIPASE, AMYLASE in the last 168 hours. No results for input(s): AMMONIA in the last 168 hours. CBC:  Recent Labs Lab 04/19/17 1900 04/20/17 0543 04/21/17 1231 04/23/17 0756 04/24/17 0727  WBC 10.9* 7.6 7.9 7.9 8.5  HGB 7.5* 7.1* 7.8* 7.8* 7.4*  HCT  22.5* 21.6* 22.6* 24.1* 23.2*  MCV 93.8 94.3 92.2 98.0 100.4*  PLT 97* 99* 140* 160 192   Cardiac Enzymes: No results  for input(s): CKTOTAL, CKMB, CKMBINDEX, TROPONINI in the last 168 hours. BNP: Invalid input(s): POCBNP CBG:  Recent Labs Lab 04/24/17 1700 04/24/17 2119 04/25/17 0226 04/25/17 0910 04/25/17 1218  GLUCAP 157* 241* 339* 221* 280*   D-Dimer No results for input(s): DDIMER in the last 72 hours. Hgb A1c No results for input(s): HGBA1C in the last 72 hours. Lipid Profile No results for input(s): CHOL, HDL, LDLCALC, TRIG, CHOLHDL, LDLDIRECT in the last 72 hours. Thyroid function studies  Recent Labs  04/23/17 1441  TSH 0.797   Anemia work up No results for input(s): VITAMINB12, FOLATE, FERRITIN, TIBC, IRON, RETICCTPCT in the last 72 hours. Urinalysis    Component Value Date/Time   COLORURINE YELLOW 04/20/2017 1912   APPEARANCEUR CLEAR 04/20/2017 1912   LABSPEC 1.014 04/20/2017 1912   PHURINE 6.0 04/20/2017 1912   GLUCOSEU 150 (A) 04/20/2017 1912   HGBUR LARGE (A) 04/20/2017 [place] NEGATIVE 04/20/2017 1912   KETONESUR 5 (A) 04/20/2017 1912   PROTEINUR >=300 (A) 04/20/2017 1912   UROBILINOGEN 0.2 04/09/2014 2013   NITRITE NEGATIVE 04/20/2017 1912   LEUKOCYTESUR NEGATIVE 04/20/2017 1912   Sepsis Labs Invalid input(s): PROCALCITONIN,  WBC,  LACTICIDVEN Microbiology Recent Results (from the past 240 hour(s))  Blood Culture (routine x 2)     Status: None   Collection Time: 04/19/17  2:12 PM  Result Value Ref Range Status   Specimen Description BLOOD RIGHT ANTECUBITAL  Final   Special Requests   Final    BOTTLES DRAWN AEROBIC AND ANAEROBIC Blood Culture adequate volume   Culture NO GROWTH 5 DAYS  Final   Report Status 04/24/2017 FINAL  Final  Blood Culture (routine x 2)     Status: None   Collection Time: 04/19/17  2:30 PM  Result Value Ref Range Status   Specimen Description BLOOD LEFT WRIST  Final   Special Requests   Final    BOTTLES  DRAWN AEROBIC AND ANAEROBIC Blood Culture adequate volume   Culture NO GROWTH 5 DAYS  Final   Report Status 04/24/2017 FINAL  Final  MRSA PCR Screening     Status: None   Collection Time: 04/19/17  6:28 PM  Result Value Ref Range Status   MRSA by PCR NEGATIVE NEGATIVE Final    Comment:        The GeneXpert MRSA Assay (FDA approved for NASAL specimens only), is one component of a comprehensive MRSA colonization surveillance program. It is not intended to diagnose MRSA infection nor to guide or monitor treatment for MRSA infections.   Urine culture     Status: None   Collection Time: 04/20/17  7:11 PM  Result Value Ref Range Status   Specimen Description URINE, RANDOM  Final   Special Requests NONE  Final   Culture NO GROWTH  Final   Report Status 04/21/2017 FINAL  Final     Time coordinating discharge: Over 30 minutes  SIGNED:   Cordelia Poche, MD [facility] 04/26/2017, 2:21 PM Pager [phone]  If 7PM-7AM, please contact night-coverage [email or web address] Password [email or web address]

## 2017-04-25 NOTE — Discharge Instructions (Signed)
End-Stage Kidney Disease °End-stage kidney disease occurs when the kidneys are so damaged that they cannot do their job. The kidneys are two organs that do many important jobs in the body, which include: °· Removing wastes and extra fluids from the blood. °· Making hormones that maintain the amount of fluid in your tissues and blood vessels. °· Maintaining the right amount of fluids and chemicals in the body. ° °When the kidneys are damaged and cannot do their job, life-threatening problems occur. Without the help of the kidneys, toxins build up in the blood. In end-stage kidney disease, the kidneys cannot get better. °What are the causes? °End-stage kidney disease usually occurs when a long-lasting (chronic) kidney disease gets worse. It may also occur after the kidneys are suddenly damaged (acute kidney injury). °What increases the risk? °This condition is more likely to develop in people who are: °· Older than age 60. °· Male. °· Of African-American descent. °· Current smokers or former smokers. °· Obese. ° °You may also have an increased risk for end-stage kidney disease if you: °· Have a family history of chronic kidney disease (CKD). °· Have had kidney disease for many years. °· Have other longstanding medical conditions that affect the kidneys, such as: °? Cardiovascular disease including high blood pressure. °? Diabetes. °? Certain diseases that affect the immune system. ° °What are the signs or symptoms? °· Swelling (edema) of the face, legs, ankles, or feet. °· Numbness, tingling, or loss of feeling (sensation) in your hands or feet. °· Tiredness (lethargy). °· Nausea or vomiting. °· Confusion, trouble concentrating, or loss of consciousness. °· Chest pain. °· Shortness of breath. °· Little to no urine production. °· Muscle twitches and cramps, especially in the legs. °· Constant itchiness. °· Loss of appetite. °· Pale skin and tissue lining your eyelids (conjunctiva). °· Headaches. °· Abnormally dark or  light skin. °· Decrease in muscle size (muscle wasting). °· Easy bruising. °· Frequent hiccups. °· Stopping of menstruation in women. °· Seizures. °How is this diagnosed? °Your health care provider will measure your blood pressure and do some tests. These may include: °· Urine tests. °· Blood tests. °· Imaging tests. °· A test in which a sample of tissue is removed from the kidneys to be looked at under a microscope (kidney biopsy). ° °How is this treated? °There are two treatments for end-stage kidney disease: °· A procedure that removes toxic wastes from the body (dialysis). Depending on the type of dialysis you choose, it may be performed more than one time a day (peritoneal dialysis) or several times a week (hemodialysis). °· Surgery to receive a new kidney (kidney transplant). ° °In addition to having dialysis or a kidney transplant, you may need to take medicines: °· To control high blood pressure (hypertension). °· To control cholesterol. °· To maintain healthy electrolyte levels in your blood. ° °You may also be given a specific diet to follow that includes requirements or limits for: °· Salt (sodium). °· Protein. °· Phosphorous. °· Potassium. °· Calcium. ° °Follow these instructions at home: °· Follow your prescribed diet. °· Take over-the-counter and prescription medicines only as told by your health care provider. °? Do not take any new medicines unless approved by your health care provider. Many medicines can worsen your kidney damage. °? Do not take any vitamin and mineral supplements unless approved by your health care provider. Many nutritional supplements can worsen your kidney damage. °? The dose of some medicines that you take may need to be   adjusted. °· Do not use any tobacco products, such as cigarettes, chewing tobacco, and e-cigarettes. If you need help quitting, ask your health care provider. °· Keep all follow-up visits as told by your health care provider. This is important. °· Keep track of  your blood pressure. Report changes in your blood pressure as told by your health care provider. °· Achieve and maintain a healthy weight. If you need help with this, ask your health care provider. °· Start or continue an exercise plan. Try to exercise at least 30 minutes a day, 5 days a week. °· Stay current with immunizations as told by your health care provider. °Where to find more information: °· American Association of Kidney Patients: www.aakp.org °· National Kidney Foundation: www.kidney.org °· American Kidney Fund: www.akfinc.org °· [employer] [facility]: www.lifeoptions.org and www.kidneyschool.org °Contact a health care provider if: °· Your symptoms get worse. °· You develop new symptoms. °Get help right away if: °· You have weakness in an arm or leg on one side of your body. °· You have difficulty speaking or you are slurring your speech. °· You have a sudden change in your vision. °· You have a sudden, severe headache. °· You have a sudden weight increase. °· You have difficulty breathing. °· Your symptoms suddenly get worse. °This information is not intended to replace advice given to you by your health care provider. Make sure you discuss any questions you have with your health care provider. °Document Released: 09/29/2003 Document Revised: 12/15/2015 Document Reviewed: 03/07/2012 °Elsevier Interactive Patient Education © 2017 [employer] Inc. ° °

## 2017-08-23 HISTORY — PX: PERITONEAL CATHETER INSERTION: SHX2223

## 2017-09-21 ENCOUNTER — Emergency Department (HOSPITAL_COMMUNITY): Payer: Medicare Other

## 2017-09-21 ENCOUNTER — Inpatient Hospital Stay (HOSPITAL_COMMUNITY)
Admission: EM | Admit: 2017-09-21 | Discharge: 2017-09-23 | DRG: 193 | Disposition: A | Discharge status: Other Acute Inpatient Hospital | Payer: Medicare Other | Attending: Internal Medicine | Admitting: Internal Medicine

## 2017-09-21 ENCOUNTER — Encounter (HOSPITAL_COMMUNITY): Payer: Self-pay

## 2017-09-21 ENCOUNTER — Other Ambulatory Visit: Payer: Self-pay

## 2017-09-21 DIAGNOSIS — D47Z1 Post-transplant lymphoproliferative disorder (PTLD): Secondary | ICD-10-CM | POA: Diagnosis present

## 2017-09-21 DIAGNOSIS — N186 End stage renal disease: Secondary | ICD-10-CM

## 2017-09-21 DIAGNOSIS — R000 Tachycardia, unspecified: Secondary | ICD-10-CM | POA: Diagnosis present

## 2017-09-21 DIAGNOSIS — R509 Fever, unspecified: Secondary | ICD-10-CM | POA: Diagnosis present

## 2017-09-21 DIAGNOSIS — I10 Essential (primary) hypertension: Secondary | ICD-10-CM | POA: Diagnosis not present

## 2017-09-21 DIAGNOSIS — Z885 Allergy status to narcotic agent status: Secondary | ICD-10-CM

## 2017-09-21 DIAGNOSIS — J101 Influenza due to other identified influenza virus with other respiratory manifestations: Secondary | ICD-10-CM | POA: Diagnosis present

## 2017-09-21 DIAGNOSIS — I120 Hypertensive chronic kidney disease with stage 5 chronic kidney disease or end stage renal disease: Secondary | ICD-10-CM | POA: Diagnosis present

## 2017-09-21 DIAGNOSIS — Z6829 Body mass index (BMI) 29.0-29.9, adult: Secondary | ICD-10-CM

## 2017-09-21 DIAGNOSIS — E1122 Type 2 diabetes mellitus with diabetic chronic kidney disease: Secondary | ICD-10-CM | POA: Diagnosis present

## 2017-09-21 DIAGNOSIS — J1000 Influenza due to other identified influenza virus with unspecified type of pneumonia: Principal | ICD-10-CM | POA: Diagnosis present

## 2017-09-21 DIAGNOSIS — R252 Cramp and spasm: Secondary | ICD-10-CM | POA: Diagnosis not present

## 2017-09-21 DIAGNOSIS — Y95 Nosocomial condition: Secondary | ICD-10-CM | POA: Diagnosis present

## 2017-09-21 DIAGNOSIS — Z992 Dependence on renal dialysis: Secondary | ICD-10-CM | POA: Diagnosis not present

## 2017-09-21 DIAGNOSIS — Z794 Long term (current) use of insulin: Secondary | ICD-10-CM

## 2017-09-21 DIAGNOSIS — T8612 Kidney transplant failure: Secondary | ICD-10-CM | POA: Diagnosis present

## 2017-09-21 DIAGNOSIS — J189 Pneumonia, unspecified organism: Secondary | ICD-10-CM | POA: Diagnosis present

## 2017-09-21 DIAGNOSIS — E669 Obesity, unspecified: Secondary | ICD-10-CM | POA: Diagnosis present

## 2017-09-21 DIAGNOSIS — Z940 Kidney transplant status: Secondary | ICD-10-CM

## 2017-09-21 DIAGNOSIS — Z888 Allergy status to other drugs, medicaments and biological substances status: Secondary | ICD-10-CM | POA: Diagnosis not present

## 2017-09-21 DIAGNOSIS — D631 Anemia in chronic kidney disease: Secondary | ICD-10-CM | POA: Diagnosis present

## 2017-09-21 DIAGNOSIS — Z79899 Other long term (current) drug therapy: Secondary | ICD-10-CM

## 2017-09-21 DIAGNOSIS — E119 Type 2 diabetes mellitus without complications: Secondary | ICD-10-CM

## 2017-09-21 DIAGNOSIS — J181 Lobar pneumonia, unspecified organism: Secondary | ICD-10-CM | POA: Diagnosis present

## 2017-09-21 DIAGNOSIS — D649 Anemia, unspecified: Secondary | ICD-10-CM | POA: Insufficient documentation

## 2017-09-21 DIAGNOSIS — Z87891 Personal history of nicotine dependence: Secondary | ICD-10-CM | POA: Diagnosis not present

## 2017-09-21 HISTORY — DX: Influenza due to other identified influenza virus with other respiratory manifestations: J10.1

## 2017-09-21 HISTORY — DX: Anemia, unspecified: D64.9

## 2017-09-21 HISTORY — DX: Tachycardia, unspecified: R00.0

## 2017-09-21 LAB — CBC WITH DIFFERENTIAL/PLATELET
BASOS PCT: 0 %
Basophils Absolute: 0 10*3/uL (ref 0.0–0.1)
EOS ABS: 0.1 10*3/uL (ref 0.0–0.7)
EOS PCT: 1 %
HCT: 29.2 % — ABNORMAL LOW (ref 39.0–52.0)
Hemoglobin: 9.4 g/dL — ABNORMAL LOW (ref 13.0–17.0)
LYMPHS ABS: 0.6 10*3/uL — AB (ref 0.7–4.0)
Lymphocytes Relative: 12 %
MCH: 31.8 pg (ref 26.0–34.0)
MCHC: 32.2 g/dL (ref 30.0–36.0)
MCV: 98.6 fL (ref 78.0–100.0)
Monocytes Absolute: 0.2 10*3/uL (ref 0.1–1.0)
Monocytes Relative: 5 %
Neutro Abs: 3.6 10*3/uL (ref 1.7–7.7)
Neutrophils Relative %: 82 %
PLATELETS: 145 10*3/uL — AB (ref 150–400)
RBC: 2.96 MIL/uL — ABNORMAL LOW (ref 4.22–5.81)
RDW: 15.6 % — ABNORMAL HIGH (ref 11.5–15.5)
WBC: 4.4 10*3/uL (ref 4.0–10.5)

## 2017-09-21 LAB — URINALYSIS, ROUTINE W REFLEX MICROSCOPIC
Bacteria, UA: NONE SEEN
Bilirubin Urine: NEGATIVE
GLUCOSE, UA: 50 mg/dL — AB
HGB URINE DIPSTICK: NEGATIVE
Ketones, ur: NEGATIVE mg/dL
Leukocytes, UA: NEGATIVE
NITRITE: NEGATIVE
PH: 9 — AB (ref 5.0–8.0)
Protein, ur: >=300 mg/dL — AB
Specific Gravity, Urine: 1.011 (ref 1.005–1.030)

## 2017-09-21 LAB — COMPREHENSIVE METABOLIC PANEL
ALK PHOS: 131 U/L — AB (ref 38–126)
ALT: 25 U/L (ref 17–63)
AST: 31 U/L (ref 15–41)
Albumin: 3.2 g/dL — ABNORMAL LOW (ref 3.5–5.0)
Anion gap: 13 (ref 5–15)
BILIRUBIN TOTAL: 0.9 mg/dL (ref 0.3–1.2)
BUN: 46 mg/dL — AB (ref 6–20)
CALCIUM: 8.5 mg/dL — AB (ref 8.9–10.3)
CHLORIDE: 105 mmol/L (ref 101–111)
CO2: 21 mmol/L — ABNORMAL LOW (ref 22–32)
CREATININE: 8.03 mg/dL — AB (ref 0.61–1.24)
GFR calc non Af Amer: 7 mL/min — ABNORMAL LOW (ref >60)
GFR, EST AFRICAN AMERICAN: 8 mL/min — AB (ref >60)
Glucose, Bld: 104 mg/dL — ABNORMAL HIGH (ref 65–99)
Potassium: 4.2 mmol/L (ref 3.5–5.1)
Sodium: 139 mmol/L (ref 135–145)
Total Protein: 6.9 g/dL (ref 6.5–8.1)

## 2017-09-21 LAB — INFLUENZA PANEL BY PCR (TYPE A & B)
INFLAPCR: POSITIVE — AB
INFLBPCR: NEGATIVE

## 2017-09-21 LAB — LACTIC ACID, PLASMA
LACTIC ACID, VENOUS: 1.1 mmol/L (ref 0.5–1.9)
Lactic Acid, Venous: 1.1 mmol/L (ref 0.5–1.9)

## 2017-09-21 LAB — CBG MONITORING, ED: Glucose-Capillary: 94 mg/dL (ref 65–99)

## 2017-09-21 LAB — GLUCOSE, CAPILLARY: Glucose-Capillary: 97 mg/dL (ref 65–99)

## 2017-09-21 LAB — HEMOGLOBIN A1C
HEMOGLOBIN A1C: 6.4 % — AB (ref 4.8–5.6)
Mean Plasma Glucose: 136.98 mg/dL

## 2017-09-21 LAB — RAPID STREP SCREEN (MED CTR MEBANE ONLY): Streptococcus, Group A Screen (Direct): NEGATIVE

## 2017-09-21 LAB — I-STAT TROPONIN, ED: TROPONIN I, POC: 0.01 ng/mL (ref 0.00–0.08)

## 2017-09-21 LAB — I-STAT CG4 LACTIC ACID, ED: LACTIC ACID, VENOUS: 1.87 mmol/L (ref 0.5–1.9)

## 2017-09-21 MED ORDER — OSELTAMIVIR PHOSPHATE 75 MG PO CAPS
75.0000 mg | ORAL_CAPSULE | Freq: Once | ORAL | Status: AC
  Administered 2017-09-21: 75 mg via ORAL
  Filled 2017-09-21: qty 1

## 2017-09-21 MED ORDER — METOPROLOL TARTRATE 50 MG PO TABS
75.0000 mg | ORAL_TABLET | Freq: Two times a day (BID) | ORAL | Status: DC
  Administered 2017-09-22 – 2017-09-23 (×4): 75 mg via ORAL
  Filled 2017-09-21 (×2): qty 3
  Filled 2017-09-21: qty 1
  Filled 2017-09-21 (×2): qty 3

## 2017-09-21 MED ORDER — HEPARIN SODIUM (PORCINE) 5000 UNIT/ML IJ SOLN
5000.0000 [IU] | Freq: Three times a day (TID) | INTRAMUSCULAR | Status: DC
  Administered 2017-09-22 – 2017-09-23 (×5): 5000 [IU] via SUBCUTANEOUS
  Filled 2017-09-21 (×5): qty 1

## 2017-09-21 MED ORDER — SODIUM BICARBONATE 650 MG PO TABS: 1300.0000 mg | ORAL_TABLET | Freq: Three times a day (TID) | ORAL | Status: DC

## 2017-09-21 MED ORDER — PREDNISONE 5 MG PO TABS
5.0000 mg | ORAL_TABLET | Freq: Every day | ORAL | Status: DC
  Administered 2017-09-22 – 2017-09-23 (×2): 5 mg via ORAL
  Filled 2017-09-21 (×2): qty 1

## 2017-09-21 MED ORDER — AMLODIPINE BESYLATE 5 MG PO TABS: 10.0000 mg | ORAL_TABLET | Freq: Every day | ORAL | Status: DC

## 2017-09-21 MED ORDER — TACROLIMUS 0.5 MG PO CAPS: 0.5000 mg | ORAL_CAPSULE | ORAL | Status: DC

## 2017-09-21 MED ORDER — SODIUM CHLORIDE 0.9 % IV SOLN
2.0000 g | INTRAVENOUS | Status: DC
  Administered 2017-09-23: 2 g via INTRAVENOUS
  Filled 2017-09-21 (×2): qty 2

## 2017-09-21 MED ORDER — TACROLIMUS 1 MG PO CAPS
1.0000 mg | ORAL_CAPSULE | Freq: Every day | ORAL | Status: DC
  Administered 2017-09-22: 1 mg via ORAL
  Filled 2017-09-21: qty 1

## 2017-09-21 MED ORDER — SODIUM CHLORIDE 0.9 % IV BOLUS (SEPSIS)
1000.0000 mL | Freq: Once | INTRAVENOUS | Status: AC
  Administered 2017-09-21: 1000 mL via INTRAVENOUS

## 2017-09-21 MED ORDER — METOPROLOL TARTRATE 25 MG PO TABS
25.0000 mg | ORAL_TABLET | Freq: Once | ORAL | Status: AC
Start: 2017-09-21 — End: 2017-09-21
  Administered 2017-09-21: 25 mg via ORAL
  Filled 2017-09-21: qty 1

## 2017-09-21 MED ORDER — PANTOPRAZOLE SODIUM 40 MG PO TBEC
40.0000 mg | DELAYED_RELEASE_TABLET | Freq: Every day | ORAL | Status: DC
  Administered 2017-09-22 – 2017-09-23 (×2): 40 mg via ORAL
  Filled 2017-09-21 (×2): qty 1

## 2017-09-21 MED ORDER — INSULIN ASPART 100 UNIT/ML ~~LOC~~ SOLN: 0.0000 [IU] | Freq: Every day | SUBCUTANEOUS | Status: DC

## 2017-09-21 MED ORDER — ACETAMINOPHEN 325 MG PO TABS: 650.0000 mg | ORAL_TABLET | Freq: Once | ORAL | Status: DC

## 2017-09-21 MED ORDER — ZOLPIDEM TARTRATE 5 MG PO TABS: 10.0000 mg | ORAL_TABLET | Freq: Every evening | ORAL | Status: DC | PRN

## 2017-09-21 MED ORDER — ACETAMINOPHEN 500 MG PO TABS
500.0000 mg | ORAL_TABLET | Freq: Four times a day (QID) | ORAL | Status: DC | PRN
  Administered 2017-09-22: 500 mg via ORAL
  Administered 2017-09-22: 1000 mg via ORAL
  Administered 2017-09-22: 500 mg via ORAL
  Administered 2017-09-22: 1000 mg via ORAL
  Filled 2017-09-21 (×2): qty 1
  Filled 2017-09-21 (×2): qty 2

## 2017-09-21 MED ORDER — TACROLIMUS 0.5 MG PO CAPS
0.5000 mg | ORAL_CAPSULE | Freq: Every day | ORAL | Status: DC
  Administered 2017-09-22: 0.5 mg via ORAL
  Filled 2017-09-21 (×3): qty 1

## 2017-09-21 MED ORDER — INSULIN ASPART 100 UNIT/ML ~~LOC~~ SOLN
0.0000 [IU] | Freq: Three times a day (TID) | SUBCUTANEOUS | Status: DC
  Administered 2017-09-22 – 2017-09-23 (×3): 1 [IU] via SUBCUTANEOUS
  Administered 2017-09-23: 3 [IU] via SUBCUTANEOUS

## 2017-09-21 MED ORDER — SODIUM CHLORIDE 0.9% FLUSH: 3.0000 mL | INTRAVENOUS | Status: DC | PRN

## 2017-09-21 MED ORDER — LATANOPROST 0.005 % OP SOLN
1.0000 [drp] | Freq: Every day | OPHTHALMIC | Status: DC
  Administered 2017-09-22 (×2): 1 [drp] via OPHTHALMIC
  Filled 2017-09-21 (×2): qty 2.5

## 2017-09-21 MED ORDER — ONDANSETRON HCL 4 MG/2ML IJ SOLN: 4.0000 mg | Freq: Four times a day (QID) | INTRAMUSCULAR | Status: DC | PRN

## 2017-09-21 MED ORDER — ONDANSETRON HCL 4 MG PO TABS: 4.0000 mg | ORAL_TABLET | Freq: Four times a day (QID) | ORAL | Status: DC | PRN

## 2017-09-21 MED ORDER — SODIUM CHLORIDE 0.9 % IV SOLN
2.0000 g | Freq: Once | INTRAVENOUS | Status: AC
  Administered 2017-09-21: 2 g via INTRAVENOUS
  Filled 2017-09-21: qty 2

## 2017-09-21 MED ORDER — SODIUM CHLORIDE 0.9 % IV SOLN: 250.0000 mL | INTRAVENOUS | Status: DC | PRN

## 2017-09-21 MED ORDER — VANCOMYCIN HCL IN DEXTROSE 1-5 GM/200ML-% IV SOLN
1000.0000 mg | INTRAVENOUS | Status: DC
  Administered 2017-09-23: 1000 mg via INTRAVENOUS
  Filled 2017-09-21 (×2): qty 200

## 2017-09-21 MED ORDER — RENA-VITE PO TABS
1.0000 | ORAL_TABLET | Freq: Every day | ORAL | Status: DC
  Administered 2017-09-22 (×2): 1 via ORAL
  Filled 2017-09-21 (×2): qty 1

## 2017-09-21 MED ORDER — SODIUM CHLORIDE 0.9% FLUSH
3.0000 mL | Freq: Two times a day (BID) | INTRAVENOUS | Status: DC
  Administered 2017-09-22 (×2): 3 mL via INTRAVENOUS

## 2017-09-21 MED ORDER — VANCOMYCIN HCL 10 G IV SOLR
2000.0000 mg | Freq: Once | INTRAVENOUS | Status: AC
  Administered 2017-09-21: 2000 mg via INTRAVENOUS
  Filled 2017-09-21: qty 2000

## 2017-09-21 MED ORDER — DOXAZOSIN MESYLATE 2 MG PO TABS
1.0000 mg | ORAL_TABLET | Freq: Every day | ORAL | Status: DC
  Administered 2017-09-22 (×2): 1 mg via ORAL
  Filled 2017-09-21 (×3): qty 1

## 2017-09-21 MED ORDER — AMLODIPINE BESYLATE 5 MG PO TABS
5.0000 mg | ORAL_TABLET | Freq: Once | ORAL | Status: AC
Start: 2017-09-21 — End: 2017-09-21
  Administered 2017-09-21: 5 mg via ORAL
  Filled 2017-09-21: qty 1

## 2017-09-21 MED ORDER — INSULIN DETEMIR 100 UNIT/ML ~~LOC~~ SOLN
20.0000 [IU] | Freq: Every day | SUBCUTANEOUS | Status: DC
  Administered 2017-09-22: 20 [IU] via SUBCUTANEOUS
  Filled 2017-09-21 (×2): qty 0.2

## 2017-09-21 MED ORDER — ACETAMINOPHEN 500 MG PO TABS: 500.0000 mg | ORAL_TABLET | Freq: Once | ORAL | Status: DC

## 2017-09-21 MED ORDER — AMLODIPINE BESYLATE 10 MG PO TABS
10.0000 mg | ORAL_TABLET | Freq: Every day | ORAL | Status: DC
  Administered 2017-09-22: 10 mg via ORAL
  Filled 2017-09-21: qty 1

## 2017-09-21 NOTE — ED Triage Notes (Signed)
Patient brought in by [facility] for complaint of fever, emesis, and productive cough with thick white sputum that started last night. Temp 102.9 oral with EMS, given 1000mg  tylenol PTA. MWF dialysis, last complete treatment Friday. Recently placed peritoneal dialysis catheter, insertion site not red or painful.

## 2017-09-21 NOTE — ED Notes (Signed)
Please contact wife with any significant updates in care. [facility] [phone]

## 2017-09-21 NOTE — H&P (Signed)
History and Physical    James [name] SVX:[id number] DOB: April 03, 1971 DOA: 09/21/2017  PCP: Patient, No Pcp Per Patient coming from: home  Chief Complaint: fever/cough  HPI: James [name] is a 46 y.o. male with medical history significant for hypertension, diabetes, renal failure status post renal transplant 2008, anemia of chronic disease, presents to the emergency Department chief complaint of fever cough general malaise. Initial evaluation reveals positive influenza A test, tachycardia, fever.  Information is obtained from the patient. He states he was in his usual state of health yesterday he went to dialysis and completed his session. He reports he awakened this morning with sore throat head congestion headache and cough. He reports intermittent sputum production of thick white sputum. He took his temperature and it was 102. Associated symptoms include shortness of breath diaphoresis general malaise. He reports he was admitted in October 2018 for pneumonia and he was afraid he had pneumonia again. EMS was called and they reported a temperature of 102 and he was given Tylenol and transported to the emergency department. In addition he reports he recently had a peritoneal dialysis catheter placed but is not in use. He denies dizziness syncope or near-syncope. He denies chest pain palpitations lower extremity edema. He denies abdominal pain but does endorse 1 episode of diarrhea.    ED Course: In the emergency department max temp 101, a pressure is elevated he is tachycardic with a rate of 130. Mild tachypnea oxygen saturation greater than 90% on room air. Received 1 L normal saline.  Review of Systems: As per HPI otherwise all other systems reviewed and are negative.   Ambulatory Status: Ambulates independently. Independent with ADLs lives at home with his son Past Medical History:  Diagnosis Date  . Anemia   . Diabetes mellitus without complication ([place])   . Hypertension   . Influenza  A   . Pneumonia 04/21/2017  . Renal disorder   . 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 He then got HD in [facility] until getting a renal transplant in 2009 at [place], [place].  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    . Smoker   . Tachycardia   . Transplant recipient     Past Surgical History:  Procedure Laterality Date  . CHOLECYSTECTOMY    . HIP SURGERY    . INSERTION OF DIALYSIS CATHETER Left 04/24/2017   Procedure: EXCHANGE  OF DIALYSIS CATHETER;  Surgeon: Rosetta Posner, MD;  Location: [facility];  Service: Vascular;  Laterality: Left;  . KIDNEY TRANSPLANT    . PERITONEAL CATHETER INSERTION Left 08/2017    Social History   Socioeconomic History  . Marital status: Single    Spouse name: Not on file  . Number of children: Not on file  . Years of education: Not on file  . Highest education level: Not on file  Social Needs  . Financial resource strain: Not on file  . Food insecurity - worry: Not on file  . Food insecurity - inability: Not on file  . Transportation needs - medical: Not on file  . Transportation needs - non-medical: Not on file  Occupational History  . Not  on file  Tobacco Use  . Smoking status: Former Smoker    Packs/day: 0.20  . Smokeless tobacco: Never Used  Substance and Sexual Activity  . Alcohol use: No  . Drug use: No  . Sexual activity: Not on file  Other Topics Concern  . Not on file  Social History Narrative  . Not on file    Allergies  Allergen Reactions  . Darvon [Propoxyphene] Rash    Broke out on eyelid   . Tramadol Rash    Family History  Adopted: Yes    Prior to Admission medications   Medication Sig Start Date End Date Taking? Authorizing Provider  acetaminophen  (TYLENOL) 500 MG tablet Take 500-1,000 mg by mouth every 6 (six) hours as needed for headache (pain).    [provider]  amLODipine (NORVASC) 5 MG tablet Take 1 tablet (5 mg total) by mouth daily. Patient taking differently: Take 10 mg by mouth daily.  08/16/15   Liberty Handy, MD  doxazosin (CARDURA) 2 MG tablet Take 1 mg by mouth at bedtime.    [provider]  insulin aspart (NOVOLOG FLEXPEN) 100 UNIT/ML FlexPen Inject 10 Units into the skin 3 (three) times daily with meals. 04/25/17   Mariel Aloe, MD  Insulin Detemir (LEVEMIR FLEXTOUCH) 100 UNIT/ML Pen Inject 20 Units into the skin at bedtime. 04/25/17   Mariel Aloe, MD  latanoprost (XALATAN) 0.005 % ophthalmic solution Place 1 drop into both eyes at bedtime.    [provider]  metoprolol tartrate (LOPRESSOR) 25 MG tablet Take 25 mg by mouth 2 (two) times daily.     [provider]  Multiple Vitamins-Minerals (MULTIVITAMIN WITH MINERALS) tablet Take 1 tablet by mouth daily.     [provider]  omeprazole (PRILOSEC) 20 MG capsule Take 20 mg by mouth daily.    [provider]  predniSONE (DELTASONE) 5 MG tablet Take 5 mg by mouth daily with breakfast.    [provider]  sodium bicarbonate 650 MG tablet Take 1,300 mg by mouth 3 (three) times daily.    [provider]  tacrolimus (PROGRAF) 0.5 MG capsule Take 0.5-1 mg by mouth See admin instructions. Take 2 capsules (1 mg) by mouth every morning and 1 capsule (0.5 mg) every evening    [provider]  valganciclovir (VALCYTE) 50 MG/ML SOLR Take 2 mLs (100 mg total) by mouth every dialysis. Take after hemodialysis on Monday, Wednesday and Friday. 04/25/17   Mariel Aloe, MD  zolpidem (AMBIEN) 10 MG tablet Take 10 mg by mouth at bedtime as needed for sleep.    [provider]    Physical Exam: Vitals:   09/21/17 1500 09/21/17 1515 09/21/17 1630 09/21/17 1645  BP:  (!) 159/73 (!) 186/91 (!) 170/81    Pulse: (!) 127 (!) 120 (!) 121 (!) 130  Resp: 17 (!) 24  (!) 22  Temp:      TempSrc:      SpO2: 96% 96% 95% 99%     General:  Appears acutely ill, somewhat uncomfortable only mild distress Eyes:  PERRL, EOMI, right lid swollen, iris ENT:  grossly normal hearing, lips & tongue, mucous membranes of his mouth are pink slightly dry Neck:  no LAD, masses or thyromegaly Cardiovascular:  Tachycardic but regular, no m/r/g. No LE edema. R fistula  Respiratory:  Mild increased work of breathing with conversation. Frequent intermittent wet sounding cough but nonproductive cough during exam. Breath sounds with fine crackles no wheeze  Abdomen:  soft, ntnd, obese positive bowel sounds no guarding or rebounding Skin:  no rash or induration seen on limited exam Musculoskeletal:  grossly normal tone BUE/BLE, good ROM, no bony abnormality Psychiatric:  grossly normal mood and affect, speech fluent and appropriate, AOx3 Neurologic:  CN 2-12 grossly intact, moves all extremities in coordinated fashion, sensation intact  Labs on Admission: I have personally reviewed following labs and imaging studies  CBC: Recent Labs  Lab 09/21/17 1457  WBC 4.4  NEUTROABS 3.6  HGB 9.4*  HCT 29.2*  MCV 98.6  PLT 426*   Basic Metabolic Panel: Recent Labs  Lab 09/21/17 1457  NA 139  K 4.2  CL 105  CO2 21*  GLUCOSE 104*  BUN 46*  CREATININE 8.03*  CALCIUM 8.5*   GFR: CrCl cannot be calculated (Unknown ideal weight.). Liver Function Tests: Recent Labs  Lab 09/21/17 1457  AST 31  ALT 25  ALKPHOS 131*  BILITOT 0.9  PROT 6.9  ALBUMIN 3.2*   No results for input(s): LIPASE, AMYLASE in the last 168 hours. No results for input(s): AMMONIA in the last 168 hours. Coagulation Profile: No results for input(s): INR, PROTIME in the last 168 hours. Cardiac Enzymes: No results for input(s): CKTOTAL, CKMB, CKMBINDEX, TROPONINI in the last 168 hours. BNP (last 3 results) No results for input(s): PROBNP in  the last 8760 hours. HbA1C: No results for input(s): HGBA1C in the last 72 hours. CBG: No results for input(s): GLUCAP in the last 168 hours. Lipid Profile: No results for input(s): CHOL, HDL, LDLCALC, TRIG, CHOLHDL, LDLDIRECT in the last 72 hours. Thyroid Function Tests: No results for input(s): TSH, T4TOTAL, FREET4, T3FREE, THYROIDAB in the last 72 hours. Anemia Panel: No results for input(s): VITAMINB12, FOLATE, FERRITIN, TIBC, IRON, RETICCTPCT in the last 72 hours. Urine analysis:    Component Value Date/Time   COLORURINE YELLOW 09/21/2017 [address] 09/21/2017 1415   LABSPEC 1.011 09/21/2017 1415   PHURINE 9.0 (H) 09/21/2017 1415   GLUCOSEU 50 (A) 09/21/2017 1415   HGBUR NEGATIVE 09/21/2017 1415   BILIRUBINUR NEGATIVE 09/21/2017 1415   KETONESUR NEGATIVE 09/21/2017 1415   PROTEINUR >=300 (A) 09/21/2017 1415   UROBILINOGEN 0.2 04/09/2014 2013   NITRITE NEGATIVE 09/21/2017 1415   LEUKOCYTESUR NEGATIVE 09/21/2017 1415    Creatinine Clearance: CrCl cannot be calculated (Unknown ideal weight.).  Sepsis Labs: @LABRCNTIP (procalcitonin:4,lacticidven:4) ) Recent Results (from the past 240 hour(s))  Rapid strep screen     Status: None   Collection Time: 09/21/17  1:50 PM  Result Value Ref Range Status   Streptococcus, Group A Screen (Direct) NEGATIVE NEGATIVE Final    Comment: (NOTE) A Rapid Antigen test may result negative if the antigen level in the sample is below the detection level of this test. The FDA has not cleared this test as a stand-alone test therefore the rapid antigen negative result has reflexed to a Group A Strep culture. Performed at [facility], [place] [address], [place], [place] [address]      Radiological Exams on Admission: Dg Chest 2 View  Result Date: 09/21/2017 CLINICAL DATA:  Fever, emesis and productive cough. EXAM: CHEST  2 VIEW COMPARISON:  04/24/2017; 04/19/2017; 08/13/2015; 02/22/2004 FINDINGS: Grossly unchanged cardiac  silhouette and mediastinal contours. Stable position of support apparatus. Pulmonary vasculature appears slightly indistinct with cephalization of flow. No discrete focal airspace opacities. No pleural effusion or pneumothorax. No acute osseus abnormalities. IMPRESSION: 1. Suspected mild pulmonary edema. 2. No focal airspace opacities to  suggest pneumonia. Electronically Signed   By: Sandi Mariscal M.D.   On: 09/21/2017 14:17    EKG: Sinus tachycardia Minimal ST depression, lateral leads  Assessment/Plan Principal Problem:   Influenza A Active Problems:   Renal transplant recipient / ESRD   Diabetes mellitus without complication ([place])   Hypertension   Tachycardia   HCAP (healthcare-associated pneumonia)   Fever   #1. Fever/cough and renal transplant/dialysis patient. Hospitalized October 2018  Workup reveals positive influenza A. Chest x-ray without infiltrate. He does have fever no leukocytosis lactic acid within the limits of normal mild tachypnea. He is provided with 1 L normal saline vancomycin and Zosyn in the emergency department as well as Tamiflu -Admit -Follow blood cultures -Obtain sputum culture as able -Continue Tamiflu -Supportive therapy -Notify nephrology of patient's admission  #2. Health care associated pneumonia?. He does have a fever and a cough with sputum production. Chest x-ray without infiltrate no leukocytosis lactic acid within the limits of normal. He is provided with antibiotics in the emergency department. -Obtain blood culture -Obtain sputum culture as able -Continue antibiotics per pharmacy for now -Track lactic acid -Strep pneumo urine as able  #3. Hypertension. Blood pressures the high end of normal in the emergency department. Home medications include Norvasc, Cardura, metoprolol -Continue Cardura and metoprolol -Hold Norvasc for now -Monitor  #4. Tachycardia. Heart rate 120. Likely related to #1. He has not had his home medications yet either. EKG  without acute changes no chest pain. -Resume metoprolol -Tylenol for fever -Monitor  #5. End-stage renal disease/renal transplant patient. Chart review indicates he is post renal transplant 2009. His transplant was complicated by EBV associated lymphoproliferative disorder B-cell lymphoma requiring cessation of his immunosuppressive regimen and ultimately failure this transplant. Been on dialysis ever since. Is a Monday Wednesday Friday dialysis patient he went to dialysis yesterday and completed the session. His creatinine is 8.03 which appears to be a little above his baseline. He reports he makes "little" urine -Continue home meds -Nephrology consult  #6. Diabetes. Serum glucose 104 on admission. Home medications include levemir NovoLog. Globin A1c 6.4 -Continue Levemir -slididing scale for optimal control    DVT prophylaxis: heparin  Code Status: full  Family Communication: none present  Disposition Plan: home  Consults called: [facility]  Admission status: inpatient    Radene Gunning MD [facility]  If 7PM-7AM, please contact night-coverage [email or web address] Password [facility]  09/21/2017, 5:06 PM

## 2017-09-21 NOTE — Progress Notes (Signed)
Pharmacy Antibiotic Note  James [name] is a 47 y.o. male admitted on 09/21/2017 with pneumonia.  Pharmacy has been consulted for cefepime and vancomycin dosing.  ESRD. Tmax of 101.4, WBC wnl.  Plan: Give vancomycin 2g IV x 1, then start vancomycin 1g IV QHD-MWF Give cefepime 2g IV x 1, then start cefepime 2g IV QHD-MWF Monitor clinical picture, renal function, VT prn F/U C&S, abx deescalation / LOT   Temp (24hrs), Avg:101.4 F (38.6 C), Min:101.4 F (38.6 C), Max:101.4 F (38.6 C)  No results for input(s): WBC, CREATININE, LATICACIDVEN, VANCOTROUGH, VANCOPEAK, VANCORANDOM, GENTTROUGH, GENTPEAK, GENTRANDOM, TOBRATROUGH, TOBRAPEAK, TOBRARND, AMIKACINPEAK, AMIKACINTROU, AMIKACIN in the last 168 hours.  CrCl cannot be calculated (Patient's most recent lab result is older than the maximum 21 days allowed.).    Allergies  Allergen Reactions  . Darvon [Propoxyphene] Rash    Broke out on eyelid   . Tramadol Rash    Thank you for allowing pharmacy to be a part of this patient's care.  Reginia Naas 09/21/2017 1:36 PM

## 2017-09-21 NOTE — ED Provider Notes (Signed)
[facility] EMERGENCY DEPARTMENT Provider Note   CSN: [id number] Arrival date & time: 09/21/17  1305     History   Chief Complaint Chief Complaint  Patient presents with  . Fever    HPI James [name] is a 47 y.o. male history diabetes, hypertension, ESRD on dialysis (last dialysis yesterday), presenting with fever, sore throat, trouble breathing.  Patient finished full dialysis treatment yesterday.  Patient woke up today with some neck pain and sore throat.  Denies any headaches.  Patient has some productive cough with yellow sputum.  Also started running a fever 102 at home.  Family was concerned because he was admitted for pneumonia several months ago and was worried that he may have recurrent pneumonia.  EMS was called and his temperature was 102 and was given Tylenol prior to arrival.  Of note, several months ago patient had a peritoneal dialysis catheter placed but that is not currently in use.  Denies any abdominal pain or purulent drainage from the catheter.  Patient currently gets dialyzed through his left Port-A-Cath  The history is provided by the patient.    Past Medical History:  Diagnosis Date  . Diabetes mellitus without complication ([place])   . Hypertension   . Pneumonia 04/21/2017  . Renal disorder   . 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 He then got HD in [facility] until getting a renal transplant in 2009 at [place], [place].  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    . Smoker   . Transplant recipient     Patient Active Problem List   Diagnosis Date Noted  . Fever 09/21/2017  . Influenza A 09/21/2017  . Community acquired pneumonia of right upper lobe of lung ([place])   . Transaminitis   .  Hypokalemia   . HCAP (healthcare-associated pneumonia) 04/19/2017  . Sepsis ([place])   . Sepsis due to Gram-negative organism with acute renal failure ([place]) 08/14/2015  . Acute on chronic renal failure ([place])   . Nausea vomiting and diarrhea   . Renal transplant disorder   . Diarrhea 08/13/2015  . AKI (acute kidney injury) ([place]) 08/13/2015  . Urinary tract infection 04/10/2014  . UTI (urinary tract infection) 04/09/2014  . Tachycardia 04/09/2014  . Renal transplant recipient / ESRD   . Diabetes mellitus without complication ([employer])   . Hypertension   . Smoker     Past Surgical History:  Procedure Laterality Date  . CHOLECYSTECTOMY    . HIP SURGERY    . INSERTION OF DIALYSIS CATHETER Left 04/24/2017   Procedure: EXCHANGE  OF DIALYSIS CATHETER;  Surgeon: Rosetta Posner, MD;  Location: [facility];  Service: Vascular;  Laterality: Left;  . KIDNEY TRANSPLANT    . PERITONEAL CATHETER INSERTION Left 08/2017       Home Medications    Prior to Admission medications   Medication Sig Start Date End Date Taking? Authorizing Provider  acetaminophen (TYLENOL) 500 MG tablet Take 500-1,000 mg by mouth every 6 (six) hours as needed for headache (pain).    [provider]  amLODipine (NORVASC) 5 MG tablet Take 1 tablet (5 mg total) by mouth daily. Patient taking  differently: Take 10 mg by mouth daily.  08/16/15   Liberty Handy, MD  doxazosin (CARDURA) 2 MG tablet Take 1 mg by mouth at bedtime.    [provider]  insulin aspart (NOVOLOG FLEXPEN) 100 UNIT/ML FlexPen Inject 10 Units into the skin 3 (three) times daily with meals. 04/25/17   Mariel Aloe, MD  Insulin Detemir (LEVEMIR FLEXTOUCH) 100 UNIT/ML Pen Inject 20 Units into the skin at bedtime. 04/25/17   Mariel Aloe, MD  latanoprost (XALATAN) 0.005 % ophthalmic solution Place 1 drop into both eyes at bedtime.    [provider]  metoprolol tartrate (LOPRESSOR) 25 MG tablet Take 25 mg by mouth 2 (two) times daily.      [provider]  Multiple Vitamins-Minerals (MULTIVITAMIN WITH MINERALS) tablet Take 1 tablet by mouth daily.     [provider]  omeprazole (PRILOSEC) 20 MG capsule Take 20 mg by mouth daily.    [provider]  predniSONE (DELTASONE) 5 MG tablet Take 5 mg by mouth daily with breakfast.    [provider]  sodium bicarbonate 650 MG tablet Take 1,300 mg by mouth 3 (three) times daily.    [provider]  tacrolimus (PROGRAF) 0.5 MG capsule Take 0.5-1 mg by mouth See admin instructions. Take 2 capsules (1 mg) by mouth every morning and 1 capsule (0.5 mg) every evening    [provider]  valganciclovir (VALCYTE) 50 MG/ML SOLR Take 2 mLs (100 mg total) by mouth every dialysis. Take after hemodialysis on Monday, Wednesday and Friday. 04/25/17   Mariel Aloe, MD  zolpidem (AMBIEN) 10 MG tablet Take 10 mg by mouth at bedtime as needed for sleep.    [provider]    Family History Family History  Adopted: Yes    Social History Social History   Tobacco Use  . Smoking status: Former Smoker    Packs/day: 0.20  . Smokeless tobacco: Never Used  Substance Use Topics  . Alcohol use: No  . Drug use: No     Allergies   Darvon [propoxyphene] and Tramadol   Review of Systems Review of Systems  Constitutional: Positive for fever.  Respiratory: Positive for cough.   All other systems reviewed and are negative.    Physical Exam Updated Vital Signs BP (!) 170/81   Pulse (!) 130   Temp (!) 101 F (38.3 C) (Oral)   Resp (!) 22   SpO2 99%   Physical Exam  Constitutional: He is oriented to person, place, and time.  Chronically ill   HENT:  Head: Normocephalic.  OP slightly red, no obvious tonsillar exudates   Eyes: Conjunctivae and EOM are normal. Pupils are equal, round, and reactive to light.  Neck: Normal range of motion. Neck supple.  No meningeal signs   Cardiovascular: Regular rhythm and normal heart sounds.    Tachycardic   Pulmonary/Chest:  Crackles bilateral bases   Abdominal: Soft. Bowel sounds are normal.  Peritoneal catheter with serosanguinous output, no obvious erythema around it. No obvious abdominal tenderness   Musculoskeletal:  L upper chest catheter with no obvious erythema. R fistula with good bruit with no signs of infection   Neurological: He is alert and oriented to person, place, and time.  Skin: Skin is warm.  Psychiatric: He has a normal mood and affect.  Nursing note and vitals reviewed.    ED Treatments / Results  Labs (all labs ordered are listed, but only abnormal results are displayed) Labs Reviewed  COMPREHENSIVE METABOLIC PANEL - Abnormal; Notable for the following components:      Result Value   CO2 21 (*)    Glucose, Bld 104 (*)    BUN 46 (*)    Creatinine, Ser 8.03 (*)    Calcium 8.5 (*)    Albumin 3.2 (*)    Alkaline Phosphatase 131 (*)    GFR calc non Af Amer 7 (*)    GFR calc Af Amer 8 (*)    All other components within normal limits  CBC WITH DIFFERENTIAL/PLATELET - Abnormal; Notable for the following components:   RBC 2.96 (*)    Hemoglobin 9.4 (*)    HCT 29.2 (*)    RDW 15.6 (*)    Platelets 145 (*)    Lymphs Abs 0.6 (*)    All other components within normal limits  URINALYSIS, ROUTINE W REFLEX MICROSCOPIC - Abnormal; Notable for the following components:   pH 9.0 (*)    Glucose, UA 50 (*)    Protein, ur >=300 (*)    Squamous Epithelial / LPF 0-5 (*)    All other components within normal limits  INFLUENZA PANEL BY PCR (TYPE A & B) - Abnormal; Notable for the following components:   Influenza A By PCR POSITIVE (*)    All other components within normal limits  RAPID STREP SCREEN (NOT AT [facility])  CULTURE, BLOOD (ROUTINE X 2)  CULTURE, BLOOD (ROUTINE X 2)  URINE CULTURE  CULTURE, GROUP A STREP ([place])  CULTURE, EXPECTORATED SPUTUM-ASSESSMENT  GRAM STAIN  HIV ANTIBODY (ROUTINE TESTING)  STREP PNEUMONIAE URINARY ANTIGEN  HEMOGLOBIN A1C   LACTIC ACID, PLASMA  LACTIC ACID, PLASMA  I-STAT CG4 LACTIC ACID, ED  I-STAT TROPONIN, ED    EKG  EKG Interpretation  Date/Time:  Saturday September 21 2017 13:05:46 EST Ventricular Rate:  123 PR Interval:    QRS Duration: 85 QT Interval:  301 QTC Calculation: 431 R Axis:     Text Interpretation:  Sinus tachycardia Minimal ST depression, lateral leads No significant change since last tracing Confirmed by Wandra Arthurs [phone]) on 09/21/2017 1:07:37 PM       Radiology Dg Chest 2 View  Result Date: 09/21/2017 CLINICAL DATA:  Fever, emesis and productive cough. EXAM: CHEST  2 VIEW COMPARISON:  04/24/2017; 04/19/2017; 08/13/2015; 02/22/2004 FINDINGS: Grossly unchanged cardiac silhouette and mediastinal contours. Stable position of support apparatus. Pulmonary vasculature appears slightly indistinct with cephalization of flow. No discrete focal airspace opacities. No pleural effusion or pneumothorax. No acute osseus abnormalities. IMPRESSION: 1. Suspected mild pulmonary edema. 2. No focal airspace opacities to suggest pneumonia. Electronically Signed   By: Sandi Mariscal M.D.   On: 09/21/2017 14:17    Procedures Procedures (including critical care time)  Medications Ordered in ED Medications  vancomycin (VANCOCIN) 2,000 mg in sodium chloride 0.9 % 500 mL IVPB (2,000 mg Intravenous New Bag/Given 09/21/17 1601)  vancomycin (VANCOCIN) IVPB 1000 mg/200 mL premix (not administered)  ceFEPIme (MAXIPIME) 2 g in sodium chloride 0.9 % 100 mL IVPB (not administered)  acetaminophen (TYLENOL) tablet 500 mg (0 mg Oral Hold 09/21/17 1522)  acetaminophen (TYLENOL) tablet 500-1,000 mg (not administered)  amLODipine (NORVASC) tablet 10 mg (not administered)  doxazosin (CARDURA) tablet 1 mg (not administered)  Insulin Detemir (LEVEMIR) FlexPen 20 Units (not administered)  latanoprost (XALATAN) 0.005 % ophthalmic solution 1 drop (not administered)  metoprolol tartrate (LOPRESSOR) tablet 25 mg (not administered)   pantoprazole (PROTONIX) EC tablet 40 mg (not administered)  predniSONE (DELTASONE) tablet 5  mg (not administered)  sodium bicarbonate tablet 1,300 mg (not administered)  tacrolimus (PROGRAF) capsule 0.5-1 mg (not administered)  zolpidem (AMBIEN) tablet 10 mg (not administered)  heparin injection 5,000 Units (not administered)  sodium chloride flush (NS) 0.9 % injection 3 mL (not administered)  sodium chloride flush (NS) 0.9 % injection 3 mL (not administered)  0.9 %  sodium chloride infusion (not administered)  ondansetron (ZOFRAN) tablet 4 mg (not administered)    Or  ondansetron (ZOFRAN) injection 4 mg (not administered)  insulin aspart (novoLOG) injection 0-9 Units (not administered)  insulin aspart (novoLOG) injection 0-5 Units (not administered)  sodium chloride 0.9 % bolus 1,000 mL (1,000 mLs Intravenous New Bag/Given 09/21/17 1506)  ceFEPIme (MAXIPIME) 2 g in sodium chloride 0.9 % 100 mL IVPB (0 g Intravenous Stopped 09/21/17 1540)  amLODipine (NORVASC) tablet 5 mg (5 mg Oral Given 09/21/17 1645)  metoprolol tartrate (LOPRESSOR) tablet 25 mg (25 mg Oral Given 09/21/17 1645)  oseltamivir (TAMIFLU) capsule 75 mg (75 mg Oral Given 09/21/17 1645)     Initial Impression / Assessment and Plan / ED Course  I have reviewed the triage vital signs and the nursing notes.  Pertinent labs & imaging results that were available during my care of the patient were reviewed by me and considered in my medical decision making (see chart for details).     James [name] is a 47 y.o. male here with cough, sore throat, fever. Meets SIRS criteria. Consider recurrent pneumonia vs flu vs viral syndrome vs strep pharyngitis. No meningeal sings. No abdominal tenderness to suggest SBP (he does have peritoneal catheter but that is no currently in use). No signs of dialysis catheter infection. Will get cbc, CMP, lactate, cultures, UA, CXR, flu. Will start vanc/cefepime empirically for HCAP.   4:56 PM Flu A  positive. Still tachycardic. Labs showed Cr 8, K 4.2. WBC nl, lactate nl. Given tamiflu. Ordered vanc/cefepime despite nl CXR because I think he has early pneumonia and had previous admission for HCAP. Blood cultures pending. Called nephrology to get him on schedule to be dialyzed in the hospital. Hospitalist to admit.   Final Clinical Impressions(s) / ED Diagnoses   Final diagnoses:  None    ED Discharge Orders    None       Drenda Freeze, MD 09/21/17 [phone]

## 2017-09-21 NOTE — ED Notes (Signed)
Pts son James [name] would like a phone call for any changes phone number [phone].....[phone]

## 2017-09-21 NOTE — Consult Note (Signed)
Reason for Consult:ESRD, Fevers Referring Physician: Dr. Burnett Harry Estell is an 47 y.o. male.  HPI: 47 yr male with hx DM and ESRD, HTn , Smoker, anemia, HPTH, with hx failed Renal TX (2008- 10/18), PTLD, who dialyzes at [facility]. Fistula placed 3 mon ago RUA, nonfuctional . Has L IJ PC.  Has PD cath placed about 2 wk ago, not trained.  Here with 8 h of fevers, chills, nonproductive cough, severe malaise, ST, feeling miserable. Mod SOB..  Claims full tx yest, and sugars in 100s.  No rash, CP, N, V, D. Constitutional: as above Eyes: negative Ears, nose, mouth, throat, and face: ST Respiratory: as above Cardiovascular: negative Gastrointestinal: negative Genitourinary:negative Musculoskeletal:negative Neurological: negative Endocrine: sugars as above Allergic/Immunologic: Darvon   Dialyzes at [facility] on MWF since 10/18. Primary Nephrologist [facility]. .. Access LIJ cath.  Past Medical History:  Diagnosis Date  . Anemia   . Diabetes mellitus without complication ([place])   . Hypertension   . Influenza A   . Pneumonia 04/21/2017  . Renal disorder   . 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 He then got HD in [facility] until getting a renal transplant in 2009 at [place], [place].  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    . Smoker   . Tachycardia   . Transplant recipient     Past Surgical History:  Procedure Laterality Date  . CHOLECYSTECTOMY    . HIP SURGERY    . INSERTION OF DIALYSIS CATHETER Left 04/24/2017   Procedure: EXCHANGE  OF DIALYSIS CATHETER;  Surgeon: Rosetta Posner, MD;  Location: [facility];  Service: Vascular;  Laterality: Left;  . KIDNEY TRANSPLANT    . PERITONEAL CATHETER INSERTION Left 08/2017    Family  History  Adopted: Yes    Social History:  reports that he has quit smoking. He smoked 0.20 packs per day. he has never used smokeless tobacco. He reports that he does not drink alcohol or use drugs.  Allergies:  Allergies  Allergen Reactions  . Darvon [Propoxyphene] Rash    Broke out on eyelid   . Tramadol Rash    Medications: I have reviewed the patient's current medications. Prior to Admission:  (Not in a hospital admission)  Results for orders placed or performed during the hospital encounter of 09/21/17 (from the past 48 hour(s))  Rapid strep screen     Status: None   Collection Time: 09/21/17  1:50 PM  Result Value Ref Range   Streptococcus, Group A Screen (Direct) NEGATIVE NEGATIVE    Comment: (NOTE) A Rapid Antigen test may result negative if the antigen level in the sample is below the detection level of this test. The FDA has not cleared this test as a stand-alone test therefore the rapid antigen negative result has reflexed to a Group A Strep culture. Performed at [facility], [place] [address] [place], [place] [address]   I-stat troponin, ED     Status: None   Collection Time: 09/21/17  1:51 PM  Result Value Ref Range   Troponin i, poc 0.01 0.00 - 0.08 ng/mL   Comment 3  Comment: Due to the release kinetics of cTnI, a negative result within the first hours of the onset of symptoms does not rule out myocardial infarction with certainty. If myocardial infarction is still suspected, repeat the test at appropriate intervals.   I-Stat CG4 Lactic Acid, ED     Status: None   Collection Time: 09/21/17  1:53 PM  Result Value Ref Range   Lactic Acid, Venous 1.87 0.5 - 1.9 mmol/L  Urinalysis, Routine w reflex microscopic     Status: Abnormal   Collection Time: 09/21/17  2:15 PM  Result Value Ref Range   Color, Urine YELLOW YELLOW   APPearance CLEAR CLEAR   Specific Gravity, Urine 1.011 1.005 - 1.030   pH 9.0 (H) 5.0 - 8.0   Glucose, UA 50 (A)  NEGATIVE mg/dL   Hgb urine dipstick NEGATIVE NEGATIVE   Bilirubin Urine NEGATIVE NEGATIVE   Ketones, ur NEGATIVE NEGATIVE mg/dL   Protein, ur >=300 (A) NEGATIVE mg/dL   Nitrite NEGATIVE NEGATIVE   Leukocytes, UA NEGATIVE NEGATIVE   RBC / HPF 0-5 0 - 5 RBC/hpf   WBC, UA 0-5 0 - 5 WBC/hpf   Bacteria, UA NONE SEEN NONE SEEN   Squamous Epithelial / LPF 0-5 (A) NONE SEEN   Mucus PRESENT     Comment: Performed at [facility], [address] [place], [place] [address]  Influenza panel by PCR (type A & B)     Status: Abnormal   Collection Time: 09/21/17  2:45 PM  Result Value Ref Range   Influenza A By PCR POSITIVE (A) NEGATIVE   Influenza B By PCR NEGATIVE NEGATIVE    Comment: (NOTE) The Xpert Xpress Flu assay is intended as an aid in the diagnosis of  influenza and should not be used as a sole basis for treatment.  This  assay is FDA approved for nasopharyngeal swab specimens only. Nasal  washings and aspirates are unacceptable for Xpert Xpress Flu testing.   Comprehensive metabolic panel     Status: Abnormal   Collection Time: 09/21/17  2:57 PM  Result Value Ref Range   Sodium 139 135 - 145 mmol/L   Potassium 4.2 3.5 - 5.1 mmol/L   Chloride 105 101 - 111 mmol/L   CO2 21 (L) 22 - 32 mmol/L   Glucose, Bld 104 (H) 65 - 99 mg/dL   BUN 46 (H) 6 - 20 mg/dL   Creatinine, Ser 8.03 (H) 0.61 - 1.24 mg/dL   Calcium 8.5 (L) 8.9 - 10.3 mg/dL   Total Protein 6.9 6.5 - 8.1 g/dL   Albumin 3.2 (L) 3.5 - 5.0 g/dL   AST 31 15 - 41 U/L   ALT 25 17 - 63 U/L   Alkaline Phosphatase 131 (H) 38 - 126 U/L   Total Bilirubin 0.9 0.3 - 1.2 mg/dL   GFR calc non Af Amer 7 (L) >60 mL/min   GFR calc Af Amer 8 (L) >60 mL/min    Comment: (NOTE) The eGFR has been calculated using the CKD EPI equation. This calculation has not been validated in all clinical situations. eGFR's persistently <60 mL/min signify possible Chronic Kidney Disease.    Anion gap 13 5 - 15    Comment: Performed at [facility] [address], [place], [place] [address]  CBC with Differential     Status: Abnormal   Collection Time: 09/21/17  2:57 PM  Result Value Ref Range   WBC 4.4 4.0 - 10.5 K/uL  RBC 2.96 (L) 4.22 - 5.81 MIL/uL   Hemoglobin 9.4 (L) 13.0 - 17.0 g/dL   HCT 29.2 (L) 39.0 - 52.0 %   MCV 98.6 78.0 - 100.0 fL   MCH 31.8 26.0 - 34.0 pg   MCHC 32.2 30.0 - 36.0 g/dL   RDW 15.6 (H) 11.5 - 15.5 %   Platelets 145 (L) 150 - 400 K/uL   Neutrophils Relative % 82 %   Neutro Abs 3.6 1.7 - 7.7 K/uL   Lymphocytes Relative 12 %   Lymphs Abs 0.6 (L) 0.7 - 4.0 K/uL   Monocytes Relative 5 %   Monocytes Absolute 0.2 0.1 - 1.0 K/uL   Eosinophils Relative 1 %   Eosinophils Absolute 0.1 0.0 - 0.7 K/uL   Basophils Relative 0 %   Basophils Absolute 0.0 0.0 - 0.1 K/uL    Comment: Performed at [facility] [address], [place], [place] [address]  Hemoglobin A1c     Status: Abnormal   Collection Time: 09/21/17  4:50 PM  Result Value Ref Range   Hgb A1c MFr Bld 6.4 (H) 4.8 - 5.6 %    Comment: (NOTE) Pre diabetes:          5.7%-6.4% Diabetes:              >6.4% Glycemic control for   <7.0% adults with diabetes    Mean Plasma Glucose 136.98 mg/dL    Comment: Performed at [facility] [address] [place], [place] [address]  CBG monitoring, ED     Status: None   Collection Time: 09/21/17  5:06 PM  Result Value Ref Range   Glucose-Capillary 94 65 - 99 mg/dL    Dg Chest 2 View  Result Date: 09/21/2017 CLINICAL DATA:  Fever, emesis and productive cough. EXAM: CHEST  2 VIEW COMPARISON:  04/24/2017; 04/19/2017; 08/13/2015; 02/22/2004 FINDINGS: Grossly unchanged cardiac silhouette and mediastinal contours. Stable position of support apparatus. Pulmonary vasculature appears slightly indistinct with cephalization of flow. No discrete focal airspace opacities. No pleural effusion or pneumothorax. No acute osseus abnormalities. IMPRESSION: 1. Suspected mild pulmonary edema. 2. No focal  airspace opacities to suggest pneumonia. Electronically Signed   By: Sandi Mariscal M.D.   On: 09/21/2017 14:17    ROS Blood pressure (!) 170/81, pulse (!) 130, temperature (!) 101 F (38.3 C), temperature source Oral, resp. rate (!) 22, SpO2 99 %. Physical Exam Physical Examination: General appearance - obese, mod distress Mental status - alert, oriented to person, place, and time, [place] asleep  Eyes - pupils equal and reactive, extraocular eye movements intact, funduscopic exam normal, discs flat and sharp Ears - Scarrng and reddness R TM Mouth - mucous membranes moist, pharynx normal without lesions Neck - adenopathy noted ACL,PCL,  Lymphatics - posterior cervical nodes, some ACL Chest - scattered rhonchi, decreased bs Heart - S1 and S2 normal, systolic murmur Gr 2/6 at 2nd left intercostal space Abdomen - obese, pos bs, liver down 5 cm Lap scars,. PD cath LUQ . ES clean Musculoskeletal - no joint tenderness, deformity or swelling Extremities - 1+ edema , very small Basilic AVF RUA,  LIJ PC Skin - normal coloration and turgor, no rashes, no suspicious skin lesions noted  Assessment/Plan: 1 influenza  Some resp insuffic suggests underlying lung dz or early ALI 2 ESRD: will need Hd on MON, will see if can get records then 3 Hypertension: Unfortunately given fluids so BP ^, may need more meds 4. Anemia of ESRD:  will use ESA, check Fe 5. Metabolic Bone Disease: see if recent Pth 6 Fevers high risk for bact infx with PC and Pd cath  On  BSAB 7 DM need to monitor 8 Obesity P Hd Mon, get records, esa, check Fe on Mon, BSAB, Tamiflu, cultures  Jeneen Rinks Emelda Kohlbeck 09/21/2017, 5:40 PM

## 2017-09-21 NOTE — ED Notes (Signed)
IV team at bedside 

## 2017-09-22 DIAGNOSIS — J101 Influenza due to other identified influenza virus with other respiratory manifestations: Secondary | ICD-10-CM

## 2017-09-22 LAB — BLOOD CULTURE ID PANEL (REFLEXED)
ACINETOBACTER BAUMANNII: NOT DETECTED
CANDIDA ALBICANS: NOT DETECTED
CANDIDA GLABRATA: NOT DETECTED
CANDIDA KRUSEI: NOT DETECTED
CANDIDA PARAPSILOSIS: NOT DETECTED
CANDIDA TROPICALIS: NOT DETECTED
ENTEROCOCCUS SPECIES: NOT DETECTED
ESCHERICHIA COLI: NOT DETECTED
Enterobacter cloacae complex: NOT DETECTED
Enterobacteriaceae species: NOT DETECTED
Haemophilus influenzae: NOT DETECTED
KLEBSIELLA OXYTOCA: NOT DETECTED
KLEBSIELLA PNEUMONIAE: NOT DETECTED
Listeria monocytogenes: NOT DETECTED
Neisseria meningitidis: NOT DETECTED
Proteus species: NOT DETECTED
Pseudomonas aeruginosa: NOT DETECTED
STREPTOCOCCUS PYOGENES: NOT DETECTED
Serratia marcescens: NOT DETECTED
Staphylococcus aureus (BCID): NOT DETECTED
Staphylococcus species: NOT DETECTED
Streptococcus agalactiae: NOT DETECTED
Streptococcus pneumoniae: NOT DETECTED
Streptococcus species: NOT DETECTED

## 2017-09-22 LAB — RENAL FUNCTION PANEL
Albumin: 3.2 g/dL — ABNORMAL LOW (ref 3.5–5.0)
Anion gap: 11 (ref 5–15)
BUN: 53 mg/dL — AB (ref 6–20)
CHLORIDE: 110 mmol/L (ref 101–111)
CO2: 18 mmol/L — AB (ref 22–32)
Calcium: 8.8 mg/dL — ABNORMAL LOW (ref 8.9–10.3)
Creatinine, Ser: 9.56 mg/dL — ABNORMAL HIGH (ref 0.61–1.24)
GFR calc Af Amer: 7 mL/min — ABNORMAL LOW (ref >60)
GFR, EST NON AFRICAN AMERICAN: 6 mL/min — AB (ref >60)
Glucose, Bld: 92 mg/dL (ref 65–99)
POTASSIUM: 4.6 mmol/L (ref 3.5–5.1)
Phosphorus: 3.8 mg/dL (ref 2.5–4.6)
Sodium: 139 mmol/L (ref 135–145)

## 2017-09-22 LAB — HIV ANTIBODY (ROUTINE TESTING W REFLEX): HIV SCREEN 4TH GENERATION: NONREACTIVE

## 2017-09-22 LAB — URINE CULTURE: CULTURE: NO GROWTH

## 2017-09-22 LAB — CBC
HCT: 29.6 % — ABNORMAL LOW (ref 39.0–52.0)
Hemoglobin: 9.4 g/dL — ABNORMAL LOW (ref 13.0–17.0)
MCH: 32.1 pg (ref 26.0–34.0)
MCHC: 31.8 g/dL (ref 30.0–36.0)
MCV: 101 fL — ABNORMAL HIGH (ref 78.0–100.0)
Platelets: 138 10*3/uL — ABNORMAL LOW (ref 150–400)
RBC: 2.93 MIL/uL — ABNORMAL LOW (ref 4.22–5.81)
RDW: 16 % — AB (ref 11.5–15.5)
WBC: 5.1 10*3/uL (ref 4.0–10.5)

## 2017-09-22 LAB — GLUCOSE, CAPILLARY
GLUCOSE-CAPILLARY: 93 mg/dL (ref 65–99)
GLUCOSE-CAPILLARY: 136 mg/dL — AB (ref 65–99)
Glucose-Capillary: 122 mg/dL — ABNORMAL HIGH (ref 65–99)
Glucose-Capillary: 180 mg/dL — ABNORMAL HIGH (ref 65–99)

## 2017-09-22 LAB — STREP PNEUMONIAE URINARY ANTIGEN: Strep Pneumo Urinary Antigen: NEGATIVE

## 2017-09-22 MED ORDER — INSULIN DETEMIR 100 UNIT/ML ~~LOC~~ SOLN
15.0000 [IU] | Freq: Every day | SUBCUTANEOUS | Status: DC
  Filled 2017-09-22: qty 0.15

## 2017-09-22 MED ORDER — INSULIN DETEMIR 100 UNIT/ML ~~LOC~~ SOLN: 15.0000 [IU] | Freq: Every day | SUBCUTANEOUS | Status: DC

## 2017-09-22 MED ORDER — TACROLIMUS 0.5 MG PO CAPS
0.5000 mg | ORAL_CAPSULE | Freq: Two times a day (BID) | ORAL | Status: DC
  Administered 2017-09-22 – 2017-09-23 (×2): 0.5 mg via ORAL
  Filled 2017-09-22 (×3): qty 1

## 2017-09-22 MED ORDER — OSELTAMIVIR PHOSPHATE 30 MG PO CAPS
30.0000 mg | ORAL_CAPSULE | ORAL | Status: DC
  Administered 2017-09-23: 30 mg via ORAL
  Filled 2017-09-22 (×2): qty 1

## 2017-09-22 NOTE — Progress Notes (Signed)
PROGRESS NOTE    James [name]  XLK:[id number] DOB: 08-26-1970 DOA: 09/21/2017 PCP: Patient, No Pcp Per   Brief Narrative: Matther Labell Ballengee is a 47 y.o. male with medical history significant for hypertension, diabetes, renal failure status post renal transplant 2008, anemia of chronic disease, presents to the emergency Department chief complaint of fever cough general malaise. Initial evaluation reveals positive influenza A test, tachycardia, fever.  Information is obtained from the patient. He states he was in his usual state of health yesterday he went to dialysis and completed his session. He reports he awakened this morning with sore throat head congestion headache and cough. He reports intermittent sputum production of thick white sputum. He took his temperature and it was 102   Assessment & Plan:   Principal Problem:   Influenza A Active Problems:   Renal transplant recipient / ESRD   Diabetes mellitus without complication (HCC)   Hypertension   Tachycardia   HCAP (healthcare-associated pneumonia)   Fever   1-Influenza,  Chest x ray no infiltrates. Presents with tachypnea RR 30, tachycardia.  Continue with tamiflu,  Continue with vancomycin and cefepime due to immune suppressive status and  Positive blood culture.   2-Blood culture; Gram Variable Rod.  Follow culture results.  On IV antibiotics.   3-ESRD; on HD, H/o renal transplant.  Continue with prednisone, prograf.  Nephrology consulted.  HD 3-04.  Fever, tachycardia;  Metoprolol, tylenol , support care.   HTN;  Continue with cardura, metoprolol.   DM; continue with levemir, reduce dose to 15 cbg in the 90.   Obesity.   DVT prophylaxis: Heparin.  Code Status:  Full code.  Family Communication: care discussed with patient  Disposition Plan: remain in the hospital for IV antibiotics, and treatment of flu, patient with multiples comorbidity./   Consultants:   Nephrology    Procedures:   HD.     Antimicrobials:  Cefepime 3-02  Vancomycin 3-02  Tamiflu 3-02   Subjective: He is feeling better, dyspnea improved. Still having chills.   Objective: Vitals:   09/21/17 2048 09/22/17 0213 09/22/17 0424 09/22/17 0900  BP: (!) 190/82 (!) 141/67 (!) 164/76 (!) 155/71  Pulse: (!) 122 (!) 104 (!) 107 (!) 106  Resp: (!) 28  (!) 30 (!) 22  Temp: 98.2 F (36.8 C) 98 F (36.7 C) 97.9 F (36.6 C) 98.5 F (36.9 C)  TempSrc: Oral Oral Oral Oral  SpO2: 97% 99% 96% 95%  Weight: 89 kg (196 lb 3.4 oz)     Height: 5\' 7"  (1.702 m)       Intake/Output Summary (Last 24 hours) at 09/22/2017 1309 Last data filed at 09/22/2017 0900 Gross per 24 hour  Intake 1600 ml  Output 0 ml  Net 1600 ml   Filed Weights   09/21/17 2048  Weight: 89 kg (196 lb 3.4 oz)    Examination:  General exam: Appears calm and comfortable having chills.  Respiratory system: Respiratory effort normal. bilateral ronchus.  Cardiovascular system: S1 & S2 heard, RRR. No JVD, murmurs, rubs, gallops or clicks. No pedal edema. Gastrointestinal system: Abdomen is nondistended, soft and nontender. No organomegaly or masses felt. Normal bowel sounds heard. Central nervous system: Alert and oriented. No focal neurological deficits. Extremities: Symmetric 5 x 5 power. Skin: No rashes, lesions or ulcers Psychiatry: Judgement and insight appear normal. Mood & affect appropriate.     Data Reviewed: I have personally reviewed following labs and imaging studies  CBC: Recent Labs  Lab 09/21/17  1457 09/22/17 0630  WBC 4.4 5.1  NEUTROABS 3.6  --   HGB 9.4* 9.4*  HCT 29.2* 29.6*  MCV 98.6 101.0*  PLT 145* 720*   Basic Metabolic Panel: Recent Labs  Lab 09/21/17 1457 09/22/17 0630  NA 139 139  K 4.2 4.6  CL 105 110  CO2 21* 18*  GLUCOSE 104* 92  BUN 46* 53*  CREATININE 8.03* 9.56*  CALCIUM 8.5* 8.8*  PHOS  --  3.8   GFR: Estimated Creatinine Clearance: 10.5 mL/min (A) (by C-G formula based on SCr of 9.56  mg/dL (H)). Liver Function Tests: Recent Labs  Lab 09/21/17 1457 09/22/17 0630  AST 31  --   ALT 25  --   ALKPHOS 131*  --   BILITOT 0.9  --   PROT 6.9  --   ALBUMIN 3.2* 3.2*   No results for input(s): LIPASE, AMYLASE in the last 168 hours. No results for input(s): AMMONIA in the last 168 hours. Coagulation Profile: No results for input(s): INR, PROTIME in the last 168 hours. Cardiac Enzymes: No results for input(s): CKTOTAL, CKMB, CKMBINDEX, TROPONINI in the last 168 hours. BNP (last 3 results) No results for input(s): PROBNP in the last 8760 hours. HbA1C: Recent Labs    09/21/17 1650  HGBA1C 6.4*   CBG: Recent Labs  Lab 09/21/17 1706 09/21/17 2039 09/22/17 0746 09/22/17 1202  GLUCAP 94 97 93 136*   Lipid Profile: No results for input(s): CHOL, HDL, LDLCALC, TRIG, CHOLHDL, LDLDIRECT in the last 72 hours. Thyroid Function Tests: No results for input(s): TSH, T4TOTAL, FREET4, T3FREE, THYROIDAB in the last 72 hours. Anemia Panel: No results for input(s): VITAMINB12, FOLATE, FERRITIN, TIBC, IRON, RETICCTPCT in the last 72 hours. Sepsis Labs: Recent Labs  Lab 09/21/17 1353 09/21/17 1656 09/21/17 2048  LATICACIDVEN 1.87 1.1 1.1    Recent Results (from the past 240 hour(s))  Rapid strep screen     Status: None   Collection Time: 09/21/17  1:50 PM  Result Value Ref Range Status   Streptococcus, Group A Screen (Direct) NEGATIVE NEGATIVE Final    Comment: (NOTE) A Rapid Antigen test may result negative if the antigen level in the sample is below the detection level of this test. The FDA has not cleared this test as a stand-alone test therefore the rapid antigen negative result has reflexed to a Group A Strep culture. Performed at [facility], [place] [address] [place], [place] [address]   Culture, group A strep     Status: None (Preliminary result)   Collection Time: 09/21/17  1:50 PM  Result Value Ref Range Status   Specimen Description THROAT  Final    Special Requests NONE  Final   Culture   Final    CULTURE REINCUBATED FOR BETTER GROWTH Performed at [facility], [place] [address], [place], [place] [address]    Report Status PENDING  Incomplete  Urine culture     Status: None   Collection Time: 09/21/17  2:15 PM  Result Value Ref Range Status   Specimen Description URINE, RANDOM  Final   Special Requests NONE  Final   Culture   Final    NO GROWTH Performed at [facility], [place] [address] [place], [place] [address]    Report Status 09/22/2017 FINAL  Final  Blood culture (routine x 2)     Status: Abnormal (Preliminary result)   Collection Time: 09/21/17  3:03 PM  Result Value Ref Range Status  Specimen Description BLOOD LEFT WRIST  Final   Special Requests IN PEDIATRIC BOTTLE Blood Culture adequate volume  Final   Culture  Setup Time (A)  Final    GRAM VARIABLE ROD IN PEDIATRIC BOTTLE CRITICAL RESULT CALLED TO, READ BACK BY AND VERIFIED WITH: E MARTIN,PHARMD AT 0911 09/22/17 BY L BENFIELD Performed at [facility], [place] [address], [place], [place] [address]    Culture GRAM VARIABLE ROD (A)  Final   Report Status PENDING  Incomplete  Blood Culture ID Panel (Reflexed)     Status: None   Collection Time: 09/21/17  3:03 PM  Result Value Ref Range Status   Enterococcus species NOT DETECTED NOT DETECTED Final   Listeria monocytogenes NOT DETECTED NOT DETECTED Final   Staphylococcus species NOT DETECTED NOT DETECTED Final   Staphylococcus aureus NOT DETECTED NOT DETECTED Final   Streptococcus species NOT DETECTED NOT DETECTED Final   Streptococcus agalactiae NOT DETECTED NOT DETECTED Final   Streptococcus pneumoniae NOT DETECTED NOT DETECTED Final   Streptococcus pyogenes NOT DETECTED NOT DETECTED Final   Acinetobacter baumannii NOT DETECTED NOT DETECTED Final   Enterobacteriaceae species NOT DETECTED NOT DETECTED Final   Enterobacter cloacae complex NOT DETECTED NOT DETECTED Final   Escherichia coli NOT  DETECTED NOT DETECTED Final   Klebsiella oxytoca NOT DETECTED NOT DETECTED Final   Klebsiella pneumoniae NOT DETECTED NOT DETECTED Final   Proteus species NOT DETECTED NOT DETECTED Final   Serratia marcescens NOT DETECTED NOT DETECTED Final   Haemophilus influenzae NOT DETECTED NOT DETECTED Final   Neisseria meningitidis NOT DETECTED NOT DETECTED Final   Pseudomonas aeruginosa NOT DETECTED NOT DETECTED Final   Candida albicans NOT DETECTED NOT DETECTED Final   Candida glabrata NOT DETECTED NOT DETECTED Final   Candida krusei NOT DETECTED NOT DETECTED Final   Candida parapsilosis NOT DETECTED NOT DETECTED Final   Candida tropicalis NOT DETECTED NOT DETECTED Final    Comment: Performed at [facility], [place] [address], [place], [place] [address]         Radiology Studies: Dg Chest 2 View  Result Date: 09/21/2017 CLINICAL DATA:  Fever, emesis and productive cough. EXAM: CHEST  2 VIEW COMPARISON:  04/24/2017; 04/19/2017; 08/13/2015; 02/22/2004 FINDINGS: Grossly unchanged cardiac silhouette and mediastinal contours. Stable position of support apparatus. Pulmonary vasculature appears slightly indistinct with cephalization of flow. No discrete focal airspace opacities. No pleural effusion or pneumothorax. No acute osseus abnormalities. IMPRESSION: 1. Suspected mild pulmonary edema. 2. No focal airspace opacities to suggest pneumonia. Electronically Signed   By: Sandi Mariscal M.D.   On: 09/21/2017 14:17        Scheduled Meds: . acetaminophen  500 mg Oral Once  . amLODipine  10 mg Oral QHS  . doxazosin  1 mg Oral QHS  . heparin  5,000 Units Subcutaneous Q8H  . insulin aspart  0-5 Units Subcutaneous QHS  . insulin aspart  0-9 Units Subcutaneous TID WC  . insulin detemir  20 Units Subcutaneous QHS  . latanoprost  1 drop Both Eyes QHS  . metoprolol tartrate  75 mg Oral BID  . multivitamin  1 tablet Oral QHS  . [START ON 09/23/2017] oseltamivir  30 mg Oral Q M,W,F-1800  . pantoprazole   40 mg Oral Daily  . predniSONE  5 mg Oral Q breakfast  . sodium chloride flush  3 mL Intravenous Q12H  . tacrolimus  1 mg Oral Daily   And  . tacrolimus  0.5 mg Oral  QHS   Continuous Infusions: . sodium chloride    . [START ON 09/23/2017] ceFEPime (MAXIPIME) IV    . [START ON 09/23/2017] vancomycin       LOS: 1 day    Time spent: 35 minutes.     Elmarie Shiley, MD [facility] Pager [phone]  If 7PM-7AM, please contact night-coverage [email or web address] Password [email or web address] 09/22/2017, 1:09 PM

## 2017-09-22 NOTE — Progress Notes (Signed)
Subjective: Interval History: has no complaint, feeling much better..  Objective: Vital signs in last 24 hours: Temp:  [97.9 F (36.6 C)-101.4 F (38.6 C)] 98.5 F (36.9 C) (03/03 0900) Pulse Rate:  [104-136] 106 (03/03 0900) Resp:  [17-37] 22 (03/03 0900) BP: (141-190)/(67-106) 155/71 (03/03 0900) SpO2:  [95 %-99 %] 95 % (03/03 0900) Weight:  [89 kg (196 lb 3.4 oz)] 89 kg (196 lb 3.4 oz) (03/02 2048) Weight change:   Intake/Output from previous day: 03/02 0701 - 03/03 0700 In: 1600 [IV Piggyback:1600] Out: 0  Intake/Output this shift: No intake/output data recorded.  General appearance: cooperative, no distress and moderately obese Resp: rhonchi bilaterally Chest wall: L IJ PC Cardio: S1, S2 normal and systolic murmur: systolic ejection 2/6, decrescendo at 2nd left intercostal space GI: obese, pos bs, Lap scars PD cath Extremities: AVF RUA  Lab Results: Recent Labs    09/21/17 1457 09/22/17 0630  WBC 4.4 5.1  HGB 9.4* 9.4*  HCT 29.2* 29.6*  PLT 145* 138*   BMET:  Recent Labs    09/21/17 1457 09/22/17 0630  NA 139 139  K 4.2 4.6  CL 105 110  CO2 21* 18*  GLUCOSE 104* 92  BUN 46* 53*  CREATININE 8.03* 9.56*  CALCIUM 8.5* 8.8*   No results for input(s): PTH in the last 72 hours. Iron Studies: No results for input(s): IRON, TIBC, TRANSFERRIN, FERRITIN in the last 72 hours.  Studies/Results: Dg Chest 2 View  Result Date: 09/21/2017 CLINICAL DATA:  Fever, emesis and productive cough. EXAM: CHEST  2 VIEW COMPARISON:  04/24/2017; 04/19/2017; 08/13/2015; 02/22/2004 FINDINGS: Grossly unchanged cardiac silhouette and mediastinal contours. Stable position of support apparatus. Pulmonary vasculature appears slightly indistinct with cephalization of flow. No discrete focal airspace opacities. No pleural effusion or pneumothorax. No acute osseus abnormalities. IMPRESSION: 1. Suspected mild pulmonary edema. 2. No focal airspace opacities to suggest pneumonia.  Electronically Signed   By: Sandi Mariscal M.D.   On: 09/21/2017 14:17    I have reviewed the patient's current medications.  Assessment/Plan: 1 ESRD for Hd tomorrow.  Has xs vol, acid  2 HTN lower vol 3 Anemia eSA 4 Obesity 5 PTLD 6 Flu with bronchitis better P HD ,get records. Tamiflu, BSAB   LOS: 1 day   Jeneen Rinks [place] 09/22/2017,9:44 AM

## 2017-09-22 NOTE — Progress Notes (Signed)
PHARMACY - PHYSICIAN COMMUNICATION CRITICAL VALUE ALERT - BLOOD CULTURE IDENTIFICATION (BCID)  James [name] is an 47 y.o. male who presented to [facility] on 09/21/2017 with a chief complaint of fever/cough  Assessment: 34 YOM with ESRD on antibiotics and Tamiflu for r/o HCAP and influenza. The patient is noted to be immunocompromised on transplant meds for history of renal transplant. Now with 1 of 2 blood cultures growing a gram variable rod with BCID results as none detected. This could represent a contaminant such as bacillus  Name of physician (or Provider) Contacted: [place]  Current antibiotics: Vancomycin + Cefepime + Tamiflu  Changes to prescribed antibiotics recommended:  Patient is on recommended antibiotics - No changes needed - being treated for PNA and influenza.  Results for orders placed or performed during the hospital encounter of 09/21/17  Blood Culture ID Panel (Reflexed) (Collected: 09/21/2017  3:03 PM)  Result Value Ref Range   Enterococcus species NOT DETECTED NOT DETECTED   Listeria monocytogenes NOT DETECTED NOT DETECTED   Staphylococcus species NOT DETECTED NOT DETECTED   Staphylococcus aureus NOT DETECTED NOT DETECTED   Streptococcus species NOT DETECTED NOT DETECTED   Streptococcus agalactiae NOT DETECTED NOT DETECTED   Streptococcus pneumoniae NOT DETECTED NOT DETECTED   Streptococcus pyogenes NOT DETECTED NOT DETECTED   Acinetobacter baumannii NOT DETECTED NOT DETECTED   Enterobacteriaceae species NOT DETECTED NOT DETECTED   Enterobacter cloacae complex NOT DETECTED NOT DETECTED   Escherichia coli NOT DETECTED NOT DETECTED   Klebsiella oxytoca NOT DETECTED NOT DETECTED   Klebsiella pneumoniae NOT DETECTED NOT DETECTED   Proteus species NOT DETECTED NOT DETECTED   Serratia marcescens NOT DETECTED NOT DETECTED   Haemophilus influenzae NOT DETECTED NOT DETECTED   Neisseria meningitidis NOT DETECTED NOT DETECTED   Pseudomonas aeruginosa NOT DETECTED NOT  DETECTED   Candida albicans NOT DETECTED NOT DETECTED   Candida glabrata NOT DETECTED NOT DETECTED   Candida krusei NOT DETECTED NOT DETECTED   Candida parapsilosis NOT DETECTED NOT DETECTED   Candida tropicalis NOT DETECTED NOT DETECTED    Lawson Radar 09/22/2017  9:20 AM

## 2017-09-23 DIAGNOSIS — H409 Unspecified glaucoma: Secondary | ICD-10-CM | POA: Insufficient documentation

## 2017-09-23 LAB — CBC
HEMATOCRIT: 30 % — AB (ref 39.0–52.0)
HEMOGLOBIN: 9.6 g/dL — AB (ref 13.0–17.0)
MCH: 32 pg (ref 26.0–34.0)
MCHC: 32 g/dL (ref 30.0–36.0)
MCV: 100 fL (ref 78.0–100.0)
Platelets: 150 10*3/uL (ref 150–400)
RBC: 3 MIL/uL — ABNORMAL LOW (ref 4.22–5.81)
RDW: 15.8 % — ABNORMAL HIGH (ref 11.5–15.5)
WBC: 4 10*3/uL (ref 4.0–10.5)

## 2017-09-23 LAB — RENAL FUNCTION PANEL
ANION GAP: 14 (ref 5–15)
Albumin: 3.2 g/dL — ABNORMAL LOW (ref 3.5–5.0)
BUN: 59 mg/dL — ABNORMAL HIGH (ref 6–20)
CALCIUM: 8.8 mg/dL — AB (ref 8.9–10.3)
CO2: 16 mmol/L — AB (ref 22–32)
Chloride: 111 mmol/L (ref 101–111)
Creatinine, Ser: 11.23 mg/dL — ABNORMAL HIGH (ref 0.61–1.24)
GFR calc Af Amer: 6 mL/min — ABNORMAL LOW (ref >60)
GFR calc non Af Amer: 5 mL/min — ABNORMAL LOW (ref >60)
GLUCOSE: 125 mg/dL — AB (ref 65–99)
Phosphorus: 5.9 mg/dL — ABNORMAL HIGH (ref 2.5–4.6)
Potassium: 4.5 mmol/L (ref 3.5–5.1)
Sodium: 141 mmol/L (ref 135–145)

## 2017-09-23 LAB — CULTURE, BLOOD (ROUTINE X 2): Special Requests: ADEQUATE

## 2017-09-23 LAB — MRSA PCR SCREENING: MRSA by PCR: NEGATIVE

## 2017-09-23 LAB — HEPATITIS B SURFACE ANTIGEN: Hepatitis B Surface Ag: NEGATIVE

## 2017-09-23 LAB — GLUCOSE, CAPILLARY
Glucose-Capillary: 244 mg/dL — ABNORMAL HIGH (ref 65–99)
Glucose-Capillary: 144 mg/dL — ABNORMAL HIGH (ref 65–99)

## 2017-09-23 LAB — CULTURE, GROUP A STREP (THRC)

## 2017-09-23 MED ORDER — HEPARIN SODIUM (PORCINE) 1000 UNIT/ML DIALYSIS
100.0000 [IU]/kg | INTRAMUSCULAR | Status: DC | PRN
  Filled 2017-09-23: qty 9

## 2017-09-23 MED ORDER — SODIUM CHLORIDE 0.9 % IV SOLN: 100.0000 mL | INTRAVENOUS | Status: DC | PRN

## 2017-09-23 MED ORDER — VALGANCICLOVIR HCL 450 MG PO TABS
450.0000 mg | ORAL_TABLET | ORAL | Status: DC
  Administered 2017-09-23: 450 mg via ORAL
  Filled 2017-09-23: qty 1

## 2017-09-23 MED ORDER — OSELTAMIVIR PHOSPHATE 30 MG PO CAPS: 30.0000 mg | ORAL_CAPSULE | ORAL | 0 refills | Status: DC

## 2017-09-23 MED ORDER — INSULIN DETEMIR 100 UNIT/ML FLEXPEN: 15.0000 [IU] | PEN_INJECTOR | Freq: Every day | SUBCUTANEOUS | 11 refills | Status: DC

## 2017-09-23 MED ORDER — LIDOCAINE-PRILOCAINE 2.5-2.5 % EX CREA: 1.0000 "application " | TOPICAL_CREAM | CUTANEOUS | Status: DC | PRN

## 2017-09-23 MED ORDER — HEPARIN SODIUM (PORCINE) 1000 UNIT/ML DIALYSIS: 1000.0000 [IU] | INTRAMUSCULAR | Status: DC | PRN

## 2017-09-23 MED ORDER — ALTEPLASE 2 MG IJ SOLR: 2.0000 mg | Freq: Once | INTRAMUSCULAR | Status: DC | PRN

## 2017-09-23 MED ORDER — CLONAZEPAM 0.5 MG PO TABS
0.5000 mg | ORAL_TABLET | Freq: Once | ORAL | Status: AC
  Administered 2017-09-23: 0.5 mg via ORAL
  Filled 2017-09-23: qty 1

## 2017-09-23 MED ORDER — LIDOCAINE HCL (PF) 1 % IJ SOLN: 5.0000 mL | INTRAMUSCULAR | Status: DC | PRN

## 2017-09-23 MED ORDER — PENTAFLUOROPROP-TETRAFLUOROETH EX AERO: 1.0000 "application " | INHALATION_SPRAY | CUTANEOUS | Status: DC | PRN

## 2017-09-23 NOTE — Progress Notes (Signed)
Pt signed off early with 2.25hrs left to complete HD tx d/t cramping. Pt refused to stay for an hr just to get 2 IVPB antibiotic meds. Pt is not in any acute distress. Nephrologist notified.

## 2017-09-23 NOTE — Procedures (Addendum)
Patient in HD, cramps, goal reduced He insists on signing off the machine after only 2 hours 15 minutes of TMT Wants to be transferred to [facility] "where his doctors are" Will have to take that up with admitting team K 4.5 so Raliegh Ip not an issue but w/bicarb 16. Under EDW  Jamal Maes, MD [place] [phone] Pager 09/23/2017, 9:31 AM

## 2017-09-23 NOTE — Progress Notes (Signed)
Pt will be transferred to [place]. Report given to receiving RN. Pt made aware and verbalized understanding.

## 2017-09-23 NOTE — Discharge Summary (Addendum)
Physician Discharge Summary  James [name] AQT:[id number] DOB: 1971/01/23 DOA: 09/21/2017  PCP: Patient, No Pcp Per  Admit date: 09/21/2017 Discharge date: 09/23/2017  Admitted From: Home  Disposition:  Transfer to [facility].   Recommendations for Outpatient Follow-up:  1. Continue with IV cefepime and vancomycin,  2. Follow Blood cultures results.     Discharge transfer: stable.  CODE STATUS: full code.  Diet recommendation: Heart Healthy   Brief/Interim Summary:   Brief Narrative: James [name] a 47 y.o.malewith medical history significantfor hypertension, diabetes, renal failure status post renal transplant 2008, anemia of chronic disease, presents to the emergency Department chief complaint of fever cough general malaise. Initial evaluation reveals positive influenza Atest, tachycardia, fever.  Information is obtained from the patient. He states he was in his usual state of health yesterday he went to dialysis and completed his session. He reports he awakened this morning with sore throat head congestion headache and cough. He reports intermittent sputum production of thick white sputum. He took his temperature and it was 102   Assessment & Plan:   Principal Problem:   Influenza A Active Problems:   Renal transplant recipient / ESRD   Diabetes mellitus without complication (HCC)   Hypertension   Tachycardia   HCAP (healthcare-associated pneumonia)   Fever   1-Influenza,  Chest x ray no infiltrates. Presents with tachypnea RR 30, tachycardia.  Continue with tamiflu.   Continue with vancomycin and cefepime due to immune suppressive status and  Positive blood culture.   2-Blood culture; Gram Variable Rod.  Follow culture results. Culture still pending.  On IV antibiotics.   3-ESRD; on HD, H/o renal transplant.  Continue with prednisone, prograf.  Nephrology consulted.  HD 3-04. Sign off 2 hours earlier due to cramps.  he wants to be  transfer to his Drs in [facility].   Fever, tachycardia;  Metoprolol, tylenol , support care.  Improved.   HTN;  Continue with cardura, metoprolol.   DM; continue with levemir, reduce dose to 15 cbg in the 90.   Obesity.  Cramps; received one dose of klonopin which help./    Discharge Diagnoses:  Principal Problem:   Influenza A Active Problems:   Renal transplant recipient / ESRD   Diabetes mellitus without complication ([place])   Hypertension   Tachycardia   HCAP (healthcare-associated pneumonia)   Fever    Discharge Instructions  Discharge Instructions    Diet - low sodium heart healthy   Complete by:  As directed      Allergies as of 09/23/2017      Reactions   Darvon [propoxyphene] Rash   Broke out on eyelid    Tramadol Rash      Medication List    TAKE these medications   acetaminophen 500 MG tablet Commonly known as:  TYLENOL Take 500-1,000 mg by mouth every 6 (six) hours as needed for headache (pain).   amLODipine 5 MG tablet Commonly known as:  NORVASC Take 1 tablet (5 mg total) by mouth daily. What changed:  when to take this   doxazosin 2 MG tablet Commonly known as:  CARDURA Take 1 mg by mouth at bedtime.   ferric citrate 1 GM 210 MG(Fe) tablet Commonly known as:  AURYXIA Take 210 mg by mouth daily with supper.   ferrous sulfate 325 (65 FE) MG tablet Take 325 mg by mouth 2 (two) times daily with a meal.   HYDROcodone-acetaminophen 5-325 MG tablet Commonly known as:  NORCO/VICODIN Take  1-2 tablets by mouth every 4 (four) hours as needed for moderate pain.   insulin aspart 100 UNIT/ML FlexPen Commonly known as:  NOVOLOG FLEXPEN Inject 10 Units into the skin 3 (three) times daily with meals. What changed:  how much to take   Insulin Detemir 100 UNIT/ML Pen Commonly known as:  LEVEMIR FLEXTOUCH Inject 15 Units into the skin at bedtime. What changed:  how much to take   latanoprost 0.005 % ophthalmic solution Commonly known  as:  XALATAN Place 1 drop into both eyes at bedtime.   lidocaine-prilocaine cream Commonly known as:  EMLA Apply 1 application topically See admin instructions. Apply topically Monday, Wednesday, Friday as needed for pain (30 minutes prior to dialysis)   metoprolol tartrate 25 MG tablet Commonly known as:  LOPRESSOR Take 75 mg by mouth 2 (two) times daily.   multivitamin with minerals tablet Take 1 tablet by mouth 2 (two) times daily. Centrum   omeprazole 20 MG capsule Commonly known as:  PRILOSEC Take 20 mg by mouth daily.   oseltamivir 30 MG capsule Commonly known as:  TAMIFLU Take 1 capsule (30 mg total) by mouth every Monday, Wednesday, and Friday at 6 PM.   predniSONE 5 MG tablet Commonly known as:  DELTASONE Take 5 mg by mouth daily with breakfast.   sodium bicarbonate 650 MG tablet Take 1,950 mg by mouth 3 (three) times daily.   tacrolimus 0.5 MG capsule Commonly known as:  PROGRAF Take 0.5 mg by mouth 2 (two) times daily.   valGANciclovir 450 MG tablet Commonly known as:  VALCYTE Take 450 mg by mouth See admin instructions. Take one tablet (450 mg) by mouth on Monday, Wednesday, Friday before dialysis What changed:  Another medication with the same name was removed. Continue taking this medication, and follow the directions you see here.   zolpidem 10 MG tablet Commonly known as:  AMBIEN Take 10 mg by mouth at bedtime.       Allergies  Allergen Reactions  . Darvon [Propoxyphene] Rash    Broke out on eyelid   . Tramadol Rash    Consultations:  Nephrology    Procedures/Studies: Dg Chest 2 View  Result Date: 09/21/2017 CLINICAL DATA:  Fever, emesis and productive cough. EXAM: CHEST  2 VIEW COMPARISON:  04/24/2017; 04/19/2017; 08/13/2015; 02/22/2004 FINDINGS: Grossly unchanged cardiac silhouette and mediastinal contours. Stable position of support apparatus. Pulmonary vasculature appears slightly indistinct with cephalization of flow. No discrete focal  airspace opacities. No pleural effusion or pneumothorax. No acute osseus abnormalities. IMPRESSION: 1. Suspected mild pulmonary edema. 2. No focal airspace opacities to suggest pneumonia. Electronically Signed   By: Sandi Mariscal M.D.   On: 09/21/2017 14:17        Subjective: Complaint of cramps/  Legs  Discharge Exam: Vitals:   09/23/17 0900 09/23/17 0932  BP: (!) 150/92 (!) 177/99  Pulse: (!) 115 (!) 113  Resp:  20  Temp:  98.2 F (36.8 C)  SpO2:  99%   Vitals:   09/23/17 0830 09/23/17 0845 09/23/17 0900 09/23/17 0932  BP: (!) 141/74 (!) 170/93 (!) 150/92 (!) 177/99  Pulse: (!) 116 (!) 110 (!) 115 (!) 113  Resp:    20  Temp:    98.2 F (36.8 C)  TempSrc:    Oral  SpO2:    99%  Weight:    86.3 kg (190 lb 4.1 oz)  Height:        General: Pt is alert, awake, not in acute distress  Cardiovascular: RRR, S1/S2 +, no rubs, no gallops Respiratory: CTA bilaterally, no wheezing, no rhonchi Abdominal: Soft, NT, ND, bowel sounds + Extremities: no edema, no cyanosis    The results of significant diagnostics from this hospitalization (including imaging, microbiology, ancillary and laboratory) are listed below for reference.     Microbiology: Recent Results (from the past 240 hour(s))  Rapid strep screen     Status: None   Collection Time: 09/21/17  1:50 PM  Result Value Ref Range Status   Streptococcus, Group A Screen (Direct) NEGATIVE NEGATIVE Final    Comment: (NOTE) A Rapid Antigen test may result negative if the antigen level in the sample is below the detection level of this test. The FDA has not cleared this test as a stand-alone test therefore the rapid antigen negative result has reflexed to a Group A Strep culture. Performed at [facility], [place] [address], [place], [place] [address]   Culture, group A strep     Status: None   Collection Time: 09/21/17  1:50 PM  Result Value Ref Range Status   Specimen Description THROAT  Final   Special Requests NONE   Final   Culture   Final    NO GROUP A STREP (S.PYOGENES) ISOLATED Performed at [facility], [place] [address], [place], [place] [address]    Report Status 09/23/2017 FINAL  Final  Urine culture     Status: None   Collection Time: 09/21/17  2:15 PM  Result Value Ref Range Status   Specimen Description URINE, RANDOM  Final   Special Requests NONE  Final   Culture   Final    NO GROWTH Performed at [facility], [place] [address] [place], [place] [address]    Report Status 09/22/2017 FINAL  Final  Blood culture (routine x 2)     Status: None (Preliminary result)   Collection Time: 09/21/17  2:58 PM  Result Value Ref Range Status   Specimen Description BLOOD LEFT HAND  Final   Special Requests   Final    BOTTLES DRAWN AEROBIC AND ANAEROBIC Blood Culture adequate volume   Culture   Final    NO GROWTH 2 DAYS Performed at [facility], [place] [address], [place], [place] [address]    Report Status PENDING  Incomplete  Blood culture (routine x 2)     Status: Abnormal   Collection Time: 09/21/17  3:03 PM  Result Value Ref Range Status   Specimen Description BLOOD LEFT WRIST  Final   Special Requests IN PEDIATRIC BOTTLE Blood Culture adequate volume  Final   Culture  Setup Time (A)  Final    GRAM VARIABLE ROD IN PEDIATRIC BOTTLE CRITICAL RESULT CALLED TO, READ BACK BY AND VERIFIED WITH: E MARTIN,PHARMD AT 0911 09/22/17 BY L BENFIELD    Culture (A)  Final    BACILLUS SPECIES THE SIGNIFICANCE OF ISOLATING THIS ORGANISM FROM A SINGLE SET OF BLOOD CULTURES WHEN MULTIPLE SETS ARE DRAWN IS UNCERTAIN. PLEASE NOTIFY THE MICROBIOLOGY DEPARTMENT WITHIN ONE WEEK IF SPECIATION AND SENSITIVITIES ARE REQUIRED. Performed at [facility], [place] [address], [place], [place] [address]    Report Status 09/23/2017 FINAL  Final  Blood Culture ID Panel (Reflexed)     Status: None   Collection Time: 09/21/17  3:03 PM  Result Value Ref Range Status   Enterococcus species NOT DETECTED NOT  DETECTED Final   Listeria monocytogenes NOT DETECTED NOT DETECTED Final   Staphylococcus species  NOT DETECTED NOT DETECTED Final   Staphylococcus aureus NOT DETECTED NOT DETECTED Final   Streptococcus species NOT DETECTED NOT DETECTED Final   Streptococcus agalactiae NOT DETECTED NOT DETECTED Final   Streptococcus pneumoniae NOT DETECTED NOT DETECTED Final   Streptococcus pyogenes NOT DETECTED NOT DETECTED Final   Acinetobacter baumannii NOT DETECTED NOT DETECTED Final   Enterobacteriaceae species NOT DETECTED NOT DETECTED Final   Enterobacter cloacae complex NOT DETECTED NOT DETECTED Final   Escherichia coli NOT DETECTED NOT DETECTED Final   Klebsiella oxytoca NOT DETECTED NOT DETECTED Final   Klebsiella pneumoniae NOT DETECTED NOT DETECTED Final   Proteus species NOT DETECTED NOT DETECTED Final   Serratia marcescens NOT DETECTED NOT DETECTED Final   Haemophilus influenzae NOT DETECTED NOT DETECTED Final   Neisseria meningitidis NOT DETECTED NOT DETECTED Final   Pseudomonas aeruginosa NOT DETECTED NOT DETECTED Final   Candida albicans NOT DETECTED NOT DETECTED Final   Candida glabrata NOT DETECTED NOT DETECTED Final   Candida krusei NOT DETECTED NOT DETECTED Final   Candida parapsilosis NOT DETECTED NOT DETECTED Final   Candida tropicalis NOT DETECTED NOT DETECTED Final    Comment: Performed at [facility], [place] [address], [place], [place] [address]  MRSA PCR Screening     Status: None   Collection Time: 09/23/17 10:29 AM  Result Value Ref Range Status   MRSA by PCR NEGATIVE NEGATIVE Final    Comment:        The GeneXpert MRSA Assay (FDA approved for NASAL specimens only), is one component of a comprehensive MRSA colonization surveillance program. It is not intended to diagnose MRSA infection nor to guide or monitor treatment for MRSA infections. Performed at [facility], [place] [address], [place], [place] [address]      Labs: BNP (last 3 results) Recent Labs     04/19/17 1401  BNP 49.7   Basic Metabolic Panel: Recent Labs  Lab 09/21/17 1457 09/22/17 0630 09/23/17 0557  NA 139 139 141  K 4.2 4.6 4.5  CL 105 110 111  CO2 21* 18* 16*  GLUCOSE 104* 92 125*  BUN 46* 53* 59*  CREATININE 8.03* 9.56* 11.23*  CALCIUM 8.5* 8.8* 8.8*  PHOS  --  3.8 5.9*   Liver Function Tests: Recent Labs  Lab 09/21/17 1457 09/22/17 0630 09/23/17 0557  AST 31  --   --   ALT 25  --   --   ALKPHOS 131*  --   --   BILITOT 0.9  --   --   PROT 6.9  --   --   ALBUMIN 3.2* 3.2* 3.2*   No results for input(s): LIPASE, AMYLASE in the last 168 hours. No results for input(s): AMMONIA in the last 168 hours. CBC: Recent Labs  Lab 09/21/17 1457 09/22/17 0630 09/23/17 0726  WBC 4.4 5.1 4.0  NEUTROABS 3.6  --   --   HGB 9.4* 9.4* 9.6*  HCT 29.2* 29.6* 30.0*  MCV 98.6 101.0* 100.0  PLT 145* 138* 150   Cardiac Enzymes: No results for input(s): CKTOTAL, CKMB, CKMBINDEX, TROPONINI in the last 168 hours. BNP: Invalid input(s): POCBNP CBG: Recent Labs  Lab 09/22/17 1202 09/22/17 1716 09/22/17 2019 09/23/17 1204 09/23/17 1654  GLUCAP 136* 122* 180* 244* 144*   D-Dimer No results for input(s): DDIMER in the last 72 hours. Hgb A1c Recent Labs    09/21/17 1650  HGBA1C 6.4*   Lipid Profile No results for input(s): CHOL, HDL, LDLCALC, TRIG,  CHOLHDL, LDLDIRECT in the last 72 hours. Thyroid function studies No results for input(s): TSH, T4TOTAL, T3FREE, THYROIDAB in the last 72 hours.  Invalid input(s): FREET3 Anemia work up No results for input(s): VITAMINB12, FOLATE, FERRITIN, TIBC, IRON, RETICCTPCT in the last 72 hours. Urinalysis    Component Value Date/Time   COLORURINE YELLOW 09/21/2017 1415   APPEARANCEUR CLEAR 09/21/2017 1415   LABSPEC 1.011 09/21/2017 1415   PHURINE 9.0 (H) 09/21/2017 1415   GLUCOSEU 50 (A) 09/21/2017 1415   HGBUR NEGATIVE 09/21/2017 1415   BILIRUBINUR NEGATIVE 09/21/2017 1415   KETONESUR NEGATIVE 09/21/2017 1415    PROTEINUR >=300 (A) 09/21/2017 1415   UROBILINOGEN 0.2 04/09/2014 2013   NITRITE NEGATIVE 09/21/2017 1415   LEUKOCYTESUR NEGATIVE 09/21/2017 1415   Sepsis Labs Invalid input(s): PROCALCITONIN,  WBC,  LACTICIDVEN Microbiology Recent Results (from the past 240 hour(s))  Rapid strep screen     Status: None   Collection Time: 09/21/17  1:50 PM  Result Value Ref Range Status   Streptococcus, Group A Screen (Direct) NEGATIVE NEGATIVE Final    Comment: (NOTE) A Rapid Antigen test may result negative if the antigen level in the sample is below the detection level of this test. The FDA has not cleared this test as a stand-alone test therefore the rapid antigen negative result has reflexed to a Group A Strep culture. Performed at [facility], [place] [address] [place], [place] [address]   Culture, group A strep     Status: None   Collection Time: 09/21/17  1:50 PM  Result Value Ref Range Status   Specimen Description THROAT  Final   Special Requests NONE  Final   Culture   Final    NO GROUP A STREP (S.PYOGENES) ISOLATED Performed at [facility], [place] [address], [place], [place] [address]    Report Status 09/23/2017 FINAL  Final  Urine culture     Status: None   Collection Time: 09/21/17  2:15 PM  Result Value Ref Range Status   Specimen Description URINE, RANDOM  Final   Special Requests NONE  Final   Culture   Final    NO GROWTH Performed at [facility], [place] [address], [place], [place] [address]    Report Status 09/22/2017 FINAL  Final  Blood culture (routine x 2)     Status: None (Preliminary result)   Collection Time: 09/21/17  2:58 PM  Result Value Ref Range Status   Specimen Description BLOOD LEFT HAND  Final   Special Requests   Final    BOTTLES DRAWN AEROBIC AND ANAEROBIC Blood Culture adequate volume   Culture   Final    NO GROWTH 2 DAYS Performed at [facility], [place] [address], [place], [place] [address]    Report Status PENDING   Incomplete  Blood culture (routine x 2)     Status: Abnormal   Collection Time: 09/21/17  3:03 PM  Result Value Ref Range Status   Specimen Description BLOOD LEFT WRIST  Final   Special Requests IN PEDIATRIC BOTTLE Blood Culture adequate volume  Final   Culture  Setup Time (A)  Final    GRAM VARIABLE ROD IN PEDIATRIC BOTTLE CRITICAL RESULT CALLED TO, READ BACK BY AND VERIFIED WITH: E MARTIN,PHARMD AT 0911 09/22/17 BY L BENFIELD    Culture (A)  Final    BACILLUS SPECIES THE SIGNIFICANCE OF ISOLATING THIS ORGANISM FROM A SINGLE SET OF BLOOD CULTURES WHEN MULTIPLE SETS ARE DRAWN IS  UNCERTAIN. PLEASE NOTIFY THE MICROBIOLOGY DEPARTMENT WITHIN ONE WEEK IF SPECIATION AND SENSITIVITIES ARE REQUIRED. Performed at [facility], [place] [address] [place], [place] [address]    Report Status 09/23/2017 FINAL  Final  Blood Culture ID Panel (Reflexed)     Status: None   Collection Time: 09/21/17  3:03 PM  Result Value Ref Range Status   Enterococcus species NOT DETECTED NOT DETECTED Final   Listeria monocytogenes NOT DETECTED NOT DETECTED Final   Staphylococcus species NOT DETECTED NOT DETECTED Final   Staphylococcus aureus NOT DETECTED NOT DETECTED Final   Streptococcus species NOT DETECTED NOT DETECTED Final   Streptococcus agalactiae NOT DETECTED NOT DETECTED Final   Streptococcus pneumoniae NOT DETECTED NOT DETECTED Final   Streptococcus pyogenes NOT DETECTED NOT DETECTED Final   Acinetobacter baumannii NOT DETECTED NOT DETECTED Final   Enterobacteriaceae species NOT DETECTED NOT DETECTED Final   Enterobacter cloacae complex NOT DETECTED NOT DETECTED Final   Escherichia coli NOT DETECTED NOT DETECTED Final   Klebsiella oxytoca NOT DETECTED NOT DETECTED Final   Klebsiella pneumoniae NOT DETECTED NOT DETECTED Final   Proteus species NOT DETECTED NOT DETECTED Final   Serratia marcescens NOT DETECTED NOT DETECTED Final   Haemophilus influenzae NOT DETECTED NOT DETECTED Final   Neisseria  meningitidis NOT DETECTED NOT DETECTED Final   Pseudomonas aeruginosa NOT DETECTED NOT DETECTED Final   Candida albicans NOT DETECTED NOT DETECTED Final   Candida glabrata NOT DETECTED NOT DETECTED Final   Candida krusei NOT DETECTED NOT DETECTED Final   Candida parapsilosis NOT DETECTED NOT DETECTED Final   Candida tropicalis NOT DETECTED NOT DETECTED Final    Comment: Performed at [facility], [place] [address] [place], [place] [address]  MRSA PCR Screening     Status: None   Collection Time: 09/23/17 10:29 AM  Result Value Ref Range Status   MRSA by PCR NEGATIVE NEGATIVE Final    Comment:        The GeneXpert MRSA Assay (FDA approved for NASAL specimens only), is one component of a comprehensive MRSA colonization surveillance program. It is not intended to diagnose MRSA infection nor to guide or monitor treatment for MRSA infections. Performed at [facility], [place] [address], [place], [place] [address]      Time coordinating discharge: Over 30 minutes  SIGNED:   Elmarie Shiley, MD  [facility] 09/23/2017, 5:25 PM Pager   If 7PM-7AM, please contact night-coverage [email or web address] Password [email or web address]

## 2017-09-23 NOTE — Progress Notes (Signed)
[facility] Rounding Note  Subjective/Interval History:  Seen in HD Upset that he ws cramping Admitted under EDW Signed off after 2 hours 15 minutes.  Objective Vital signs in last 24 hours: Vitals:   09/23/17 0830 09/23/17 0845 09/23/17 0900 09/23/17 0932  BP: (!) 141/74 (!) 170/93 (!) 150/92 (!) 177/99  Pulse: (!) 116 (!) 110 (!) 115 (!) 113  Resp:    20  Temp:    98.2 F (36.8 C)  TempSrc:    Oral  SpO2:    99%  Weight:    86.3 kg (190 lb 4.1 oz)  Height:       Weight change:   Intake/Output Summary (Last 24 hours) at 09/23/2017 1008 Last data filed at 09/23/2017 0932 Gross per 24 hour  Intake 702 ml  Output 393 ml  Net 309 ml   Physical Exam:  Blood pressure (!) 177/99, pulse (!) 113, temperature 98.2 F (36.8 C), temperature source Oral, resp. rate 20, height 5\' 7"  (1.702 m), weight 86.3 kg (190 lb 4.1 oz), SpO2 99 %.  Large framed AAM NAD when I saw (exiting the HD unit) Lungs clear TDC dry dressing PD cath dry dressing No edema  Recent Labs  Lab 09/21/17 1457 09/22/17 0630 09/23/17 0557  NA 139 139 141  K 4.2 4.6 4.5  CL 105 110 111  CO2 21* 18* 16*  GLUCOSE 104* 92 125*  BUN 46* 53* 59*  CREATININE 8.03* 9.56* 11.23*  CALCIUM 8.5* 8.8* 8.8*  PHOS  --  3.8 5.9*    Recent Labs  Lab 09/21/17 1457 09/22/17 0630 09/23/17 0557  AST 31  --   --   ALT 25  --   --   ALKPHOS 131*  --   --   BILITOT 0.9  --   --   PROT 6.9  --   --   ALBUMIN 3.2* 3.2* 3.2*    Recent Labs  Lab 09/21/17 1457 09/22/17 0630 09/23/17 0726  WBC 4.4 5.1 4.0  NEUTROABS 3.6  --   --   HGB 9.4* 9.4* 9.6*  HCT 29.2* 29.6* 30.0*  MCV 98.6 101.0* 100.0  PLT 145* 138* 150    Recent Labs  Lab 09/21/17 2039 09/22/17 0746 09/22/17 1202 09/22/17 1716 09/22/17 2019  GLUCAP 97 93 136* 122* 180*   Studies/Results: Dg Chest 2 View  Result Date: 09/21/2017 CLINICAL DATA:  Fever, emesis and productive cough. EXAM: CHEST  2 VIEW COMPARISON:  04/24/2017; 04/19/2017; 08/13/2015;  02/22/2004 FINDINGS: Grossly unchanged cardiac silhouette and mediastinal contours. Stable position of support apparatus. Pulmonary vasculature appears slightly indistinct with cephalization of flow. No discrete focal airspace opacities. No pleural effusion or pneumothorax. No acute osseus abnormalities. IMPRESSION: 1. Suspected mild pulmonary edema. 2. No focal airspace opacities to suggest pneumonia. Electronically Signed   By: Sandi Mariscal M.D.   On: 09/21/2017 14:17   Medications: . sodium chloride    . sodium chloride    . sodium chloride    . ceFEPime (MAXIPIME) IV    . vancomycin     . acetaminophen  500 mg Oral Once  . amLODipine  10 mg Oral QHS  . doxazosin  1 mg Oral QHS  . heparin  5,000 Units Subcutaneous Q8H  . insulin aspart  0-5 Units Subcutaneous QHS  . insulin aspart  0-9 Units Subcutaneous TID WC  . insulin detemir  15 Units Subcutaneous QHS  . latanoprost  1 drop Both Eyes QHS  . metoprolol tartrate  75  mg Oral BID  . multivitamin  1 tablet Oral QHS  . oseltamivir  30 mg Oral Q M,W,F-1800  . pantoprazole  40 mg Oral Daily  . predniSONE  5 mg Oral Q breakfast  . sodium chloride flush  3 mL Intravenous Q12H  . tacrolimus  0.5 mg Oral BID   Assessment/Recommendations:    1. ESRD - MWF at [facility]. Failed renal xpl. L TDC and new (2 weeks) PD cath. Had cramps that did not resolve on HD with reducing UF so he signed off early. K was OK. Will monitor for need prior to Wed HD. Advised that he got very little clearance with only 1/2 of a TMT today. He wants to be transferred to [facility] where his doctors are. Advised needs to discuss that with primary team. 2. Flu A Tamiflu 3. HCAP ATB's per primary service. BC+ Gm var rod - 1 of 2. Getting vanco and cefipime 4. Anemia 5. DM 6. Failed renal xpl   Jamal Maes, MD [place] [phone] pager 09/23/2017, 10:08 AM

## 2017-09-23 NOTE — Progress Notes (Signed)
PROGRESS NOTE    James [name]  VZC:[id number] DOB: 01/06/1971 DOA: 09/21/2017 PCP: Patient, No Pcp Per   Brief Narrative: James [name] is a 47 y.o. male with medical history significant for hypertension, diabetes, renal failure status post renal transplant 2008, anemia of chronic disease, presents to the emergency Department chief complaint of fever cough general malaise. Initial evaluation reveals positive influenza A test, tachycardia, fever.  Information is obtained from the patient. He states he was in his usual state of health yesterday he went to dialysis and completed his session. He reports he awakened this morning with sore throat head congestion headache and cough. He reports intermittent sputum production of thick white sputum. He took his temperature and it was 102   Assessment & Plan:   Principal Problem:   Influenza A Active Problems:   Renal transplant recipient / ESRD   Diabetes mellitus without complication (HCC)   Hypertension   Tachycardia   HCAP (healthcare-associated pneumonia)   Fever   1-Influenza,  Chest x ray no infiltrates. Presents with tachypnea RR 30, tachycardia.  Continue with tamiflu.   Continue with vancomycin and cefepime due to immune suppressive status and  Positive blood culture.   2-Blood culture; Gram Variable Rod.  Follow culture results. Culture still pending.  On IV antibiotics.   3-ESRD; on HD, H/o renal transplant.  Continue with prednisone, prograf.  Nephrology consulted.  HD 3-04. Sign off 2 hours earlier due to cramps.   Fever, tachycardia;  Metoprolol, tylenol , support care.  Improved.   HTN;  Continue with cardura, metoprolol.   DM; continue with levemir, reduce dose to 15 cbg in the 90.   Obesity.   DVT prophylaxis: Heparin.  Code Status:  Full code.  Family Communication: care discussed with patient  Disposition Plan: remain in the hospital for IV antibiotics, and treatment of flu, patient with multiples  comorbidity./   Consultants:   Nephrology    Procedures:   HD.    Antimicrobials:  Cefepime 3-02  Vancomycin 3-02  Tamiflu 3-02   Subjective: He denies cough, he didn't know that he was coughing.he feels more alert, he has been sleepy since admission.  He is complaining of cramps in his legs, started during HD, they were removing too much liquid.  [facility] wants to be [facility], [facility] Doctors are there.  [facility], they don't have bed, he will be place on waiting list.    Objective: Vitals:   09/23/17 0830 09/23/17 0845 09/23/17 0900 09/23/17 0932  BP: (!) 141/74 (!) 170/93 (!) 150/92 (!) 177/99  Pulse: (!) 116 (!) 110 (!) 115 (!) 113  Resp:    20  Temp:    98.2 F (36.8 C)  TempSrc:    Oral  SpO2:    99%  Weight:    86.3 kg (190 lb 4.1 oz)  Height:        Intake/Output Summary (Last 24 hours) at 09/23/2017 1356 Last data filed at 09/23/2017 0932 Gross per 24 hour  Intake 702 ml  Output 393 ml  Net 309 ml   Filed Weights   09/21/17 2048 09/23/17 0710 09/23/17 0932  Weight: 89 kg (196 lb 3.4 oz) 86.5 kg (190 lb 11.2 oz) 86.3 kg (190 lb 4.1 oz)    Examination:  General exam: Alert.  Respiratory system: normal respiratory effort, CTA Cardiovascular system: S 1, S 2 RRR Gastrointestinal system: BS present, soft, nt Central nervous system: non focal  Extremities: Symmetric power.  Skin: no rash  Psychiatry: mood and affect appropriate.     Data Reviewed: I have personally reviewed following labs and imaging studies  CBC: Recent Labs  Lab 09/21/17 1457 09/22/17 0630 09/23/17 0726  WBC 4.4 5.1 4.0  NEUTROABS 3.6  --   --   HGB 9.4* 9.4* 9.6*  HCT 29.2* 29.6* 30.0*  MCV 98.6 101.0* 100.0  PLT 145* 138* 161   Basic Metabolic Panel: Recent Labs  Lab 09/21/17 1457 09/22/17 0630 09/23/17 0557  NA 139 139 141  K 4.2 4.6 4.5  CL 105 110 111  CO2 21* 18* 16*  GLUCOSE 104* 92 125*  BUN 46* 53* 59*  CREATININE  8.03* 9.56* 11.23*  CALCIUM 8.5* 8.8* 8.8*  PHOS  --  3.8 5.9*   GFR: Estimated Creatinine Clearance: 8.8 mL/min (A) (by C-G formula based on SCr of 11.23 mg/dL (H)). Liver Function Tests: Recent Labs  Lab 09/21/17 1457 09/22/17 0630 09/23/17 0557  AST 31  --   --   ALT 25  --   --   ALKPHOS 131*  --   --   BILITOT 0.9  --   --   PROT 6.9  --   --   ALBUMIN 3.2* 3.2* 3.2*   No results for input(s): LIPASE, AMYLASE in the last 168 hours. No results for input(s): AMMONIA in the last 168 hours. Coagulation Profile: No results for input(s): INR, PROTIME in the last 168 hours. Cardiac Enzymes: No results for input(s): CKTOTAL, CKMB, CKMBINDEX, TROPONINI in the last 168 hours. BNP (last 3 results) No results for input(s): PROBNP in the last 8760 hours. HbA1C: Recent Labs    09/21/17 1650  HGBA1C 6.4*   CBG: Recent Labs  Lab 09/22/17 0746 09/22/17 1202 09/22/17 1716 09/22/17 2019 09/23/17 1204  GLUCAP 93 136* 122* 180* 244*   Lipid Profile: No results for input(s): CHOL, HDL, LDLCALC, TRIG, CHOLHDL, LDLDIRECT in the last 72 hours. Thyroid Function Tests: No results for input(s): TSH, T4TOTAL, FREET4, T3FREE, THYROIDAB in the last 72 hours. Anemia Panel: No results for input(s): VITAMINB12, FOLATE, FERRITIN, TIBC, IRON, RETICCTPCT in the last 72 hours. Sepsis Labs: Recent Labs  Lab 09/21/17 1353 09/21/17 1656 09/21/17 2048  LATICACIDVEN 1.87 1.1 1.1    Recent Results (from the past 240 hour(s))  Rapid strep screen     Status: None   Collection Time: 09/21/17  1:50 PM  Result Value Ref Range Status   Streptococcus, Group A Screen (Direct) NEGATIVE NEGATIVE Final    Comment: (NOTE) A Rapid Antigen test may result negative if the antigen level in the sample is below the detection level of this test. The FDA has not cleared this test as a stand-alone test therefore the rapid antigen negative result has reflexed to a Group A Strep culture. Performed at [facility], [place] [address], [place], [place] [address]   Culture, group A strep     Status: None   Collection Time: 09/21/17  1:50 PM  Result Value Ref Range Status   Specimen Description THROAT  Final   Special Requests NONE  Final   Culture   Final    NO GROUP A STREP (S.PYOGENES) ISOLATED Performed at [facility], [place] [address], [place], [place] [address]    Report Status 09/23/2017 FINAL  Final  Urine culture     Status: None   Collection Time: 09/21/17  2:15 PM  Result Value Ref Range Status  Specimen Description URINE, RANDOM  Final   Special Requests NONE  Final   Culture   Final    NO GROWTH Performed at [facility], [place] [address], [place], [place] [address]    Report Status 09/22/2017 FINAL  Final  Blood culture (routine x 2)     Status: None (Preliminary result)   Collection Time: 09/21/17  2:58 PM  Result Value Ref Range Status   Specimen Description BLOOD LEFT HAND  Final   Special Requests   Final    BOTTLES DRAWN AEROBIC AND ANAEROBIC Blood Culture adequate volume   Culture   Final    NO GROWTH 2 DAYS Performed at [facility], [place] [address], [place], [place] [address]    Report Status PENDING  Incomplete  Blood culture (routine x 2)     Status: Abnormal (Preliminary result)   Collection Time: 09/21/17  3:03 PM  Result Value Ref Range Status   Specimen Description BLOOD LEFT WRIST  Final   Special Requests IN PEDIATRIC BOTTLE Blood Culture adequate volume  Final   Culture  Setup Time (A)  Final    GRAM VARIABLE ROD IN PEDIATRIC BOTTLE CRITICAL RESULT CALLED TO, READ BACK BY AND VERIFIED WITH: E MARTIN,PHARMD AT 0911 09/22/17 BY L BENFIELD Performed at [facility], [place] [address] [place], [place] [address]    Culture GRAM VARIABLE ROD (A)  Final   Report Status PENDING  Incomplete  Blood Culture ID Panel (Reflexed)     Status: None   Collection Time: 09/21/17  3:03 PM  Result Value Ref Range Status   Enterococcus species  NOT DETECTED NOT DETECTED Final   Listeria monocytogenes NOT DETECTED NOT DETECTED Final   Staphylococcus species NOT DETECTED NOT DETECTED Final   Staphylococcus aureus NOT DETECTED NOT DETECTED Final   Streptococcus species NOT DETECTED NOT DETECTED Final   Streptococcus agalactiae NOT DETECTED NOT DETECTED Final   Streptococcus pneumoniae NOT DETECTED NOT DETECTED Final   Streptococcus pyogenes NOT DETECTED NOT DETECTED Final   Acinetobacter baumannii NOT DETECTED NOT DETECTED Final   Enterobacteriaceae species NOT DETECTED NOT DETECTED Final   Enterobacter cloacae complex NOT DETECTED NOT DETECTED Final   Escherichia coli NOT DETECTED NOT DETECTED Final   Klebsiella oxytoca NOT DETECTED NOT DETECTED Final   Klebsiella pneumoniae NOT DETECTED NOT DETECTED Final   Proteus species NOT DETECTED NOT DETECTED Final   Serratia marcescens NOT DETECTED NOT DETECTED Final   Haemophilus influenzae NOT DETECTED NOT DETECTED Final   Neisseria meningitidis NOT DETECTED NOT DETECTED Final   Pseudomonas aeruginosa NOT DETECTED NOT DETECTED Final   Candida albicans NOT DETECTED NOT DETECTED Final   Candida glabrata NOT DETECTED NOT DETECTED Final   Candida krusei NOT DETECTED NOT DETECTED Final   Candida parapsilosis NOT DETECTED NOT DETECTED Final   Candida tropicalis NOT DETECTED NOT DETECTED Final    Comment: Performed at [facility], [place] [address] [place], [place] [address]  MRSA PCR Screening     Status: None   Collection Time: 09/23/17 10:29 AM  Result Value Ref Range Status   MRSA by PCR NEGATIVE NEGATIVE Final    Comment:        The GeneXpert MRSA Assay (FDA approved for NASAL specimens only), is one component of a comprehensive MRSA colonization surveillance program. It is not intended to diagnose MRSA infection nor to guide or monitor treatment for MRSA infections. Performed at [facility], [place]  [address] [place], [place] [address]          Radiology  Studies: Dg Chest 2 View  Result Date: 09/21/2017 CLINICAL DATA:  Fever, emesis and productive cough. EXAM: CHEST  2 VIEW COMPARISON:  04/24/2017; 04/19/2017; 08/13/2015; 02/22/2004 FINDINGS: Grossly unchanged cardiac silhouette and mediastinal contours. Stable position of support apparatus. Pulmonary vasculature appears slightly indistinct with cephalization of flow. No discrete focal airspace opacities. No pleural effusion or pneumothorax. No acute osseus abnormalities. IMPRESSION: 1. Suspected mild pulmonary edema. 2. No focal airspace opacities to suggest pneumonia. Electronically Signed   By: Sandi Mariscal M.D.   On: 09/21/2017 14:17        Scheduled Meds: . acetaminophen  500 mg Oral Once  . amLODipine  10 mg Oral QHS  . doxazosin  1 mg Oral QHS  . heparin  5,000 Units Subcutaneous Q8H  . insulin aspart  0-5 Units Subcutaneous QHS  . insulin aspart  0-9 Units Subcutaneous TID WC  . insulin detemir  15 Units Subcutaneous QHS  . latanoprost  1 drop Both Eyes QHS  . metoprolol tartrate  75 mg Oral BID  . multivitamin  1 tablet Oral QHS  . oseltamivir  30 mg Oral Q M,W,F-1800  . pantoprazole  40 mg Oral Daily  . predniSONE  5 mg Oral Q breakfast  . sodium chloride flush  3 mL Intravenous Q12H  . tacrolimus  0.5 mg Oral BID  . valGANciclovir  450 mg Oral Q M,W,F-HD   Continuous Infusions: . sodium chloride    . ceFEPime (MAXIPIME) IV Stopped (09/23/17 1250)  . vancomycin Stopped (09/23/17 1351)     LOS: 2 days    Time spent: 35 minutes.     Elmarie Shiley, MD [facility] Pager [phone]  If 7PM-7AM, please contact night-coverage [email or web address] Password [email or web address] 09/23/2017, 1:56 PM

## 2017-09-26 LAB — CULTURE, BLOOD (ROUTINE X 2)
Culture: NO GROWTH
SPECIAL REQUESTS: ADEQUATE

## 2017-11-16 ENCOUNTER — Emergency Department (HOSPITAL_COMMUNITY): Payer: Medicare Other

## 2017-11-16 ENCOUNTER — Emergency Department (HOSPITAL_COMMUNITY)
Admission: EM | Admit: 2017-11-16 | Discharge: 2017-11-16 | Disposition: A | Discharge status: Home / Self Care | Payer: Medicare Other | Attending: Emergency Medicine | Admitting: Emergency Medicine

## 2017-11-16 ENCOUNTER — Encounter (HOSPITAL_COMMUNITY): Payer: Self-pay

## 2017-11-16 DIAGNOSIS — I120 Hypertensive chronic kidney disease with stage 5 chronic kidney disease or end stage renal disease: Secondary | ICD-10-CM | POA: Insufficient documentation

## 2017-11-16 DIAGNOSIS — Y929 Unspecified place or not applicable: Secondary | ICD-10-CM | POA: Diagnosis not present

## 2017-11-16 DIAGNOSIS — Z940 Kidney transplant status: Secondary | ICD-10-CM | POA: Insufficient documentation

## 2017-11-16 DIAGNOSIS — Z79899 Other long term (current) drug therapy: Secondary | ICD-10-CM | POA: Insufficient documentation

## 2017-11-16 DIAGNOSIS — Y999 Unspecified external cause status: Secondary | ICD-10-CM | POA: Diagnosis not present

## 2017-11-16 DIAGNOSIS — W010XXA Fall on same level from slipping, tripping and stumbling without subsequent striking against object, initial encounter: Secondary | ICD-10-CM | POA: Diagnosis not present

## 2017-11-16 DIAGNOSIS — Z794 Long term (current) use of insulin: Secondary | ICD-10-CM | POA: Diagnosis not present

## 2017-11-16 DIAGNOSIS — S59901A Unspecified injury of right elbow, initial encounter: Secondary | ICD-10-CM | POA: Diagnosis present

## 2017-11-16 DIAGNOSIS — S42491A Other displaced fracture of lower end of right humerus, initial encounter for closed fracture: Secondary | ICD-10-CM | POA: Diagnosis not present

## 2017-11-16 DIAGNOSIS — E119 Type 2 diabetes mellitus without complications: Secondary | ICD-10-CM | POA: Insufficient documentation

## 2017-11-16 DIAGNOSIS — N186 End stage renal disease: Secondary | ICD-10-CM | POA: Insufficient documentation

## 2017-11-16 DIAGNOSIS — Z87891 Personal history of nicotine dependence: Secondary | ICD-10-CM | POA: Diagnosis not present

## 2017-11-16 DIAGNOSIS — Y9389 Activity, other specified: Secondary | ICD-10-CM | POA: Insufficient documentation

## 2017-11-16 MED ORDER — ONDANSETRON 4 MG PO TBDP
4.0000 mg | ORAL_TABLET | Freq: Once | ORAL | Status: AC
  Administered 2017-11-16: 4 mg via ORAL
  Filled 2017-11-16: qty 1

## 2017-11-16 MED ORDER — HYDROCODONE-ACETAMINOPHEN 5-325 MG PO TABS: 1.0000 | ORAL_TABLET | Freq: Four times a day (QID) | ORAL | 0 refills | Status: DC | PRN

## 2017-11-16 MED ORDER — HYDROCODONE-ACETAMINOPHEN 5-325 MG PO TABS
1.0000 | ORAL_TABLET | Freq: Once | ORAL | Status: AC
  Administered 2017-11-16: 1 via ORAL
  Filled 2017-11-16: qty 1

## 2017-11-16 MED ORDER — FENTANYL CITRATE (PF) 100 MCG/2ML IJ SOLN
50.0000 ug | Freq: Once | INTRAMUSCULAR | Status: AC
  Administered 2017-11-16: 50 ug via INTRAMUSCULAR
  Filled 2017-11-16: qty 2

## 2017-11-16 NOTE — ED Notes (Signed)
Patient transported to X-ray 

## 2017-11-16 NOTE — ED Triage Notes (Addendum)
Pt arrives EMS with complaints of pain from right elbow to right shoulder. 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 PT has good pulses in right arm. PT states he was playing water guns with grand kids outside when he slipped in mud.   128/-  Hr 90 cbg 150 rr 16

## 2017-11-16 NOTE — Discharge Instructions (Signed)
As discussed, you will need to follow-up with Dr. Stann Mainland in clinic on Monday morning by giving them a call to schedule an appointment. Follow-up with your primary care provider as needed. Return to the emergency department if symptoms worsen, loss of sensation, pallor or coolness in your extremity or any other new concerning symptoms in the meantime.

## 2017-11-16 NOTE — ED Provider Notes (Signed)
[place] EMERGENCY DEPARTMENT Provider Note   CSN: [id number] Arrival date & time: 11/16/17  1658     History   Chief Complaint No chief complaint on file.   HPI James [name] is a 47 y.o. 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  States that he slipped in the mud and fell directly on his right elbow.  He denies any head trauma or loss of consciousness.  Initially did not notice any other injuries but as he waited in the emergency department, he noticed that his right toes were hurting him.  He denies any headache, visual disturbances, dizziness, nausea, vomiting, neck pain, back pain or other symptoms.  No anticoagulant use.  HPI  Past Medical History:  Diagnosis Date  . Anemia   . Diabetes mellitus without complication ([place])   . Hypertension   . Influenza A   . Pneumonia 04/21/2017  . Renal disorder   . 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 He then got HD in [facility] until getting a renal transplant in 2009 at [place], [place].  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    . Smoker   . Tachycardia   . Transplant recipient     Patient Active Problem List   Diagnosis Date Noted  . Fever 09/21/2017  . Influenza A 09/21/2017  . Anemia   . Community acquired pneumonia of right upper lobe of lung ([place])   . Transaminitis   . Hypokalemia   . HCAP (healthcare-associated pneumonia) 04/19/2017  . Sepsis ([place])   . Sepsis due to Gram-negative organism with acute  renal failure ([place]) 08/14/2015  . Acute on chronic renal failure ([place])   . Nausea vomiting and diarrhea   . Renal transplant disorder   . Diarrhea 08/13/2015  . AKI (acute kidney injury) ([place]) 08/13/2015  . Urinary tract infection 04/10/2014  . UTI (urinary tract infection) 04/09/2014  . Tachycardia 04/09/2014  . Renal transplant recipient / ESRD   . Diabetes mellitus without complication ([place])   . Hypertension   . Smoker     Past Surgical History:  Procedure Laterality Date  . CHOLECYSTECTOMY    . HIP SURGERY    . INSERTION OF DIALYSIS CATHETER Left 04/24/2017   Procedure: EXCHANGE  OF DIALYSIS CATHETER;  Surgeon: Rosetta Posner, MD;  Location: [place];  Service: Vascular;  Laterality: Left;  . KIDNEY TRANSPLANT    . PERITONEAL CATHETER INSERTION Left 08/2017        Home Medications    Prior to Admission medications   Medication Sig Start Date End Date Taking? Authorizing Provider  acetaminophen (TYLENOL) 500 MG tablet Take 500-1,000 mg by mouth every 6 (six) hours as needed for headache (pain).    [provider]  amLODipine (NORVASC) 5 MG tablet Take 1 tablet (5 mg total) by mouth daily. Patient taking differently: Take 5 mg by mouth 2 (two) times daily.  08/16/15   Liberty Handy, MD  doxazosin (CARDURA) 2 MG tablet Take 1 mg by mouth at bedtime.    [provider]  ferric citrate (AURYXIA) 1 GM 210 MG(Fe) tablet Take 210 mg by mouth daily with supper.    [provider]  ferrous sulfate 325 (65 FE) MG tablet Take 325 mg by mouth 2 (two) times daily with a meal.    [provider]  HYDROcodone-acetaminophen (NORCO/VICODIN) 5-325 MG tablet Take 1-2 tablets by mouth every 6 (six) hours as needed for moderate pain or severe pain. 11/16/17   Avie Echevaria B, PA-C  insulin aspart (NOVOLOG FLEXPEN) 100 UNIT/ML FlexPen Inject 10 Units into the skin 3 (three) times daily with meals. Patient taking differently: Inject 15 Units into the skin 3 (three)  times daily with meals.  04/25/17   Mariel Aloe, MD  Insulin Detemir (LEVEMIR FLEXTOUCH) 100 UNIT/ML Pen Inject 15 Units into the skin at bedtime. 09/23/17   Regalado, Belkys A, MD  latanoprost (XALATAN) 0.005 % ophthalmic solution Place 1 drop into both eyes at bedtime.    [provider]  lidocaine-prilocaine (EMLA) cream Apply 1 application topically See admin instructions. Apply topically Monday, Wednesday, Friday as needed for pain (30 minutes prior to dialysis) 08/27/17   [provider]  metoprolol tartrate (LOPRESSOR) 25 MG tablet Take 75 mg by mouth 2 (two) times daily.     [provider]  Multiple Vitamins-Minerals (MULTIVITAMIN WITH MINERALS) tablet Take 1 tablet by mouth 2 (two) times daily. Centrum    [provider]  omeprazole (PRILOSEC) 20 MG capsule Take 20 mg by mouth daily.    [provider]  oseltamivir (TAMIFLU) 30 MG capsule Take 1 capsule (30 mg total) by mouth every Monday, Wednesday, and Friday at 6 PM. 09/23/17   Regalado, Belkys A, MD  predniSONE (DELTASONE) 5 MG tablet Take 5 mg by mouth daily with breakfast.    [provider]  sodium bicarbonate 650 MG tablet Take 1,950 mg by mouth 3 (three) times daily.     [provider]  tacrolimus (PROGRAF) 0.5 MG capsule Take 0.5 mg by mouth 2 (two) times daily.     [provider]  valGANciclovir (VALCYTE) 450 MG tablet Take 450 mg by mouth See admin instructions. Take one tablet (450 mg) by mouth on Monday, Wednesday, Friday before dialysis 08/22/17   [provider]  zolpidem (AMBIEN) 10 MG tablet Take 10 mg by mouth at bedtime.     [provider]    Family History Family History  Adopted: Yes    Social History Social History   Tobacco Use  . Smoking status: Former Smoker    Packs/day: 0.20  . Smokeless tobacco: Never Used  Substance Use Topics  . Alcohol use: No  . Drug use: No     Allergies   Darvon [propoxyphene] and  Tramadol   Review of Systems Review of Systems  Constitutional: Negative for chills and fever.  HENT: Negative for facial swelling.   Eyes: Negative for photophobia, pain, redness and visual disturbance.  Respiratory: Negative for cough, choking, chest tightness, shortness of breath, wheezing and stridor.   Cardiovascular: Negative for chest pain, palpitations and leg swelling.  Gastrointestinal: Negative for abdominal distention, abdominal pain, nausea and vomiting.  Musculoskeletal: Positive for arthralgias and joint swelling. Negative for back pain, myalgias, neck pain and neck stiffness.  Skin: Negative for color change, pallor, rash and wound.  Neurological: Negative for dizziness, seizures, syncope, weakness, light-headedness, numbness and headaches.  Physical Exam Updated Vital Signs BP (!) 155/93 (BP Location: Left Arm)   Pulse 97   Temp 97.9 F (36.6 C) (Oral)   Resp 19   Ht 5\' 6"  (1.676 m)   Wt 86.6 kg (191 lb)   SpO2 98%   BMI 30.83 kg/m   Physical Exam  Constitutional: He appears well-developed and well-nourished. No distress.  Afebrile, nontoxic-appearing, sitting in chair in apparent discomfort holding onto his right forearm.  HENT:  Head: Normocephalic and atraumatic.  Eyes: Conjunctivae are normal.  Neck: Normal range of motion. Neck supple.  No midline tenderness palpation of the cervical spine.  Full range of motion without pain.  Cardiovascular: Normal rate, regular rhythm, normal heart sounds and intact distal pulses.  Fistula of the right antecubital region with strong thrill and bruit  Pulmonary/Chest: Effort normal and breath sounds normal. No stridor. No respiratory distress. He has no wheezes. He has no rales.  Musculoskeletal: He exhibits edema and tenderness.  No midline tenderness palpation of the spine. Loss of range of motion at the elbow, edema and tenderness to palpation.  No open wounds, abrasions or tenting skin.  Neurological: He is  alert. No sensory deficit. He exhibits normal muscle tone.  Patient with slightly reduced grip on the right due to pain in the elbow.  Sensation intact, radial pulses intact.  Neurovascularly intact.  Skin: Skin is warm and dry. No rash noted. He is not diaphoretic. No erythema. No pallor.  Psychiatric: He has a normal mood and affect.  Nursing note and vitals reviewed.    ED Treatments / Results  Labs (all labs ordered are listed, but only abnormal results are displayed) Labs Reviewed - No data to display  EKG None  Radiology Dg Elbow Complete Right  Result Date: 11/16/2017 CLINICAL DATA:  Right elbow fracture. EXAM: RIGHT ELBOW - COMPLETE 3+ VIEW COMPARISON:  Right humerus x-rays from same day. FINDINGS: Again seen is a mildly displaced bicondylar fracture of the distal humerus with extension into the elbow joint. No dislocation. Large joint effusion. The bones are osteopenic. IMPRESSION: Mildly displaced bicondylar fracture of the distal humerus. No dislocation. Electronically Signed   By: Titus Dubin M.D.   On: 11/16/2017 19:38   Dg Humerus Right  Result Date: 11/16/2017 CLINICAL DATA:  Right arm pain. EXAM: RIGHT HUMERUS - 2+ VIEW COMPARISON:  None. FINDINGS: Incompletely visualized fracture of the distal humerus. Overlying soft tissue swelling. Surgical clips within the distal humeral soft tissues. IMPRESSION: There is an incompletely visualized/characterized transverse fracture through the distal humerus. Recommend dedicated radiographs of the elbow for further evaluation. Electronically Signed   By: Lovey Newcomer M.D.   On: 11/16/2017 17:58   Dg Foot Complete Right  Result Date: 11/16/2017 CLINICAL DATA:  Initial evaluation for acute tenderness to palpation at the third and fourth digit status post fall. EXAM: RIGHT FOOT COMPLETE - 3+ VIEW COMPARISON:  None. FINDINGS: Bones are diffusely osteopenic, somewhat limiting evaluation for possible subtle acute nondisplaced fractures. No  acute fracture or dislocation. Moderate degenerative osteoarthritic changes present at the first MTP joint. Joint spaces otherwise maintained. No acute soft tissue abnormality. Extensive vascular calcifications present throughout the foot. IMPRESSION: 1. No acute osseous abnormality. 2. 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 3. Osteopenia. Electronically Signed   By: Jeannine Boga M.D.   On: 11/16/2017 21:14    Procedures Procedures (including critical care time)  SPLINT APPLICATION Date/Time: 19:37 PM Authorized by: Emeline General Consent: Verbal consent  obtained. Risks and benefits: risks, benefits and alternatives were discussed Consent given by: patient Splint applied by: orthopedic technician Location details: right arm Splint type: posterior long arm splint Supplies used: posterior lon arm Post-procedure: The splinted body part was neurovascularly unchanged following the procedure. Patient tolerance: Patient tolerated the procedure well with no immediate complications.  Medications Ordered in ED Medications  fentaNYL (SUBLIMAZE) injection 50 mcg (50 mcg Intramuscular Given 11/16/17 1835)  ondansetron (ZOFRAN-ODT) disintegrating tablet 4 mg (4 mg Oral Given 11/16/17 1836)  HYDROcodone-acetaminophen (NORCO/VICODIN) 5-325 MG per tablet 1 tablet (1 tablet Oral Given 11/16/17 2042)     Initial Impression / Assessment and Plan / ED Course  I have reviewed the triage vital signs and the nursing notes.  Pertinent labs & imaging results that were available during my care of the patient were reviewed by me and considered in my medical decision making (see chart for details).    Patient presenting after a slip and fall directly on his right elbow and sustained a bicondylar distal humeral fracture.  Patient is neurovascularly intact.   Consulted orthopedic and spoke to Dr. Stann Mainland who reviewed films and recommends posterior long arm splint and  CT  imaging of the elbow. He will see him in clinic next week.   Patient's pain was managed while in the emergency department. Patient later reported pain of the right toes.  Plain films were negative for any fractures or acute abnormalities.  Patient's right arm was splinted using a posterior long-arm splint.  Will discharge patient home with symptomatic relief and close follow-up with orthopedics.  Discussed strict return precautions and advised to return to the emergency department if experiencing any new or worsening symptoms. Instructions were understood and patient agreed with discharge plan.  Final Clinical Impressions(s) / ED Diagnoses   Final diagnoses:  Other closed displaced fracture of distal end of right humerus, initial encounter    ED Discharge Orders        Ordered    HYDROcodone-acetaminophen (NORCO/VICODIN) 5-325 MG tablet  Every 6 hours PRN     11/16/17 2242       Emeline General, PA-C 11/16/17 2248    Orlie Dakin, MD 11/17/17 [phone]

## 2017-11-16 NOTE — Progress Notes (Signed)
Orthopedic Tech Progress Note Patient Details:  James [name] [facility] [birth year] [id number]  Ortho Devices Type of Ortho Device: Ace wrap, Post (long arm) splint, Arm sling Ortho Device/Splint Location: RUE Ortho Device/Splint Interventions: Ordered, Application   Post Interventions Patient Tolerated: Well Instructions Provided: Care of device   [name] 11/16/2017, 10:00 PM

## 2017-11-16 NOTE — ED Notes (Signed)
Went to DC pt and pt had left without getting DC papers. EDP aware

## 2018-09-26 DIAGNOSIS — D696 Thrombocytopenia, unspecified: Secondary | ICD-10-CM | POA: Insufficient documentation

## 2018-10-15 DIAGNOSIS — C61 Malignant neoplasm of prostate: Secondary | ICD-10-CM | POA: Insufficient documentation

## 2019-03-13 ENCOUNTER — Inpatient Hospital Stay (HOSPITAL_COMMUNITY)
Admission: EM | Admit: 2019-03-13 | Discharge: 2019-03-17 | DRG: 867 | Disposition: A | Discharge status: Home / Self Care | Payer: Medicare Other | Attending: Internal Medicine | Admitting: Internal Medicine

## 2019-03-13 DIAGNOSIS — T8029XA Infection following other infusion, transfusion and therapeutic injection, initial encounter: Secondary | ICD-10-CM | POA: Diagnosis not present

## 2019-03-13 DIAGNOSIS — Z794 Long term (current) use of insulin: Secondary | ICD-10-CM

## 2019-03-13 DIAGNOSIS — E1122 Type 2 diabetes mellitus with diabetic chronic kidney disease: Secondary | ICD-10-CM | POA: Diagnosis present

## 2019-03-13 DIAGNOSIS — I120 Hypertensive chronic kidney disease with stage 5 chronic kidney disease or end stage renal disease: Secondary | ICD-10-CM | POA: Diagnosis present

## 2019-03-13 DIAGNOSIS — Z20828 Contact with and (suspected) exposure to other viral communicable diseases: Secondary | ICD-10-CM | POA: Diagnosis present

## 2019-03-13 DIAGNOSIS — T8612 Kidney transplant failure: Secondary | ICD-10-CM | POA: Diagnosis present

## 2019-03-13 DIAGNOSIS — N2581 Secondary hyperparathyroidism of renal origin: Secondary | ICD-10-CM | POA: Diagnosis present

## 2019-03-13 DIAGNOSIS — N186 End stage renal disease: Secondary | ICD-10-CM | POA: Diagnosis not present

## 2019-03-13 DIAGNOSIS — F419 Anxiety disorder, unspecified: Secondary | ICD-10-CM | POA: Diagnosis present

## 2019-03-13 DIAGNOSIS — Z992 Dependence on renal dialysis: Secondary | ICD-10-CM

## 2019-03-13 DIAGNOSIS — C61 Malignant neoplasm of prostate: Secondary | ICD-10-CM | POA: Diagnosis present

## 2019-03-13 DIAGNOSIS — Y830 Surgical operation with transplant of whole organ as the cause of abnormal reaction of the patient, or of later complication, without mention of misadventure at the time of the procedure: Secondary | ICD-10-CM | POA: Diagnosis present

## 2019-03-13 DIAGNOSIS — Z79899 Other long term (current) drug therapy: Secondary | ICD-10-CM

## 2019-03-13 DIAGNOSIS — K659 Peritonitis, unspecified: Secondary | ICD-10-CM | POA: Diagnosis present

## 2019-03-13 DIAGNOSIS — T8571XA Infection and inflammatory reaction due to peritoneal dialysis catheter, initial encounter: Secondary | ICD-10-CM | POA: Diagnosis present

## 2019-03-13 DIAGNOSIS — D631 Anemia in chronic kidney disease: Secondary | ICD-10-CM | POA: Diagnosis present

## 2019-03-13 DIAGNOSIS — Y841 Kidney dialysis as the cause of abnormal reaction of the patient, or of later complication, without mention of misadventure at the time of the procedure: Secondary | ICD-10-CM | POA: Diagnosis present

## 2019-03-13 DIAGNOSIS — Z87891 Personal history of nicotine dependence: Secondary | ICD-10-CM

## 2019-03-13 DIAGNOSIS — Z7952 Long term (current) use of systemic steroids: Secondary | ICD-10-CM

## 2019-03-14 ENCOUNTER — Emergency Department (HOSPITAL_COMMUNITY): Payer: Medicare Other

## 2019-03-14 ENCOUNTER — Other Ambulatory Visit: Payer: Self-pay

## 2019-03-14 ENCOUNTER — Encounter (HOSPITAL_COMMUNITY): Payer: Self-pay

## 2019-03-14 DIAGNOSIS — Y841 Kidney dialysis as the cause of abnormal reaction of the patient, or of later complication, without mention of misadventure at the time of the procedure: Secondary | ICD-10-CM | POA: Diagnosis present

## 2019-03-14 DIAGNOSIS — D649 Anemia, unspecified: Secondary | ICD-10-CM

## 2019-03-14 DIAGNOSIS — K652 Spontaneous bacterial peritonitis: Secondary | ICD-10-CM | POA: Diagnosis not present

## 2019-03-14 DIAGNOSIS — C61 Malignant neoplasm of prostate: Secondary | ICD-10-CM

## 2019-03-14 DIAGNOSIS — Z940 Kidney transplant status: Secondary | ICD-10-CM

## 2019-03-14 DIAGNOSIS — Z885 Allergy status to narcotic agent status: Secondary | ICD-10-CM

## 2019-03-14 DIAGNOSIS — K658 Other peritonitis: Secondary | ICD-10-CM

## 2019-03-14 DIAGNOSIS — I120 Hypertensive chronic kidney disease with stage 5 chronic kidney disease or end stage renal disease: Secondary | ICD-10-CM | POA: Diagnosis present

## 2019-03-14 DIAGNOSIS — N2581 Secondary hyperparathyroidism of renal origin: Secondary | ICD-10-CM

## 2019-03-14 DIAGNOSIS — T8029XA Infection following other infusion, transfusion and therapeutic injection, initial encounter: Secondary | ICD-10-CM | POA: Diagnosis present

## 2019-03-14 DIAGNOSIS — K659 Peritonitis, unspecified: Secondary | ICD-10-CM | POA: Diagnosis present

## 2019-03-14 DIAGNOSIS — Z87891 Personal history of nicotine dependence: Secondary | ICD-10-CM

## 2019-03-14 DIAGNOSIS — N186 End stage renal disease: Secondary | ICD-10-CM

## 2019-03-14 DIAGNOSIS — Z79899 Other long term (current) drug therapy: Secondary | ICD-10-CM | POA: Diagnosis not present

## 2019-03-14 DIAGNOSIS — Y830 Surgical operation with transplant of whole organ as the cause of abnormal reaction of the patient, or of later complication, without mention of misadventure at the time of the procedure: Secondary | ICD-10-CM | POA: Diagnosis present

## 2019-03-14 DIAGNOSIS — Z7952 Long term (current) use of systemic steroids: Secondary | ICD-10-CM | POA: Diagnosis not present

## 2019-03-14 DIAGNOSIS — Z20828 Contact with and (suspected) exposure to other viral communicable diseases: Secondary | ICD-10-CM | POA: Diagnosis present

## 2019-03-14 DIAGNOSIS — E1122 Type 2 diabetes mellitus with diabetic chronic kidney disease: Secondary | ICD-10-CM | POA: Diagnosis present

## 2019-03-14 DIAGNOSIS — R011 Cardiac murmur, unspecified: Secondary | ICD-10-CM

## 2019-03-14 DIAGNOSIS — Z794 Long term (current) use of insulin: Secondary | ICD-10-CM | POA: Diagnosis not present

## 2019-03-14 DIAGNOSIS — T8571XA Infection and inflammatory reaction due to peritoneal dialysis catheter, initial encounter: Secondary | ICD-10-CM | POA: Diagnosis not present

## 2019-03-14 DIAGNOSIS — Z992 Dependence on renal dialysis: Secondary | ICD-10-CM | POA: Diagnosis not present

## 2019-03-14 DIAGNOSIS — F419 Anxiety disorder, unspecified: Secondary | ICD-10-CM

## 2019-03-14 DIAGNOSIS — D631 Anemia in chronic kidney disease: Secondary | ICD-10-CM | POA: Diagnosis present

## 2019-03-14 DIAGNOSIS — T8612 Kidney transplant failure: Secondary | ICD-10-CM | POA: Diagnosis present

## 2019-03-14 LAB — URINALYSIS, ROUTINE W REFLEX MICROSCOPIC
Bilirubin Urine: NEGATIVE
Glucose, UA: NEGATIVE mg/dL
Hgb urine dipstick: NEGATIVE
Ketones, ur: NEGATIVE mg/dL
Leukocytes,Ua: NEGATIVE
Nitrite: NEGATIVE
Protein, ur: >=300 mg/dL — AB
Specific Gravity, Urine: 1.014 (ref 1.005–1.030)
pH: 8 (ref 5.0–8.0)

## 2019-03-14 LAB — COMPREHENSIVE METABOLIC PANEL
ALT: 18 U/L (ref 0–44)
AST: 17 U/L (ref 15–41)
Albumin: 3.1 g/dL — ABNORMAL LOW (ref 3.5–5.0)
Alkaline Phosphatase: 67 U/L (ref 38–126)
Anion gap: 17 — ABNORMAL HIGH (ref 5–15)
BUN: 41 mg/dL — ABNORMAL HIGH (ref 6–20)
CO2: 28 mmol/L (ref 22–32)
Calcium: 9.6 mg/dL (ref 8.9–10.3)
Chloride: 91 mmol/L — ABNORMAL LOW (ref 98–111)
Creatinine, Ser: 19.69 mg/dL — ABNORMAL HIGH (ref 0.61–1.24)
GFR calc Af Amer: 3 mL/min — ABNORMAL LOW (ref >60)
GFR calc non Af Amer: 2 mL/min — ABNORMAL LOW (ref >60)
Glucose, Bld: 87 mg/dL (ref 70–99)
Potassium: 4.7 mmol/L (ref 3.5–5.1)
Sodium: 136 mmol/L (ref 135–145)
Total Bilirubin: 0.8 mg/dL (ref 0.3–1.2)
Total Protein: 6.9 g/dL (ref 6.5–8.1)

## 2019-03-14 LAB — CBC WITH DIFFERENTIAL/PLATELET
Abs Immature Granulocytes: 0 10*3/uL (ref 0.00–0.07)
Basophils Absolute: 0 10*3/uL (ref 0.0–0.1)
Basophils Relative: 0 %
Eosinophils Absolute: 0.1 10*3/uL (ref 0.0–0.5)
Eosinophils Relative: 1 %
HCT: 26.9 % — ABNORMAL LOW (ref 39.0–52.0)
Hemoglobin: 9.2 g/dL — ABNORMAL LOW (ref 13.0–17.0)
Lymphocytes Relative: 17 %
Lymphs Abs: 1.2 10*3/uL (ref 0.7–4.0)
MCH: 38.2 pg — ABNORMAL HIGH (ref 26.0–34.0)
MCHC: 34.2 g/dL (ref 30.0–36.0)
MCV: 111.6 fL — ABNORMAL HIGH (ref 80.0–100.0)
Monocytes Absolute: 0.2 10*3/uL (ref 0.1–1.0)
Monocytes Relative: 3 %
Neutro Abs: 5.7 10*3/uL (ref 1.7–7.7)
Neutrophils Relative %: 79 %
Platelets: 130 10*3/uL — ABNORMAL LOW (ref 150–400)
RBC: 2.41 MIL/uL — ABNORMAL LOW (ref 4.22–5.81)
RDW: 13.4 % (ref 11.5–15.5)
WBC: 7.2 10*3/uL (ref 4.0–10.5)
nRBC: 0 % (ref 0.0–0.2)
nRBC: 0 /100 WBC

## 2019-03-14 LAB — GRAM STAIN

## 2019-03-14 LAB — PROTIME-INR
INR: 1 (ref 0.8–1.2)
Prothrombin Time: 12.6 seconds (ref 11.4–15.2)

## 2019-03-14 LAB — BODY FLUID CELL COUNT WITH DIFFERENTIAL
Lymphs, Fluid: 3 %
Monocyte-Macrophage-Serous Fluid: 90 % (ref 50–90)
Neutrophil Count, Fluid: 7 % (ref 0–25)
Total Nucleated Cell Count, Fluid: 1214 cu mm — ABNORMAL HIGH (ref 0–1000)

## 2019-03-14 LAB — CBG MONITORING, ED
Glucose-Capillary: 117 mg/dL — ABNORMAL HIGH (ref 70–99)
Glucose-Capillary: 134 mg/dL — ABNORMAL HIGH (ref 70–99)

## 2019-03-14 LAB — HEMOGLOBIN A1C
Hgb A1c MFr Bld: 5.8 % — ABNORMAL HIGH (ref 4.8–5.6)
Mean Plasma Glucose: 119.76 mg/dL

## 2019-03-14 LAB — APTT: aPTT: 29 seconds (ref 24–36)

## 2019-03-14 LAB — LACTIC ACID, PLASMA: Lactic Acid, Venous: 1.7 mmol/L (ref 0.5–1.9)

## 2019-03-14 LAB — GLUCOSE, CAPILLARY: Glucose-Capillary: 133 mg/dL — ABNORMAL HIGH (ref 70–99)

## 2019-03-14 LAB — SARS CORONAVIRUS 2 BY RT PCR (HOSPITAL ORDER, PERFORMED IN ~~LOC~~ HOSPITAL LAB): SARS Coronavirus 2: NEGATIVE

## 2019-03-14 MED ORDER — GENTAMICIN SULFATE 0.1 % EX CREA
1.0000 "application " | TOPICAL_CREAM | Freq: Every day | CUTANEOUS | Status: DC
  Administered 2019-03-15 – 2019-03-16 (×4): 1 via TOPICAL
  Filled 2019-03-14: qty 15

## 2019-03-14 MED ORDER — DELFLEX-LC/1.5% DEXTROSE 344 MOSM/L IP SOLN: INTRAPERITONEAL | Status: DC

## 2019-03-14 MED ORDER — MORPHINE SULFATE (PF) 4 MG/ML IV SOLN
4.0000 mg | Freq: Once | INTRAVENOUS | Status: AC
  Administered 2019-03-14: 4 mg via INTRAVENOUS
  Filled 2019-03-14: qty 1

## 2019-03-14 MED ORDER — HEPARIN 1000 UNIT/ML FOR PERITONEAL DIALYSIS
INTRAPERITONEAL | Status: DC | PRN
  Filled 2019-03-14: qty 5000

## 2019-03-14 MED ORDER — DOXAZOSIN MESYLATE 4 MG PO TABS
4.0000 mg | ORAL_TABLET | Freq: Every day | ORAL | Status: DC
  Administered 2019-03-14: 4 mg via ORAL
  Filled 2019-03-14: qty 2
  Filled 2019-03-14: qty 1

## 2019-03-14 MED ORDER — GENTAMICIN SULFATE 0.1 % EX CREA: 1.0000 "application " | TOPICAL_CREAM | Freq: Every day | CUTANEOUS | Status: DC

## 2019-03-14 MED ORDER — VANCOMYCIN HCL IN DEXTROSE 1-5 GM/200ML-% IV SOLN: 1000.0000 mg | Freq: Once | INTRAVENOUS | Status: DC

## 2019-03-14 MED ORDER — HYDROMORPHONE HCL 2 MG PO TABS
2.0000 mg | ORAL_TABLET | ORAL | Status: DC | PRN
  Administered 2019-03-14 – 2019-03-15 (×2): 2 mg via ORAL
  Filled 2019-03-14 (×3): qty 1

## 2019-03-14 MED ORDER — SODIUM CHLORIDE 0.9 % IV SOLN: 2.0000 g | Freq: Once | INTRAVENOUS | Status: DC

## 2019-03-14 MED ORDER — PANTOPRAZOLE SODIUM 40 MG PO TBEC
40.0000 mg | DELAYED_RELEASE_TABLET | Freq: Every day | ORAL | Status: DC
  Administered 2019-03-14 – 2019-03-17 (×4): 40 mg via ORAL
  Filled 2019-03-14 (×4): qty 1

## 2019-03-14 MED ORDER — METOPROLOL TARTRATE 25 MG PO TABS
100.0000 mg | ORAL_TABLET | Freq: Two times a day (BID) | ORAL | Status: DC
  Administered 2019-03-14: 100 mg via ORAL
  Filled 2019-03-14: qty 4

## 2019-03-14 MED ORDER — HYDROMORPHONE HCL 2 MG PO TABS
2.0000 mg | ORAL_TABLET | Freq: Once | ORAL | Status: AC
  Administered 2019-03-14: 2 mg via ORAL

## 2019-03-14 MED ORDER — ZOLPIDEM TARTRATE 5 MG PO TABS
10.0000 mg | ORAL_TABLET | Freq: Every evening | ORAL | Status: DC | PRN
  Administered 2019-03-14 – 2019-03-15 (×2): 10 mg via ORAL
  Filled 2019-03-14 (×3): qty 2

## 2019-03-14 MED ORDER — ONDANSETRON HCL 4 MG/2ML IJ SOLN
4.0000 mg | Freq: Once | INTRAMUSCULAR | Status: AC
  Administered 2019-03-14: 4 mg via INTRAVENOUS
  Filled 2019-03-14: qty 2

## 2019-03-14 MED ORDER — INSULIN ASPART 100 UNIT/ML ~~LOC~~ SOLN: 0.0000 [IU] | Freq: Every day | SUBCUTANEOUS | Status: DC

## 2019-03-14 MED ORDER — VANCOMYCIN HCL IN DEXTROSE 1-5 GM/200ML-% IV SOLN
1000.0000 mg | Freq: Once | INTRAVENOUS | Status: AC
  Administered 2019-03-14: 1000 mg via INTRAVENOUS
  Filled 2019-03-14: qty 200

## 2019-03-14 MED ORDER — ACETAMINOPHEN 650 MG RE SUPP: 650.0000 mg | Freq: Four times a day (QID) | RECTAL | Status: DC | PRN

## 2019-03-14 MED ORDER — DELFLEX-LC/2.5% DEXTROSE 394 MOSM/L IP SOLN: INTRAPERITONEAL | Status: DC

## 2019-03-14 MED ORDER — TACROLIMUS 0.5 MG PO CAPS
0.5000 mg | ORAL_CAPSULE | Freq: Every day | ORAL | Status: DC
  Administered 2019-03-15 – 2019-03-17 (×3): 0.5 mg via ORAL
  Filled 2019-03-14 (×3): qty 1

## 2019-03-14 MED ORDER — METOPROLOL TARTRATE 100 MG PO TABS: 100.0000 mg | ORAL_TABLET | Freq: Two times a day (BID) | ORAL | Status: DC

## 2019-03-14 MED ORDER — INSULIN ASPART 100 UNIT/ML ~~LOC~~ SOLN
0.0000 [IU] | Freq: Three times a day (TID) | SUBCUTANEOUS | Status: DC
  Administered 2019-03-15: 1 [IU] via SUBCUTANEOUS
  Administered 2019-03-16 – 2019-03-17 (×2): 2 [IU] via SUBCUTANEOUS

## 2019-03-14 MED ORDER — ACETAMINOPHEN 325 MG PO TABS: 650.0000 mg | ORAL_TABLET | Freq: Four times a day (QID) | ORAL | Status: DC | PRN

## 2019-03-14 MED ORDER — CALCITRIOL 0.5 MCG PO CAPS
0.7500 ug | ORAL_CAPSULE | Freq: Every day | ORAL | Status: DC
  Administered 2019-03-15 – 2019-03-17 (×3): 0.75 ug via ORAL
  Filled 2019-03-14 (×3): qty 1

## 2019-03-14 MED ORDER — INSULIN ASPART 100 UNIT/ML ~~LOC~~ SOLN
3.0000 [IU] | Freq: Three times a day (TID) | SUBCUTANEOUS | Status: DC
  Administered 2019-03-15 – 2019-03-17 (×4): 3 [IU] via SUBCUTANEOUS

## 2019-03-14 MED ORDER — PREDNISONE 5 MG PO TABS
5.0000 mg | ORAL_TABLET | Freq: Every day | ORAL | Status: DC
  Administered 2019-03-15 – 2019-03-17 (×3): 5 mg via ORAL
  Filled 2019-03-14 (×4): qty 1

## 2019-03-14 MED ORDER — SODIUM CHLORIDE 0.9 % IV SOLN
1.0000 g | INTRAVENOUS | Status: DC
  Administered 2019-03-14 – 2019-03-17 (×4): 1 g via INTRAVENOUS
  Filled 2019-03-14 (×4): qty 1

## 2019-03-14 MED ORDER — HEPARIN SODIUM (PORCINE) 5000 UNIT/ML IJ SOLN
5000.0000 [IU] | Freq: Three times a day (TID) | INTRAMUSCULAR | Status: DC
  Administered 2019-03-14 – 2019-03-16 (×6): 5000 [IU] via SUBCUTANEOUS
  Filled 2019-03-14 (×5): qty 1

## 2019-03-14 MED ORDER — FENTANYL CITRATE (PF) 100 MCG/2ML IJ SOLN
100.0000 ug | Freq: Once | INTRAMUSCULAR | Status: AC
  Administered 2019-03-14: 100 ug via INTRAVENOUS
  Filled 2019-03-14: qty 2

## 2019-03-14 MED ORDER — LATANOPROST 0.005 % OP SOLN
1.0000 [drp] | Freq: Every day | OPHTHALMIC | Status: DC
  Administered 2019-03-15: 1 [drp] via OPHTHALMIC
  Filled 2019-03-14: qty 2.5

## 2019-03-14 NOTE — ED Notes (Signed)
Dinner tray ordered.

## 2019-03-14 NOTE — ED Provider Notes (Signed)
Emergency Department Provider Note   I have reviewed the triage vital signs and the nursing notes.   HISTORY  Chief Complaint Abdominal Pain and Nausea   HPI James [name] is a 48 y.o. male with multiple medical problems documented below who presents the emergency department today with abdominal pain.  Patient states he has a history of peritonitis and it is severe similar to what this is right now.  He has no fevers, vomiting, diarrhea or constipation.   He does have limited nausea with it.  He states that the pain got significantly worse when he started his dialysis this evening and he called EMS brought here for further evaluation.  No trauma.  Pain to be worse in the left lower quadrant and suprapubic areas.  No rashes.  No trauma  No other associated or modifying symptoms.    Past Medical History:  Diagnosis Date  . Anemia   . Diabetes mellitus without complication ([place])   . Hypertension   . Influenza A   . Pneumonia 04/21/2017  . Renal disorder   . 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 He then got HD in [facility] until getting a renal transplant in 2009 at [place], [place].  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    . Smoker   . Tachycardia   . Transplant recipient     Patient Active Problem List   Diagnosis Date Noted  . Fever 09/21/2017  . Influenza A 09/21/2017  . Anemia   . Community acquired pneumonia of right upper lobe of lung ([place])   . Transaminitis   . Hypokalemia   . HCAP (healthcare-associated pneumonia) 04/19/2017  . Sepsis ([place])   . Sepsis due to Gram-negative organism with acute renal failure ([place]) 08/14/2015  . Acute on chronic renal failure ([place])   . Nausea vomiting and diarrhea   .  Renal transplant disorder   . Diarrhea 08/13/2015  . AKI (acute kidney injury) ([place]) 08/13/2015  . Urinary tract infection 04/10/2014  . UTI (urinary tract infection) 04/09/2014  . Tachycardia 04/09/2014  . Renal transplant recipient / ESRD   . Diabetes mellitus without complication ([place])   . Hypertension   . Smoker     Past Surgical History:  Procedure Laterality Date  . CHOLECYSTECTOMY    . HIP SURGERY    . INSERTION OF DIALYSIS CATHETER Left 04/24/2017   Procedure: EXCHANGE  OF DIALYSIS CATHETER;  Surgeon: Rosetta Posner, MD;  Location: [place];  Service: Vascular;  Laterality: Left;  . KIDNEY TRANSPLANT    . PERITONEAL CATHETER INSERTION Left 08/2017    Current Outpatient Rx  . Order #: [id number] Class: Historical Med  . Order #: [id number] Class: Normal  . Order #: [id number] Class: Historical Med  . Order #: [id number] Class: Historical Med  . Order #: [id number] Class: Historical Med  . Order #: [id number] Class: Historical Med  . Order #: [id number] Class: Historical Med  . Order #: [id number] Class: Normal  . Order #: [id number] Class: Print  . Order #: [id number] Class: Historical Med  . Order #: [id number] Class: Historical Med  . Order #: [id number] Class: Historical Med  . Order #: [id number] Class: Historical Med  . Order #:  [id number] Class: Historical Med  . Order #: [id number] Class: Historical Med  . Order #: [id number] Class: Historical Med  . Order #: [id number] Class: Historical Med  . Order #: [id number] Class: Historical Med  . Order #: [id number] Class: Historical Med  . Order #: [id number] Class: Historical Med  . Order #: [id number] Class: Historical Med  . Order #: [id number] Class: Historical Med  . Order #: [id number] Class: Print    Allergies Darvon [propoxyphene] and Tramadol  Family History  Adopted: Yes    Social History Social History   Tobacco Use  . Smoking status: Former Smoker    Packs/day: 0.20  . Smokeless tobacco: Never Used  Substance Use Topics  . Alcohol use:  No  . Drug use: No    Review of Systems  All other systems negative except as documented in the HPI. All pertinent positives and negatives as reviewed in the HPI. ____________________________________________   PHYSICAL EXAM:  VITAL SIGNS: ED Triage Vitals  Enc Vitals Group     BP 03/14/19 0007 (!) 139/97     Pulse Rate 03/14/19 0007 (!) 109     Resp 03/14/19 0007 (!) 21     Temp 03/14/19 0007 97.9 F (36.6 C)     Temp Source 03/14/19 0007 Temporal     SpO2 03/14/19 0003 98 %     Weight 03/14/19 0010 172 lb (78 kg)     Height 03/14/19 0010 5\' 6"  (1.676 m)     Head Circumference --      Peak Flow --      Pain Score 03/14/19 0009 9     Pain Loc --      Pain Edu? --      Excl. in [place]? --     Constitutional: Alert and oriented. Well appearing and in no acute distress. Eyes: Conjunctivae are normal. PERRL. EOMI. Head: Atraumatic. Nose: No congestion/rhinnorhea. Mouth/Throat: Mucous membranes are moist.  Oropharynx non-erythematous. Neck: No stridor.  No meningeal signs.   Cardiovascular: Normal rate, regular rhythm. Good peripheral circulation. Grossly normal heart sounds.   Respiratory: Normal respiratory effort.  No retractions. Lungs CTAB. Gastrointestinal: Soft and significant ttp in suprapubic, LLQ with rebound. Rest of abdomen benign.. No distention.  Musculoskeletal: No lower extremity tenderness nor edema. No gross deformities of extremities. Neurologic:  Normal speech and language. No gross focal neurologic deficits are appreciated.  Skin:  Skin is warm, dry and intact. No rash noted.   ____________________________________________   LABS (all labs ordered are listed, but only abnormal results are displayed)  Labs Reviewed  COMPREHENSIVE METABOLIC PANEL - Abnormal; Notable for the following components:      Result Value   Chloride 91 (*)    BUN 41 (*)    Creatinine, Ser 19.69 (*)    Albumin 3.1 (*)    GFR calc non Af Amer 2 (*)    GFR calc Af Amer 3 (*)     Anion gap 17 (*)    All other components within normal limits  CBC WITH DIFFERENTIAL/PLATELET - Abnormal; Notable for the following components:   RBC 2.41 (*)    Hemoglobin 9.2 (*)    HCT 26.9 (*)    MCV 111.6 (*)    MCH 38.2 (*)    Platelets 130 (*)    All other components within normal limits  SARS CORONAVIRUS 2 ([place] [facility])  CULTURE, BLOOD (ROUTINE X 2)  CULTURE, BLOOD (ROUTINE X 2)  URINE CULTURE  BODY FLUID CULTURE  LACTIC ACID, PLASMA  APTT  PROTIME-INR  LACTIC ACID, PLASMA  URINALYSIS, ROUTINE W REFLEX MICROSCOPIC  BODY FLUID CELL COUNT WITH DIFFERENTIAL   ____________________________________________  EKG  My ECG Read Indication:abdominal pain, tachycardia EKG was personally contemporaneously reviewed by myself. Rate: 102 PR Interval: 200 QRS duration: 87 QT/QTC: 337/439 Axis: Left EKG: normal EKG, normal sinus rhythm, unchanged from previous tracings. Other significant findings: none  ____________________________________________  RADIOLOGY  Ct Abdomen Pelvis Wo Contrast  Result Date: 03/14/2019 CLINICAL DATA:  Lower abdominal pain. Nausea, vomiting, diarrhea. Kidney transplant recipient. EXAM: CT ABDOMEN AND PELVIS WITHOUT CONTRAST TECHNIQUE: Multidetector CT imaging of the abdomen and pelvis was performed following the standard protocol without IV contrast. COMPARISON:  02/26/2019 at [facility]. FINDINGS: Lower chest: Pericardial calcifications abutting the right heart border. Scattered lung base atelectasis. No pleural fluid. Hepatobiliary: Slight decrease in perihepatic free fluid from prior exam. Multiple low densities scattered throughout the liver unchanged from prior exam. Clips in the gallbladder fossa postcholecystectomy. No biliary dilatation. Pancreas: No ductal dilatation or inflammation. Spleen: Normal in size. Stable low-density in the anterior spleen, series 3, image 11. Probable splenule is anteriorly.  Adrenals/Urinary Tract: Normal adrenal glands. Atrophic native kidneys with small cysts, stable. Transplant kidney in the right iliac fossa without transplant hydronephrosis. No significant perinephric edema. Urinary bladder is partially distended. Stomach/Bowel: Stomach is within normal limits. Appendix appears normal. No evidence of small bowel wall thickening, distention, or inflammatory changes. No obstruction with administered enteric contrast reaching the colon. Minimal colonic wall thickening involving the sigmoid colon without pericolonic edema. Few scattered colonic diverticula without diverticulitis. Vascular/Lymphatic: Aortic atherosclerosis. No aneurysm. No enlarged lymph nodes in the abdomen or pelvis. Reproductive: Prostate again grossly negative, partially obscured by streak artifact from right hip arthroplasty. Other: Peritoneal dialysis catheter with tip in the right lower quadrant. No focal fluid collection adjacent to the catheter tip. Minimal free fluid dependently in the pelvis. Musculoskeletal: There are no acute or suspicious osseous abnormalities. Right hip arthroplasty. IMPRESSION: 1. Minimal colonic wall thickening involving the sigmoid colon without pericolonic edema, suspicious for colitis. 2. Mild sigmoid diverticulosis without diverticulitis. 3. Peritoneal dialysis catheter with tip in the right lower quadrant. Minimal free fluid dependently in the pelvis, slight decreased free fluid adjacent to the liver. No focal fluid collection adjacent to the catheter tip. Aortic Atherosclerosis (ICD10-I70.0). Electronically Signed   By: Keith Rake M.D.   On: 03/14/2019 03:25   Dg Chest Port 1 View  Result Date: 03/14/2019 CLINICAL DATA:  Concern for free air.  Suprapubic abdominal pain. EXAM: PORTABLE CHEST 1 VIEW COMPARISON:  Nov 21, 2017 FINDINGS: The previously noted left-sided dialysis catheter has been removed. The heart size remains mildly enlarged. There is no focal infiltrate. No  pneumothorax. No free air under the hemidiaphragms. There is no displaced fracture. Overall evaluation is somewhat limited by patient rotation. IMPRESSION: No active disease. Electronically Signed   By: Constance Holster M.D.   On: 03/14/2019 01:02    ____________________________________________   PROCEDURES  Procedure(s) performed:   Procedures   ____________________________________________   INITIAL IMPRESSION / ASSESSMENT AND PLAN / ED COURSE  We will certainly entertain the idea of peritonitis however very localized to making me concern for possible diverticulitis versus colitis.  Ct w/ e/o colitis. Discussed with neprhology (Dr. Royce Macadamia) regarding antibiotics and whether or not we need a peritoneal cell count and she states that they can have a PD nurse come in around [phone] to draw some fluid to send to the lab. After the  results of cell count come back, discuss with nephrology at that time for disposition/abx recommendations but likely discharge.  I discussed the time of getting this done and admitting him to the hospital for same but she didn't think he needed to be admitted. Will obs in ED until cell count and final disposition.    Pertinent labs & imaging results that were available during my care of the patient were reviewed by me and considered in my medical decision making (see chart for details).  ____________________________________________  FINAL CLINICAL IMPRESSION(S) / ED DIAGNOSES  Final diagnoses:  None     MEDICATIONS GIVEN DURING THIS VISIT:  Medications  fentaNYL (SUBLIMAZE) injection 100 mcg (100 mcg Intravenous Given 03/14/19 0029)  ondansetron (ZOFRAN) injection 4 mg (4 mg Intravenous Given 03/14/19 0027)     NEW OUTPATIENT MEDICATIONS STARTED DURING THIS VISIT:  New Prescriptions   No medications on file    Note:  This note was prepared with assistance of Dragon voice recognition software. Occasional wrong-word or sound-a-like substitutions  may have occurred due to the inherent limitations of voice recognition software.   Loyde Orth, Corene Cornea, MD 03/14/19 [phone]

## 2019-03-14 NOTE — ED Notes (Signed)
Patient transported to CT 

## 2019-03-14 NOTE — ED Notes (Signed)
Internal medicine at bedside

## 2019-03-14 NOTE — ED Notes (Signed)
C/o abd pain whenever he eats

## 2019-03-14 NOTE — ED Notes (Signed)
Ordered diet tray for pt  

## 2019-03-14 NOTE — ED Triage Notes (Addendum)
Pt arrived via [place] and suprapubic abdominal pain (sharp, 10/10) accompanied by N/V/D. Alert and oriented x4, VS stable. Zofran 4mg  IM administered by EMS. Pt is on peritoneal dialysis and the pain worsened during dialysis at 2200 at home.

## 2019-03-14 NOTE — ED Notes (Signed)
Lunch tray at bedside. ?

## 2019-03-14 NOTE — ED Notes (Signed)
Reported and read back to RN CC.

## 2019-03-14 NOTE — ED Provider Notes (Signed)
Renal team indicates needs antibiotic therapy for capd peritonitis. They indicate they are unable to facilitate that treatment as outpatient, and requests admission to medical service. They have placed the orders for antibiotic therapy, and will continue to follow.   Pt agreeable w plan. Pt currently awake, alert, content. No nv. Hr 94, rr 16. No acute distress.  Unassigned medicine consulted for admission.     Lajean Saver, MD 03/14/19 1028

## 2019-03-14 NOTE — Consult Note (Addendum)
[facility] Renal Consultation Note  Requesting MD: Raines/ Mesner Indication for Consultation:  ESRD on PD  Chief complaint: abdominal pain  HPI:  James [name] is a 48 y.o. male with a history of end-stage renal disease on peritoneal dialysis through the [facility] facility who presented to the ER with abdominal pain.  The pain is similar to prior episode of peritonitis.  States that he has had this pain progressively worsening over the past few weeks.  Also states that he has had n/v and diarrhea for weeks.  Nephrology was consulted.  Peritoneal fluid cell count and culture were obtained with greater than 1000 white cells.  He was ordered for vancomycin 1 g and cefepime 1 gram in the ER.  Spoke with the home training nurse at [facility] and there is no way to get him peritoneal antibiotics before Monday.  Patient states that he did not make it through the first exchange secondary to pain he did not fill past 300 mL.  He states the HD nurse just drew off of his existing dwell from last night when obtaining the culture today.   SARS coronavirus 2 negative.  I have reviewed the patient's current medications.  He specifically confirmed his antihypertensives and antirejection meds and bone mineral medication.  States that he doesn't always take his potassium.    CT was suggestive of possible colitis with minimal colonic wall thickening   He follows with Dr. Oneita Hurt.  He normally does 5 exchanges of 2.5% dextrose over 9 hours.  He has a 2 L fill volume.  He normally has a last bag fill which he thinks 1 L.   PMHx:   Past Medical History:  Diagnosis Date  . Anemia   . Diabetes mellitus without complication ([place])   . Hypertension   . Influenza A   . Pneumonia 04/21/2017  . Renal disorder   . 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 He then got HD in  [facility] until getting a renal transplant in 2009 at [place], [place].  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    . Smoker   . Tachycardia   . Transplant recipient     Past Surgical History:  Procedure Laterality Date  . CHOLECYSTECTOMY    . HIP SURGERY    . INSERTION OF DIALYSIS CATHETER Left 04/24/2017   Procedure: EXCHANGE  OF DIALYSIS CATHETER;  Surgeon: Rosetta Posner, MD;  Location: [place];  Service: Vascular;  Laterality: Left;  . KIDNEY TRANSPLANT    . PERITONEAL CATHETER INSERTION Left 08/2017    Family Hx:  Family History  Adopted: Yes    Social History:  reports that he has quit smoking. He smoked 0.20 packs per day. He has never used smokeless tobacco. He reports that he does not drink alcohol or use drugs.  Allergies:  Allergies  Allergen Reactions  . Darvon [Propoxyphene] Rash    Broke out on eyelid   . Tramadol Rash    Medications: Prior to Admission medications   Medication Sig Start Date End Date Taking? Authorizing Provider  acetaminophen (TYLENOL) 500 MG tablet Take 500-1,000 mg by mouth every 6 (  six) hours as needed for headache (pain).   Yes [provider]  amLODipine (NORVASC) 5 MG tablet Take 1 tablet (5 mg total) by mouth daily. Patient taking differently: Take 5 mg by mouth 2 (two) times daily.  08/16/15  Yes Liberty Handy, MD  calcitRIOL (ROCALTROL) 0.25 MCG capsule Take 0.75 mcg by mouth daily.   Yes [provider]  diphenhydrAMINE (BENADRYL) 25 mg capsule Take 25 mg by mouth every 6 (six) hours as needed for itching.   Yes [provider]  doxazosin (CARDURA) 4 MG tablet Take 4 mg by mouth at bedtime. 12/25/18  Yes [provider]  ferric citrate (AURYXIA) 1 GM 210 MG(Fe) tablet Take 210 mg by mouth daily with supper.   Yes [provider]  ferrous sulfate 325 (65 FE) MG  tablet Take 325 mg by mouth 2 (two) times daily with a meal.   Yes [provider]  insulin aspart (NOVOLOG FLEXPEN) 100 UNIT/ML FlexPen Inject 10 Units into the skin 3 (three) times daily with meals. Patient taking differently: Inject 15 Units into the skin 3 (three) times daily with meals.  04/25/17  Yes Mariel Aloe, MD  Insulin Detemir (LEVEMIR FLEXTOUCH) 100 UNIT/ML Pen Inject 15 Units into the skin at bedtime. Patient taking differently: Inject 15 Units into the skin every other day.  09/23/17  Yes Regalado, Belkys A, MD  latanoprost (XALATAN) 0.005 % ophthalmic solution Place 1 drop into both eyes at bedtime.   Yes [provider]  lisinopril (ZESTRIL) 10 MG tablet Take 10 mg by mouth daily.   Yes [provider]  loperamide (IMODIUM) 2 MG capsule Take 2 mg by mouth daily as needed for diarrhea or loose stools.   Yes [provider]  Melatonin 5 MG CAPS Take 5 mg by mouth at bedtime as needed (sleep).    Yes [provider]  metoprolol tartrate (LOPRESSOR) 100 MG tablet Take 100 mg by mouth 2 (two) times daily.    Yes [provider]  Multiple Vitamins-Minerals (MULTIVITAMIN WITH MINERALS) tablet Take 1 tablet by mouth 2 (two) times daily. Centrum   Yes [provider]  omeprazole (PRILOSEC) 20 MG capsule Take 20 mg by mouth daily.   Yes [provider]  potassium chloride SA (K-DUR) 20 MEQ tablet Take 20 mEq by mouth every Monday, Wednesday, and Friday. 10/15/18  Yes [provider]  predniSONE (DELTASONE) 5 MG tablet Take 5 mg by mouth daily with breakfast.   Yes [provider]  sodium bicarbonate 650 MG tablet Take 1,300 mg by mouth 3 (three) times daily.    Yes [provider]  tacrolimus (PROGRAF) 0.5 MG capsule Take 0.5 mg by mouth daily.    Yes [provider]  valGANciclovir (VALCYTE) 450 MG tablet Take 450 mg by mouth See admin instructions. Take one tablet (450 mg) by mouth on  Monday, Wednesday, Friday before dialysis 08/22/17  Yes [provider]  zolpidem (AMBIEN) 10 MG tablet Take 10 mg by mouth at bedtime as needed for sleep.    Yes [provider]  HYDROcodone-acetaminophen (NORCO/VICODIN) 5-325 MG tablet Take 1-2 tablets by mouth every 6 (six) hours as needed for moderate pain or severe pain. Patient not taking: Reported on 03/14/2019 11/16/17   Emeline General, PA-C    I have reviewed the patient's current medications.  He specifically confirmed his antihypertensives and antirejection meds and bone mineral medication.  States that he doesn't always take his potassium.  Labs:  BMP Latest Ref Rng & Units 03/14/2019 09/23/2017 09/22/2017  Glucose 70 - 99 mg/dL 87 125(H) 92  BUN 6 - 20 mg/dL 41(H) 59(H) 53(H)  Creatinine 0.61 - 1.24 mg/dL 19.69(H) 11.23(H) 9.56(H)  Sodium 135 - 145 mmol/L 136 141 139  Potassium 3.5 - 5.1 mmol/L 4.7 4.5 4.6  Chloride 98 - 111 mmol/L 91(L) 111 110  CO2 22 - 32 mmol/L 28 16(L) 18(L)  Calcium 8.9 - 10.3 mg/dL 9.6 8.8(L) 8.8(L)    Urinalysis    Component Value Date/Time   COLORURINE YELLOW 09/21/2017 [place] 09/21/2017 1415   LABSPEC 1.011 09/21/2017 1415   PHURINE 9.0 (H) 09/21/2017 1415   GLUCOSEU 50 (A) 09/21/2017 1415   HGBUR NEGATIVE 09/21/2017 1415   BILIRUBINUR NEGATIVE 09/21/2017 1415   KETONESUR NEGATIVE 09/21/2017 1415   PROTEINUR >=300 (A) 09/21/2017 1415   UROBILINOGEN 0.2 04/09/2014 2013   NITRITE NEGATIVE 09/21/2017 1415   LEUKOCYTESUR NEGATIVE 09/21/2017 1415     ROS:  Pertinent items noted in HPI and remainder of comprehensive ROS otherwise negative.  Physical Exam: Vitals:   03/14/19 1330 03/14/19 1352  BP: 110/71 111/72  Pulse: 99 (!) 104  Resp: (!) 22   Temp:    SpO2: 99%      General: Adult male ambulatory no acute distress at rest HEENT: Normocephalic atraumatic Eyes: extraocular movements intact sclera anicteric Neck: Supple trachea midline Heart:  Tachycardia no rub Lungs: Clear to auscultation bilaterally normal work of breathing at rest Abdomen: tender to palpation; tenckhoff intact without exudate Extremities: no pitting edema   Skin: no rash on extremities exposed  Neuro: alert and oriented x 3; conversant   Assessment/Plan:  # Peritonitis  - associated with PD  - vancomycin 1 gram in ER - Cefepime 1 gram IV daily for now - vanc level in AM - Follow-up culture  - repeat cell count   # Abdominal pain  - cell count suggestive of peritonitis however patient also with n/v/d - note CT with concern for possible colitis - defer to primary team   # ESRD  - PD tonight per routine - ordered - would not resume home potassium   # HTN  - marginal pressures and 101/68 most recently - Would hold medications and prioritize his beta blocker when meds are added back  - 1% dextrose tonight   # Secondary hyperpara  - Continue home calcitriol and auryxia   # Hx renal transplant - Continue current immunosuppression regimen   # Anemia 2/2 CKD - no acute indication for PRBC's  Claudia Desanctis 03/14/2019, 2:58 PM

## 2019-03-14 NOTE — ED Notes (Signed)
THE PT REPORTS THAT HIS FEET ARE NUMB

## 2019-03-14 NOTE — Progress Notes (Signed)
Paged by nursing for 8 out of 10 abdominal pain.  Patient given as needed Dilaudid 30 minutes prior to page.  On assessment patient says this is the same pain that he has been experiencing with his peritonitis.  He says it is worse because he was recently moved.    Gen: Lying on right side in bed, no acute distress Abdominal: Bowel sounds are normal, no distention. Tender to palpation in suprapubic and periumbilical region.  Patient here with peritonitis and on broad-spectrum antibiotics.  Exam consistent with earlier exams and patient reports no acute changes.  Pain was worsened by recent movements.  - given  one time dose of 2 mg dilaudid - continue prn dilaudid q4hrs    Tamsen Snider, MD PGY1  [phone]

## 2019-03-14 NOTE — ED Notes (Signed)
PD nurse at bedside and has collected samples.

## 2019-03-14 NOTE — ED Notes (Signed)
Please James [name], pt daughter in law at [phone] with update, pt son will be coming when he gets off work.

## 2019-03-14 NOTE — ED Notes (Signed)
Pt states he does not produce urine

## 2019-03-14 NOTE — ED Notes (Signed)
I checked on the room  Still in the cleaning mode????

## 2019-03-14 NOTE — ED Notes (Signed)
Report given to rn on 5m 

## 2019-03-14 NOTE — H&P (Signed)
Date: 03/14/2019               Patient Name:  James [name] MRN: [id number]  DOB: [birth year] Age / Sex: 48 y.o., male   PCP: Patient, No Pcp Per         Medical Service: Internal Medicine Teaching Service         Attending Physician: Dr. Rebeca Alert, Raynaldo Opitz, MD    First Contact: Marva Panda, MD, [place] Pager: [place] [phone])  Second Contact: Shan Levans, MD, Erlene Quan Pager: [phone])       After Hours (After 5p/  First Contact Pager: [phone]  weekends / holidays): Second Contact Pager: [phone]   Chief Complaint: abdominal pain   History of Present Illness: 48 y.o. yo male w/ PMH significant for HTN, DM, ESRD on PD presenting with abdominal pain for one day duration. He reports that the pain started overnight during dialysis session. Pain is periumbilical radiating to the suprapubic and left lower quadrant. He also reports nausea and diarrhea for past 3 days but denies vomiting. He has a history of peritonitis and he feels that this is similar to his previous episode.  Additionally, he has a sensation of not being able to complete a bowel movement due to enlarged prostate. Patient is oligouric but does endorse some dysuria and hesitancy with initiating stream. Of note, he is following up with urologist for elevated PSA. Patient denies any fever/chills, headache, dizziness, chest pain, shortness of breath, change in appetite or eating anything unusual, constipation, or recent sick contacts.   Patient brought to ED by EMS. CT consistent with colitis. Pt discussed with nephrology for possible peritonitis as peritoneal fluid analysis with straw colored with hazy appearance and containing >1000 WBC. No organism seen on gram stain. Patient to be admitted for IV antibiotics and PD.   Meds:  Current Meds  Medication Sig  . acetaminophen (TYLENOL) 500 MG tablet Take 500-1,000 mg by mouth every 6 (six) hours as needed for headache (pain).  Marland Kitchen amLODipine (NORVASC) 5 MG tablet Take 1 tablet (5  mg total) by mouth daily. (Patient taking differently: Take 5 mg by mouth 2 (two) times daily. )  . calcitRIOL (ROCALTROL) 0.25 MCG capsule Take 0.75 mcg by mouth daily.  . diphenhydrAMINE (BENADRYL) 25 mg capsule Take 25 mg by mouth every 6 (six) hours as needed for itching.  Marland Kitchen doxazosin (CARDURA) 4 MG tablet Take 4 mg by mouth at bedtime.  . ferric citrate (AURYXIA) 1 GM 210 MG(Fe) tablet Take 210 mg by mouth daily with supper.  . ferrous sulfate 325 (65 FE) MG tablet Take 325 mg by mouth 2 (two) times daily with a meal.  . insulin aspart (NOVOLOG FLEXPEN) 100 UNIT/ML FlexPen Inject 10 Units into the skin 3 (three) times daily with meals. (Patient taking differently: Inject 15 Units into the skin 3 (three) times daily with meals. )  . Insulin Detemir (LEVEMIR FLEXTOUCH) 100 UNIT/ML Pen Inject 15 Units into the skin at bedtime. (Patient taking differently: Inject 15 Units into the skin every other day. )  . latanoprost (XALATAN) 0.005 % ophthalmic solution Place 1 drop into both eyes at bedtime.  Marland Kitchen lisinopril (ZESTRIL) 10 MG tablet Take 10 mg by mouth daily.  Marland Kitchen loperamide (IMODIUM) 2 MG capsule Take 2 mg by mouth daily as needed for diarrhea or loose stools.  . Melatonin 5 MG CAPS Take 5 mg by mouth at bedtime as needed (sleep).   . metoprolol tartrate (LOPRESSOR) 100 MG tablet  Take 100 mg by mouth 2 (two) times daily.   . Multiple Vitamins-Minerals (MULTIVITAMIN WITH MINERALS) tablet Take 1 tablet by mouth 2 (two) times daily. Centrum  . omeprazole (PRILOSEC) 20 MG capsule Take 20 mg by mouth daily.  . potassium chloride SA (K-DUR) 20 MEQ tablet Take 20 mEq by mouth every Monday, Wednesday, and Friday.  . predniSONE (DELTASONE) 5 MG tablet Take 5 mg by mouth daily with breakfast.  . sodium bicarbonate 650 MG tablet Take 1,300 mg by mouth 3 (three) times daily.   . tacrolimus (PROGRAF) 0.5 MG capsule Take 0.5 mg by mouth daily.   . valGANciclovir (VALCYTE) 450 MG tablet Take 450 mg by mouth See  admin instructions. Take one tablet (450 mg) by mouth on Monday, Wednesday, Friday before dialysis  . zolpidem (AMBIEN) 10 MG tablet Take 10 mg by mouth at bedtime as needed for sleep.      Allergies: Allergies as of 03/13/2019 - Review Complete 11/16/2017  Allergen Reaction Noted  . Darvon [propoxyphene] Rash 04/09/2014  . Tramadol Rash 04/09/2014   Past Medical History:  Diagnosis Date  . Anemia   . Diabetes mellitus without complication ([place])   . Hypertension   . Influenza A   . Pneumonia 04/21/2017  . Renal disorder   . 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 He then got HD in [facility] until getting a renal transplant in 2009 at [facility] [place].  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    . Smoker   . Tachycardia   . Transplant recipient     Family History:  Family History  Adopted: Yes     Social History:  Social History   Tobacco Use  . Smoking status: Former Smoker    Packs/day: 0.20  . Smokeless tobacco: Never Used  Substance Use Topics  . Alcohol use: No  . Drug use: No     Review of Systems: A complete ROS was negative except as per HPI.   Physical Exam: Blood pressure 131/78, pulse 99, temperature 98 F (36.7 C), temperature source Oral, resp. rate (!) 21, height 5\' 6"  (1.676 m), weight 78 kg, SpO2 97 %.   Physical Exam Constitutional:      General: He is not in acute distress.    Appearance: He is well-developed.  HENT:     Head: Normocephalic and atraumatic.  Eyes:     General: No scleral icterus.    Extraocular Movements: Extraocular movements intact.  Cardiovascular:     Rate and Rhythm: Regular rhythm. Tachycardia present.     Heart sounds: No murmur. No friction rub. No gallop.    Pulmonary:     Effort: Pulmonary effort is normal. No respiratory distress.     Breath sounds: Normal breath sounds. No wheezing, rhonchi or rales.  Chest:     Chest wall: No tenderness.  Abdominal:     General: Abdomen is flat. Bowel sounds are normal. There is no distension or abdominal bruit.     Palpations: Abdomen is soft. There is no fluid wave.     Tenderness: There is abdominal tenderness in the periumbilical area, suprapubic area and left lower quadrant. There  is no guarding or rebound.     Hernia: No hernia is present.  Skin:    General: Skin is warm and dry.     Findings: No erythema or rash.  Neurological:     General: No focal deficit present.     Mental Status: He is alert and oriented to person, place, and time.     Cranial Nerves: No cranial nerve deficit.     Motor: No weakness.  Psychiatric:        Mood and Affect: Mood is anxious.        Behavior: Behavior normal.     EKG: personally reviewed my interpretation is sinus tachycardia with rate 102 bpm. QT/QTc 337/439 ms.  CXR: personally reviewed my interpretation is no active cardiopulmonary disease.   CT ABDOMEN/PELVIS WO CONTRAST:  IMPRESSION: 1. Minimal colonic wall thickening involving the sigmoid colon without pericolonic edema, suspicious for colitis. 2. Mild sigmoid diverticulosis without diverticulitis. 3. Peritoneal dialysis catheter with tip in the right lower quadrant. Minimal free fluid dependently in the pelvis, slight decreased free fluid adjacent to the liver. No focal fluid collection adjacent to the catheter tip. 4. Aortic atherosclerosis     Assessment & Plan by Problem: Active Problems:   Peritonitis associated with peritoneal dialysis [facility])  Patient is a 48yo male with PMHx of HTN, DM, ESRD on PD after failed kidney transplant presenting with abdominal pain during PD and diarrhea for 3 days. Pain is consistent with peritonitis given WBC count in peritoneal fluid, patient started on IV  vancomycin and cefepime.   Peritonitis associated with peritoneal dialysis:  Patient with ESRD on PD presenting with abdominal pain of acute onset during PD session last night. He reports pain is similar to previous episode of peritonitis and peritoneal fluid analysis with >1000 WBC. Patient started on vancomycin and cefepime in ED. Patient afebrile and no leukocytosis noted on labs.  Patient also reported diarrhea and nausea for 3 days and CT showing colonic wall thickening suspicious for colitis. Patient already on appropriate antibiotic coverage. Will continue to monitor.   - PD per nephrology  - Continue cefepime 1g IV qd and vancomycin  - Vanc level in AM  - F/u blood culture - F/u repeat cell count    ESRD on PD:  Patient with history of ESRD on HD after failed renal transplant. Patient will continue with PD tonight per nephrology.   - Continue PD per routine - Hold home K per nephrology recommendations    Hypertension:  BP 105/68. Patient on amlodipine 5mg  qd and metoprolol 100mg  bid. 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 Will continue home calcitriol and auryxia.   Anemia: Likely secondary to ESRD. Hb 9.2 this admission.  - Continue to monitor with CBC  - Iron studies  Hx of renal transplant: Patient w/ hx of renal transplant 26yrs ago on immunosuppressants. -Continue tacrolimus 0.5mg  qd   Anxiety:  - Contiue Ambien 10mg  qHS prn  Dispo: Admit patient to Inpatient with expected length of stay greater than 2 midnights.  Signed: Harvie Heck, MD  Internal Medicine, PGY-1 03/14/2019, 1:05 PM  Pager: [phone]

## 2019-03-15 DIAGNOSIS — D509 Iron deficiency anemia, unspecified: Secondary | ICD-10-CM

## 2019-03-15 DIAGNOSIS — T8611 Kidney transplant rejection: Secondary | ICD-10-CM

## 2019-03-15 LAB — RENAL FUNCTION PANEL
Albumin: 2.7 g/dL — ABNORMAL LOW (ref 3.5–5.0)
Anion gap: 17 — ABNORMAL HIGH (ref 5–15)
BUN: 52 mg/dL — ABNORMAL HIGH (ref 6–20)
CO2: 27 mmol/L (ref 22–32)
Calcium: 9.1 mg/dL (ref 8.9–10.3)
Chloride: 90 mmol/L — ABNORMAL LOW (ref 98–111)
Creatinine, Ser: 19.87 mg/dL — ABNORMAL HIGH (ref 0.61–1.24)
GFR calc Af Amer: 3 mL/min — ABNORMAL LOW (ref >60)
GFR calc non Af Amer: 2 mL/min — ABNORMAL LOW (ref >60)
Glucose, Bld: 114 mg/dL — ABNORMAL HIGH (ref 70–99)
Phosphorus: 7.8 mg/dL — ABNORMAL HIGH (ref 2.5–4.6)
Potassium: 4.7 mmol/L (ref 3.5–5.1)
Sodium: 134 mmol/L — ABNORMAL LOW (ref 135–145)

## 2019-03-15 LAB — GLUCOSE, CAPILLARY
Glucose-Capillary: 104 mg/dL — ABNORMAL HIGH (ref 70–99)
Glucose-Capillary: 121 mg/dL — ABNORMAL HIGH (ref 70–99)
Glucose-Capillary: 92 mg/dL (ref 70–99)
Glucose-Capillary: 155 mg/dL — ABNORMAL HIGH (ref 70–99)

## 2019-03-15 LAB — URINE CULTURE

## 2019-03-15 LAB — CBC
HCT: 22.3 % — ABNORMAL LOW (ref 39.0–52.0)
Hemoglobin: 7.7 g/dL — ABNORMAL LOW (ref 13.0–17.0)
MCH: 38.3 pg — ABNORMAL HIGH (ref 26.0–34.0)
MCHC: 34.5 g/dL (ref 30.0–36.0)
MCV: 110.9 fL — ABNORMAL HIGH (ref 80.0–100.0)
Platelets: 102 10*3/uL — ABNORMAL LOW (ref 150–400)
RBC: 2.01 MIL/uL — ABNORMAL LOW (ref 4.22–5.81)
RDW: 13.3 % (ref 11.5–15.5)
WBC: 4.9 10*3/uL (ref 4.0–10.5)
nRBC: 0 % (ref 0.0–0.2)

## 2019-03-15 LAB — VANCOMYCIN, RANDOM: Vancomycin Rm: 20

## 2019-03-15 LAB — HIV ANTIBODY (ROUTINE TESTING W REFLEX): HIV Screen 4th Generation wRfx: NONREACTIVE

## 2019-03-15 MED ORDER — DELFLEX-LC/1.5% DEXTROSE 344 MOSM/L IP SOLN
INTRAPERITONEAL | Status: DC
  Administered 2019-03-16: 5000 mL via INTRAPERITONEAL

## 2019-03-15 MED ORDER — ONDANSETRON HCL 4 MG/2ML IJ SOLN
4.0000 mg | Freq: Three times a day (TID) | INTRAMUSCULAR | Status: DC | PRN
  Administered 2019-03-15 – 2019-03-16 (×4): 4 mg via INTRAVENOUS
  Filled 2019-03-15 (×4): qty 2

## 2019-03-15 MED ORDER — GENTAMICIN SULFATE 0.1 % EX CREA
1.0000 "application " | TOPICAL_CREAM | Freq: Every day | CUTANEOUS | Status: DC
  Administered 2019-03-16 – 2019-03-17 (×2): 1 via TOPICAL
  Filled 2019-03-15: qty 15

## 2019-03-15 MED ORDER — VANCOMYCIN HCL IN DEXTROSE 1-5 GM/200ML-% IV SOLN
1000.0000 mg | Freq: Once | INTRAVENOUS | Status: AC
  Administered 2019-03-16: 1000 mg via INTRAVENOUS
  Filled 2019-03-15: qty 200

## 2019-03-15 MED ORDER — FERRIC CITRATE 1 GM 210 MG(FE) PO TABS
210.0000 mg | ORAL_TABLET | Freq: Every day | ORAL | Status: DC
  Administered 2019-03-15: 210 mg via ORAL
  Filled 2019-03-15: qty 1

## 2019-03-15 NOTE — Progress Notes (Addendum)
  Date: 03/15/2019  Patient name: James [name] [facility]  Medical record number: [id number]  Date of birth: Jan 06, 1971   I have seen and evaluated this patient and I have discussed the plan of care with the house staff. Please see their note for complete details. I concur with their findings with the following additions/corrections:   Feeling much better today after IV antibiotics. Tolerated filling with PD fluid, which he was not able to do at home due to pain. Cultures still negative, will follow. Dr. Royce Macadamia checking repeat cell count.  On my review of CT, sigmoid colitis is minimal, doubt this is a significant component of his symptoms.   Lenice Pressman, M.D., Ph.D. 03/15/2019, 2:25 PM

## 2019-03-15 NOTE — Plan of Care (Signed)
  Problem: Health Behavior/Discharge Planning: Goal: Ability to manage health-related needs will improve Outcome: Progressing   

## 2019-03-15 NOTE — Progress Notes (Signed)
Pt c/o nausea, messaged MD on call.   Eleanora Neighbor, RN

## 2019-03-15 NOTE — Progress Notes (Signed)
New Admission Note:  Arrival Method: By bed from ED around 2230 Mental Orientation: Alert and oriented Telemetry: Box 12, CCMD notified Assessment: Completed Skin: Completed, refer to flowsheets IV: Left hand S.L. Pain: 8/10 Tubes: LLQ Peritoneal Dialysis catheter Safety Measures: Safety Fall Prevention Plan was given, discussed and signed. Admission: Completed [facility] Orientation: Patient has been orientated to the room, unit and the staff. Family: None  Orders have been reviewed and implemented. Will continue to monitor the patient. Call light has been placed within reach and bed alarm has been activated.   Perry Mount, RN  Phone Number: [phone]

## 2019-03-15 NOTE — Progress Notes (Signed)
Subjective: Patient presented yesterday to ED with worsening abdominal pain for 3 weeks. Patient started PD overnight after presenting to floor and is in filling stage when seen. He is tolerating it well and reports that he was unable to tolerate filling at home previously. Abdominal pain is improved and nausea overnight due to morphine but improved after receiving zofran.   Objective:  Vital signs in last 24 hours: Vitals:   03/14/19 2205 03/15/19 0011 03/15/19 0528 03/15/19 0900  BP:  103/69 (!) 101/57 109/62  Pulse:  98 (!) 103 94  Resp:  18 18 18   Temp: 97.9 F (36.6 C) 98.5 F (36.9 C) 98 F (36.7 C) 98 F (36.7 C)  TempSrc: Oral Oral  Oral  SpO2:  97% 97% 98%  Weight: 77.6 kg 75.1 kg    Height:  5\' 6"  (1.676 m)     Physical Exam Constitutional:      Appearance: He is well-developed.  Cardiovascular:     Rate and Rhythm: Normal rate and regular rhythm.     Heart sounds: Normal heart sounds. No murmur. No friction rub. No gallop.   Pulmonary:     Effort: Pulmonary effort is normal. No respiratory distress.     Breath sounds: Normal breath sounds. No wheezing, rhonchi or rales.  Abdominal:     General: Bowel sounds are normal.     Palpations: Abdomen is soft.     Tenderness: There is abdominal tenderness in the right lower quadrant. There is no guarding or rebound.     Hernia: No hernia is present.  Neurological:     General: No focal deficit present.     Mental Status: He is alert and oriented to person, place, and time.     Cranial Nerves: No cranial nerve deficit.     Motor: No weakness.      Assessment/Plan:  Patient is a 48yo male with PMHx of HTN, DM, ESRD on PD after failed kidney transplant presenting with peritonitis. Peritoneal WBC count >1000 in peritoneal fluid without organisms on gram stain, patient started on IV vancomycin and cefepime. Awaiting peritoneal cultures.   Peritonitis associated with peritoneal dialysis:  Patient with ESRD on PD  presenting with worsening abdominal pain during PD session on 8/21. Pain similar to previous episode of peritonitis and peritoneal fluid analysis with >1000 WBC consistent with peritonitis. Patient started on vancomycin and cefepime in ED. Patient remains afebrile and no leukocytosis noted on labs. Blood cultures with no growth. Urine culture via I/O cath with multiple bacterial morphotypes, likely contaminant.Per nephrology, intraperitoneal antibiotics not needed at this time.  CT with minimal colonic wall thickening. Abdominal pain more likely secondary to peritonitis vs colitis.Patient is on vancomycin and cefepime. Will continue to monitor.   - PD per nephrology  - Continue cefepime 1g IV qd and vancomycin  - F/u blood culture - F/u repeat cell count    ESRD on PD:  Patient with history of ESRD on HD after failed renal transplant. Patient will continue with PD routine per nephrology. K 4.7 this AM  - Continue PD per routine - Hold home K per nephrology recommendations    Hypertension:  BP 109/62. Patient on amlodipine 5mg  qd and metoprolol 100mg  bid at home. Given hypotension here and per discussion with nephrology, will hold home BP medications.  - Hold home BP medications  Secondary hyperparathyroidism:  Ca 9.1, Phos 7.8 Secondary to ESRD.   - Continue home calcitriol and auryxia.   Acute anemia on chronic  macrocytic anemia: Likely secondary to ESRD. Hb 7.7 this AM (9.2 on admission) MCV 110.9. Like due to hemodynamic fluid shift. Patient denies any headache, dizziness, generalized weakness, chest pain or shortness of breath. No signs of overt bleeding noted. Will continue to monitor and transfuse as needed.   - Continue to monitor with CBC  - F/u B12 and Folate levels   Hx of renal transplant: Patient w/ hx of renal transplant 71yrs ago on immunosuppressants.  -Continue tacrolimus 0.5mg  qd   Anxiety:  - Contiue Ambien 10mg  qHS prn  Dispo: Anticipated discharge in  approximately 1-2 day(s).   Harvie Heck, MD  Internal Medicine PGY-1 03/15/2019, 1:46 PM Pager: [phone]

## 2019-03-15 NOTE — Progress Notes (Signed)
[facility] Progress Note  Name: James [name] MRN: [id number] DOB: 05-Mar-1971  Chief Complaint:  Abdominal pain  Subjective:  Started on PD overnight per patient after coming upstairs and is still connected.  abdominal pain is better.  He tolerated fills whereas had not at home.  States that he has not been given mircera due to diagnosis of prostate cancer.   Review of systems:  Denies shortness of breath or chest pain Diarrhea yesterday Some nausea - no emesis today   --------- Background on consult: James [name] is a 48 y.o. male with a history of end-stage renal disease on peritoneal dialysis through the [facility] facility who presented to the ER with abdominal pain.  The pain is similar to prior episode of peritonitis.  States that he has had this pain progressively worsening over the past few weeks.  Also states that he has had n/v and diarrhea for weeks.  Nephrology was consulted.  Peritoneal fluid cell count and culture were obtained with greater than 1000 white cells.  He was ordered for vancomycin 1 g and cefepime 1 gram in the ER.  Spoke with the home training nurse at [facility] and there is no way to get him peritoneal antibiotics before Monday.  Patient states that he did not make it through the first exchange secondary to pain he did not fill past 300 mL.  He states the HD nurse just drew off of his existing dwell from last night when obtaining the culture today.   SARS coronavirus 2 negative.  I have reviewed the patient's current medications.  He specifically confirmed his antihypertensives and antirejection meds and bone mineral medication.  States that he doesn't always take his potassium.  CT was suggestive of possible colitis with minimal colonic wall thickening   He follows with Dr. Oneita Hurt.  He normally does 5 exchanges of 2.5% dextrose over 9 hours.  He has a 2 L fill volume.  He normally has a last bag fill which he thinks is 1  L.   Intake/Output Summary (Last 24 hours) at 03/15/2019 1115 Last data filed at 03/14/2019 2230 Gross per 24 hour  Intake 360 ml  Output -  Net 360 ml    Vitals:  Vitals:   03/14/19 2205 03/15/19 0011 03/15/19 0528 03/15/19 0900  BP:  103/69 (!) 101/57 109/62  Pulse:  98 (!) 103 94  Resp:  18 18 18   Temp: 97.9 F (36.6 C) 98.5 F (36.9 C) 98 F (36.7 C) 98 F (36.7 C)  TempSrc: Oral Oral  Oral  SpO2:  97% 97% 98%  Weight: 77.6 kg 75.1 kg    Height:  5\' 6"  (1.676 m)       Physical Exam:  General adult male in bed in no acute distress HEENT normocephalic atraumatic extraocular movements intact sclera anicteric Neck supple trachea midline Lungs clear to auscultation bilaterally normal work of breathing at rest  Heart tachycardia no rub  Abdomen soft and less tender to palpation; connected to PD; tenckhoff dressed Extremities no lower extremity edema  Psych normal mood and affect   Medications reviewed   Labs:  BMP Latest Ref Rng & Units 03/15/2019 03/14/2019 09/23/2017  Glucose 70 - 99 mg/dL 114(H) 87 125(H)  BUN 6 - 20 mg/dL 52(H) 41(H) 59(H)  Creatinine 0.61 - 1.24 mg/dL 19.87(H) 19.69(H) 11.23(H)  Sodium 135 - 145 mmol/L 134(L) 136 141  Potassium 3.5 - 5.1 mmol/L 4.7 4.7 4.5  Chloride 98 - 111  mmol/L 90(L) 91(L) 111  CO2 22 - 32 mmol/L 27 28 16(L)  Calcium 8.9 - 10.3 mg/dL 9.1 9.6 8.8(L)     Assessment/Plan:   # Peritonitis  - associated with PD.  No organisms on gram stan and culture no growth under 24 hours - s/p vancomycin 1 gram in ER - Cefepime 1 gram IV daily for now - vanc therapeutic this AM; repeat vanc dose on on 8/24 1 gram  - Follow-up culture - Have requested repeat cell count   # Abdominal pain  - cell count suggestive of peritonitis however patient also with n/v/d  - note CT with concern for possible colitis - defer to primary team  - would avoid morphine for pain given ESRD   # ESRD  - Continue PD per routine.  Continue 1.5% solution  tonight   - would not resume home potassium as K 4.7  # HTN  - hypotensive here and normally on multiple meds at home - Discussed with team.  Would hold medications and prioritize his beta blocker when meds are added back  - 1.5% solution tonight  # Secondary hyperpara  - Continue home calcitriol and resume [place]  # Hx renal transplant - Continue current immunosuppression regimen   # Anemia 2/2 CKD - no acute indication for PRBC's - patient states not on mircera/ESA due to prostate cancer   Claudia Desanctis, MD 03/15/2019 11:15 AM

## 2019-03-16 LAB — RENAL FUNCTION PANEL
Albumin: 2.6 g/dL — ABNORMAL LOW (ref 3.5–5.0)
Anion gap: 16 — ABNORMAL HIGH (ref 5–15)
BUN: 47 mg/dL — ABNORMAL HIGH (ref 6–20)
CO2: 27 mmol/L (ref 22–32)
Calcium: 9.1 mg/dL (ref 8.9–10.3)
Chloride: 90 mmol/L — ABNORMAL LOW (ref 98–111)
Creatinine, Ser: 19.55 mg/dL — ABNORMAL HIGH (ref 0.61–1.24)
GFR calc Af Amer: 3 mL/min — ABNORMAL LOW (ref >60)
GFR calc non Af Amer: 2 mL/min — ABNORMAL LOW (ref >60)
Glucose, Bld: 143 mg/dL — ABNORMAL HIGH (ref 70–99)
Phosphorus: 7.2 mg/dL — ABNORMAL HIGH (ref 2.5–4.6)
Potassium: 4.4 mmol/L (ref 3.5–5.1)
Sodium: 133 mmol/L — ABNORMAL LOW (ref 135–145)

## 2019-03-16 LAB — CBC
HCT: 20.9 % — ABNORMAL LOW (ref 39.0–52.0)
Hemoglobin: 7.4 g/dL — ABNORMAL LOW (ref 13.0–17.0)
MCH: 38.3 pg — ABNORMAL HIGH (ref 26.0–34.0)
MCHC: 35.4 g/dL (ref 30.0–36.0)
MCV: 108.3 fL — ABNORMAL HIGH (ref 80.0–100.0)
Platelets: 91 10*3/uL — ABNORMAL LOW (ref 150–400)
RBC: 1.93 MIL/uL — ABNORMAL LOW (ref 4.22–5.81)
RDW: 13 % (ref 11.5–15.5)
WBC: 4 10*3/uL (ref 4.0–10.5)
nRBC: 0 % (ref 0.0–0.2)

## 2019-03-16 LAB — PATHOLOGIST SMEAR REVIEW

## 2019-03-16 LAB — GLUCOSE, CAPILLARY
Glucose-Capillary: 108 mg/dL — ABNORMAL HIGH (ref 70–99)
Glucose-Capillary: 158 mg/dL — ABNORMAL HIGH (ref 70–99)
Glucose-Capillary: 102 mg/dL — ABNORMAL HIGH (ref 70–99)
Glucose-Capillary: 148 mg/dL — ABNORMAL HIGH (ref 70–99)

## 2019-03-16 LAB — BODY FLUID CELL COUNT WITH DIFFERENTIAL: Total Nucleated Cell Count, Fluid: 4 cu mm (ref 0–1000)

## 2019-03-16 MED ORDER — VANCOMYCIN VARIABLE DOSE PER UNSTABLE RENAL FUNCTION (PHARMACIST DOSING): Status: DC

## 2019-03-16 MED ORDER — PROMETHAZINE HCL 25 MG/ML IJ SOLN
25.0000 mg | Freq: Four times a day (QID) | INTRAMUSCULAR | Status: DC | PRN
  Administered 2019-03-16: 25 mg via INTRAVENOUS
  Filled 2019-03-16: qty 1

## 2019-03-16 MED ORDER — METOPROLOL TARTRATE 100 MG PO TABS
100.0000 mg | ORAL_TABLET | Freq: Two times a day (BID) | ORAL | Status: DC
  Administered 2019-03-16 – 2019-03-17 (×2): 100 mg via ORAL
  Filled 2019-03-16 (×2): qty 1

## 2019-03-16 MED ORDER — LACTULOSE 10 GM/15ML PO SOLN
20.0000 g | Freq: Every day | ORAL | Status: DC
  Administered 2019-03-16 – 2019-03-17 (×2): 20 g via ORAL
  Filled 2019-03-16 (×2): qty 30

## 2019-03-16 NOTE — Progress Notes (Signed)
  Date: 03/16/2019  Patient name: Benedetto Ryder [facility]  Medical record number: [id number]  Date of birth: 02/16/1971   I have seen and evaluated this patient and I have discussed the plan of care with the house staff. Please see their note for complete details. I concur with their findings with the following additions/corrections:   Abdominal pain has improved, cell count down to 4. No further hypotension, possible sepsis has resolved.  Discussed with Dr. Justin Mend, plan to transition to intraperitoneal antibiotics. Discussed with [facility], they will arrange after discharge, but asked [place] to dose with intraperitoneal here before discharge.  He does have nausea and dizziness today, he is concerned his PD is not effective. He notes his creatinine is 19, which is high for him. Dr. Justin Mend recommended checking his fluid in a couple weeks to ensure PD is adequately dialyzing, may need to consider HD if not.   Plan for discharge tomorrow.  Lenice Pressman, M.D., Ph.D. 03/16/2019, 3:41 PM

## 2019-03-16 NOTE — Progress Notes (Signed)
Pharmacy Antibiotic Note  James [name] is a 48 y.o. male admitted on 03/13/2019 with abdominal pain and concern for peritonitis. Pharmacy has been consulted for Vancomycin + Cefepime dosing.   The patient has continued on CCPD this admission. A Vancomycin random level on 8/23 AM was 20 mcg/ml - the patient has received an additional Vancomycin 1g dose this AM. Daily Cefepime doses remain appropriate.   Plan: - Vanc 1g x 1 dose this AM - No standing Vancomycin for now, will recheck a level in ~3 days - Continue Cefepime 1g IV every 24 hours - Will continue to follow PD schedule/duration, culture results, LOT, and antibiotic de-escalation plans   Height: 5\' 6"  (167.6 cm) Weight: 171 lb 15.3 oz (78 kg)(standing wt) IBW/kg (Calculated) : 63.8  Temp (24hrs), Avg:98.3 F (36.8 C), Min:98.1 F (36.7 C), Max:98.6 F (37 C)  Recent Labs  Lab 03/14/19 0049 03/14/19 0111 03/15/19 0525 03/16/19 0557  WBC  --  7.2 4.9 4.0  CREATININE  --  19.69* 19.87* 19.55*  LATICACIDVEN 1.7  --   --   --   VANCORANDOM  --   --  20  --     Estimated Creatinine Clearance: 4.6 mL/min (A) (by C-G formula based on SCr of 19.55 mg/dL (H)).    Allergies  Allergen Reactions  . Darvon [Propoxyphene] Rash    Broke out on eyelid   . Tramadol Rash    Cefepime 8/22 >> Vanc 8/22 >> - 8/23 VR: 20 mcg/ml  8/22 COVID >> neg 8/22 peritoneal fluid >> GS neg >> ngx2d 8/22 UCx >> NG 8/22 BCx >> ngtd  Thank you for allowing pharmacy to be a part of this patient's care.  Alycia Rossetti, PharmD, BCPS Clinical Pharmacist Clinical phone for 03/16/2019: [phone] 03/16/2019 11:25 AM   **Pharmacist phone directory can now be found on [email or web address] ([email or web address]).  Listed under [place].

## 2019-03-16 NOTE — Progress Notes (Signed)
[facility] ROUNDING NOTE   Subjective:   A 48 year old gentleman history of end-stage renal disease peritoneal dialysis [place].  Was admitted with peritonitis peritoneal fluid cell count and culture obtained greater than 1000 white blood cells.  Treated with vancomycin 1 g cefepime 1 g.  Follows with Dr. Oneita Hurt 5 exchanges 2.5% dextrose 9 hours 2 L fill volumes.  Blood pressure 135/83 pulse 96 temperature 98.1 O2 sats 98% room air  Sodium 133 potassium 4.4 chloride 90 CO2 27 BUN 47 creatinine 19.5 glucose 143 calcium 9.3 phosphorus 7.2 albumin 2.6 WBC 4.0 hemoglobin 10.4 platelets 91  Calcitrol 0.75 mcg daily, Protonix 40 mg daily prednisone 5 mg daily tacrolimus 0.5 mg daily cefepime 1 g every 24 hours.  Vancomycin 1 g 03/16/2019.  Auryxia with meals  Objective:  Vital signs in last 24 hours:  Temp:  [98.1 F (36.7 C)-98.6 F (37 C)] 98.1 F (36.7 C) (08/24 1009) Pulse Rate:  [96-103] 96 (08/24 1009) Resp:  [16-18] 18 (08/24 1009) BP: (136-157)/(73-90) 155/83 (08/24 1009) SpO2:  [93 %-98 %] 98 % (08/24 1009) Weight:  [76.3 kg-78 kg] 78 kg (08/23 1845)  Weight change: -1.31 kg Filed Weights   03/15/19 0011 03/15/19 1310 03/15/19 1845  Weight: 75.1 kg 76.3 kg 78 kg    Intake/Output: I/O last 3 completed shifts: In: 280 [P.O.:180; IV Piggyback:100] Out: -    Intake/Output this shift:  No intake/output data recorded.  General adult male in bed in no acute distress HEENT normocephalic atraumatic extraocular movements intact sclera anicteric Neck supple trachea midline Lungs clear to auscultation bilaterally normal work of breathing at rest  Heart tachycardia no rub  Abdomen soft and less tender to palpation; connected to PD; tenckhoff dressed Extremities no lower extremity edema  Psych normal mood and affect   Basic Metabolic Panel: Recent Labs  Lab 03/14/19 0111 03/15/19 0525 03/16/19 0557  NA 136 134* 133*  K 4.7 4.7 4.4  CL 91* 90* 90*  CO2  28 27 27   GLUCOSE 87 114* 143*  BUN 41* 52* 47*  CREATININE 19.69* 19.87* 19.55*  CALCIUM 9.6 9.1 9.1  PHOS  --  7.8* 7.2*    Liver Function Tests: Recent Labs  Lab 03/14/19 0111 03/15/19 0525 03/16/19 0557  AST 17  --   --   ALT 18  --   --   ALKPHOS 67  --   --   BILITOT 0.8  --   --   PROT 6.9  --   --   ALBUMIN 3.1* 2.7* 2.6*   No results for input(s): LIPASE, AMYLASE in the last 168 hours. No results for input(s): AMMONIA in the last 168 hours.  CBC: Recent Labs  Lab 03/14/19 0111 03/15/19 0525 03/16/19 0557  WBC 7.2 4.9 4.0  NEUTROABS 5.7  --   --   HGB 9.2* 7.7* 7.4*  HCT 26.9* 22.3* 20.9*  MCV 111.6* 110.9* 108.3*  PLT 130* 102* 91*    Cardiac Enzymes: No results for input(s): CKTOTAL, CKMB, CKMBINDEX, TROPONINI in the last 168 hours.  BNP: Invalid input(s): POCBNP  CBG: Recent Labs  Lab 03/15/19 0720 03/15/19 1115 03/15/19 1606 03/15/19 2155 03/16/19 0702  GLUCAP 104* 121* 92 155* 108*    Microbiology: Results for orders placed or performed during the hospital encounter of 03/13/19  SARS Coronavirus 2 [facility] order, Performed in [facility] hospital lab) Nasopharyngeal Nasopharyngeal Swab     Status: None   Collection Time: 03/14/19 12:37 AM   Specimen:  Nasopharyngeal Swab  Result Value Ref Range Status   SARS Coronavirus 2 NEGATIVE NEGATIVE Final    Comment: (NOTE) If result is NEGATIVE SARS-CoV-2 target nucleic acids are NOT DETECTED. The SARS-CoV-2 RNA is generally detectable in upper and lower  respiratory specimens during the acute phase of infection. The lowest  concentration of SARS-CoV-2 viral copies this assay can detect is 250  copies / mL. A negative result does not preclude SARS-CoV-2 infection  and should not be used as the sole basis for treatment or other  patient management decisions.  A negative result may occur with  improper specimen collection / handling, submission of specimen other  than nasopharyngeal swab,  presence of viral mutation(s) within the  areas targeted by this assay, and inadequate number of viral copies  (<250 copies / mL). A negative result must be combined with clinical  observations, patient history, and epidemiological information. If result is POSITIVE SARS-CoV-2 target nucleic acids are DETECTED. The SARS-CoV-2 RNA is generally detectable in upper and lower  respiratory specimens dur ing the acute phase of infection.  Positive  results are indicative of active infection with SARS-CoV-2.  Clinical  correlation with patient history and other diagnostic information is  necessary to determine patient infection status.  Positive results do  not rule out bacterial infection or co-infection with other viruses. If result is PRESUMPTIVE POSTIVE SARS-CoV-2 nucleic acids MAY BE PRESENT.   A presumptive positive result was obtained on the submitted specimen  and confirmed on repeat testing.  While 2019 novel coronavirus  (SARS-CoV-2) nucleic acids may be present in the submitted sample  additional confirmatory testing may be necessary for epidemiological  and / or clinical management purposes  to differentiate between  SARS-CoV-2 and other Sarbecovirus currently known to infect humans.  If clinically indicated additional testing with an alternate test  methodology [phone]) is advised. The SARS-CoV-2 RNA is generally  detectable in upper and lower respiratory sp ecimens during the acute  phase of infection. The expected result is Negative. Fact Sheet for Patients:  StrictlyIdeas.no Fact Sheet for Healthcare Providers: BankingDealers.co.za This test is not yet approved or cleared by the [place] FDA and has been authorized for detection and/or diagnosis of SARS-CoV-2 by FDA under an Emergency Use Authorization (EUA).  This EUA will remain in effect (meaning this test can be used) for the duration of the COVID-19 declaration under  Section 564(b)(1) of the Act, 21 U.S.C. section 360bbb-3(b)(1), unless the authorization is terminated or revoked sooner. Performed at [facility], [place] [address], [place], [place] [address]   Blood Culture (routine x 2)     Status: None (Preliminary result)   Collection Time: 03/14/19  1:11 AM   Specimen: BLOOD LEFT HAND  Result Value Ref Range Status   Specimen Description BLOOD LEFT HAND  Final   Special Requests   Final    BOTTLES DRAWN AEROBIC AND ANAEROBIC Blood Culture adequate volume   Culture   Final    NO GROWTH 2 DAYS Performed at [facility], [place] [address], [place], [place] [address]    Report Status PENDING  Incomplete  Blood Culture (routine x 2)     Status: None (Preliminary result)   Collection Time: 03/14/19  1:11 AM   Specimen: BLOOD LEFT HAND  Result Value Ref Range Status   Specimen Description BLOOD LEFT HAND  Final   Special Requests   Final    BOTTLES DRAWN AEROBIC AND ANAEROBIC Blood Culture adequate volume  Culture   Final    NO GROWTH 2 DAYS Performed at [facility], [place] [address] [place], [place] [address]    Report Status PENDING  Incomplete  Culture, body fluid-bottle     Status: None (Preliminary result)   Collection Time: 03/14/19  6:40 AM   Specimen: Peritoneal Washings  Result Value Ref Range Status   Specimen Description PERITONEAL  Final   Special Requests NONE  Final   Culture   Final    NO GROWTH 2 DAYS Performed at [facility], [address] [place], [place] [address]    Report Status PENDING  Incomplete  Gram stain     Status: None   Collection Time: 03/14/19  6:40 AM   Specimen: Peritoneal Washings  Result Value Ref Range Status   Specimen Description PERITONEAL  Final   Special Requests NONE  Final   Gram Stain   Final    RARE WBC PRESENT,BOTH PMN AND MONONUCLEAR NO ORGANISMS SEEN Performed at [facility], [address] [place], [place] [address]    Report Status 03/14/2019 FINAL   Final  Urine culture     Status: None   Collection Time: 03/14/19  3:15 PM   Specimen: In/Out Cath Urine  Result Value Ref Range Status   Specimen Description IN/OUT CATH URINE  Final   Special Requests   Final    NONE Performed at [facility], [place] [address] [place], [place] [address]    Culture   Final    Multiple bacterial morphotypes present, none predominant. Suggest appropriate recollection if clinically indicated.   Report Status 03/15/2019 FINAL  Final    Coagulation Studies: Recent Labs    03/14/19 0111  LABPROT 12.6  INR 1.0    Urinalysis: Recent Labs    03/14/19 1525  COLORURINE YELLOW  LABSPEC 1.014  PHURINE 8.0  GLUCOSEU NEGATIVE  HGBUR NEGATIVE  BILIRUBINUR NEGATIVE  KETONESUR NEGATIVE  PROTEINUR >=300*  NITRITE NEGATIVE  LEUKOCYTESUR NEGATIVE      Imaging: No results found.   Medications:   . ceFEPime (MAXIPIME) IV 1 g (03/16/19 0958)  . dialysis solution 1.5% low-MG/low-CA    . dialysis solution 1.5% low-MG/low-CA    . vancomycin     . calcitRIOL  0.75 mcg Oral Daily  . ferric citrate  210 mg Oral Q supper  . gentamicin cream  1 application Topical Daily  . gentamicin cream  1 application Topical Daily  . heparin  5,000 Units Subcutaneous Q8H  . insulin aspart  0-5 Units Subcutaneous QHS  . insulin aspart  0-9 Units Subcutaneous TID WC  . insulin aspart  3 Units Subcutaneous TID WC  . lactulose  20 g Oral Daily  . latanoprost  1 drop Both Eyes QHS  . pantoprazole  40 mg Oral Daily  . predniSONE  5 mg Oral Q breakfast  . tacrolimus  0.5 mg Oral Daily   acetaminophen **OR** acetaminophen, dianeal solution for CAPD/CCPD with heparin, HYDROmorphone, ondansetron (ZOFRAN) IV, zolpidem  Assessment/ Plan:   Peritonitis no organisms seen on Gram stain no culture results.  Status post vancomycin cefepime 1 g daily continues to have clear fluid.  Abdominal pain resolved  Abdominal pain secondary to peritonitis CT scan possible  colitis defer to primary team  End-stage renal disease continue peritoneal dialysis  Hypertension/volume holding antihypertensive medications at this time  Status post renal transplantation with failure patient on prednisone 5 mg daily chronic tacrolimus 0.5 mg  daily  Secondary hyperparathyroidism continue Auryxia and calcitriol  Anemia no indication for red blood cell transfusion history of prostate cancer   LOS: 2 Sherril Croon @TODAY @10 :41 AM

## 2019-03-16 NOTE — Progress Notes (Signed)
Subjective: Patient evaluated at bedside this morning. His abdominal pain is improved but feels dizzy with movement and doesn't feel like he is at baseline. He is tolerating PD and states that abdominal pain is better when during filling stage. Discussed diagnosis of peritonitis and that we are awaiting cultures for narrowing of antibiotics for intrapertioneal antibiotics and repeat peritoneal WBC count.   Objective:  Vital signs in last 24 hours: Vitals:   03/15/19 2157 03/16/19 0514 03/16/19 0805 03/16/19 1009  BP: 140/73 137/79 (!) 157/90 (!) 155/83  Pulse: 97 (!) 102 (!) 103 96  Resp: 18 18 18 18   Temp: 98.2 F (36.8 C) 98.3 F (36.8 C) 98.4 F (36.9 C) 98.1 F (36.7 C)  TempSrc: Oral Oral Oral Oral  SpO2: 97%  97% 98%  Weight:      Height:       Physical Exam Constitutional:      General: He is not in acute distress.    Appearance: He is well-developed.  Cardiovascular:     Rate and Rhythm: Normal rate and regular rhythm.     Heart sounds: Murmur present. No friction rub. No gallop.      Comments: Systolic ejection murmur  Pulmonary:     Effort: Pulmonary effort is normal. No respiratory distress.     Breath sounds: Normal breath sounds. No stridor. No wheezing, rhonchi or rales.  Abdominal:     General: Bowel sounds are normal. There is no distension.     Tenderness: There is no abdominal tenderness.  Neurological:     General: No focal deficit present.     Mental Status: He is alert and oriented to person, place, and time.      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 Peritoneal WBC count >1000 in peritoneal fluid without organisms on gram stain, patient started on IV vancomycin and cefepime.Cultures with no growth to date. Repeat cell count WBC 4.   Peritonitisassociated with peritoneal dialysis: Patient with ESRD on PD presenting with worsening  abdominal pain during PD session on 8/21. Pain similar to previous episode of peritonitis and peritoneal fluid analysis with >1000 WBC consistent with peritonitis. Patient started on vancomycin and cefepime in ED. Patient remains afebrile and no leukocytosis noted on labs. Abdominal pain improved. Blood and peritoneal fluid cultures with no growth to date. Repeat cell count of peritoneal fluid with WBC 4.  Intraperitoneal antibiotics per nephrology   - PD and intraperitoneal antbiotics per nephrology  - Continue cefepime 1g IV qd and vancomycin intermittent dosing per pharmacy  ESRD on PD: Patient with history of ESRD on HD after failed renal transplant. Patient will continue with PD routine per nephrology. BUN/ Cr 47/19.5 this AM despite receiving PD overnight. Patient may need to transition to HD if PD is not working. However, he wishes to discus this further with his outpatient nephrologist.   - PD per nephrology  - Hold home K per nephrology recommendations  Hypertension: Patient on amlodipine 5mg  qd and metoprolol 100mg  bid at home. Given hypotension on presentation, BP meds were held. BP this AM 157/90. Will restart metoprolol home dose   - Metoprolol 100mg  bid   Secondary hyperparathyroidism:  Ca 9.1, Phos 7.8 Secondary to ESRD.   - Continue home calcitriol and auryxia.   Acute anemia on chronic macrocytic anemia: Likely secondary to ESRD. Hb 7.4 this AM  MCV 108 Like due to  hemodynamic fluid shift. Patient denies any headache, dizziness, generalized weakness, chest pain or shortness of breath. No signs of overt bleeding noted. Will continue to monitor and transfuse as needed.   - Continue to monitor with CBC  - F/u B12 and Folate levels    Hx of renal transplant: Patient w/ hx of renal transplant 30yrs ago on immunosuppressants.  -Continue tacrolimus 0.5mg  qd   Anxiety:  - Contiue Ambien 10mg  qHS prn   Dispo: Anticipated discharge in approximately 0-1 day(s).    Harvie Heck, MD  Internal Medicine PGY-1 03/16/2019, 10:12 AM Pager: [phone]

## 2019-03-16 NOTE — Plan of Care (Signed)
  Problem: Activity: Goal: Risk for activity intolerance will decrease Outcome: Progressing   

## 2019-03-17 DIAGNOSIS — K652 Spontaneous bacterial peritonitis: Secondary | ICD-10-CM

## 2019-03-17 LAB — CBC
HCT: 19.8 % — ABNORMAL LOW (ref 39.0–52.0)
Hemoglobin: 7.1 g/dL — ABNORMAL LOW (ref 13.0–17.0)
MCH: 38.6 pg — ABNORMAL HIGH (ref 26.0–34.0)
MCHC: 35.9 g/dL (ref 30.0–36.0)
MCV: 107.6 fL — ABNORMAL HIGH (ref 80.0–100.0)
Platelets: 96 10*3/uL — ABNORMAL LOW (ref 150–400)
RBC: 1.84 MIL/uL — ABNORMAL LOW (ref 4.22–5.81)
RDW: 13.2 % (ref 11.5–15.5)
WBC: 4.1 10*3/uL (ref 4.0–10.5)
nRBC: 0 % (ref 0.0–0.2)

## 2019-03-17 LAB — RENAL FUNCTION PANEL
Albumin: 2.6 g/dL — ABNORMAL LOW (ref 3.5–5.0)
Anion gap: 16 — ABNORMAL HIGH (ref 5–15)
BUN: 50 mg/dL — ABNORMAL HIGH (ref 6–20)
CO2: 26 mmol/L (ref 22–32)
Calcium: 9.2 mg/dL (ref 8.9–10.3)
Chloride: 94 mmol/L — ABNORMAL LOW (ref 98–111)
Creatinine, Ser: 19.9 mg/dL — ABNORMAL HIGH (ref 0.61–1.24)
GFR calc Af Amer: 3 mL/min — ABNORMAL LOW (ref >60)
GFR calc non Af Amer: 2 mL/min — ABNORMAL LOW (ref >60)
Glucose, Bld: 119 mg/dL — ABNORMAL HIGH (ref 70–99)
Phosphorus: 7.7 mg/dL — ABNORMAL HIGH (ref 2.5–4.6)
Potassium: 4.3 mmol/L (ref 3.5–5.1)
Sodium: 136 mmol/L (ref 135–145)

## 2019-03-17 LAB — GLUCOSE, CAPILLARY
Glucose-Capillary: 113 mg/dL — ABNORMAL HIGH (ref 70–99)
Glucose-Capillary: 157 mg/dL — ABNORMAL HIGH (ref 70–99)

## 2019-03-17 LAB — PATHOLOGIST SMEAR REVIEW

## 2019-03-17 LAB — HEPATITIS B SURFACE ANTIGEN: Hepatitis B Surface Ag: NEGATIVE

## 2019-03-17 NOTE — Plan of Care (Signed)
°  Problem: Coping: °Goal: Level of anxiety will decrease °Outcome: Progressing °  °

## 2019-03-17 NOTE — Progress Notes (Signed)
[place] to be discharged home per MD order. Discussed prescriptions and follow up appointments with the patient. Prescriptions given to patient; medication list explained in detail. Patient verbalized understanding.  Skin clean, dry and intact without evidence of skin break down, no evidence of skin tears noted. IV catheter discontinued intact. Site without signs and symptoms of complications. Dressing and pressure applied. Pt denies pain at the site currently. No complaints noted.  Patient free of lines, drains, and wounds.   An After Visit Summary (AVS) was printed and given to the patient. Patient escorted via wheelchair, and discharged home via private auto.  Baldo Ash, RN

## 2019-03-17 NOTE — Progress Notes (Signed)
[facility] ROUNDING NOTE   Subjective:   A 48 year old gentleman history of end-stage renal disease peritoneal dialysis [place].  Was admitted with peritonitis peritoneal fluid cell count and culture obtained greater than 1000 white blood cells.  Treated with vancomycin 1 g cefepime 1 g.  Follows with Dr. Oneita Hurt 5 exchanges 2.5% dextrose 9 hours 2 L fill volumes.  Peritoneal fluid now appears clear.  He informs me he has had recurrent peritonitis and is contemplating converting to hemo-.  He has a history of prostate cancer and is being evaluated by urologist in [facility].  He also has been referred for second opinion to a urologist at [facility].  He would like to be back on the transplant list.  Blood pressure 160/96 pulse 92 temperature 98.2 O2 sats 100% room air  Sodium 136 potassium 4.3 chloride 94 CO2 26 BUN 50 creatinine 19.9 glucose 119.  Calcium 9.2 phosphorus 7.7 albumin 2.6.  WBC 4.1 hemoglobin 7.1 platelets 96  Calcitrol 0.75 mcg daily, Protonix 40 mg daily prednisone 5 mg daily tacrolimus 0.5 mg daily cefepime 1 g every 24 hours.  Vancomycin 1 g 03/16/2019.  Auryxia with meals.  Metoprolol 100 mg twice daily  Objective:  Vital signs in last 24 hours:  Temp:  [97.7 F (36.5 C)-98.5 F (36.9 C)] 98.2 F (36.8 C) (08/25 0800) Pulse Rate:  [87-100] 100 (08/25 0800) Resp:  [16-18] 18 (08/25 0800) BP: (131-160)/(83-96) 160/96 (08/25 0800) SpO2:  [96 %-100 %] 100 % (08/25 0800) Weight:  [74.5 kg-80.5 kg] 80.5 kg (08/25 0438)  Weight change: -0.7 kg Filed Weights   03/16/19 0830 03/16/19 1806 03/17/19 0438  Weight: 75.6 kg 74.5 kg 80.5 kg    Intake/Output: I/O last 3 completed shifts: In: 660 [P.O.:360; IV Piggyback:300] Out: 52 [Urine:50; Emesis/NG output:2]   Intake/Output this shift:  No intake/output data recorded.  General adult male in bed in no acute distress HEENT normocephalic atraumatic extraocular movements intact sclera anicteric Neck  supple trachea midline Lungs clear to auscultation bilaterally normal work of breathing at rest  Heart tachycardia no rub  Abdomen soft and less tender to palpation; connected to PD; tenckhoff dressed Extremities no lower extremity edema  Psych normal mood and affect   Basic Metabolic Panel: Recent Labs  Lab 03/14/19 0111 03/15/19 0525 03/16/19 0557 03/17/19 0727  NA 136 134* 133* 136  K 4.7 4.7 4.4 4.3  CL 91* 90* 90* 94*  CO2 28 27 27 26   GLUCOSE 87 114* 143* 119*  BUN 41* 52* 47* 50*  CREATININE 19.69* 19.87* 19.55* 19.90*  CALCIUM 9.6 9.1 9.1 9.2  PHOS  --  7.8* 7.2* 7.7*    Liver Function Tests: Recent Labs  Lab 03/14/19 0111 03/15/19 0525 03/16/19 0557 03/17/19 0727  AST 17  --   --   --   ALT 18  --   --   --   ALKPHOS 67  --   --   --   BILITOT 0.8  --   --   --   PROT 6.9  --   --   --   ALBUMIN 3.1* 2.7* 2.6* 2.6*   No results for input(s): LIPASE, AMYLASE in the last 168 hours. No results for input(s): AMMONIA in the last 168 hours.  CBC: Recent Labs  Lab 03/14/19 0111 03/15/19 0525 03/16/19 0557 03/17/19 0727  WBC 7.2 4.9 4.0 4.1  NEUTROABS 5.7  --   --   --   HGB 9.2* 7.7*  7.4* 7.1*  HCT 26.9* 22.3* 20.9* 19.8*  MCV 111.6* 110.9* 108.3* 107.6*  PLT 130* 102* 91* 96*    Cardiac Enzymes: No results for input(s): CKTOTAL, CKMB, CKMBINDEX, TROPONINI in the last 168 hours.  BNP: Invalid input(s): POCBNP  CBG: Recent Labs  Lab 03/16/19 0702 03/16/19 1152 03/16/19 1626 03/16/19 2044 03/17/19 0653  GLUCAP 108* 158* 102* 148* 113*    Microbiology: Results for orders placed or performed during the hospital encounter of 03/13/19  SARS Coronavirus 2 [facility] order, Performed in [facility] hospital lab) Nasopharyngeal Nasopharyngeal Swab     Status: None   Collection Time: 03/14/19 12:37 AM   Specimen: Nasopharyngeal Swab  Result Value Ref Range Status   SARS Coronavirus 2 NEGATIVE NEGATIVE Final    Comment: (NOTE) If result is  NEGATIVE SARS-CoV-2 target nucleic acids are NOT DETECTED. The SARS-CoV-2 RNA is generally detectable in upper and lower  respiratory specimens during the acute phase of infection. The lowest  concentration of SARS-CoV-2 viral copies this assay can detect is 250  copies / mL. A negative result does not preclude SARS-CoV-2 infection  and should not be used as the sole basis for treatment or other  patient management decisions.  A negative result may occur with  improper specimen collection / handling, submission of specimen other  than nasopharyngeal swab, presence of viral mutation(s) within the  areas targeted by this assay, and inadequate number of viral copies  (<250 copies / mL). A negative result must be combined with clinical  observations, patient history, and epidemiological information. If result is POSITIVE SARS-CoV-2 target nucleic acids are DETECTED. The SARS-CoV-2 RNA is generally detectable in upper and lower  respiratory specimens dur ing the acute phase of infection.  Positive  results are indicative of active infection with SARS-CoV-2.  Clinical  correlation with patient history and other diagnostic information is  necessary to determine patient infection status.  Positive results do  not rule out bacterial infection or co-infection with other viruses. If result is PRESUMPTIVE POSTIVE SARS-CoV-2 nucleic acids MAY BE PRESENT.   A presumptive positive result was obtained on the submitted specimen  and confirmed on repeat testing.  While 2019 novel coronavirus  (SARS-CoV-2) nucleic acids may be present in the submitted sample  additional confirmatory testing may be necessary for epidemiological  and / or clinical management purposes  to differentiate between  SARS-CoV-2 and other Sarbecovirus currently known to infect humans.  If clinically indicated additional testing with an alternate test  methodology [id number]) is advised. The SARS-CoV-2 RNA is generally  detectable  in upper and lower respiratory sp ecimens during the acute  phase of infection. The expected result is Negative. Fact Sheet for Patients:  StrictlyIdeas.no Fact Sheet for Healthcare Providers: BankingDealers.co.za This test is not yet approved or cleared by the [place] FDA and has been authorized for detection and/or diagnosis of SARS-CoV-2 by FDA under an Emergency Use Authorization (EUA).  This EUA will remain in effect (meaning this test can be used) for the duration of the COVID-19 declaration under Section 564(b)(1) of the Act, 21 U.S.C. section 360bbb-3(b)(1), unless the authorization is terminated or revoked sooner. Performed at [facility], [place] [address] [place], [place] [address]   Blood Culture (routine x 2)     Status: None (Preliminary result)   Collection Time: 03/14/19  1:11 AM   Specimen: BLOOD LEFT HAND  Result Value Ref Range Status   Specimen Description BLOOD LEFT HAND  Final  Special Requests   Final    BOTTLES DRAWN AEROBIC AND ANAEROBIC Blood Culture adequate volume   Culture   Final    NO GROWTH 2 DAYS Performed at [facility], [place] [address] [place], [place] [address]    Report Status PENDING  Incomplete  Blood Culture (routine x 2)     Status: None (Preliminary result)   Collection Time: 03/14/19  1:11 AM   Specimen: BLOOD LEFT HAND  Result Value Ref Range Status   Specimen Description BLOOD LEFT HAND  Final   Special Requests   Final    BOTTLES DRAWN AEROBIC AND ANAEROBIC Blood Culture adequate volume   Culture   Final    NO GROWTH 2 DAYS Performed at [facility], [place] [address], [place], [place] [address]    Report Status PENDING  Incomplete  Culture, body fluid-bottle     Status: None (Preliminary result)   Collection Time: 03/14/19  6:40 AM   Specimen: Peritoneal Washings  Result Value Ref Range Status   Specimen Description PERITONEAL  Final   Special Requests NONE   Final   Culture   Final    NO GROWTH 2 DAYS Performed at [facility], [address] [place], [place] [address]    Report Status PENDING  Incomplete  Gram stain     Status: None   Collection Time: 03/14/19  6:40 AM   Specimen: Peritoneal Washings  Result Value Ref Range Status   Specimen Description PERITONEAL  Final   Special Requests NONE  Final   Gram Stain   Final    RARE WBC PRESENT,BOTH PMN AND MONONUCLEAR NO ORGANISMS SEEN Performed at [facility], [address], [place], [place] [address]    Report Status 03/14/2019 FINAL  Final  Urine culture     Status: None   Collection Time: 03/14/19  3:15 PM   Specimen: In/Out Cath Urine  Result Value Ref Range Status   Specimen Description IN/OUT CATH URINE  Final   Special Requests   Final    NONE Performed at [facility], [place] [address] [place], [place] [address]    Culture   Final    Multiple bacterial morphotypes present, none predominant. Suggest appropriate recollection if clinically indicated.   Report Status 03/15/2019 FINAL  Final    Coagulation Studies: No results for input(s): LABPROT, INR in the last 72 hours.  Urinalysis: Recent Labs    03/14/19 1525  COLORURINE YELLOW  LABSPEC 1.014  PHURINE 8.0  GLUCOSEU NEGATIVE  HGBUR NEGATIVE  BILIRUBINUR NEGATIVE  KETONESUR NEGATIVE  PROTEINUR >=300*  NITRITE NEGATIVE  LEUKOCYTESUR NEGATIVE      Imaging: No results found.   Medications:   . ceFEPime (MAXIPIME) IV 1 g (03/16/19 0958)  . dialysis solution 1.5% low-MG/low-CA    . dialysis solution 1.5% low-MG/low-CA     . calcitRIOL  0.75 mcg Oral Daily  . ferric citrate  210 mg Oral Q supper  . gentamicin cream  1 application Topical Daily  . heparin  5,000 Units Subcutaneous Q8H  . insulin aspart  0-5 Units Subcutaneous QHS  . insulin aspart  0-9 Units Subcutaneous TID WC  . insulin aspart  3 Units Subcutaneous TID WC  . lactulose  20 g Oral Daily  . latanoprost  1 drop Both  Eyes QHS  . metoprolol tartrate  100 mg Oral BID  . pantoprazole  40 mg Oral Daily  . predniSONE  5  mg Oral Q breakfast  . tacrolimus  0.5 mg Oral Daily  . vancomycin variable dose per unstable renal function (pharmacist dosing)   Does not apply See admin instructions   acetaminophen **OR** acetaminophen, dianeal solution for CAPD/CCPD with heparin, HYDROmorphone, ondansetron (ZOFRAN) IV, promethazine, zolpidem  Assessment/ Plan:   Peritonitis no organisms seen on Gram stain no culture results.  Status post vancomycin and cefepime 1 g daily continues to have clear fluid.  Abdominal pain resolved.  Bags of clear peritoneal fluid.  May be discharged with follow-up home training unit.  He will need to be dosed with antibiotics per home training.  Abdominal pain secondary to peritonitis CT scan possible colitis defer to primary team  End-stage renal disease continue peritoneal dialysis  Hypertension/volume holding antihypertensive medications at this time  Status post renal transplantation with failure patient on prednisone 5 mg daily chronic tacrolimus 0.5 mg daily  Secondary hyperparathyroidism continue Auryxia and calcitriol.  Adjustments to be made  Anemia no indication for red blood cell transfusion history of prostate cancer.  Patient does not appear to be on any darbepoetin.  We will leave this to the discretion of his home dialysis unit and his regular nephrologist  Patient may be discharged today we will need to go to his home training unit 03/18/2019 for follow-up   LOS: [place] @TODAY @8 :31 AM

## 2019-03-17 NOTE — Progress Notes (Signed)
Subjective: Patient examined at bedside this morning. His symptoms are improved and patient is ready for discharge. Discussed importance of following up with outpatient nephrologist for continued intraperitoneal antibiotics. Patient agreeable and will go see Dr. Joesph July .He also voiced that he would like to establish care with [facility] as he lives in [place] and that would be closer for him than [facility].   Objective:  Vital signs in last 24 hours: Vitals:   03/17/19 0438 03/17/19 0800 03/17/19 0823 03/17/19 0919  BP: (!) 157/92 (!) 160/96  (!) 159/85  Pulse: 93 100  99  Resp: 18 18  18   Temp: 98.4 F (36.9 C) 98.2 F (36.8 C)    TempSrc: Oral Oral    SpO2: 100% 100%  97%  Weight: 80.5 kg  75.6 kg   Height:       Physical Exam Constitutional:      General: He is not in acute distress.    Appearance: He is well-developed.  Cardiovascular:     Rate and Rhythm: Normal rate and regular rhythm.     Heart sounds: Murmur present. No friction rub. No gallop.      Comments: Systolic ejection murmur  Pulmonary:     Effort: Pulmonary effort is normal. No respiratory distress.     Breath sounds: Normal breath sounds. No wheezing, rhonchi or rales.  Abdominal:     General: There is no distension.     Palpations: Abdomen is soft.     Tenderness: There is no abdominal tenderness.  Neurological:     General: No focal deficit present.     Mental Status: He is alert and oriented to person, place, and time.     Assessment/Plan:  Patient is a 48yo male with PMHx of HTN, DM, prostate cancer, ESRD on PD after failed kidney transplant presenting withperitonitis.PeritonealWBC count >1000 in peritoneal fluidwithout organisms on gram stain, patient started on IV vancomycin and cefepime.Cultures with no growth to date. Repeat cell count WBC 4. PD and intraperitoneal antibiotics per outpatient nephrology.  Peritonitisassociated with peritoneal dialysis:  Patient with ESRD on PD presenting withworsening abdominal painduring PD session on 8/21.Pain similar toprevious episode of peritonitis and peritoneal fluid analysis with >1000 WBC consistent with peritonitis. Patient started on vancomycin and cefepime in ED. Patientremainsafebrile and no leukocytosis noted on labs. Abdominal pain improved. Blood and peritoneal fluid cultures with no growth to date. Repeat cell count of peritoneal fluid with WBC 4.  Peritoneal fluid clear this morning. Patient to follow up with nephrology for further intraperitoneal antibiotics for 2 weeks.   - PD and intraperitoneal antbiotics per outpatient nephrology   ESRD on PD: Patient with history of ESRD on HD after failed renal transplant. Patient will continue with PDroutineper nephrology.BUN/ Cr 50/19.9 this AM. Patient may need to transition to HD if PD is not working. However, he wishes to discus this further with his outpatient nephrologist.   - PD per nephrology   Hypertension: Patient on amlodipine 5mg  qd and metoprolol 100mg  bidat home. Given hypotension on presentation, BP meds were held. Patient restarted on home dose metoprolol yesterday and this AM, 159/85. - Continue home BP meds   Secondary hyperparathyroidism: Ca 9.2, Phos 7.7Secondary to ESRD. - Continue home calcitriol and auryxia.   Acute anemia on chronic macrocytic anemia: Likely secondary to ESRD.Hb 7.1 this AM  MCV 108  Patient denies any headache, dizziness, generalized weakness, chest pain or shortness of breath. No signs of overt bleeding - Continue  to monitor with CBC    Hx of renal transplant: Patient w/ hx of renal transplant 73yrs ago on immunosuppressants. -Continue tacrolimus 0.5mg  qd   Anxiety:  - Contiue Ambien 10mg  qHS prn   Dispo: Anticipated discharge in approximately 0 day(s).   Harvie Heck, MD  Internal Medicine, PGY1 03/17/2019, 11:27 AM Pager: [phone]

## 2019-03-17 NOTE — Discharge Instructions (Signed)
James [name],  You were admitted for abdominal pain with peritoneal dialysis consistent with peritonitis. Your peritoneal WBC count was >1000. We started you on IV vancomycin and cefepime with improvement in your peritoneal WBC count and your symptoms. Please follow up with your outpatient nephrologist, Dr. Oneita Hurt, for continued intraperitoneal antibiotics for 2 weeks and management of your dialysis.  Please contact us if you have any concerns.   Thank you!

## 2019-03-17 NOTE — Care Management Important Message (Signed)
Important Message  Patient Details  Name: James [name] MRN: [id number] Date of Birth: 1971-05-19   Medicare Important Message Given:  Yes     Demaryius Imran 03/17/2019, 2:05 PM

## 2019-03-17 NOTE — Discharge Summary (Signed)
Name: James [name] MRN: [id number] DOB: 1971/04/15 48 y.o. PCP: Patient, No Pcp Per  Date of Admission: 03/13/2019 11:59 PM Date of Discharge: 03/17/2019 Attending Physician: Dr. Lenice Pressman, MD,PhD  Discharge Diagnosis: 1. Bacterial peritonitis   Discharge Medications: Allergies as of 03/17/2019      Reactions   Darvon [propoxyphene] Rash   Broke out on eyelid    Tramadol Rash      Medication List    STOP taking these medications   HYDROcodone-acetaminophen 5-325 MG tablet Commonly known as: NORCO/VICODIN     TAKE these medications   acetaminophen 500 MG tablet Commonly known as: TYLENOL Take 500-1,000 mg by mouth every 6 (six) hours as needed for headache (pain).   amLODipine 5 MG tablet Commonly known as: NORVASC Take 1 tablet (5 mg total) by mouth daily. What changed: when to take this   calcitRIOL 0.25 MCG capsule Commonly known as: ROCALTROL Take 0.75 mcg by mouth daily.   Cardura 4 MG tablet Generic drug: doxazosin Take 4 mg by mouth at bedtime.   diphenhydrAMINE 25 mg capsule Commonly known as: BENADRYL Take 25 mg by mouth every 6 (six) hours as needed for itching.   ferric citrate 1 GM 210 MG(Fe) tablet Commonly known as: AURYXIA Take 210 mg by mouth daily with supper.   ferrous sulfate 325 (65 FE) MG tablet Take 325 mg by mouth 2 (two) times daily with a meal.   insulin aspart 100 UNIT/ML FlexPen Commonly known as: NovoLOG FlexPen Inject 10 Units into the skin 3 (three) times daily with meals. What changed: how much to take   Insulin Detemir 100 UNIT/ML Pen Commonly known as: Levemir FlexTouch Inject 15 Units into the skin at bedtime. What changed: when to take this   latanoprost 0.005 % ophthalmic solution Commonly known as: XALATAN Place 1 drop into both eyes at bedtime.   lisinopril 10 MG tablet Commonly known as: ZESTRIL Take 10 mg by mouth daily.   loperamide 2 MG capsule Commonly known as: IMODIUM Take 2 mg by mouth  daily as needed for diarrhea or loose stools.   Melatonin 5 MG Caps Take 5 mg by mouth at bedtime as needed (sleep).   metoprolol tartrate 100 MG tablet Commonly known as: LOPRESSOR Take 100 mg by mouth 2 (two) times daily.   multivitamin with minerals tablet Take 1 tablet by mouth 2 (two) times daily. Centrum   omeprazole 20 MG capsule Commonly known as: PRILOSEC Take 20 mg by mouth daily.   potassium chloride SA 20 MEQ tablet Commonly known as: K-DUR Take 20 mEq by mouth every Monday, Wednesday, and Friday.   predniSONE 5 MG tablet Commonly known as: DELTASONE Take 5 mg by mouth daily with breakfast.   sodium bicarbonate 650 MG tablet Take 1,300 mg by mouth 3 (three) times daily.   tacrolimus 0.5 MG capsule Commonly known as: PROGRAF Take 0.5 mg by mouth daily.   valGANciclovir 450 MG tablet Commonly known as: VALCYTE Take 450 mg by mouth See admin instructions. Take one tablet (450 mg) by mouth on Monday, Wednesday, Friday before dialysis   zolpidem 10 MG tablet Commonly known as: AMBIEN Take 10 mg by mouth at bedtime as needed for sleep.       Disposition and follow-up:   Mr.Eoin W Kotch was discharged from [facility] in Stable condition.  At the hospital follow up visit please address:  1.  Bacterial peritonitis: Patient with ESRD on PD presented with abdominal pain  similar to previous episode of peritonitis. Peritoneal fluid analysis with WBC>1000. Started on IV vancomycin and cefepime. Improvement in symptoms and labs. Repeat cell count WBC 4. Patient to follow up with nephrology for further intraperitoneal antibiotics for 2 weeks.   2.  Labs / imaging needed at time of follow-up: BMP, CBC  3.  Pending labs/ test needing follow-up: none   Follow-up Appointments: Follow-up Information    Cay Schillings, MD. Go in 1 day(s).   Specialty: Unknown Physician Specialty Contact information: [address] [place] [address]  [phone]           Hospital Course by problem list:  Peritonitisassociated with peritoneal dialysis: Patient with ESRD on PD presenting withworsening abdominal painduring PD session on 8/21.Pain similar toprevious episode of peritonitis and peritoneal fluid analysis with >1000 WBC consistent with peritonitis. Patient started on vancomycin and cefepime in ED. Patientremainsafebrile and no leukocytosis noted on labs.Abdominal pain improved.Blood and peritoneal fluidcultures with no growth to date. Repeat cell count of peritoneal fluid with WBC 4.Peritoneal fluid clear this morning. Patient to follow up with nephrology for further intraperitoneal antibiotics for 2 weeks.    ESRD on PD: Patient with history of ESRD on HD after failed renal transplant. Patient will continue with PDroutineper nephrology.BUN/Cr without significant improvement despite continued PD. Baseline Cr ~16. BUN/Cr  50/19.9 this AM. Patient may need totransition to HD if PD is not working. However, he wishes to discus this further with his outpatient nephrologist.   Acute anemia on chronic macrocytic anemia: Likely secondary to ESRD.Hb 7.1this AM MCV 108. Patient asymptomatic. Continue to monitor with CBC.   Discharge Vitals:   BP (!) 159/85 (BP Location: Left Arm)   Pulse 99   Temp 98.2 F (36.8 C) (Oral)   Resp 18   Ht 5\' 6"  (1.676 m)   Wt 75.6 kg   SpO2 97%   BMI 26.90 kg/m   Pertinent Labs, Studies, and Procedures:  03/14/2019 Peritoneal fluid analysis:  Straw colored fluid, WBC 1,214, Neutrophil count 7   03/16/2019 Peritoneal fluid analysis:  Colorless, WBC 4, Clear appearance. Other cells: too few to count   Discharge Instructions:  Discharge Instructions    Call MD for:  difficulty breathing, headache or visual disturbances   Complete by: As directed    Call MD for:  persistant dizziness or light-headedness   Complete by: As directed    Call MD for:  persistant nausea  and vomiting   Complete by: As directed    Call MD for:  severe uncontrolled pain   Complete by: As directed    Call MD for:  temperature >100.4   Complete by: As directed    Diet - low sodium heart healthy   Complete by: As directed    Discharge instructions   Complete by: As directed    Mr. Imburgia,  You were admitted for abdominal pain with peritoneal dialysis consistent with peritonitis. Your peritoneal WBC count was >1000. We started you on IV vancomycin and cefepime with improvement in your peritoneal WBC count and your symptoms. Please follow up with your outpatient nephrologist, Dr. Oneita Hurt, for continued intraperitoneal antibiotics for 2 weeks and management of your dialysis.  Please contact us if you have any concerns.   Thank you!   Increase activity slowly   Complete by: As directed       Signed: Harvie Heck, MD  Internal Medicine, PGY1  03/17/2019, 5:26 PM   Pager: [phone]

## 2019-03-17 NOTE — Plan of Care (Signed)
  Problem: Education: Goal: Knowledge of General Education information will improve Description: Including pain rating scale, medication(s)/side effects and non-pharmacologic comfort measures Outcome: Adequate for Discharge   Problem: Health Behavior/Discharge Planning: Goal: Ability to manage health-related needs will improve Outcome: Adequate for Discharge   Problem: Clinical Measurements: Goal: Ability to maintain clinical measurements within normal limits will improve Outcome: Adequate for Discharge Goal: Will remain free from infection Outcome: Adequate for Discharge Goal: Diagnostic test results will improve Outcome: Adequate for Discharge Goal: Respiratory complications will improve Outcome: Adequate for Discharge Goal: Cardiovascular complication will be avoided Outcome: Adequate for Discharge   Problem: Activity: Goal: Risk for activity intolerance will decrease Outcome: Adequate for Discharge   Problem: Nutrition: Goal: Adequate nutrition will be maintained Outcome: Adequate for Discharge   Problem: Coping: Goal: Level of anxiety will decrease 03/17/2019 1139 by Baldo Ash, RN Outcome: Adequate for Discharge 03/17/2019 0841 by Baldo Ash, RN Outcome: Progressing   Problem: Elimination: Goal: Will not experience complications related to bowel motility Outcome: Adequate for Discharge Goal: Will not experience complications related to urinary retention Outcome: Adequate for Discharge   Problem: Pain Managment: Goal: General experience of comfort will improve Outcome: Adequate for Discharge   Problem: Safety: Goal: Ability to remain free from injury will improve Outcome: Adequate for Discharge   Problem: Skin Integrity: Goal: Risk for impaired skin integrity will decrease Outcome: Adequate for Discharge

## 2019-03-19 LAB — CULTURE, BLOOD (ROUTINE X 2)
Culture: NO GROWTH
Culture: NO GROWTH
Special Requests: ADEQUATE
Special Requests: ADEQUATE

## 2019-03-19 LAB — CULTURE, BODY FLUID W GRAM STAIN -BOTTLE: Culture: NO GROWTH

## 2019-03-27 ENCOUNTER — Emergency Department (HOSPITAL_COMMUNITY): Payer: Medicare Other

## 2019-03-27 ENCOUNTER — Inpatient Hospital Stay (HOSPITAL_COMMUNITY)
Admission: EM | Admit: 2019-03-27 | Discharge: 2019-04-01 | DRG: 867 | Disposition: A | Discharge status: Home / Self Care | Payer: Medicare Other | Attending: Student in an Organized Health Care Education/Training Program | Admitting: Student in an Organized Health Care Education/Training Program

## 2019-03-27 DIAGNOSIS — D649 Anemia, unspecified: Secondary | ICD-10-CM | POA: Diagnosis not present

## 2019-03-27 DIAGNOSIS — R197 Diarrhea, unspecified: Secondary | ICD-10-CM | POA: Diagnosis not present

## 2019-03-27 DIAGNOSIS — Z992 Dependence on renal dialysis: Secondary | ICD-10-CM

## 2019-03-27 DIAGNOSIS — Z7952 Long term (current) use of systemic steroids: Secondary | ICD-10-CM

## 2019-03-27 DIAGNOSIS — D509 Iron deficiency anemia, unspecified: Secondary | ICD-10-CM | POA: Diagnosis present

## 2019-03-27 DIAGNOSIS — I959 Hypotension, unspecified: Secondary | ICD-10-CM

## 2019-03-27 DIAGNOSIS — I120 Hypertensive chronic kidney disease with stage 5 chronic kidney disease or end stage renal disease: Secondary | ICD-10-CM | POA: Diagnosis present

## 2019-03-27 DIAGNOSIS — D479 Neoplasm of uncertain behavior of lymphoid, hematopoietic and related tissue, unspecified: Secondary | ICD-10-CM | POA: Diagnosis present

## 2019-03-27 DIAGNOSIS — Z79899 Other long term (current) drug therapy: Secondary | ICD-10-CM

## 2019-03-27 DIAGNOSIS — Y830 Surgical operation with transplant of whole organ as the cause of abnormal reaction of the patient, or of later complication, without mention of misadventure at the time of the procedure: Secondary | ICD-10-CM | POA: Diagnosis present

## 2019-03-27 DIAGNOSIS — D120 Benign neoplasm of cecum: Secondary | ICD-10-CM | POA: Diagnosis present

## 2019-03-27 DIAGNOSIS — I951 Orthostatic hypotension: Secondary | ICD-10-CM | POA: Diagnosis present

## 2019-03-27 DIAGNOSIS — E876 Hypokalemia: Secondary | ICD-10-CM | POA: Diagnosis present

## 2019-03-27 DIAGNOSIS — Z886 Allergy status to analgesic agent status: Secondary | ICD-10-CM

## 2019-03-27 DIAGNOSIS — D696 Thrombocytopenia, unspecified: Secondary | ICD-10-CM | POA: Diagnosis present

## 2019-03-27 DIAGNOSIS — E861 Hypovolemia: Secondary | ICD-10-CM | POA: Diagnosis present

## 2019-03-27 DIAGNOSIS — N186 End stage renal disease: Secondary | ICD-10-CM | POA: Diagnosis present

## 2019-03-27 DIAGNOSIS — Z8572 Personal history of non-Hodgkin lymphomas: Secondary | ICD-10-CM

## 2019-03-27 DIAGNOSIS — E1122 Type 2 diabetes mellitus with diabetic chronic kidney disease: Secondary | ICD-10-CM | POA: Diagnosis present

## 2019-03-27 DIAGNOSIS — D47Z9 Other specified neoplasms of uncertain behavior of lymphoid, hematopoietic and related tissue: Secondary | ICD-10-CM | POA: Diagnosis not present

## 2019-03-27 DIAGNOSIS — K635 Polyp of colon: Secondary | ICD-10-CM | POA: Diagnosis not present

## 2019-03-27 DIAGNOSIS — Z20828 Contact with and (suspected) exposure to other viral communicable diseases: Secondary | ICD-10-CM | POA: Diagnosis present

## 2019-03-27 DIAGNOSIS — I953 Hypotension of hemodialysis: Secondary | ICD-10-CM | POA: Diagnosis present

## 2019-03-27 DIAGNOSIS — E875 Hyperkalemia: Secondary | ICD-10-CM | POA: Diagnosis present

## 2019-03-27 DIAGNOSIS — Z9889 Other specified postprocedural states: Secondary | ICD-10-CM | POA: Diagnosis not present

## 2019-03-27 DIAGNOSIS — T8029XA Infection following other infusion, transfusion and therapeutic injection, initial encounter: Principal | ICD-10-CM | POA: Diagnosis present

## 2019-03-27 DIAGNOSIS — Y841 Kidney dialysis as the cause of abnormal reaction of the patient, or of later complication, without mention of misadventure at the time of the procedure: Secondary | ICD-10-CM | POA: Diagnosis present

## 2019-03-27 DIAGNOSIS — E8889 Other specified metabolic disorders: Secondary | ICD-10-CM | POA: Diagnosis present

## 2019-03-27 DIAGNOSIS — N2581 Secondary hyperparathyroidism of renal origin: Secondary | ICD-10-CM | POA: Diagnosis present

## 2019-03-27 DIAGNOSIS — D631 Anemia in chronic kidney disease: Secondary | ICD-10-CM | POA: Diagnosis present

## 2019-03-27 DIAGNOSIS — C61 Malignant neoplasm of prostate: Secondary | ICD-10-CM | POA: Diagnosis present

## 2019-03-27 DIAGNOSIS — Z87891 Personal history of nicotine dependence: Secondary | ICD-10-CM

## 2019-03-27 DIAGNOSIS — T8612 Kidney transplant failure: Secondary | ICD-10-CM | POA: Diagnosis present

## 2019-03-27 DIAGNOSIS — Z885 Allergy status to narcotic agent status: Secondary | ICD-10-CM

## 2019-03-27 DIAGNOSIS — E860 Dehydration: Secondary | ICD-10-CM | POA: Diagnosis present

## 2019-03-27 DIAGNOSIS — Z940 Kidney transplant status: Secondary | ICD-10-CM | POA: Diagnosis not present

## 2019-03-27 DIAGNOSIS — B957 Other staphylococcus as the cause of diseases classified elsewhere: Secondary | ICD-10-CM | POA: Diagnosis present

## 2019-03-27 DIAGNOSIS — K659 Peritonitis, unspecified: Secondary | ICD-10-CM | POA: Diagnosis present

## 2019-03-27 DIAGNOSIS — Z794 Long term (current) use of insulin: Secondary | ICD-10-CM

## 2019-03-27 LAB — CBC WITH DIFFERENTIAL/PLATELET
Abs Immature Granulocytes: 0 10*3/uL (ref 0.00–0.07)
Basophils Absolute: 0 10*3/uL (ref 0.0–0.1)
Basophils Relative: 0 %
Eosinophils Absolute: 0 10*3/uL (ref 0.0–0.5)
Eosinophils Relative: 0 %
HCT: 24.8 % — ABNORMAL LOW (ref 39.0–52.0)
Hemoglobin: 8.5 g/dL — ABNORMAL LOW (ref 13.0–17.0)
Lymphocytes Relative: 24 %
Lymphs Abs: 1.7 10*3/uL (ref 0.7–4.0)
MCH: 38.6 pg — ABNORMAL HIGH (ref 26.0–34.0)
MCHC: 34.3 g/dL (ref 30.0–36.0)
MCV: 112.7 fL — ABNORMAL HIGH (ref 80.0–100.0)
Monocytes Absolute: 0.7 10*3/uL (ref 0.1–1.0)
Monocytes Relative: 10 %
Neutro Abs: 4.7 10*3/uL (ref 1.7–7.7)
Neutrophils Relative %: 66 %
Platelets: 117 10*3/uL — ABNORMAL LOW (ref 150–400)
RBC: 2.2 MIL/uL — ABNORMAL LOW (ref 4.22–5.81)
RDW: 13.7 % (ref 11.5–15.5)
WBC: 7.1 10*3/uL (ref 4.0–10.5)
nRBC: 0 % (ref 0.0–0.2)
nRBC: 0 /100 WBC

## 2019-03-27 LAB — COMPREHENSIVE METABOLIC PANEL
ALT: 27 U/L (ref 0–44)
AST: 23 U/L (ref 15–41)
Albumin: 2.9 g/dL — ABNORMAL LOW (ref 3.5–5.0)
Alkaline Phosphatase: 73 U/L (ref 38–126)
Anion gap: 19 — ABNORMAL HIGH (ref 5–15)
BUN: 34 mg/dL — ABNORMAL HIGH (ref 6–20)
CO2: 26 mmol/L (ref 22–32)
Calcium: 9.2 mg/dL (ref 8.9–10.3)
Chloride: 92 mmol/L — ABNORMAL LOW (ref 98–111)
Creatinine, Ser: 20.3 mg/dL — ABNORMAL HIGH (ref 0.61–1.24)
GFR calc Af Amer: 3 mL/min — ABNORMAL LOW (ref >60)
GFR calc non Af Amer: 2 mL/min — ABNORMAL LOW (ref >60)
Glucose, Bld: 102 mg/dL — ABNORMAL HIGH (ref 70–99)
Potassium: 4.2 mmol/L (ref 3.5–5.1)
Sodium: 137 mmol/L (ref 135–145)
Total Bilirubin: 0.9 mg/dL (ref 0.3–1.2)
Total Protein: 7.1 g/dL (ref 6.5–8.1)

## 2019-03-27 LAB — C DIFFICILE QUICK SCREEN W PCR REFLEX
C Diff antigen: NEGATIVE
C Diff interpretation: NOT DETECTED
C Diff toxin: NEGATIVE

## 2019-03-27 LAB — CBG MONITORING, ED: Glucose-Capillary: 84 mg/dL (ref 70–99)

## 2019-03-27 LAB — LACTIC ACID, PLASMA: Lactic Acid, Venous: 2 mmol/L (ref 0.5–1.9)

## 2019-03-27 MED ORDER — FERRIC CITRATE 1 GM 210 MG(FE) PO TABS
210.0000 mg | ORAL_TABLET | Freq: Every day | ORAL | Status: DC
  Administered 2019-03-28 – 2019-03-31 (×4): 210 mg via ORAL
  Filled 2019-03-27 (×4): qty 1

## 2019-03-27 MED ORDER — LATANOPROST 0.005 % OP SOLN
1.0000 [drp] | Freq: Every day | OPHTHALMIC | Status: DC
  Administered 2019-03-28 – 2019-03-29 (×2): 1 [drp] via OPHTHALMIC
  Filled 2019-03-27: qty 2.5

## 2019-03-27 MED ORDER — CALCITRIOL 0.5 MCG PO CAPS
0.7500 ug | ORAL_CAPSULE | Freq: Every day | ORAL | Status: DC
  Administered 2019-03-28 – 2019-04-01 (×5): 0.75 ug via ORAL
  Filled 2019-03-27 (×5): qty 1

## 2019-03-27 MED ORDER — TACROLIMUS 0.5 MG PO CAPS
0.5000 mg | ORAL_CAPSULE | Freq: Every day | ORAL | Status: DC
  Administered 2019-03-28 – 2019-04-01 (×6): 0.5 mg via ORAL
  Filled 2019-03-27 (×6): qty 1

## 2019-03-27 MED ORDER — SODIUM BICARBONATE 650 MG PO TABS: 1300.0000 mg | ORAL_TABLET | Freq: Three times a day (TID) | ORAL | Status: DC

## 2019-03-27 MED ORDER — PREDNISONE 5 MG PO TABS
5.0000 mg | ORAL_TABLET | Freq: Every day | ORAL | Status: DC
  Administered 2019-03-28 – 2019-04-01 (×5): 5 mg via ORAL
  Filled 2019-03-27 (×5): qty 1

## 2019-03-27 MED ORDER — PROMETHAZINE HCL 25 MG/ML IJ SOLN
25.0000 mg | Freq: Once | INTRAMUSCULAR | Status: AC
  Administered 2019-03-27: 25 mg via INTRAVENOUS
  Filled 2019-03-27: qty 1

## 2019-03-27 MED ORDER — SODIUM CHLORIDE 0.9 % IV BOLUS
1000.0000 mL | Freq: Once | INTRAVENOUS | Status: AC
  Administered 2019-03-27: 1000 mL via INTRAVENOUS

## 2019-03-27 MED ORDER — PANTOPRAZOLE SODIUM 20 MG PO TBEC
20.0000 mg | DELAYED_RELEASE_TABLET | Freq: Every day | ORAL | Status: DC
  Administered 2019-03-28 – 2019-04-01 (×6): 20 mg via ORAL
  Filled 2019-03-27 (×6): qty 1

## 2019-03-27 MED ORDER — VALGANCICLOVIR HCL 450 MG PO TABS
450.0000 mg | ORAL_TABLET | ORAL | Status: DC
  Administered 2019-03-30 – 2019-04-01 (×2): 450 mg via ORAL
  Filled 2019-03-27 (×2): qty 1

## 2019-03-27 MED ORDER — POTASSIUM CHLORIDE CRYS ER 20 MEQ PO TBCR
20.0000 meq | EXTENDED_RELEASE_TABLET | ORAL | Status: DC
  Administered 2019-03-28: 20 meq via ORAL
  Filled 2019-03-27: qty 1

## 2019-03-27 NOTE — H&P (Addendum)
Date: 03/28/2019               Patient Name:  James [name] MRN: [id number]  DOB: 1970-10-01 Age / Sex: 48 y.o., male   PCP: Patient, No Pcp Per         Medical Service: Internal Medicine Teaching Service         Attending Physician: Dr. Evette Doffing, Mallie Mussel, *    First Contact: Dr. Jeanmarie Hubert Pager:  [phone]  Second Contact: Dr. 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 He presents today with a one-week history of hypotension. He was recently discharged from the hospital on 03/17/2019 for peritonitis and treated with vancomycin and cefepime. Since being discharged, he has had progressive episodes of hypotension with his "systolic in the 22'W". He has associated dizziness particularly when standing and feeling like he is going to pass out. He admits to a 3 week history of non-bloody diarrhea that began prior to his last hospitalization. He characterizes the diarrhea has liquidy and dark green and has roughly 3 to 6 BM each day. He has tried Imodium without success. He also has a 1 week history of nausea and non-bloody emesis for which he was prescribed phenergan. He denies sick contacts or eating irregular foods.  Although he does admit to mild periumbilical abdominal pain, he states that this does not feel like the same as his past episodes of peritonitis.  Patient expresses having a difficult time over the last couple months. He states that his wife passed away in 10/16/22 due to COVID-19 and he was subsequently  diagnosed with prostate cancer shortly there after.  His prostatectomy was put on hold due to recent peritonitis.  Patient states that he has a follow-up appointment with his urologist to reschedule his surgery.  He denies fever, shortness of breath, chest pain. He checks his temperature before and after dialysis. He averages 3 to 4 pounds of fluid removed after dialysis each day. He has had some pain with first exchange, does five exchanges total. The last time the patient did his dialysis was Thursday evening. He states the dialysate was clear.    Social:  - Quit smoking after last discharge one month ago. Prev almost 1ppd.  - Wife passed away in October 16, 2022, he's not doing as well without her but is getting by. Has felt depressed   Family History:  Family History  Adopted: Yes     Meds:  Current Meds  Medication Sig  . acetaminophen (TYLENOL) 500 MG tablet Take 500-1,000 mg by mouth every 6 (six) hours as needed (pain or headaches).   . calcitRIOL (ROCALTROL) 0.25 MCG capsule Take 0.75 mcg by mouth daily.  . diphenhydrAMINE (BENADRYL) 25 mg capsule Take 25 mg by mouth every 6 (six) hours as needed for itching.  . ferric citrate (AURYXIA) 1 GM 210 MG(Fe) tablet Take 210-420 mg by mouth See admin instructions. Take 210-420 mg by mouth one to three times a day with meals and 210 mg with each snack  . ferrous sulfate 325 (65 FE) MG tablet  Take 325 mg by mouth 2 (two) times daily with a meal.  . insulin aspart (NOVOLOG FLEXPEN) 100 UNIT/ML FlexPen Inject 10 Units into the skin 3 (three) times daily with meals. (Patient taking differently: Inject 2-10 Units into the skin See admin instructions. Inject 2-10 units into the skin three times a day with meals, per sliding scale)  . Insulin Detemir (LEVEMIR FLEXTOUCH) 100 UNIT/ML Pen Inject 15 Units into the skin at bedtime. (Patient taking differently: Inject 15 Units into the skin every other day. )  . latanoprost (XALATAN) 0.005 % ophthalmic solution  Place 1 drop into both eyes at bedtime.  Marland Kitchen loperamide (IMODIUM) 2 MG capsule Take 2 mg by mouth as needed for diarrhea or loose stools.   . Melatonin 5 MG CAPS Take 5 mg by mouth at bedtime.   . Multiple Vitamins-Minerals (CENTRUM MEN) TABS Take 1 tablet by mouth 2 (two) times daily.  Marland Kitchen omeprazole (PRILOSEC) 20 MG capsule Take 20 mg by mouth daily.  . potassium chloride SA (K-DUR) 20 MEQ tablet Take 20 mEq by mouth every Monday, Wednesday, and Friday.  . predniSONE (DELTASONE) 5 MG tablet Take 5 mg by mouth daily with breakfast.  . promethazine (PHENERGAN) 25 MG tablet Take 25 mg by mouth every 6 (six) hours as needed for nausea/vomiting.  . sodium bicarbonate 650 MG tablet Take 1,300 mg by mouth 3 (three) times daily.   . tacrolimus (PROGRAF) 0.5 MG capsule Take 0.5 mg by mouth daily.   . valGANciclovir (VALCYTE) 450 MG tablet Take 450 mg by mouth See admin instructions. Take one tablet (450 mg) by mouth on Monday, Wednesday, Friday before dialysis  . zolpidem (AMBIEN) 10 MG tablet Take 10 mg by mouth at bedtime as needed for sleep.      Allergies: Allergies as of 03/27/2019 - Review Complete 03/27/2019  Allergen Reaction Noted  . Tramadol hcl Shortness Of Breath and Other (See Comments) 08/05/2009  . Nsaids Other (See Comments) 03/27/2019  . Darvon [propoxyphene] Rash and Other (See Comments) 04/09/2014  . Tramadol Rash and Other (See Comments) 04/09/2014   Past Medical History:  Diagnosis Date  . Anemia   . Diabetes mellitus without complication ([place])   . Hypertension   . Influenza A   . Pneumonia 04/21/2017  . Renal disorder   . 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 He then got HD in [facility] until getting a renal transplant in 2009 at [place], [place].  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    . Smoker   . Tachycardia   . Transplant recipient      Review of Systems: A complete ROS was negative except as per HPI.   Physical Exam: Blood pressure (!) 154/99, pulse (!) 110, temperature 98.1 F (36.7 C), temperature source Oral, resp. rate 16, height 5\' 6"  (1.676 m), weight 75.3 kg, SpO2 100 %. Physical Exam  Constitutional: He is oriented to person, place, and time. No distress.  Eyes: EOM are normal.  Neck: No tracheal deviation present. No thyromegaly present.  Cardiovascular: Normal pulses. Tachycardia present. Exam reveals no gallop.  No murmur heard. Pulmonary/Chest: Effort normal and breath sounds normal. No  respiratory distress. He exhibits no tenderness.  Abdominal: Soft. Bowel sounds are normal. He exhibits no distension. There is abdominal tenderness. There is rebound. There is no guarding.  Musculoskeletal: Normal range of motion.        General: No edema.  Neurological: He is alert and oriented to person, place, and time.  Skin: Skin is warm and dry. He is not diaphoretic.  Psychiatric: He exhibits a depressed mood.     EKG: personally reviewed my interpretation is Sinus tachycardia. Probable left atrial enlargement. Probable left ventricular hypertrophy. Borderline prolonged QT interval no significant change since Aug 22  CXR: personally reviewed my interpretation is a normal limits  Assessment & Plan by Problem: Principal Problem:   Hypotension Active Problems:   Renal transplant recipient / ESRD   Diabetes mellitus without complication ([place])   Diarrhea   Peritonitis associated with peritoneal dialysis ([place])    Plan:  Hypotension - he received 1.0 L of normal saline in the ED.  We gave another 1.0 on the floor. Total of 2.0 L.  Patient's hypotension is likely secondary to his diarrhea and volume loss via peritoneal dialysis.  Patient's volume loss is likely contributing to  his lactic acidosis.  Although patient continues to have vague periumbilical abdominal pain this is unlikely to be peritonitis, as he just finished cefepime and vancomycin yesterday.  - Orthostatics were negative.  -We will likely order midodrine  - Will likely need nephrology consult.  - Body fluid cell count with differential from peritoneal fluid  Diarrhea - uncertain etiology possibly secondary to cefepime or tacrolimus, but unlikely.  Patient is taking valganciclovir for EBV prophylaxis for 50 mg 3 times daily.  This dose is subtherapeutic for CMV prophylaxis and therefore CMV colitis cannot be ruled out. Possible infectious gastroenteritis. - C. difficile negative - CMV DNA PCR - GI biofire pcr panel   ESRD -  patient is peritoneal dialysis.  Has not had dialysis since Thursday night.  Patient has elevated lactic acid which could be secondary to uremia. Although patient still has mild abdominal pain, this is unlikely to be peritonitis due to recently finishing his antibiotic therapy yesterday. - EBV prophylaxis with valganciclovir - Strict ins and out -Continued prednisone, sodium bicarb, tacrolimus, and valganciclovir.   Diet: Renal carb diet VTE: SCDs IVF: 0.9% sodium chloride Code: Full code  Dispo: Admit patient to Inpatient with expected length of stay greater than 2 midnights.  Signed: Marianna Payment, MD 03/28/2019, 4:08 AM  Pager: [phone]

## 2019-03-27 NOTE — ED Provider Notes (Signed)
[facility] EMERGENCY DEPARTMENT Provider Note   CSN: [id number] Arrival date & time: 03/27/19  1535     History   Chief Complaint Chief Complaint  Patient presents with  . hypotensive after dialysis    HPI James [name] is a 48 y.o. male.     HPI  48 year old male presents with hypotension.  He took his blood pressure after doing dialysis at home and his blood pressure was 87 systolic.  Feels dizzy when he stands.  He states his blood pressure has been running low over the last 1 week or so.  Typically in the 90s.  He was taken off all of his hypertension medicine.  He denies any fever.  He has been having vomiting, often multiple times a day after eating.  This has been ongoing for about 2 weeks.  Has had green diarrhea multiple times a day for the last 3 weeks or so.  He was recently admitted and treated for peritonitis with vancomycin and cefepime though he states the diarrhea started first.  He feels okay currently laying in the stretcher.  Some on and off abdominal discomfort the mostly feels "weird". He is currently nauseated. His dialysate has been clear.  Past Medical History:  Diagnosis Date  . Anemia   . Diabetes mellitus without complication ([place])   . Hypertension   . Influenza A   . Pneumonia 04/21/2017  . Renal disorder   . 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 He then got HD in [facility] until getting a renal transplant in 2009 at [place], [place].  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    . Smoker   . Tachycardia   . Transplant recipient     Patient Active Problem List   Diagnosis Date Noted  . Peritonitis associated with peritoneal dialysis ([place]) 03/14/2019  . Fever 09/21/2017  .  Influenza A 09/21/2017  . Anemia   . Community acquired pneumonia of right upper lobe of lung ([place])   . Transaminitis   . Hypokalemia   . HCAP (healthcare-associated pneumonia) 04/19/2017  . Sepsis ([place])   . Sepsis due to Gram-negative organism with acute renal failure ([place]) 08/14/2015  . Acute on chronic renal failure ([place])   . Nausea vomiting and diarrhea   . Renal transplant disorder   . Diarrhea 08/13/2015  . AKI (acute kidney injury) ([place]) 08/13/2015  . Urinary tract infection 04/10/2014  . UTI (urinary tract infection) 04/09/2014  . Tachycardia 04/09/2014  . Renal transplant recipient / ESRD   . Diabetes mellitus without complication ([place])   . Hypertension   . Smoker     Past Surgical History:  Procedure Laterality Date  . CHOLECYSTECTOMY    . HIP SURGERY    . INSERTION OF DIALYSIS CATHETER Left 04/24/2017   Procedure: EXCHANGE  OF DIALYSIS CATHETER;  Surgeon: Rosetta Posner, MD;  Location: [facility];  Service: Vascular;  Laterality: Left;  . KIDNEY TRANSPLANT    . PERITONEAL CATHETER INSERTION Left 08/2017        Home Medications    Prior to Admission medications   Medication Sig Start Date End Date Taking? Authorizing Provider  promethazine (PHENERGAN) 25 MG tablet Take 25 mg by mouth every 6 (six) hours as needed for nausea/vomiting. 03/23/19 03/30/19 Yes [provider]  acetaminophen (TYLENOL) 500 MG tablet Take 500-1,000 mg by mouth every 6 (six) hours as needed for headache (pain).    [provider]  amLODipine (NORVASC) 5 MG tablet Take 1 tablet (5 mg total) by mouth daily. Patient taking differently: Take 5 mg by mouth 2 (two) times daily.  08/16/15   Liberty Handy, MD  calcitRIOL (ROCALTROL) 0.25 MCG capsule Take 0.75 mcg by mouth daily.    [provider]  diphenhydrAMINE (BENADRYL) 25 mg capsule Take 25 mg by mouth every 6 (six) hours as needed for itching.    [provider]  doxazosin (CARDURA) 4 MG tablet Take 4 mg by mouth  at bedtime. 12/25/18   [provider]  ferric citrate (AURYXIA) 1 GM 210 MG(Fe) tablet Take 210 mg by mouth daily with supper.    [provider]  ferrous sulfate 325 (65 FE) MG tablet Take 325 mg by mouth 2 (two) times daily with a meal.    [provider]  insulin aspart (NOVOLOG FLEXPEN) 100 UNIT/ML FlexPen Inject 10 Units into the skin 3 (three) times daily with meals. Patient taking differently: Inject 15 Units into the skin 3 (three) times daily with meals.  04/25/17   Mariel Aloe, MD  Insulin Detemir (LEVEMIR FLEXTOUCH) 100 UNIT/ML Pen Inject 15 Units into the skin at bedtime. Patient taking differently: Inject 15 Units into the skin every other day.  09/23/17   Regalado, Belkys A, MD  latanoprost (XALATAN) 0.005 % ophthalmic solution Place 1 drop into both eyes at bedtime.    [provider]  lisinopril (ZESTRIL) 10 MG tablet Take 10 mg by mouth daily.    [provider]  loperamide (IMODIUM) 2 MG capsule Take 2 mg by mouth daily as needed for diarrhea or loose stools.    [provider]  Melatonin 5 MG CAPS Take 5 mg by mouth at bedtime as needed (sleep).     [provider]  metoprolol tartrate (LOPRESSOR) 100 MG tablet Take 100 mg by mouth 2 (two) times daily.     [provider]  Multiple Vitamins-Minerals (MULTIVITAMIN WITH MINERALS) tablet Take 1 tablet by mouth 2 (two) times daily. Centrum    [provider]  omeprazole (PRILOSEC) 20 MG capsule Take 20 mg by mouth daily.    [provider]  potassium chloride SA (K-DUR) 20 MEQ tablet Take 20 mEq by mouth every Monday, Wednesday, and Friday. 10/15/18   [provider]  predniSONE (DELTASONE) 5 MG tablet Take 5 mg by mouth daily with breakfast.    [provider]  sodium bicarbonate 650 MG tablet Take 1,300 mg by mouth 3 (three) times daily.     [provider]  tacrolimus (PROGRAF) 0.5 MG capsule Take 0.5 mg by mouth  daily.     [provider]  valGANciclovir (VALCYTE) 450 MG tablet Take 450 mg by mouth See admin instructions. Take one tablet (450 mg) by mouth on Monday, Wednesday, Friday before dialysis 08/22/17   [provider]  zolpidem (AMBIEN) 10 MG tablet Take 10 mg by mouth at bedtime as needed for sleep.     [provider]    Family History Family History  Adopted: Yes    Social History Social History   Tobacco Use  . Smoking status: Former Smoker    Packs/day: 0.20  .  Smokeless tobacco: Never Used  Substance Use Topics  . Alcohol use: No  . Drug use: No     Allergies   Tramadol hcl, Darvon [propoxyphene], and Tramadol   Review of Systems Review of Systems  Constitutional: Negative for fever.  Respiratory: Negative for cough and shortness of breath.   Cardiovascular: Negative for chest pain.  Gastrointestinal: Positive for abdominal pain, diarrhea and vomiting.  Genitourinary: Negative for dysuria.       Minimal urine output  Musculoskeletal: Negative for back pain.  Neurological: Positive for light-headedness.  All other systems reviewed and are negative.    Physical Exam Updated Vital Signs BP 105/76   Pulse (!) 125   Temp 98.1 F (36.7 C) (Rectal)   Resp (!) 21   SpO2 99%   Physical Exam Vitals signs and nursing note reviewed.  Constitutional:      General: He is not in acute distress.    Appearance: He is well-developed. He is not ill-appearing or diaphoretic.  HENT:     Head: Normocephalic and atraumatic.     Right Ear: External ear normal.     Left Ear: External ear normal.     Nose: Nose normal.  Eyes:     General:        Right eye: No discharge.        Left eye: No discharge.  Neck:     Musculoskeletal: Neck supple.  Cardiovascular:     Rate and Rhythm: Regular rhythm. Tachycardia present.     Heart sounds: Normal heart sounds.  Pulmonary:     Effort: Pulmonary effort is normal.     Breath sounds: Normal breath  sounds.  Abdominal:     Palpations: Abdomen is soft.     Tenderness: There is no abdominal tenderness.  Skin:    General: Skin is warm and dry.     Findings: No erythema.  Neurological:     Mental Status: He is alert.  Psychiatric:        Mood and Affect: Mood is not anxious.      ED Treatments / Results  Labs (all labs ordered are listed, but only abnormal results are displayed) Labs Reviewed  COMPREHENSIVE METABOLIC PANEL - Abnormal; Notable for the following components:      Result Value   Chloride 92 (*)    Glucose, Bld 102 (*)    BUN 34 (*)    Creatinine, Ser 20.30 (*)    Albumin 2.9 (*)    GFR calc non Af Amer 2 (*)    GFR calc Af Amer 3 (*)    Anion gap 19 (*)    All other components within normal limits  CBC WITH DIFFERENTIAL/PLATELET - Abnormal; Notable for the following components:   RBC 2.20 (*)    Hemoglobin 8.5 (*)    HCT 24.8 (*)    MCV 112.7 (*)    MCH 38.6 (*)    Platelets 117 (*)    All other components within normal limits  LACTIC ACID, PLASMA - Abnormal; Notable for the following components:   Lactic Acid, Venous 2.0 (*)    All other components within normal limits  CULTURE, BLOOD (ROUTINE X 2)  CULTURE, BLOOD (ROUTINE X 2)  URINE CULTURE  GASTROINTESTINAL PANEL BY PCR, STOOL (REPLACES STOOL CULTURE)  C DIFFICILE QUICK SCREEN W PCR REFLEX  SARS CORONAVIRUS 2 (TAT 6-24 HRS)  BODY FLUID CULTURE  GRAM STAIN  URINALYSIS, ROUTINE W REFLEX MICROSCOPIC  LACTIC ACID, PLASMA  BODY  FLUID CELL COUNT WITH DIFFERENTIAL  CBG MONITORING, ED    EKG EKG Interpretation  Date/Time:  Friday March 27 2019 15:35:49 EDT Ventricular Rate:  114 PR Interval:    QRS Duration: 89 QT Interval:  346 QTC Calculation: 477 R Axis:   13 Text Interpretation:  Sinus tachycardia Probable left atrial enlargement Probable left ventricular hypertrophy Borderline prolonged QT interval no significant change since Mar 14 2019 Confirmed by Sherwood Gambler [phone]) on  03/27/2019 4:02:30 PM   Radiology Dg Chest Portable 1 View  Result Date: 03/27/2019 CLINICAL DATA:  Pt arrives from home c/o hypotension after peritoneal dialysis today. Also episodes of diarrhea. EXAM: PORTABLE CHEST 1 VIEW COMPARISON:  03/14/2019 and multiple prior studies including 04/19/2017 FINDINGS: The heart size is normal. Slight prominence of the AP window region shows long-term stability. No focal consolidations or pleural effusions. No pulmonary edema. IMPRESSION: No active disease. Electronically Signed   By: Nolon Nations M.D.   On: 03/27/2019 16:44    Procedures Procedures (including critical care time)  Medications Ordered in ED Medications  sodium chloride 0.9 % bolus 1,000 mL (has no administration in time range)  sodium chloride 0.9 % bolus 1,000 mL (1,000 mLs Intravenous New Bag/Given 03/27/19 1710)  promethazine (PHENERGAN) injection 25 mg (25 mg Intravenous Given 03/27/19 1712)     Initial Impression / Assessment and Plan / ED Course  I have reviewed the triage vital signs and the nursing notes.  Pertinent labs & imaging results that were available during my care of the patient were reviewed by me and considered in my medical decision making (see chart for details).        Unclear why the patient is having low blood pressure.  Most likely is from his continued diarrhea and overall dehydration with some vomiting.  His WBC is normal.  Creatinine is always high.  However given his symptoms, dehydration is probably playing the most prominent role.  He is not febrile.  He reports his dialysate is clear though we should still tested.  Otherwise there is no obvious infection.  My suspicion is the lactate is from dehydration rather than infection but cultures have been sent.  I have consulted the internal medicine teaching service for admission given continued symptoms and tachycardia after fluids.   Fares Ramthun Ospina was evaluated in Emergency Department on 03/27/2019 for the  symptoms described in the history of present illness. He was evaluated in the context of the global COVID-19 pandemic, which necessitated consideration that the patient might be at risk for infection with the SARS-CoV-2 virus that causes COVID-19. Institutional protocols and algorithms that pertain to the evaluation of patients at risk for COVID-19 are in a state of rapid change based on information released by regulatory bodies including the CDC and federal and state organizations. These policies and algorithms were followed during the patient's care in the ED.   Final Clinical Impressions(s) / ED Diagnoses   Final diagnoses:  Hypotension, unspecified hypotension type    ED Discharge Orders    None       Sherwood Gambler, MD 03/27/19 [phone]

## 2019-03-27 NOTE — ED Notes (Signed)
Unsuccessful blood draw RN Almyra Free informed

## 2019-03-27 NOTE — ED Triage Notes (Signed)
Pt arrives with [facility] from home c/o hypotension after peritoneal dialysis. Per EMS, pt was taken off of BP meds ~1 week now due to decreased blood pressures from dialysis. BP with EMS was 100/60; 300 mL NS bolus given.

## 2019-03-27 NOTE — ED Notes (Signed)
Admitting at bedside 

## 2019-03-28 ENCOUNTER — Other Ambulatory Visit: Payer: Self-pay

## 2019-03-28 DIAGNOSIS — Z886 Allergy status to analgesic agent status: Secondary | ICD-10-CM

## 2019-03-28 DIAGNOSIS — E1122 Type 2 diabetes mellitus with diabetic chronic kidney disease: Secondary | ICD-10-CM

## 2019-03-28 DIAGNOSIS — Z885 Allergy status to narcotic agent status: Secondary | ICD-10-CM

## 2019-03-28 DIAGNOSIS — D47Z9 Other specified neoplasms of uncertain behavior of lymphoid, hematopoietic and related tissue: Secondary | ICD-10-CM

## 2019-03-28 DIAGNOSIS — Z940 Kidney transplant status: Secondary | ICD-10-CM

## 2019-03-28 DIAGNOSIS — N186 End stage renal disease: Secondary | ICD-10-CM

## 2019-03-28 DIAGNOSIS — E861 Hypovolemia: Secondary | ICD-10-CM

## 2019-03-28 DIAGNOSIS — I959 Hypotension, unspecified: Secondary | ICD-10-CM

## 2019-03-28 DIAGNOSIS — R197 Diarrhea, unspecified: Secondary | ICD-10-CM

## 2019-03-28 DIAGNOSIS — D649 Anemia, unspecified: Secondary | ICD-10-CM

## 2019-03-28 DIAGNOSIS — Z79899 Other long term (current) drug therapy: Secondary | ICD-10-CM

## 2019-03-28 DIAGNOSIS — Z992 Dependence on renal dialysis: Secondary | ICD-10-CM

## 2019-03-28 DIAGNOSIS — Z7952 Long term (current) use of systemic steroids: Secondary | ICD-10-CM

## 2019-03-28 DIAGNOSIS — I120 Hypertensive chronic kidney disease with stage 5 chronic kidney disease or end stage renal disease: Secondary | ICD-10-CM

## 2019-03-28 DIAGNOSIS — C61 Malignant neoplasm of prostate: Secondary | ICD-10-CM

## 2019-03-28 LAB — TSH: TSH: 0.902 u[IU]/mL (ref 0.350–4.500)

## 2019-03-28 LAB — GASTROINTESTINAL PANEL BY PCR, STOOL (REPLACES STOOL CULTURE)

## 2019-03-28 LAB — GRAM STAIN

## 2019-03-28 LAB — URINALYSIS, ROUTINE W REFLEX MICROSCOPIC
Bilirubin Urine: NEGATIVE
Glucose, UA: NEGATIVE mg/dL
Hgb urine dipstick: NEGATIVE
Ketones, ur: NEGATIVE mg/dL
Nitrite: NEGATIVE
Protein, ur: >=300 mg/dL — AB
Specific Gravity, Urine: 1.016 (ref 1.005–1.030)
pH: 6 (ref 5.0–8.0)

## 2019-03-28 LAB — COMPREHENSIVE METABOLIC PANEL
ALT: 23 U/L (ref 0–44)
AST: 23 U/L (ref 15–41)
Albumin: 2.5 g/dL — ABNORMAL LOW (ref 3.5–5.0)
Alkaline Phosphatase: 52 U/L (ref 38–126)
Anion gap: 21 — ABNORMAL HIGH (ref 5–15)
BUN: 37 mg/dL — ABNORMAL HIGH (ref 6–20)
CO2: 17 mmol/L — ABNORMAL LOW (ref 22–32)
Calcium: 8.5 mg/dL — ABNORMAL LOW (ref 8.9–10.3)
Chloride: 100 mmol/L (ref 98–111)
Creatinine, Ser: 21.98 mg/dL — ABNORMAL HIGH (ref 0.61–1.24)
GFR calc Af Amer: 2 mL/min — ABNORMAL LOW (ref >60)
GFR calc non Af Amer: 2 mL/min — ABNORMAL LOW (ref >60)
Glucose, Bld: 72 mg/dL (ref 70–99)
Potassium: 5.1 mmol/L (ref 3.5–5.1)
Sodium: 138 mmol/L (ref 135–145)
Total Bilirubin: 1 mg/dL (ref 0.3–1.2)
Total Protein: 6 g/dL — ABNORMAL LOW (ref 6.5–8.1)

## 2019-03-28 LAB — LACTIC ACID, PLASMA
Lactic Acid, Venous: 2.1 mmol/L (ref 0.5–1.9)
Lactic Acid, Venous: 1.7 mmol/L (ref 0.5–1.9)

## 2019-03-28 LAB — BASIC METABOLIC PANEL
Anion gap: 18 — ABNORMAL HIGH (ref 5–15)
BUN: 37 mg/dL — ABNORMAL HIGH (ref 6–20)
CO2: 23 mmol/L (ref 22–32)
Calcium: 8.6 mg/dL — ABNORMAL LOW (ref 8.9–10.3)
Chloride: 97 mmol/L — ABNORMAL LOW (ref 98–111)
Creatinine, Ser: 22.49 mg/dL — ABNORMAL HIGH (ref 0.61–1.24)
GFR calc Af Amer: 2 mL/min — ABNORMAL LOW (ref >60)
GFR calc non Af Amer: 2 mL/min — ABNORMAL LOW (ref >60)
Glucose, Bld: 76 mg/dL (ref 70–99)
Potassium: 5.8 mmol/L — ABNORMAL HIGH (ref 3.5–5.1)
Sodium: 138 mmol/L (ref 135–145)

## 2019-03-28 LAB — BODY FLUID CELL COUNT WITH DIFFERENTIAL
Eos, Fluid: 2 %
Lymphs, Fluid: 5 %
Monocyte-Macrophage-Serous Fluid: 7 % — ABNORMAL LOW (ref 50–90)
Neutrophil Count, Fluid: 86 % — ABNORMAL HIGH (ref 0–25)
Total Nucleated Cell Count, Fluid: 49 cu mm (ref 0–1000)

## 2019-03-28 LAB — CBC
HCT: 20.3 % — ABNORMAL LOW (ref 39.0–52.0)
Hemoglobin: 7 g/dL — ABNORMAL LOW (ref 13.0–17.0)
MCH: 38.7 pg — ABNORMAL HIGH (ref 26.0–34.0)
MCHC: 34.5 g/dL (ref 30.0–36.0)
MCV: 112.2 fL — ABNORMAL HIGH (ref 80.0–100.0)
Platelets: 102 10*3/uL — ABNORMAL LOW (ref 150–400)
RBC: 1.81 MIL/uL — ABNORMAL LOW (ref 4.22–5.81)
RDW: 14 % (ref 11.5–15.5)
WBC: 5.2 10*3/uL (ref 4.0–10.5)
nRBC: 0 % (ref 0.0–0.2)

## 2019-03-28 LAB — SARS CORONAVIRUS 2 (TAT 6-24 HRS): SARS Coronavirus 2: NEGATIVE

## 2019-03-28 LAB — MAGNESIUM: Magnesium: 1.6 mg/dL — ABNORMAL LOW (ref 1.7–2.4)

## 2019-03-28 LAB — PHOSPHORUS: Phosphorus: 6.3 mg/dL — ABNORMAL HIGH (ref 2.5–4.6)

## 2019-03-28 MED ORDER — GENTAMICIN SULFATE 0.1 % EX CREA
1.0000 "application " | TOPICAL_CREAM | Freq: Every day | CUTANEOUS | Status: DC
  Administered 2019-03-30 – 2019-03-31 (×4): 1 via TOPICAL
  Filled 2019-03-28: qty 15

## 2019-03-28 MED ORDER — MIDODRINE HCL 5 MG PO TABS
5.0000 mg | ORAL_TABLET | Freq: Once | ORAL | Status: AC
  Administered 2019-03-28: 5 mg via ORAL
  Filled 2019-03-28: qty 1

## 2019-03-28 MED ORDER — SODIUM BICARBONATE 650 MG PO TABS
1300.0000 mg | ORAL_TABLET | Freq: Three times a day (TID) | ORAL | Status: DC
  Administered 2019-03-28 – 2019-04-01 (×14): 1300 mg via ORAL
  Filled 2019-03-28 (×14): qty 2

## 2019-03-28 MED ORDER — PROMETHAZINE HCL 25 MG/ML IJ SOLN
25.0000 mg | Freq: Four times a day (QID) | INTRAMUSCULAR | Status: DC | PRN
  Administered 2019-03-28: 25 mg via INTRAVENOUS
  Filled 2019-03-28: qty 1

## 2019-03-28 MED ORDER — DARBEPOETIN ALFA 100 MCG/0.5ML IJ SOSY
100.0000 ug | PREFILLED_SYRINGE | INTRAMUSCULAR | Status: DC
  Administered 2019-03-29: 100 ug via SUBCUTANEOUS
  Filled 2019-03-28: qty 0.5

## 2019-03-28 MED ORDER — DELFLEX-LC/2.5% DEXTROSE 394 MOSM/L IP SOLN: INTRAPERITONEAL | Status: DC

## 2019-03-28 MED ORDER — SODIUM CHLORIDE 0.9 % IV BOLUS
500.0000 mL | Freq: Once | INTRAVENOUS | Status: AC
  Administered 2019-03-28: 500 mL via INTRAVENOUS

## 2019-03-28 MED ORDER — MIDODRINE HCL 5 MG PO TABS: 2.5000 mg | ORAL_TABLET | Freq: Three times a day (TID) | ORAL | Status: DC

## 2019-03-28 MED ORDER — SODIUM ZIRCONIUM CYCLOSILICATE 10 G PO PACK
10.0000 g | PACK | Freq: Every day | ORAL | Status: DC
  Administered 2019-03-28 – 2019-03-29 (×2): 10 g via ORAL
  Filled 2019-03-28 (×3): qty 1

## 2019-03-28 MED ORDER — DELFLEX-LC/1.5% DEXTROSE 344 MOSM/L IP SOLN: INTRAPERITONEAL | Status: DC

## 2019-03-28 NOTE — ED Notes (Signed)
42M Nurse notified about hemolyzed blood draw for PT/PTT.

## 2019-03-28 NOTE — Progress Notes (Signed)
Subjective: The patient had much to share this morning regarding his history and the progression of his renal disease.  He reiterates that his diarrhea has been most noticeable and/or problematic for the past 3 weeks and proceeded to treatment with his peritoneal to antibiotic therapy.  He notes a one-week history of nausea with associated vomiting secondary to large volume dwell placement to which she is now only able to consume small meals.  He denies overt fevers or chills but does note that his current weight at 165 is below his previously estimated dry weight of ~175.  He very much like to have the etiology of his diarrhea made known.  Objective:  Vital signs in last 24 hours: Vitals:   03/28/19 0051 03/28/19 0054 03/28/19 0100 03/28/19 0441  BP: (!) 158/146 (!) 154/99  (!) 95/32  Pulse: 81 (!) 110  (!) 101  Resp: 16 16  20   Temp:    98.8 F (37.1 C)  TempSrc:    Oral  SpO2: 100% 100%  100%  Weight:   75.3 kg   Height:       General: A/O x4, in no acute distress, afebrile, nondiaphoretic HEENT: PEERL, EMO intact Cardio: RRR, no mrg's  Pulmonary: CTA bilaterally, no wheezing or crackles  Abdomen: Bowel sounds normal, soft, generally nontender with only mild tenderness suprapubically MSK: BLE nontender, nonedematous Psych: Appropriate affect, not depressed in appearance, engages well  Assessment/Plan:  Principal Problem:   Hypotension Active Problems:   Renal transplant recipient / ESRD   Diabetes mellitus without complication ([place])   Diarrhea   Peritonitis associated with peritoneal dialysis [facility])  Patient is a 48 year old male with past medical history notable for ESRD and renal transplant now subsequently rejected who presented to the emergency department for hypertension and 3-week history of diarrhea.  Given his chronic immunosuppression with prednisone and tacrolimus there is concern for EBV versus CMV colitis after stool test for C. difficile.  Hypotension: ESRD:  Likely secondary to acute diarrhea and peritoneal dialysis.  Patient was given 2 L in the ED and appears to have a more stable blood pressure at this time with improvement in his tachycardia as well.  Although he has mildly decreased skin turgor and appears minimally dry given his increased blood pressure I feel it fluids are contraindicated.  There is no current sign of overt infection or sepsis in conjunction with his hypotension as the patient is afebrile, with a normal to WBC count of 5.2, and otherwise otherwise appears stable physical exam.  Peritoneal cell count with WBCs of 49, neutrophils 86% which is rather unremarkable.  No indication for additional antibiotic therapy based on this result. - Await completion peritoneal dialysis determine symptomatic management - Treat the patient's underlying GI losses - Appreciate nephrology recommendations and assistance with managing the patient's electrolytes - Follow-up on peritoneal fluid cell count - Initiate appropriate antibiotic therapy following results pending cultures  Subacute diarrhea: Etiology uncertain.  C. difficile negative.  As this preceeded antibiotics C diff was and remains unlikely.  Awaiting results of the GI PCR stool panel.  Serum cultures in process.  I feel he is at risk for CMV colitis given his chronic immune suppression versus other opportunistic infection. - We will obtain a trough tacrolimus level which will be drawn tomorrow morning approximately 20 hours after this morning's dose  - Consider GI consult if stool panel unremarkable  Code: Full Fluids: Peritoneal dialysis Diet: Renal Carb modified DVT PPX: SCD's Dispo: Anticipated discharge in  approximately 2-3 day(s).   James Ludwig, MD 03/28/2019, 6:53 AM Pager: # [phone]

## 2019-03-28 NOTE — Consult Note (Addendum)
[name] Renal Consultation Note    Indication for Consultation:  Management of ESRD/hemodialysis, anemia, hypertension/volume, and secondary hyperparathyroidism. PCP:  HPI: James [name] is a 48 y.o. 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  He tells me that BP has been low for few weeks, he has held all of his BP meds and changed PD to 1.5% fluids without improvement. Was feeling very dizzy with standing which prompted his ED evaluation. Recently treated for peritonitis - just finished antibiotics. C/o green diarrhea for a month and occ nausea/vomiting - this preceded antibiotic course. Doesn't think meds (esp binders) are the cause of his diarrhea because he has been taking all of his meds for months prior to this issue. He had abd CT 8/22 which showed possible colitis. Denies fever or chills, no night sweats. Has been losing weight recently. No sharp abdominal pains on admit, but today he is having RLQ pain with minimal touch (over site of Tx kidney).  Since admit, he has been given ~2L IVFs. C.diff negative. Stool panel pending. Potassium up today.  From a dialysis standpoint, has been doing PD at home without major issues aside from above.  Last PD session was Thursday 9/3 night.   Past Medical History:  Diagnosis Date  . Anemia   . Diabetes mellitus without complication ([place])   . Hypertension   . Influenza A   . Pneumonia 04/21/2017  . Renal disorder   . 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 He then got HD in [place] until getting a renal transplant in 2009 at [place], [place].  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    . Smoker   . Tachycardia   . Transplant recipient    Past Surgical History:  Procedure Laterality Date  . CHOLECYSTECTOMY    . HIP SURGERY    . INSERTION OF DIALYSIS CATHETER Left 04/24/2017   Procedure: EXCHANGE  OF DIALYSIS CATHETER;  Surgeon: Rosetta Posner, MD;  Location: [facility];  Service: Vascular;  Laterality: Left;  . KIDNEY TRANSPLANT    . PERITONEAL CATHETER INSERTION Left 08/2017   Family History  Adopted: Yes   Social History:  reports that he has quit smoking. He smoked 0.20 packs per day. He has never used smokeless tobacco. He reports that he does not drink alcohol or use drugs.  ROS: As per HPI otherwise negative.  Physical Exam: Vitals:   03/28/19 0657 03/28/19 0658 03/28/19 0949 03/28/19 1237  BP: 109/68 109/68 (!) 149/62   Pulse: 92 92 (!) 111 99  Resp:   18   Temp:   98.2 F (36.8 C)   TempSrc:   Oral   SpO2:   99%   Weight:      Height:         General: Well developed, well nourished, in no acute distress. Head: Normocephalic, atraumatic, sclera non-icteric, mucus membranes are moist. Neck: Supple without lymphadenopathy/masses. JVD not elevated. Lungs: Clear bilaterally to auscultation without wheezes, rales, or rhonchi. Breathing is  unlabored. Heart: RRR with normal S1, S2. No murmurs, rubs, or gallops appreciated. Abdomen: Soft, tenderness to palpation in RLQ without guarding. Musculoskeletal:  Strength and tone appear normal for age. Lower extremities: No edema or ischemic changes, no open wounds. Neuro: Alert and oriented X 3. Moves all extremities spontaneously. Psych:  Responds to questions appropriately with a normal affect. Dialysis Access: PD cath in mid L abdomen, no TTP or drainage.  Allergies  Allergen Reactions  . Tramadol Hcl Shortness Of Breath and Other (See Comments)    Tramadol HCl = Shortness of breath and Tramadol = Rash  . Nsaids Other (See Comments)     Was told by MD to not take these  . Darvon [Propoxyphene] Rash and Other (See Comments)    Broke out on eyelid   . Tramadol Rash and Other (See Comments)    Tramadol = Rash and Tramadol HCl = Shortness of breath   Prior to Admission medications   Medication Sig Start Date End Date Taking? Authorizing Provider  acetaminophen (TYLENOL) 500 MG tablet Take 500-1,000 mg by mouth every 6 (six) hours as needed (pain or headaches).    Yes [provider]  calcitRIOL (ROCALTROL) 0.25 MCG capsule Take 0.75 mcg by mouth daily.   Yes [provider]  diphenhydrAMINE (BENADRYL) 25 mg capsule Take 25 mg by mouth every 6 (six) hours as needed for itching.   Yes [provider]  ferric citrate (AURYXIA) 1 GM 210 MG(Fe) tablet Take 210-420 mg by mouth See admin instructions. Take 210-420 mg by mouth one to three times a day with meals and 210 mg with each snack   Yes [provider]  ferrous sulfate 325 (65 FE) MG tablet Take 325 mg by mouth 2 (two) times daily with a meal.   Yes [provider]  insulin aspart (NOVOLOG FLEXPEN) 100 UNIT/ML FlexPen Inject 10 Units into the skin 3 (three) times daily with meals. Patient taking differently: Inject 2-10 Units into the skin See admin instructions. Inject 2-10 units into the skin three times a day with meals, per sliding scale 04/25/17  Yes Mariel Aloe, MD  Insulin Detemir (LEVEMIR FLEXTOUCH) 100 UNIT/ML Pen Inject 15 Units into the skin at bedtime. Patient taking differently: Inject 15 Units into the skin every other day.  09/23/17  Yes Regalado, Belkys A, MD  latanoprost (XALATAN) 0.005 % ophthalmic solution Place 1 drop into both eyes at bedtime.   Yes [provider]  loperamide (IMODIUM) 2 MG capsule Take 2 mg by mouth as needed for diarrhea or loose stools.    Yes [provider]  Melatonin 5 MG CAPS Take 5 mg by mouth at bedtime.    Yes [provider]  Multiple Vitamins-Minerals  (CENTRUM MEN) TABS Take 1 tablet by mouth 2 (two) times daily.   Yes [provider]  omeprazole (PRILOSEC) 20 MG capsule Take 20 mg by mouth daily.   Yes [provider]  potassium chloride SA (K-DUR) 20 MEQ tablet Take 20 mEq by mouth every Monday, Wednesday, and Friday. 10/15/18  Yes [provider]  predniSONE (DELTASONE) 5 MG tablet Take 5 mg by mouth daily with breakfast.   Yes [provider]  promethazine (PHENERGAN) 25 MG tablet Take 25 mg by mouth every 6 (six) hours as needed for nausea/vomiting. 03/23/19 03/30/19 Yes [provider]  sodium bicarbonate 650 MG tablet Take 1,300 mg by mouth 3 (three) times daily.    Yes [provider]  tacrolimus (PROGRAF) 0.5 MG capsule Take 0.5 mg by mouth daily.    Yes [provider]  valGANciclovir (VALCYTE) 450 MG tablet Take 450 mg by mouth See admin instructions. Take one tablet (450 mg) by mouth on Monday, Wednesday, Friday before dialysis 08/22/17  Yes [provider]  zolpidem (AMBIEN) 10 MG tablet Take 10 mg by mouth at bedtime as needed for sleep.    Yes [provider]  amLODipine (NORVASC) 5 MG tablet Take 1 tablet (5 mg total) by mouth daily. Patient taking differently: Take 5 mg by mouth 2 (two) times daily.  08/16/15   Liberty Handy, MD  doxazosin (CARDURA) 4 MG tablet Take 4 mg by mouth at bedtime. 12/25/18   [provider]  lisinopril (ZESTRIL) 10 MG tablet Take 10 mg by mouth daily.    [provider]  metoprolol tartrate (LOPRESSOR) 100 MG tablet Take 100 mg by mouth 2 (two) times daily.     [provider]   Current Facility-Administered Medications  Medication Dose Route Frequency Provider Last Rate Last Dose  . calcitRIOL (ROCALTROL) capsule 0.75 mcg  0.75 mcg Oral Daily Seawell, Jaimie A, DO   0.75 mcg at 03/28/19 0919  . dialysis solution 1.5% low-MG/low-CA dianeal solution   Intraperitoneal Q24H Loren Racer, PA-C      .  ferric citrate (AURYXIA) tablet 210 mg  210 mg Oral Q supper Seawell, Jaimie A, DO      . gentamicin cream (GARAMYCIN) 0.1 % 1 application  1 application Topical Daily Stovall, Kathryn R, PA-C      . latanoprost (XALATAN) 0.005 % ophthalmic solution 1 drop  1 drop Both Eyes QHS Seawell, Jaimie A, DO      . pantoprazole (PROTONIX) EC tablet 20 mg  20 mg Oral Daily Seawell, Jaimie A, DO   20 mg at 03/28/19 0919  . predniSONE (DELTASONE) tablet 5 mg  5 mg Oral Q breakfast Seawell, Jaimie A, DO   5 mg at 03/28/19 0919  . promethazine (PHENERGAN) injection 25 mg  25 mg Intravenous Q6H PRN Kathi Ludwig, MD   25 mg at 03/28/19 1116  . sodium bicarbonate tablet 1,300 mg  1,300 mg Oral TID Seawell, Jaimie A, DO   1,300 mg at 03/28/19 0919  . sodium zirconium cyclosilicate (LOKELMA) packet 10 g  10 g Oral Daily Loren Racer, PA-C      . tacrolimus (PROGRAF) capsule 0.5 mg  0.5 mg Oral Daily Seawell, Jaimie A, DO   0.5 mg at 03/28/19 0919  . [START ON 03/30/2019] valGANciclovir (VALCYTE) 450 MG tablet TABS 450 mg  450 mg Oral Q M,W,F Seawell, Jaimie A, DO       Labs: Basic Metabolic Panel: Recent Labs  Lab 03/27/19 1645 03/27/19 2337 03/28/19 0226 03/28/19 0544  NA 137  --  138 138  K 4.2  --  5.1 5.8*  CL 92*  --  100 97*  CO2 26  --  17* 23  GLUCOSE 102*  --  72 76  BUN 34*  --  37* 37*  CREATININE 20.30*  --  21.98* 22.49*  CALCIUM 9.2  --  8.5* 8.6*  PHOS  --  6.3*  --   --    Liver Function Tests: Recent Labs  Lab 03/27/19 1645 03/28/19 0226  AST 23 23  ALT 27 23  ALKPHOS 73 52  BILITOT 0.9 1.0  PROT 7.1 6.0*  ALBUMIN 2.9* 2.5*   CBC: Recent Labs  Lab  03/27/19 1645 03/28/19 0544  WBC 7.1 5.2  NEUTROABS 4.7  --   HGB 8.5* 7.0*  HCT 24.8* 20.3*  MCV 112.7* 112.2*  PLT 117* 102*   Studies/Results: Dg Chest Portable 1 View  Result Date: 03/27/2019 CLINICAL DATA:  Pt arrives from home c/o hypotension after peritoneal dialysis today. Also episodes of diarrhea.  EXAM: PORTABLE CHEST 1 VIEW COMPARISON:  03/14/2019 and multiple prior studies including 04/19/2017 FINDINGS: The heart size is normal. Slight prominence of the AP window region shows long-term stability. No focal consolidations or pleural effusions. No pulmonary edema. IMPRESSION: No active disease. Electronically Signed   By: Nolon Nations M.D.   On: 03/27/2019 16:44    Dialysis Orders:  CCPD, 5 exchanges x 2L volume, followed by 1L last fill - drain mid-day. 1:30 dwell time. No additives. Typically using 2.5% dextrose, but 1.5% lately d/t low BP.  Assessment/Plan: 1.  Orthostatic hypotension: Suspect volume depleted d/t ongoing diarrhea. Had received IVFs here and meds on hold. WBC stable. No signs of recurrent peritonitis, ?maybe UTI. 2.  ESRD on CCPD: PD ordered for tonight - 1.5% dextrose (minimal UF). He is slightly tender overlying Tx kidney today, afebrile - will need to consider repeat abd imaging - either u/s or CT. 3.  Hyperkalemia: Odd considering ongoing diarrhea. 1 dose Lokelma today. 4.  Diarrhea: Dark green coloration - could be bacterial such as giardia, but also may be combination between Auryxia (iron) and bile diarrhea. Await GI panel. Agree that colonoscopy would be next step. 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 Full complicated Hx can be read in "care everywhere" from [facility] - sees oncology regularly. 5.  Anemia: Hgb 7 - unclear when last ESA given, will plan to give tomorrow (Aranesp 100). 6.  Metabolic bone disease: Ca ok, Phos high - continue Auryxia as binder. 7.  Thrombocytopenia: Follow - no heparin for now.  Veneta Penton, PA-C 03/28/2019, 3:16 PM  [facility] Pager: [phone]  Pt seen, examined and agree w A/P as above.   Kelly Splinter  MD 03/28/2019, 4:11 PM

## 2019-03-28 NOTE — Plan of Care (Signed)
  Problem: Activity: Goal: Risk for activity intolerance will decrease Outcome: Progressing   

## 2019-03-28 NOTE — Progress Notes (Signed)
Spoke with Hemodialysis RN and made aware of patient needing Cell count drawn from his peritoneal catheter. RN is dialyzing a patient and unable to leave at this time but will have day team draw it. MD on call made aware. Bren Steers, Wonda Cheng, Therapist, sports

## 2019-03-28 NOTE — Progress Notes (Signed)
Responded to [place]. Prayed with patient upon request. Will continue to provide spiritual care as needed.

## 2019-03-28 NOTE — ED Notes (Signed)
ED TO INPATIENT HANDOFF REPORT  ED Nurse Name and Phone #: [phone], Fonda RN  S Name/Age/Gender James [name] 48 y.o. male Room/Bed: 044C/044C  Code Status   Code Status: Full Code  Home/SNF/Other Home Patient oriented to: self, place, time and situation Is this baseline? Yes   Triage Complete: Triage complete  Chief Complaint N/A dialysis pt  Triage Note Pt arrives with [facility] from home c/o hypotension after peritoneal dialysis. Per EMS, pt was taken off of BP meds ~1 week now due to decreased blood pressures from dialysis. BP with EMS was 100/60; 300 mL NS bolus given.   Allergies Allergies  Allergen Reactions  . Tramadol Hcl Shortness Of Breath and Other (See Comments)    Tramadol HCl = Shortness of breath and Tramadol = Rash  . Nsaids Other (See Comments)    Was told by MD to not take these  . Darvon [Propoxyphene] Rash and Other (See Comments)    Broke out on eyelid   . Tramadol Rash and Other (See Comments)    Tramadol = Rash and Tramadol HCl = Shortness of breath    Level of Care/Admitting Diagnosis ED Disposition    ED Disposition Condition Comment   Admit  Hospital Area: [facility] [[id number]]  Level of Care: Telemetry Medical [104]  Covid Evaluation: Confirmed COVID Negative  Diagnosis: Hypotension [431540]  Admitting Physician: Axel Filler [[id number]]  Attending Physician: Axel Filler [[id number]]  Estimated length of stay: past midnight tomorrow  Certification:: I certify this patient will need inpatient services for at least 2 midnights  PT Class (Do Not Modify): Inpatient [101]  PT Acc Code (Do Not Modify): Private [1]       B Medical/Surgery History Past Medical History:  Diagnosis Date  . Anemia   . Diabetes mellitus without complication ([place])   . Hypertension   . Influenza A   . Pneumonia 04/21/2017  . Renal disorder   . 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 He then got HD in [facility] until getting a renal transplant in 2009 at [place], [place].  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    . Smoker   . Tachycardia   . Transplant recipient    Past Surgical History:  Procedure Laterality Date  . CHOLECYSTECTOMY    . HIP SURGERY    . INSERTION OF DIALYSIS CATHETER Left 04/24/2017   Procedure: EXCHANGE  OF DIALYSIS CATHETER;  Surgeon: Rosetta Posner, MD;  Location: [facility];  Service: Vascular;  Laterality: Left;  . KIDNEY TRANSPLANT    . PERITONEAL CATHETER INSERTION Left 08/2017     A IV Location/Drains/Wounds Patient Lines/Drains/Airways Status   Active Line/Drains/Airways    Name:   Placement date:   Placement time:   Site:   Days:   Peripheral IV 03/27/19 Left Forearm   03/27/19    1708    Forearm   less than 1   Fistula / Graft Right Upper arm Arteriovenous fistula   03/14/19    0051    Upper arm   13   Hemodialysis Catheter Left Internal jugular Double-lumen   04/19/17  1845    Internal jugular   707   Hemodialysis Catheter Left Internal jugular Double-lumen;Permanent   04/24/17    1626    Internal jugular   702   Incision (Closed) 04/24/17 Neck Left   04/24/17    1647     702          Intake/Output Last 24 hours No intake or output data in the 24 hours ending 03/27/19 2358  Labs/Imaging Results for orders placed or performed during the hospital encounter of 03/27/19 (from the past 48 hour(s))  C Difficile Quick Screen w PCR reflex     Status: None   Collection Time: 03/27/19  4:36 PM   Specimen: STOOL  Result Value Ref Range   C Diff antigen NEGATIVE NEGATIVE   C Diff toxin NEGATIVE NEGATIVE   C Diff interpretation No C. difficile detected.     Comment: Performed at [facility], [place] [address] [place], [place] [address]  Comprehensive metabolic panel     Status: Abnormal   Collection Time: 03/27/19  4:45 PM  Result Value Ref Range   Sodium 137 135 - 145 mmol/L   Potassium 4.2 3.5 - 5.1 mmol/L   Chloride 92 (L) 98 - 111 mmol/L   CO2 26 22 - 32 mmol/L   Glucose, Bld 102 (H) 70 - 99 mg/dL   BUN 34 (H) 6 - 20 mg/dL   Creatinine, Ser 20.30 (H) 0.61 - 1.24 mg/dL   Calcium 9.2 8.9 - 10.3 mg/dL   Total Protein 7.1 6.5 - 8.1 g/dL   Albumin 2.9 (L) 3.5 - 5.0 g/dL   AST 23 15 - 41 U/L   ALT 27 0 - 44 U/L   Alkaline Phosphatase 73 38 - 126 U/L   Total Bilirubin 0.9 0.3 - 1.2 mg/dL   GFR calc non Af Amer 2 (L) >60 mL/min   GFR calc Af Amer 3 (L) >60 mL/min   Anion gap 19 (H) 5 - 15    Comment: Performed at [facility], [place] [address], [place], [place] [address]  CBC with Differential     Status: Abnormal   Collection Time: 03/27/19  4:45 PM  Result Value Ref Range   WBC 7.1 4.0 - 10.5 K/uL   RBC 2.20 (L) 4.22 - 5.81 MIL/uL   Hemoglobin 8.5 (L) 13.0 - 17.0 g/dL   HCT 24.8 (L) 39.0 - 52.0 %   MCV 112.7 (H) 80.0 - 100.0 fL   MCH 38.6 (H) 26.0 - 34.0 pg   MCHC 34.3 30.0 - 36.0 g/dL   RDW 13.7 11.5 - 15.5 %   Platelets 117 (L) 150 - 400 K/uL    Comment: REPEATED TO VERIFY PLATELET COUNT CONFIRMED BY SMEAR SPECIMEN CHECKED FOR CLOTS Immature Platelet Fraction may be clinically indicated, consider ordering this additional test [id number]    nRBC 0.0 0.0 - 0.2 %   Neutrophils Relative % 66 %   Neutro Abs 4.7 1.7 - 7.7 K/uL   Lymphocytes Relative 24 %   Lymphs Abs 1.7 0.7 - 4.0 K/uL   Monocytes Relative 10 %   Monocytes Absolute 0.7 0.1 - 1.0 K/uL   Eosinophils Relative 0 %   Eosinophils Absolute 0.0 0.0 - 0.5 K/uL   Basophils Relative 0 %   Basophils Absolute 0.0 0.0 - 0.1 K/uL   WBC Morphology FEW ATYPICAL LYMPHS NOTED    nRBC 0 0 /100 WBC   Abs Immature Granulocytes 0.00 0.00 - 0.07  K/uL   Tear Drop Cells PRESENT    Polychromasia PRESENT     Comment: Performed at  [facility], [place] [address] [place], [place] [address]  Lactic acid, plasma     Status: Abnormal   Collection Time: 03/27/19  4:45 PM  Result Value Ref Range   Lactic Acid, Venous 2.0 (HH) 0.5 - 1.9 mmol/L    Comment: CRITICAL RESULT CALLED TO, READ BACK BY AND VERIFIED WITH: N ZOHBI RN AT 6045 ON [id number] BY Marcos Eke Performed at [facility], [place] [address] [place], [place] [address]   CBG monitoring, ED     Status: None   Collection Time: 03/27/19  6:57 PM  Result Value Ref Range   Glucose-Capillary 84 70 - 99 mg/dL   Dg Chest Portable 1 View  Result Date: 03/27/2019 CLINICAL DATA:  Pt arrives from home c/o hypotension after peritoneal dialysis today. Also episodes of diarrhea. EXAM: PORTABLE CHEST 1 VIEW COMPARISON:  03/14/2019 and multiple prior studies including 04/19/2017 FINDINGS: The heart size is normal. Slight prominence of the AP window region shows long-term stability. No focal consolidations or pleural effusions. No pulmonary edema. IMPRESSION: No active disease. Electronically Signed   By: Nolon Nations M.D.   On: 03/27/2019 16:44    Pending Labs Unresulted Labs (From admission, onward)    Start     Ordered   03/28/19 0500  Comprehensive metabolic panel  Tomorrow morning,   R     03/27/19 2017   03/28/19 0500  CBC  Tomorrow morning,   R     03/27/19 2017   03/27/19 2007  APTT  Once,   STAT     03/27/19 2017   03/27/19 2007  Protime-INR  Once,   STAT     03/27/19 2017   03/27/19 2007  Magnesium  Once,   STAT     03/27/19 2017   03/27/19 2007  Phosphorus  Once,   STAT     03/27/19 2017   03/27/19 1853  Gram stain  ONCE - STAT,   STAT     03/27/19 1852   03/27/19 1852  SARS CORONAVIRUS 2 (TAT 6-24 HRS) Nasopharyngeal Nasopharyngeal Swab  (Asymptomatic/Tier 2 Patients Labs)  Once,   STAT    Question Answer Comment  Is this test for diagnosis or screening Screening   Symptomatic for COVID-19 as defined by CDC No   Hospitalized for COVID-19 No    Admitted to ICU for COVID-19 No   Previously tested for COVID-19 Yes   Resident in a congregate (group) care setting No   Employed in healthcare setting No      03/27/19 1851   03/27/19 1852  Body fluid cell count with differential  ONCE - STAT,   STAT    Question:  Are there also cytology or pathology orders on this specimen?  Answer:  No   03/27/19 1852   03/27/19 1852  Body fluid culture  ONCE - STAT,   STAT     03/27/19 1852   03/27/19 1613  Gastrointestinal Panel by PCR , Stool  (Gastrointestinal Panel by PCR, Stool                                                                                                                                                     *  Does Not include CLOSTRIDIUM DIFFICILE testing.**If CDIFF testing is needed, select the C Difficile Quick Screen w PCR reflex order below)  Once,   STAT     03/27/19 1612   03/27/19 1611  Culture, blood (routine x 2)  BLOOD CULTURE X 2,   STAT     03/27/19 1611   03/27/19 1611  Urinalysis, Routine w reflex microscopic  ONCE - STAT,   STAT     03/27/19 1611   03/27/19 1611  Urine culture  ONCE - STAT,   STAT     03/27/19 1611   03/27/19 1611  Lactic acid, plasma  Now then every 2 hours,   STAT     03/27/19 1611          Vitals/Pain Today's Vitals   03/27/19 2243 03/27/19 2330 03/27/19 2331 03/27/19 2345  BP:  106/65 122/68 (!) 115/52  Pulse:      Resp:  17  (!) 21  Temp:      TempSrc:      SpO2:      PainSc: 0-No pain       Isolation Precautions Enteric precautions (UV disinfection)  Medications Medications  valGANciclovir (VALCYTE) 450 MG tablet TABS 450 mg (has no administration in time range)  calcitRIOL (ROCALTROL) capsule 0.75 mcg (has no administration in time range)  predniSONE (DELTASONE) tablet 5 mg (has no administration in time range)  ferric citrate (AURYXIA) tablet 210 mg (has no administration in time range)  pantoprazole (PROTONIX) EC tablet 20 mg (has no administration in time range)   sodium bicarbonate tablet 1,300 mg (has no administration in time range)  tacrolimus (PROGRAF) capsule 0.5 mg (has no administration in time range)  potassium chloride SA (K-DUR) CR tablet 20 mEq (has no administration in time range)  latanoprost (XALATAN) 0.005 % ophthalmic solution 1 drop (has no administration in time range)  sodium chloride 0.9 % bolus 1,000 mL (0 mLs Intravenous Stopped 03/27/19 2119)  promethazine (PHENERGAN) injection 25 mg (25 mg Intravenous Given 03/27/19 1712)  sodium chloride 0.9 % bolus 1,000 mL (0 mLs Intravenous Stopped 03/27/19 2338)    Mobility walks Moderate fall risk   Focused Assessments Cardiac Assessment Handoff:    No results found for: CKTOTAL, CKMB, CKMBINDEX, TROPONINI No results found for: DDIMER Does the Patient currently have chest pain? No      R Recommendations: See Admitting Provider Note  Report given to:   Additional Notes:  Patient is A/O and ambulatory. BP has been taken in the calf d/t patient preference. Hx. Of AV Fistulas.

## 2019-03-29 LAB — RENAL FUNCTION PANEL
Albumin: 2.7 g/dL — ABNORMAL LOW (ref 3.5–5.0)
Anion gap: 18 — ABNORMAL HIGH (ref 5–15)
BUN: 42 mg/dL — ABNORMAL HIGH (ref 6–20)
CO2: 24 mmol/L (ref 22–32)
Calcium: 9.1 mg/dL (ref 8.9–10.3)
Chloride: 101 mmol/L (ref 98–111)
Creatinine, Ser: 21.95 mg/dL — ABNORMAL HIGH (ref 0.61–1.24)
GFR calc Af Amer: 2 mL/min — ABNORMAL LOW (ref >60)
GFR calc non Af Amer: 2 mL/min — ABNORMAL LOW (ref >60)
Glucose, Bld: 154 mg/dL — ABNORMAL HIGH (ref 70–99)
Phosphorus: 6.8 mg/dL — ABNORMAL HIGH (ref 2.5–4.6)
Potassium: 4 mmol/L (ref 3.5–5.1)
Sodium: 143 mmol/L (ref 135–145)

## 2019-03-29 LAB — URINE CULTURE: Culture: >=100000 — AB

## 2019-03-29 MED ORDER — DELFLEX-LC/1.5% DEXTROSE 344 MOSM/L IP SOLN
INTRAPERITONEAL | Status: DC
  Administered 2019-03-30: 5000 mL via INTRAPERITONEAL

## 2019-03-29 MED ORDER — VANCOMYCIN VARIABLE DOSE PER UNSTABLE RENAL FUNCTION (PHARMACIST DOSING): Status: DC

## 2019-03-29 MED ORDER — PEG 3350-KCL-NA BICARB-NACL 420 G PO SOLR
4000.0000 mL | Freq: Once | ORAL | Status: AC
  Administered 2019-03-30: 4000 mL via ORAL
  Filled 2019-03-29: qty 4000

## 2019-03-29 MED ORDER — VANCOMYCIN HCL 10 G IV SOLR
2000.0000 mg | Freq: Once | INTRAVENOUS | Status: AC
  Administered 2019-03-29: 2000 mg via INTRAVENOUS
  Filled 2019-03-29: qty 2000

## 2019-03-29 MED ORDER — METOPROLOL TARTRATE 100 MG PO TABS
100.0000 mg | ORAL_TABLET | Freq: Two times a day (BID) | ORAL | Status: DC
  Administered 2019-03-29 – 2019-04-01 (×7): 100 mg via ORAL
  Filled 2019-03-29 (×7): qty 1

## 2019-03-29 NOTE — Progress Notes (Signed)
  Subjective:  Patient reports that he is doing well this morning.  Reports continued loose bowel movements but says he has only had 2 loose bowel months over the past day or has he had 4 to 5/day for the 3 weeks prior to presentation.  Patient reports that he otherwise feels well.   Objective:    Vital Signs (last 24 hours): Vitals:   03/28/19 1823 03/29/19 0442 03/29/19 0829 03/29/19 1121  BP: (!) 133/52 (!) 122/39 (!) 121/48 (!) 130/51  Pulse: 99 (!) 116 85   Resp: 18 18 18    Temp: 97.7 F (36.5 C) 98.4 F (36.9 C) 98.2 F (36.8 C)   TempSrc: Oral Oral Oral   SpO2: 100% 100% 100%   Weight:      Height:         Physical Exam: General Alert and answers questions appropriately, no acute distress  Cardiac Regular rate and rhythm, no murmurs, rubs, or gallops  Pulmonary Clear to auscultation bilaterally without wheezes, rhonchi, or rales  Abdominal Soft, non-tender, without distention. Bowel sounds present  Extremities No peripheral edema      Assessment/Plan:   Principal Problem:   Hypotension Active Problems:   ESRD on peritoneal dialysis [facility])   Diarrhea   Renal transplant recipient  Patient is a 48 year old male with past medical history notable for ESRD and renal transplant now subsequently rejected who presented to the emergency department for 3-week history of diarrhea.  Given chronic immunosuppression with prednisone and tacrolimus there is concern for EBV versus CMV colitis.  ESRD: Hypotension: Thought to be secondary to acute diarrhea and peritoneal dialysis.  Patient with improved blood pressures overnight with systolics ranging from 496-759.  Patient has remained tachycardic with heart rate ranging from 103-160.  No signs of infection or sepsis to explain hypotension-patient afebrile with WBC within normal limits and appears stable on exam.  Primary cultures reveal gram-positive cocci but this may represent skin contaminant. * Patient received peritoneal  dialysis yesterday * We will follow-up peritoneal fluid culture BCID  Subacute diarrhea: Currently unclear etiology, C. difficile was negative and GI PCR stool panel was negative. Blood cultures negative at 2 days.  There is concern for CMV colitis given chronic immunosuppression. * Tacrolimus level pending * GI consulted who will evaluate patient and is considering appropriateness of colonoscopy   Dispo: Anticipated discharge pending diagnostic work-up and clinical improvement  Jeanmarie Hubert, MD 03/29/2019, 1:43 PM Pager: [phone]

## 2019-03-29 NOTE — Progress Notes (Signed)
Pharmacy Antibiotic Note  James [name] is a 48 y.o. male admitted on 03/27/2019 with abdominal pain and possible peritonitis. GPCs growing within peritoneal fluid culture. Pharmacy has been consulted for vancomycin dosing. 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 Pt recently admitted for peritonitis and finished course of vancomycin and cefepime.  Plan: Vancomycin 2000mg  IV x1 Will check vancomycin trough in 3 days and redose once < 20 mcg/L   Height: 5\' 6"  (167.6 cm) Weight: 166 lb 0.1 oz (75.3 kg) IBW/kg (Calculated) : 63.8  Temp (24hrs), Avg:98.1 F (36.7 C), Min:97.7 F (36.5 C), Max:98.4 F (36.9 C)  Recent Labs  Lab 03/27/19 1645 03/27/19 2337 03/28/19 0226 03/28/19 0544 03/29/19 0612  WBC 7.1  --   --  5.2  --   CREATININE 20.30*  --  21.98* 22.49* 21.95*  LATICACIDVEN 2.0* 2.1*  --  1.7  --     Estimated Creatinine Clearance: 3.8 mL/min (A) (by C-G formula based on SCr of 21.95 mg/dL (H)).    Allergies  Allergen Reactions  . Tramadol Hcl Shortness Of Breath and Other (See Comments)    Tramadol HCl = Shortness of breath and Tramadol = Rash  . Nsaids Other (See Comments)    Was told by MD to not take these  . Darvon [Propoxyphene] Rash and Other (See Comments)    Broke out on eyelid   . Tramadol Rash and Other (See Comments)    Tramadol = Rash and Tramadol HCl = Shortness of breath    Antimicrobials this admission: Vancomycin 9/6 >>  Microbiology results: 9/4 BCx: NGTD 9/4 C. Diff: negative 9/4 GI PCR: negative 9/5 UCx: >100k lactobacillus 9/5 Peritoneal Cx: GPCs  Thank you for allowing pharmacy to be a part of this patient's care.   Arrie Senate, PharmD, BCPS Clinical Pharmacist [phone] Please check AMION for all [facility] numbers 03/29/2019

## 2019-03-29 NOTE — Consult Note (Addendum)
Referring Provider: Dr. Lalla Brothers (internal medicine teaching service) Primary Care Physician:  Patient, No Pcp Per Primary Gastroenterologist: None (unassigned; previous colonoscopy at [facility] as part of transplant evaluation)  Reason for Consultation: Diarrhea  HPI: James [name] is a 48 y.o. male with end-stage renal disease at the age of 22 due to uncontrolled hypertension (by his report), status post renal transplant in 2009 at [facility] which failed and prompted his current treatment of peritoneal dialysis, history of B-cell lymphoma, also history of type 2 diabetes. The patient indicates he restarted peritoneal dialysis in 2018, when his transplant kidney finally "gave out" after 10 years, including 3 episodes of rejection and treatment for lymphoma.  Several weeks ago, he had the gradual but progressively severe onset of diarrhea.  At baseline, the patient reports 3 well-formed bowel movements per day, with some variability of bowel habit in association with his peritoneal dialysis treatments.  He started to have as many as 5 or 6 very liquidy, sometimes almost watery, but nonbloody bowel movements per day, including nocturnal diarrhea.  Around this time, he was found to have peritonitis characterized by an increased white count and his peritoneal dialysis fluid, for which he has been on Vanco and another antibiotic which she was not sure of the name of.    He was also having some upper tract symptoms with nausea and vomiting.  He became dehydrated and was admitted to the hospital 2 days ago, requiring intravenous fluid supplementation.    Gradually, his diarrhea has been improving recently.  Today, for example, he reports only 2 bowel movements and they are becoming somewhat more solid.  The patient indicates that he did undergo colonoscopy as part of a pretransplant evaluation at [facility] about 10 years ago; he is not aware of any polyps having been present.   However, he is concerned about increased risk of colon cancer since he is chronically immunosuppressed with tacrolimus.   Past Medical History:  Diagnosis Date  . Anemia   . Diabetes mellitus without complication ([place])   . Hypertension   . Influenza A   . Pneumonia 04/21/2017  . Renal disorder   . 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 He then got HD in [facility] until getting a renal transplant in 2009 at [place], [place].  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    . Smoker   . Tachycardia   . Transplant recipient     Past Surgical History:  Procedure Laterality Date  . CHOLECYSTECTOMY    . HIP SURGERY    . INSERTION OF DIALYSIS CATHETER Left 04/24/2017   Procedure: EXCHANGE  OF DIALYSIS CATHETER;  Surgeon: Rosetta Posner, MD;  Location: [place];  Service: Vascular;  Laterality: Left;  . KIDNEY TRANSPLANT    . PERITONEAL CATHETER INSERTION Left 08/2017    Prior to Admission medications   Medication Sig Start Date End Date Taking? Authorizing Provider  acetaminophen (TYLENOL) 500 MG tablet Take 500-1,000 mg by mouth every 6 (six) hours as needed (pain or headaches).    Yes [provider]  calcitRIOL (ROCALTROL) 0.25 MCG capsule Take 0.75 mcg by mouth daily.  Yes [provider]  diphenhydrAMINE (BENADRYL) 25 mg capsule Take 25 mg by mouth every 6 (six) hours as needed for itching.   Yes [provider]  ferric citrate (AURYXIA) 1 GM 210 MG(Fe) tablet Take 210-420 mg by mouth See admin instructions. Take 210-420 mg by mouth one to three times a day with meals and 210 mg with each snack   Yes [provider]  ferrous sulfate 325 (65 FE) MG tablet Take 325 mg by mouth 2 (two) times daily with a  meal.   Yes [provider]  insulin aspart (NOVOLOG FLEXPEN) 100 UNIT/ML FlexPen Inject 10 Units into the skin 3 (three) times daily with meals. Patient taking differently: Inject 2-10 Units into the skin See admin instructions. Inject 2-10 units into the skin three times a day with meals, per sliding scale 04/25/17  Yes Mariel Aloe, MD  Insulin Detemir (LEVEMIR FLEXTOUCH) 100 UNIT/ML Pen Inject 15 Units into the skin at bedtime. Patient taking differently: Inject 15 Units into the skin every other day.  09/23/17  Yes Regalado, Belkys A, MD  latanoprost (XALATAN) 0.005 % ophthalmic solution Place 1 drop into both eyes at bedtime.   Yes [provider]  loperamide (IMODIUM) 2 MG capsule Take 2 mg by mouth as needed for diarrhea or loose stools.    Yes [provider]  Melatonin 5 MG CAPS Take 5 mg by mouth at bedtime.    Yes [provider]  Multiple Vitamins-Minerals (CENTRUM MEN) TABS Take 1 tablet by mouth 2 (two) times daily.   Yes [provider]  omeprazole (PRILOSEC) 20 MG capsule Take 20 mg by mouth daily.   Yes [provider]  potassium chloride SA (K-DUR) 20 MEQ tablet Take 20 mEq by mouth every Monday, Wednesday, and Friday. 10/15/18  Yes [provider]  predniSONE (DELTASONE) 5 MG tablet Take 5 mg by mouth daily with breakfast.   Yes [provider]  promethazine (PHENERGAN) 25 MG tablet Take 25 mg by mouth every 6 (six) hours as needed for nausea/vomiting. 03/23/19 03/30/19 Yes [provider]  sodium bicarbonate 650 MG tablet Take 1,300 mg by mouth 3 (three) times daily.    Yes [provider]  tacrolimus (PROGRAF) 0.5 MG capsule Take 0.5 mg by mouth daily.    Yes [provider]  valGANciclovir (VALCYTE) 450 MG tablet Take 450 mg by mouth See admin instructions. Take one tablet (450 mg) by mouth on Monday, Wednesday, Friday before dialysis 08/22/17  Yes [provider]  zolpidem  (AMBIEN) 10 MG tablet Take 10 mg by mouth at bedtime as needed for sleep.    Yes [provider]  amLODipine (NORVASC) 5 MG tablet Take 1 tablet (5 mg total) by mouth daily. Patient taking differently: Take 5 mg by mouth 2 (two) times daily.  08/16/15   Liberty Handy, MD  doxazosin (CARDURA) 4 MG tablet Take 4 mg by mouth at bedtime. 12/25/18   [provider]  lisinopril (ZESTRIL) 10 MG tablet Take 10 mg by mouth daily.    [provider]  metoprolol tartrate (LOPRESSOR) 100 MG tablet Take 100 mg by mouth 2 (two) times daily.     [provider]    Current Facility-Administered Medications  Medication Dose Route Frequency Provider Last Rate Last Dose  . calcitRIOL (ROCALTROL) capsule 0.75 mcg  0.75 mcg Oral Daily Seawell, Jaimie A, DO   0.75 mcg at 03/29/19 1007  . Darbepoetin Alfa (ARANESP) injection 100  mcg  100 mcg Subcutaneous Q Sun-1800 Loren Racer, PA-C   100 mcg at 03/29/19 1720  . dialysis solution 1.5% low-MG/low-CA dianeal solution   Intraperitoneal Q24H Loren Racer, PA-C      . ferric citrate (AURYXIA) tablet 210 mg  210 mg Oral Q supper Seawell, Jaimie A, DO   210 mg at 03/29/19 1718  . gentamicin cream (GARAMYCIN) 0.1 % 1 application  1 application Topical Daily Stovall, Woodfin Ganja, PA-C      . latanoprost (XALATAN) 0.005 % ophthalmic solution 1 drop  1 drop Both Eyes QHS Seawell, Jaimie A, DO   1 drop at 03/28/19 2141  . metoprolol tartrate (LOPRESSOR) tablet 100 mg  100 mg Oral BID Kathi Ludwig, MD   100 mg at 03/29/19 1121  . pantoprazole (PROTONIX) EC tablet 20 mg  20 mg Oral Daily Seawell, Jaimie A, DO   20 mg at 03/29/19 1007  . predniSONE (DELTASONE) tablet 5 mg  5 mg Oral Q breakfast Seawell, Jaimie A, DO   5 mg at 03/29/19 0748  . promethazine (PHENERGAN) injection 25 mg  25 mg Intravenous Q6H PRN Kathi Ludwig, MD   25 mg at 03/28/19 1116  . sodium bicarbonate tablet 1,300 mg  1,300 mg Oral TID Seawell, Jaimie A, DO    1,300 mg at 03/29/19 1718  . sodium zirconium cyclosilicate (LOKELMA) packet 10 g  10 g Oral Daily Loren Racer, PA-C   10 g at 03/29/19 1008  . tacrolimus (PROGRAF) capsule 0.5 mg  0.5 mg Oral Daily Seawell, Jaimie A, DO   0.5 mg at 03/29/19 1008  . [START ON 03/30/2019] valGANciclovir (VALCYTE) 450 MG tablet TABS 450 mg  450 mg Oral Q M,W,F Seawell, Jaimie A, DO      . vancomycin (VANCOCIN) 2,000 mg in sodium chloride 0.9 % 500 mL IVPB  2,000 mg Intravenous Once Einar Grad, RPH 250 mL/hr at 03/29/19 1842 2,000 mg at 03/29/19 1842  . vancomycin variable dose per unstable renal function (pharmacist dosing)   Does not apply See admin instructions Einar Grad, [facility]        Allergies as of 03/27/2019 - Review Complete 03/27/2019  Allergen Reaction Noted  . Tramadol hcl Shortness Of Breath and Other (See Comments) 08/05/2009  . Nsaids Other (See Comments) 03/27/2019  . Darvon [propoxyphene] Rash and Other (See Comments) 04/09/2014  . Tramadol Rash and Other (See Comments) 04/09/2014    Family History  Adopted: Yes    Social History   Socioeconomic History  . Marital status: Single    Spouse name: Not on file  . Number of children: Not on file  . Years of education: Not on file  . Highest education level: Not on file  Occupational History  . Not on file  Social Needs  . Financial resource strain: Not on file  . Food insecurity    Worry: Not on file    Inability: Not on file  . Transportation needs    Medical: Not on file    Non-medical: Not on file  Tobacco Use  . Smoking status: Former Smoker    Packs/day: 0.20  . Smokeless tobacco: Never Used  Substance and Sexual Activity  . Alcohol use: No  . Drug use: No  . Sexual activity: Not on file  Lifestyle  . Physical activity    Days per week: Not on file    Minutes per session: Not on file  . Stress: Not on file  Relationships  . Social Herbalist on phone: Not on file    Gets together: Not  on file    Attends religious service: Not on file    Active member of club or organization: Not on file    Attends meetings of clubs or organizations: Not on file    Relationship status: Not on file  . Intimate partner violence    Fear of current or ex partner: Not on file    Emotionally abused: Not on file    Physically abused: Not on file    Forced sexual activity: Not on file  Other Topics Concern  . Not on file  Social History Narrative  . Not on file    Review of Systems: See HPI  Physical Exam: Vital signs in last 24 hours: Temp:  [98.2 F (36.8 C)-98.4 F (36.9 C)] 98.4 F (36.9 C) (09/06 1819) Pulse Rate:  [85-116] 100 (09/06 1819) Resp:  [18] 18 (09/06 1819) BP: (121-130)/(39-56) 126/56 (09/06 1819) SpO2:  [99 %-100 %] 99 % (09/06 1819) Last BM Date: 03/28/19  This is a well-nourished, articulate African-American male in absolutely no distress.  He is anicteric and without overt pallor.  The chest is clear anteriorly, heart is normal, abdomen without overt mass or tenderness.  No peripheral edema.  No evident focal neurologic deficits or cutaneous eruptions.  Mood is normal, no evident anxiety or depression.  Intake/Output from previous day: 09/05 0701 - 09/06 0700 In: 2760 [P.O.:760] Out: 2000  Intake/Output this shift: No intake/output data recorded.  Lab Results: Recent Labs    03/27/19 1645 03/28/19 0544  WBC 7.1 5.2  HGB 8.5* 7.0*  HCT 24.8* 20.3*  PLT 117* 102*   BMET Recent Labs    03/28/19 0226 03/28/19 0544 03/29/19 0612  NA 138 138 143  K 5.1 5.8* 4.0  CL 100 97* 101  CO2 17* 23 24  GLUCOSE 72 76 154*  BUN 37* 37* 42*  CREATININE 21.98* 22.49* 21.95*  CALCIUM 8.5* 8.6* 9.1   LFT Recent Labs    03/28/19 0226 03/29/19 0612  PROT 6.0*  --   ALBUMIN 2.5* 2.7*  AST 23  --   ALT 23  --   ALKPHOS 52  --   BILITOT 1.0  --    PT/INR No results for input(s): LABPROT, INR in the last 72 hours.  Studies/Results: No results  found.  Impression: 1.  Mild to moderate diarrhea, occurring around the time of development of peritonitis but antedating use of antibiotics for his peritoneal fluid  2.  Status post failed renal transplant, currently on peritoneal dialysis  3.  Multiple other medical issues including diabetes and hypertension and history of lymphoma  Plan: The patient and I agree that colonoscopy is appropriate.  This will help to evaluate his diarrhea for such entities as CMV colitis or microscopic colitis, although I tend to doubt the former diagnosis since his diarrhea has not been that severe.  At the same time, it will give him updated screening since it has been about 10 years since his last colonoscopy, and he is at increased risk for colon cancer demographically in view of his African-American ethnicity and the fact he is approaching age 35.    The nature, purpose, and risks of the procedure, as well as the option of observation and conservative management, were reviewed with the patient and he is agreeable to proceed.  The current plan is to do his exam 2 days  from now, using tomorrow as the day 2 get him prepped.  He is aware that my partner might be doing the procedure.  Depending on the colonoscopic findings, the patient might benefit from probiotic therapy or intensified use of Imodium.   LOS: 2 days   [place]  03/29/2019, 7:02 PM   Pager [phone] If no answer or after 5 PM call [phone]

## 2019-03-29 NOTE — Progress Notes (Addendum)
[facility] Progress Note   Subjective:  Seen in room - no new complaints. Abd pain that he was having yesterday has resolved and diarrhea slightly improved. BP stable this morning. PD went fine overnight - treatment complete, although still connected, effluent is clear -> will let dialysis RN know.   Objective Vitals:   03/28/19 1237 03/28/19 1823 03/29/19 0442 03/29/19 0829  BP:  (!) 133/52 (!) 122/39 (!) 121/48  Pulse: 99 99 (!) 116 85  Resp:  18 18 18   Temp:  97.7 F (36.5 C) 98.4 F (36.9 C) 98.2 F (36.8 C)  TempSrc:  Oral Oral Oral  SpO2:  100% 100% 100%  Weight:      Height:       Physical Exam General: Well appearing man, laying supine in bed. Room air. Heart: RRR; no murmur Lungs: CTAB Abdomen: soft, non-tender Extremities: No LE edema Dialysis Access:  PD cath in mid L abdomen  Additional Objective Labs: Basic Metabolic Panel: Recent Labs  Lab 03/27/19 2337 03/28/19 0226 03/28/19 0544 03/29/19 0612  NA  --  138 138 143  K  --  5.1 5.8* 4.0  CL  --  100 97* 101  CO2  --  17* 23 24  GLUCOSE  --  72 76 154*  BUN  --  37* 37* 42*  CREATININE  --  21.98* 22.49* 21.95*  CALCIUM  --  8.5* 8.6* 9.1  PHOS 6.3*  --   --  6.8*   Liver Function Tests: Recent Labs  Lab 03/27/19 1645 03/28/19 0226 03/29/19 0612  AST 23 23  --   ALT 27 23  --   ALKPHOS 73 52  --   BILITOT 0.9 1.0  --   PROT 7.1 6.0*  --   ALBUMIN 2.9* 2.5* 2.7*   CBC: Recent Labs  Lab 03/27/19 1645 03/28/19 0544  WBC 7.1 5.2  NEUTROABS 4.7  --   HGB 8.5* 7.0*  HCT 24.8* 20.3*  MCV 112.7* 112.2*  PLT 117* 102*   Blood Culture    Component Value Date/Time   SDES PERITONEAL DIALYSATE 03/28/2019 1535   SPECREQUEST NONE 03/28/2019 1535   CULT  03/27/2019 1652    NO GROWTH < 24 HOURS Performed at [facility], [place] [address], [place], [place] [address]    REPTSTATUS 03/28/2019 FINAL 03/28/2019 1535   Studies/Results: Dg Chest Portable 1 View  Result  Date: 03/27/2019 CLINICAL DATA:  Pt arrives from home c/o hypotension after peritoneal dialysis today. Also episodes of diarrhea. EXAM: PORTABLE CHEST 1 VIEW COMPARISON:  03/14/2019 and multiple prior studies including 04/19/2017 FINDINGS: The heart size is normal. Slight prominence of the AP window region shows long-term stability. No focal consolidations or pleural effusions. No pulmonary edema. IMPRESSION: No active disease. Electronically Signed   By: Nolon Nations M.D.   On: 03/27/2019 16:44   Medications: . dialysis solution 1.5% low-MG/low-CA     . calcitRIOL  0.75 mcg Oral Daily  . darbepoetin (ARANESP) injection - NON-DIALYSIS  100 mcg Subcutaneous Q Sun-1800  . ferric citrate  210 mg Oral Q supper  . gentamicin cream  1 application Topical Daily  . latanoprost  1 drop Both Eyes QHS  . pantoprazole  20 mg Oral Daily  . predniSONE  5 mg Oral Q breakfast  . sodium bicarbonate  1,300 mg Oral TID  . sodium zirconium cyclosilicate  10 g Oral Daily  . tacrolimus  0.5 mg Oral Daily  . [START  ON 03/30/2019] valGANciclovir  450 mg Oral Q M,W,F    Dialysis Orders: CCPD, 5 exchanges x 2L volume, followed by 1L last fill - drain mid-day. 1:30 dwell time. No additives. Typically using 2.5% dextrose, but 1.5% lately d/t low BP.  Assessment/Plan: 1.  Orthostatic hypotension: Suspect volume depleted d/t ongoing diarrhea. Had received IVFs here and meds on hold. WBC stable. No signs of recurrent peritonitis. BP better today. 2.  ESRD on CCPD: Continue PD nightly with 1.5% dextrose (minimal UF) for now. Was slightly tender overlying Tx kidney on 9/5, afebrile - resolved now. 3.  Hyperkalemia: Odd considering ongoing diarrhea. Did get a dose of KCl  - d/c'd. S/p 1 dose Lokelma 9/5 - better. 4.  Diarrhea: Dark green coloration - GI panel and C.diff negative. CMV test pending. Possible combination between Auryxia (iron) and bile diarrhea. Agree that colonoscopy would be next step. He does have Hx  B-cell lymphoma > 10 years ago in the setting of EBV where he did have + Bx in sigmoid colon (treated, last EBV negative), also with new Dx prostate cancer - would want to make sure nothing suspicious in the abnormal colon area observed on CT 8/22. Full complicated Hx can be read in "care everywhere" from [facility] - sees oncology regularly. Will need f/u appt on discharge. 5.  Anemia: Hgb 7 - Aranesp 130mcg ordered for today. 6.  Metabolic bone disease: Ca ok, Phos high - continue Auryxia as binder. 7.  Thrombocytopenia: Follow - no heparin for now. 8. Dispo: Asking about d/c home today - Ok for discharge from renal standpoint if standing BP stable- would have him stay off BP meds and use low dextrose PD fluid now now - discussed with patient today. Definitely needs ongoing eval of diarrhea but could be done as OP.   ADDENDUM (1:58p): See that PD Fluid Cx flipped + for GPC. Abx per pharmacy. PD orders entered for tonight - will need to remain inpatient.  Veneta Penton, PA-C 03/29/2019, 9:30 AM  [facility] Pager: [phone]  Pt seen, examined and agree w A/P as above.  PD fluid cx today is + for GPC and cell count is borderline. Given he did have abd pain will start on AB Rx w/ IV vanc.   Kelly Splinter  MD 03/29/2019, 4:20 PM

## 2019-03-29 NOTE — Progress Notes (Signed)
   03/29/19 1040  Vitals  ECG Heart Rate (!) 160  Cardiac Rhythm ST  Ectopy PAC  Ectopy Frequency Frequent  MEWS Score  MEWS RR 0  MEWS Pulse 3  MEWS Systolic 0  MEWS LOC 0  MEWS Temp 0  MEWS Score 3  MEWS Score Color Yellow  MEWS Assessment  Is this an acute change? Yes  Provider Notification  Provider Name/Title Harbrecht/MD  Date Provider Notified 03/29/19  Time Provider Notified 1055  Notification Type Page  Notification Reason Change in status  Response See new orders  Date of Provider Response 03/29/19  Time of Provider Response 1100

## 2019-03-30 LAB — RENAL FUNCTION PANEL
Albumin: 2.6 g/dL — ABNORMAL LOW (ref 3.5–5.0)
Anion gap: 18 — ABNORMAL HIGH (ref 5–15)
BUN: 47 mg/dL — ABNORMAL HIGH (ref 6–20)
CO2: 27 mmol/L (ref 22–32)
Calcium: 8.7 mg/dL — ABNORMAL LOW (ref 8.9–10.3)
Chloride: 97 mmol/L — ABNORMAL LOW (ref 98–111)
Creatinine, Ser: 21.21 mg/dL — ABNORMAL HIGH (ref 0.61–1.24)
GFR calc Af Amer: 3 mL/min — ABNORMAL LOW (ref >60)
GFR calc non Af Amer: 2 mL/min — ABNORMAL LOW (ref >60)
Glucose, Bld: 96 mg/dL (ref 70–99)
Phosphorus: 7.1 mg/dL — ABNORMAL HIGH (ref 2.5–4.6)
Potassium: 3.8 mmol/L (ref 3.5–5.1)
Sodium: 142 mmol/L (ref 135–145)

## 2019-03-30 LAB — GLUCOSE, CAPILLARY: Glucose-Capillary: 116 mg/dL — ABNORMAL HIGH (ref 70–99)

## 2019-03-30 MED ORDER — SODIUM CHLORIDE 0.9 % IV SOLN
INTRAVENOUS | Status: DC
  Administered 2019-03-31: 09:00:00 via INTRAVENOUS

## 2019-03-30 NOTE — Care Management Important Message (Signed)
Important Message  Patient Details  Name: James [name] MRN: [id number] Date of Birth: Nov 14, 1970   Medicare Important Message Given:  Yes     Navayah Sok [address] Circle 03/30/2019, 3:39 PM

## 2019-03-30 NOTE — Progress Notes (Signed)
  Subjective:  Pt seen on rounds this AM. Pt says he is feeling better and diarrhea is improving. Says he had 3 bowel movements today and they're more solid than before.  Pt talked to GI and says they plan on doing a colonoscopy to w/u diarrhea. Starting prep today with plan to do colonoscopy tomorrow. Plan to leave Thursday. We told the patient we would loosen his fluid restrictions today.    Objective:    Vital Signs (last 24 hours): Vitals:   03/30/19 0448 03/30/19 0834 03/30/19 0915 03/30/19 0925  BP: (!) 139/42 (!) 146/71 (!) 157/61   Pulse: 96 68 89   Resp: 18 18 18    Temp: 98.7 F (37.1 C)  98.6 F (37 C)   TempSrc: Oral  Oral   SpO2: 100% 100% 100%   Weight:    75.8 kg  Height:        Physical Exam: General Alert and answers questions appropriately, no acute distress  Cardiac Regular rate and rhythm, no murmurs, rubs, or gallops  Pulmonary Clear to auscultation bilaterally without wheezes, rhonchi, or rales  Extremities No peripheral edema    Assessment/Plan:   Principal Problem:   Hypotension Active Problems:   ESRD on peritoneal dialysis (HCC)   Diarrhea   Renal transplant recipient  Patient is a 48 year old male with past medical history notable for ESRD and renal transplant now subsequently injected presents the emergency department for 3-week history of diarrhea.  Given chronic immunosuppression with prednisone and tacrolimus there is concern for EBV versus CMV colitis.  ESRD: Hypotension: Thought to be secondary to acute diarrhea and peritoneal dialysis.  Patient with improved blood pressure with systolics ranging from 119-147 overnight.  * Continue PD daily per nephrology * Peritoneal cultures reveal gram-positive cocci.  This may represent a skin contaminant as peritoneal fluid analysis is not consistent with infection.  Treating conservatively with vancomycin * We appreciate nephrology's continued recommendations for patient management  # Subacute  diarrhea: Currently unclear etiology, C. difficile was negative and GI PCR stool panel was negative.  Blood cultures negative at 72 hours.  There is concern for CMV colitis given chronic immunosuppression. * Tacrolimus level pending * GI consulted and is planning on colonoscopy tomorrow, prep today.  GI has low suspicion for CMV colitis.  However, he is also in need of a screening colonoscopy  Diet: Renal diet with fluid restriction DVT Ppx: SCDs Dispo: Anticipated discharge pending clinical improvement  Jeanmarie Hubert, MD 03/30/2019, 1:51 PM Pager: [phone]

## 2019-03-30 NOTE — Progress Notes (Signed)
Ironically, the patient indicates his diarrhea is better today, but for reasons stated in my note yesterday, we will proceed with colonoscopy tomorrow.  His prep will begin this afternoon.  Tentative plan is for an 8 AM procedure tomorrow by Dr. Watt Climes; the patient understands that it might not be me doing the procedure.  Cleotis Nipper, M.D. Pager [phone] If no answer or after 5 PM call [phone]

## 2019-03-30 NOTE — Progress Notes (Signed)
[facility] Progress Note   Dialysis Orders: CCPD, 5 exchanges x 2L volume, followed by 1L last fill - drain mid-day. 1:30 dwell time. No additives. Typically using 2.5% dextrose, but 1.5% lately d/t low BP.  Assessment/Plan: 1. Orthostatic hypotension: Suspect volume depleted d/t ongoing diarrhea. Had received IVFs here and meds on hold. WBC stable.  Resolved - no dizziness 2. ESRDon CCPD (Dr. Shara Blazing) : Continue PD nightly with 1.5% dextrose (minimal UF) for now. Was slightly tender overlying Tx kidney on 9/5, afebrile - resolved now. PD RN to disconnect soon. Cr still quite high 3. Gram + perotinitis on culture with relatively low cell count 49 9/5  - started on Vanc per pharmacy 9/6 Tells me he just finished two weeks of antibiotics for another positive PD infection- specifics not known. 4. Hyperkalemia: Odd considering ongoing diarrhea. Did get a dose of KCl  - d/c'd. S/p 1 dose Lokelma 9/5 per prior note but actually has been getting daily - will d/c K now 3.8 5. Diarrhea: Dark green coloration - GI panel and C.diff negative. CMV test pending. Possible combination between Auryxia (iron) and bile diarrhea. Agree that colonoscopy would be next step. 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 Full complicated Hx can be read in "care everywhere" from [facility] - sees oncology regularly. Will need f/u appt on discharge. GI to do colonosocpy 9/8 6. Anemia:Hgb 7 - Aranesp 162mcg q Sunday - first dose 9/5 - outpatient prior dose not known 7. Metabolic bone disease:Ca ok, Phos high - continue Auryxia as binder/calcitriol 8. Thrombocytopenia: Follow - no heparin for now. 102 K 9. Nutrition - CL for now 10. Disp - pending colonoscopy - possibly d/c tomorrow  -    Myriam Jacobson, PA-C [facility] Beeper [phone] 03/30/2019,8:51 AM  LOS: 3 days   Subjective:   Much better. Up walking without dizziness. BP improved.  Objective Vitals:   03/29/19 1819 03/29/19 2057 03/30/19 0448 03/30/19 0834  BP: (!) 126/56 (!) 142/77 (!) 139/42 (!) 146/71  Pulse: 100 97 96 68  Resp: 18 18 18 18   Temp: 98.4 F (36.9 C) 98.6 F (37 C) 98.7 F (37.1 C)   TempSrc:  Oral Oral   SpO2: 99% 100% 100% 100%  Weight:  75.3 kg    Height:       Physical Exam General: NAD well appearing Heart: RRR no murmur Lungs: no rales Abdomen: soft NT ND + BS Extremities: no LE edema Dialysis Access:  PD cath mid lower abd   Additional Objective Labs: Basic Metabolic Panel: Recent Labs  Lab 03/27/19 2337  03/28/19 0544 03/29/19 0612 03/30/19 0435  NA  --    < > 138 143 142  K  --    < > 5.8* 4.0 3.8  CL  --    < > 97* 101 97*  CO2  --    < > 23 24 27   GLUCOSE  --    < > 76 154* 96  BUN  --    < > 37* 42* 47*  CREATININE  --    < > 22.49* 21.95* 21.21*  CALCIUM  --    < > 8.6* 9.1 8.7*  PHOS 6.3*  --   --  6.8* 7.1*   < > = values in this interval not displayed.   Liver Function Tests: Recent Labs  Lab 03/27/19 1645 03/28/19 0226 03/29/19 0612 03/30/19 0435  AST 23 23  --   --   ALT 27 23  --   --   ALKPHOS 73 52  --   --   BILITOT 0.9 1.0  --   --   PROT 7.1 6.0*  --   --   ALBUMIN 2.9* 2.5* 2.7* 2.6*   No results for input(s): LIPASE, AMYLASE in the last 168 hours. CBC: Recent Labs  Lab 03/27/19 1645 03/28/19 0544  WBC 7.1 5.2  NEUTROABS 4.7  --   HGB 8.5* 7.0*  HCT 24.8* 20.3*  MCV 112.7* 112.2*  PLT 117* 102*   Blood Culture    Component Value Date/Time   SDES PERITONEAL DIALYSATE 03/28/2019 1535   SDES PERITONEAL DIALYSATE 03/28/2019 1535   SPECREQUEST NONE 03/28/2019 1535   SPECREQUEST NONE 03/28/2019 1535   CULT GRAM POSITIVE COCCI 03/28/2019 1535   REPTSTATUS 03/28/2019 FINAL 03/28/2019 1535    REPTSTATUS PENDING 03/28/2019 1535    Cardiac Enzymes: No results for input(s): CKTOTAL, CKMB, CKMBINDEX, TROPONINI in the last 168 hours. CBG: Recent Labs  Lab 03/27/19 1857  GLUCAP 84   Iron Studies: No results for input(s): IRON, TIBC, TRANSFERRIN, FERRITIN in the last 72 hours. Lab Results  Component Value Date   INR 1.0 03/14/2019   INR 1.07 04/24/2017   INR 1.03 04/19/2017   Studies/Results: No results found. Medications: . dialysis solution 1.5% low-MG/low-CA     . calcitRIOL  0.75 mcg Oral Daily  . darbepoetin (ARANESP) injection - NON-DIALYSIS  100 mcg Subcutaneous Q Sun-1800  . ferric citrate  210 mg Oral Q supper  . gentamicin cream  1 application Topical Daily  . latanoprost  1 drop Both Eyes QHS  . metoprolol tartrate  100 mg Oral BID  . pantoprazole  20 mg Oral Daily  . polyethylene glycol-electrolytes  4,000 mL Oral Once  . predniSONE  5 mg Oral Q breakfast  . sodium bicarbonate  1,300 mg Oral TID  . sodium zirconium cyclosilicate  10 g Oral Daily  . tacrolimus  0.5 mg Oral Daily  . valGANciclovir  450 mg Oral Q M,W,F  . vancomycin variable dose per unstable renal function (pharmacist dosing)   Does not apply See admin instructions

## 2019-03-31 ENCOUNTER — Inpatient Hospital Stay (HOSPITAL_COMMUNITY): Payer: Medicare Other | Admitting: Anesthesiology

## 2019-03-31 ENCOUNTER — Encounter (HOSPITAL_COMMUNITY): Payer: Self-pay | Admitting: Emergency Medicine

## 2019-03-31 ENCOUNTER — Encounter (HOSPITAL_COMMUNITY)
Admission: EM | Disposition: A | Discharge status: Home / Self Care | Payer: Self-pay | Source: Home / Self Care | Attending: Student in an Organized Health Care Education/Training Program

## 2019-03-31 DIAGNOSIS — D631 Anemia in chronic kidney disease: Secondary | ICD-10-CM

## 2019-03-31 DIAGNOSIS — Z9889 Other specified postprocedural states: Secondary | ICD-10-CM

## 2019-03-31 DIAGNOSIS — K635 Polyp of colon: Secondary | ICD-10-CM

## 2019-03-31 HISTORY — PX: POLYPECTOMY: SHX5525

## 2019-03-31 HISTORY — PX: COLONOSCOPY WITH PROPOFOL: SHX5780

## 2019-03-31 HISTORY — PX: BIOPSY: SHX5522

## 2019-03-31 LAB — PATHOLOGIST SMEAR REVIEW

## 2019-03-31 LAB — RENAL FUNCTION PANEL
Albumin: 2.6 g/dL — ABNORMAL LOW (ref 3.5–5.0)
Anion gap: 15 (ref 5–15)
BUN: 42 mg/dL — ABNORMAL HIGH (ref 6–20)
CO2: 28 mmol/L (ref 22–32)
Calcium: 8.4 mg/dL — ABNORMAL LOW (ref 8.9–10.3)
Chloride: 96 mmol/L — ABNORMAL LOW (ref 98–111)
Creatinine, Ser: 20.92 mg/dL — ABNORMAL HIGH (ref 0.61–1.24)
GFR calc Af Amer: 3 mL/min — ABNORMAL LOW (ref >60)
GFR calc non Af Amer: 2 mL/min — ABNORMAL LOW (ref >60)
Glucose, Bld: 79 mg/dL (ref 70–99)
Phosphorus: 8 mg/dL — ABNORMAL HIGH (ref 2.5–4.6)
Potassium: 3.6 mmol/L (ref 3.5–5.1)
Sodium: 139 mmol/L (ref 135–145)

## 2019-03-31 LAB — CULTURE, BODY FLUID W GRAM STAIN -BOTTLE

## 2019-03-31 LAB — CBC
HCT: 17.6 % — ABNORMAL LOW (ref 39.0–52.0)
Hemoglobin: 6.2 g/dL — CL (ref 13.0–17.0)
MCH: 38.8 pg — ABNORMAL HIGH (ref 26.0–34.0)
MCHC: 35.2 g/dL (ref 30.0–36.0)
MCV: 110 fL — ABNORMAL HIGH (ref 80.0–100.0)
Platelets: 111 10*3/uL — ABNORMAL LOW (ref 150–400)
RBC: 1.6 MIL/uL — ABNORMAL LOW (ref 4.22–5.81)
RDW: 13.5 % (ref 11.5–15.5)
WBC: 4.1 10*3/uL (ref 4.0–10.5)
nRBC: 0 % (ref 0.0–0.2)

## 2019-03-31 LAB — GLUCOSE, CAPILLARY: Glucose-Capillary: 74 mg/dL (ref 70–99)

## 2019-03-31 LAB — PREPARE RBC (CROSSMATCH)

## 2019-03-31 LAB — VANCOMYCIN, RANDOM: Vancomycin Rm: 51

## 2019-03-31 SURGERY — COLONOSCOPY WITH PROPOFOL
Anesthesia: Monitor Anesthesia Care

## 2019-03-31 MED ORDER — INSULIN ASPART 100 UNIT/ML ~~LOC~~ SOLN: 12.0000 [IU] | Freq: Once | SUBCUTANEOUS | Status: DC

## 2019-03-31 MED ORDER — SODIUM CHLORIDE 0.9 % IV SOLN: INTRAVENOUS | Status: DC

## 2019-03-31 MED ORDER — LIDOCAINE HCL (CARDIAC) PF 50 MG/5ML IV SOSY
PREFILLED_SYRINGE | INTRAVENOUS | Status: DC | PRN
  Administered 2019-03-31: 30 mg via INTRAVENOUS

## 2019-03-31 MED ORDER — SODIUM CHLORIDE 0.9% IV SOLUTION: Freq: Once | INTRAVENOUS | Status: DC

## 2019-03-31 MED ORDER — PROPOFOL 500 MG/50ML IV EMUL
INTRAVENOUS | Status: DC | PRN
  Administered 2019-03-31: 100 ug/kg/min via INTRAVENOUS

## 2019-03-31 MED ORDER — ACETAMINOPHEN 325 MG PO TABS
650.0000 mg | ORAL_TABLET | Freq: Four times a day (QID) | ORAL | Status: DC | PRN
  Administered 2019-03-31: 650 mg via ORAL
  Filled 2019-03-31: qty 2

## 2019-03-31 SURGICAL SUPPLY — 22 items

## 2019-03-31 NOTE — Anesthesia Postprocedure Evaluation (Signed)
Anesthesia Post Note  Patient: [name]  Procedure(s) Performed: COLONOSCOPY WITH PROPOFOL (N/A ) POLYPECTOMY BIOPSY     Patient location during evaluation: Endoscopy Anesthesia Type: MAC Level of consciousness: awake and alert Pain management: pain level controlled Vital Signs Assessment: post-procedure vital signs reviewed and stable Respiratory status: spontaneous breathing, nonlabored ventilation and respiratory function stable Cardiovascular status: stable and blood pressure returned to baseline Postop Assessment: no apparent nausea or vomiting Anesthetic complications: no    Last Vitals:  Vitals:   03/31/19 1013 03/31/19 1023  BP: (!) 157/58 (!) 171/39  Pulse: (!) 109 93  Resp: (!) 26 (!) 28  Temp: (!) 36 C   SpO2: 96% 99%    Last Pain:  Vitals:   03/31/19 1023  TempSrc:   PainSc: 0-No pain                 Lynda Rainwater

## 2019-03-31 NOTE — Progress Notes (Signed)
[facility] Progress Note   Dialysis Orders: CCPD, 5 exchanges x 2L volume, followed by 1L last fill - drain mid-day. 1:30 dwell time. No additives. Typically using 2.5% dextrose, but 1.5% lately d/t low BP.  Assessment/Plan: 1. Orthostatic hypotension: Suspect volume depleted d/t ongoing diarrhea. Had received IVFs here and meds on hold. WBC stable.  Resolved - no dizziness 2. ESRDon CCPD (Dr. Shara Blazing) : Continue PD nightly with 1.5% dextrose (minimal UF) for now.  3. Gram + perotinitis on culture with relatively low cell count 49 9/5  - started on Vanc per pharmacy 9/6; recently finished two weeks of antibiotics for another positive PD infection- specifics not known.  Vanc level this AM 51, no redosing needed.  Await speciation 4. Hyperkalemia: resolved 5. Diarrhea: GI to do colonosocpy 9/8 6. Anemia:Hgb < 7 - Aranesp 142mcg q Sunday - first dose 9/5 - outpatient prior dose not known 7. Metabolic bone disease:Ca ok, Phos high - continue Auryxia as binder/calcitriol 8. Thrombocytopenia: Follow - 102K 9. Disp - pending colonoscopy - possibly d/c tomorrow -  [facility] 03/31/2019,12:46 PM  LOS: 4 days   Subjective:   No c/o for CSY today; no issues with PD overnight. 375mL UF  Objective Vitals:   03/31/19 1013 03/31/19 1023 03/31/19 1056 03/31/19 1245  BP: (!) 157/58 (!) 171/39 (!) 160/70 (!) 139/42  Pulse: (!) 109 93 88 93  Resp: (!) 26 (!) 28 20 16   Temp: (!) 96.8 F (36 C)  97.7 F (36.5 C) 98.4 F (36.9 C)  TempSrc: Temporal  Oral Oral  SpO2: 96% 99% 97% 91%  Weight:      Height:       Physical Exam General: NAD well appearing Heart: RRR no murmur Lungs: no rales Abdomen: soft NT ND + BS Extremities: no LE edema Dialysis Access:  PD cath mid lower abd   Additional Objective Labs: Basic Metabolic Panel: Recent Labs  Lab 03/29/19 0612 03/30/19 0435 03/31/19 0612  NA 143 142 139  K 4.0 3.8 3.6  CL 101 97*  96*  CO2 24 27 28   GLUCOSE 154* 96 79  BUN 42* 47* 42*  CREATININE 21.95* 21.21* 20.92*  CALCIUM 9.1 8.7* 8.4*  PHOS 6.8* 7.1* 8.0*   Liver Function Tests: Recent Labs  Lab 03/27/19 1645 03/28/19 0226 03/29/19 0612 03/30/19 0435 03/31/19 0612  AST 23 23  --   --   --   ALT 27 23  --   --   --   ALKPHOS 73 52  --   --   --   BILITOT 0.9 1.0  --   --   --   PROT 7.1 6.0*  --   --   --   ALBUMIN 2.9* 2.5* 2.7* 2.6* 2.6*   No results for input(s): LIPASE, AMYLASE in the last 168 hours. CBC: Recent Labs  Lab 03/27/19 1645 03/28/19 0544 03/31/19 0612 03/31/19 0756  WBC 7.1 5.2 4.1 4.3  NEUTROABS 4.7  --   --   --   HGB 8.5* 7.0* 6.2* 6.5*  HCT 24.8* 20.3* 17.6* 18.2*  MCV 112.7* 112.2* 110.0* 107.7*  PLT 117* 102* 111* 104*   Blood Culture    Component Value Date/Time   SDES PERITONEAL DIALYSATE 03/28/2019 1535   SDES PERITONEAL DIALYSATE 03/28/2019 1535   SPECREQUEST NONE 03/28/2019 1535   SPECREQUEST NONE 03/28/2019 1535   CULT STAPHYLOCOCCUS HAEMOLYTICUS (A) 03/28/2019 1535   REPTSTATUS 03/28/2019 FINAL 03/28/2019 1535  REPTSTATUS 03/31/2019 FINAL 03/28/2019 1535    Cardiac Enzymes: No results for input(s): CKTOTAL, CKMB, CKMBINDEX, TROPONINI in the last 168 hours. CBG: Recent Labs  Lab 03/27/19 1857 03/30/19 2341 03/31/19 0844  GLUCAP 84 116* 74   Iron Studies: No results for input(s): IRON, TIBC, TRANSFERRIN, FERRITIN in the last 72 hours. Lab Results  Component Value Date   INR 1.0 03/14/2019   INR 1.07 04/24/2017   INR 1.03 04/19/2017   Studies/Results: No results found. Medications: . dialysis solution 1.5% low-MG/low-CA     . sodium chloride   Intravenous Once  . calcitRIOL  0.75 mcg Oral Daily  . darbepoetin (ARANESP) injection - NON-DIALYSIS  100 mcg Subcutaneous Q Sun-1800  . ferric citrate  210 mg Oral Q supper  . gentamicin cream  1 application Topical Daily  . latanoprost  1 drop Both Eyes QHS  . metoprolol tartrate  100 mg Oral  BID  . pantoprazole  20 mg Oral Daily  . predniSONE  5 mg Oral Q breakfast  . sodium bicarbonate  1,300 mg Oral TID  . tacrolimus  0.5 mg Oral Daily  . valGANciclovir  450 mg Oral Q M,W,F  . vancomycin variable dose per unstable renal function (pharmacist dosing)   Does not apply See admin instructions

## 2019-03-31 NOTE — Transfer of Care (Signed)
Immediate Anesthesia Transfer of Care Note  Patient: James [name]  Procedure(s) Performed: COLONOSCOPY WITH PROPOFOL (N/A ) POLYPECTOMY BIOPSY  Patient Location: PACU  Anesthesia Type:MAC  Level of Consciousness: awake  Airway & Oxygen Therapy: Patient Spontanous Breathing  Post-op Assessment: Report given to RN  Post vital signs: Reviewed and stable  Last Vitals:  Vitals Value Taken Time  BP 157/58 03/31/19 1013  Temp 36 C 03/31/19 1013  Pulse 109 03/31/19 1013  Resp 26 03/31/19 1013  SpO2 96 % 03/31/19 1013    Last Pain:  Vitals:   03/31/19 1013  TempSrc: Temporal  PainSc: 0-No pain         Complications: No apparent anesthesia complications

## 2019-03-31 NOTE — Plan of Care (Signed)
  Problem: Clinical Measurements: Goal: Respiratory complications will improve Outcome: Progressing   Problem: Coping: Goal: Level of anxiety will decrease Outcome: Progressing   

## 2019-03-31 NOTE — Progress Notes (Signed)
Pharmacy Antibiotic Note  James [name] is a 48 y.o. male admitted on 03/27/2019 with abdominal pain and possible peritonitis. Pharmacy has been consulted for vancomycin dosing. 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 Pt recently admitted for peritonitis and finished course of vancomycin and cefepime. GPCs growing within peritoneal fluid culture - identified as Staph haemolyticus.  9/6 received vancomycin 2000 mg IV x1 at 18:42 9/8 VR 51   Plan: Will check vancomycin level with am labs and redose once < 20 mcg/L F/U LOT   Height: 5\' 6"  (167.6 cm) Weight: 169 lb 1.5 oz (76.7 kg) IBW/kg (Calculated) : 63.8  Temp (24hrs), Avg:97.9 F (36.6 C), Min:96.8 F (36 C), Max:98.4 F (36.9 C)  Recent Labs  Lab 03/27/19 1645 03/27/19 2337 03/28/19 0226 03/28/19 0544 03/29/19 0612 03/30/19 0435 03/31/19 0612 03/31/19 0756  WBC 7.1  --   --  5.2  --   --  4.1 4.3  CREATININE 20.30*  --  21.98* 22.49* 21.95* 21.21* 20.92*  --   LATICACIDVEN 2.0* 2.1*  --  1.7  --   --   --   --   VANCORANDOM  --   --   --   --   --   --  51*  --     Estimated Creatinine Clearance: 4.3 mL/min (A) (by C-G formula based on SCr of 20.92 mg/dL (H)).    Allergies  Allergen Reactions  . Tramadol Hcl Shortness Of Breath and Other (See Comments)    Tramadol HCl = Shortness of breath and Tramadol = Rash  . Nsaids Other (See Comments)    Was told by MD to not take these  . Darvon [Propoxyphene] Rash and Other (See Comments)    Broke out on eyelid   . Tramadol Rash and Other (See Comments)    Tramadol = Rash and Tramadol HCl = Shortness of breath    Antimicrobials this admission: Vancomycin 9/6 >>  Microbiology results: 9/4 BCx: NGTD 9/4 C. Diff: negative 9/4 GI PCR: negative 9/5 UCx: >100k lactobacillus 9/5 Peritoneal Cx: Staph haemolyticus (R-oxacillin, S-vanc)   Thank you for involving pharmacy in this patient's  care.  Renold Genta, PharmD, BCPS Clinical Pharmacist Clinical phone for 03/31/2019 until 3p is [phone] 03/31/2019 1:19 PM  **Pharmacist phone directory can be found on [email or web address] listed under [place]**

## 2019-03-31 NOTE — Progress Notes (Signed)
  Subjective:  Patient seen at bedside this morning following colonoscopy.  Patient denies chest pain, shortness of breath, abdominal pain.  Patient is unable to assess bowel movements due to bowel prep yesterday. Patient is asking about going home.  Explained need to follow-up colonoscopy results and to monitor patient's anemia.  Patient understands this and is okay with staying in the hospital.   Objective:    Vital Signs (last 24 hours): Vitals:   03/31/19 0840 03/31/19 1013 03/31/19 1023 03/31/19 1056  BP: (!) 189/55 (!) 157/58 (!) 171/39 (!) 160/70  Pulse: 87 (!) 109 93 88  Resp: (!) 23 (!) 26 (!) 28 20  Temp: 98.3 F (36.8 C) (!) 96.8 F (36 C)  97.7 F (36.5 C)  TempSrc: Oral Temporal  Oral  SpO2: 100% 96% 99% 97%  Weight: 76.7 kg     Height: 5\' 6"  (1.676 m)       Physical Exam: General Alert and answers questions appropriately, no acute distress  Cardiac Regular rate and rhythm, no murmurs, rubs, or gallops  Abdominal Soft, non-tender, without distention  Extremities No peripheral edema    Assessment/Plan:   Principal Problem:   Hypotension Active Problems:   ESRD on peritoneal dialysis (HCC)   Diarrhea   Renal transplant recipient  Patient is a 48 year old male with past medical history notable for ESRD and renal transplant now subsequently presented to the emergency department for 3-week history of diarrhea.  Given chronic immunosuppression with prednisone tacrolimus there was concern for CMV colitis.  However there is no evidence on colonoscopy for this disease.  # ESRD: # Anemia: Hemoglobin of 6.2 this morning down from 7.0-day prior.  Chronic anemia in the setting of end-stage renal disease.  Renal started patient on Aranesp on Sunday, unclear patient was on this medication as an outpatient.  Patient was transfused 1 unit during colonoscopy today. * We will follow with CBC tomorrow a.m.  # Hypotension: Thought to be secondary to acute diarrhea on peritoneal  dialysis.  Patient is now without hypotension and has weight of 76.7 kg up from 75.3 kg 2 days ago, following increase in fluid intake.  # Subacute Diarrhea: Unclear etiology, C. difficile was negative and GI PCR stool panel was negative.  There was concern for CMV colitis given chronic immunosuppression however there is no evidence for this disease on colonoscopy, tacrolimus level pending. * Colonoscopy showed congested and erythematous mucosa in the sigmoid colon which was biopsied as well as a small polyp in the cecum.  The examination was otherwise without significant finding.  Diet: Soft diet, advance as tolerated DVT Ppx: SCDs Dispo: Anticipated discharge in 1 to 2 days.  Jeanmarie Hubert, MD 03/31/2019, 11:32 AM Pager: [phone]

## 2019-03-31 NOTE — Anesthesia Preprocedure Evaluation (Signed)
Anesthesia Evaluation  Patient identified by MRN, date of birth, ID band Patient awake    Reviewed: Allergy & Precautions, NPO status , Patient's Chart, lab work & pertinent test results  Airway Mallampati: II  TM Distance: >3 FB Neck ROM: Full    Dental  (+) Teeth Intact, Dental Advisory Given   Pulmonary former smoker,    breath sounds clear to auscultation       Cardiovascular hypertension, Pt. on medications  Rhythm:Regular Rate:Normal     Neuro/Psych    GI/Hepatic   Endo/Other  diabetes  Renal/GU ESRFRenal disease     Musculoskeletal   Abdominal   Peds  Hematology  (+) anemia ,   Anesthesia Other Findings   Reproductive/Obstetrics                             Anesthesia Physical  Anesthesia Plan  ASA: IV  Anesthesia Plan: MAC   Post-op Pain Management:    Induction: Intravenous  PONV Risk Score and Plan: 1 and Ondansetron and Treatment may vary due to age or medical condition  Airway Management Planned: Simple Face Mask  Additional Equipment:   Intra-op Plan:   Post-operative Plan:   Informed Consent: I have reviewed the patients History and Physical, chart, labs and discussed the procedure including the risks, benefits and alternatives for the proposed anesthesia with the patient or authorized representative who has indicated his/her understanding and acceptance.     Dental advisory given  Plan Discussed with: CRNA and Anesthesiologist  Anesthesia Plan Comments:         Anesthesia Quick Evaluation

## 2019-03-31 NOTE — Anesthesia Procedure Notes (Signed)
Procedure Name: MAC Date/Time: 03/24/2019 9:32 AM Performed by: Eligha Bridegroom, CRNA Pre-anesthesia Checklist: Patient identified, Emergency Drugs available, Suction available, Patient being monitored and Timeout performed Patient Re-evaluated:Patient Re-evaluated prior to induction Oxygen Delivery Method: Circle system utilized and Nasal cannula Preoxygenation: Pre-oxygenation with 100% oxygen Induction Type: IV induction

## 2019-03-31 NOTE — Progress Notes (Signed)
[place] 8:48 AM  Subjective: Patient without any specific complaints and his hospital computer chart reviewed and his case discussed with my partner Dr. Jacinto Reap  Objective: Vital signs stable afebrile exam please see preassessment evaluation labs and recent CT reviewed  Assessment: Diarrhea chronic anemia  Plan: Okay to proceed with colonoscopy with anesthesia assistance and he has okayed [place] to give him blood during the procedure  [facility]  office [phone] After 5PM or if no answer call [phone]

## 2019-03-31 NOTE — Plan of Care (Signed)
  Problem: Education: Goal: Knowledge of General Education information will improve Description: Including pain rating scale, medication(s)/side effects and non-pharmacologic comfort measures Outcome: Progressing   Problem: Clinical Measurements: Goal: Diagnostic test results will improve Outcome: Progressing   

## 2019-03-31 NOTE — Progress Notes (Signed)
CRITICAL VALUE ALERT  Critical Value:  hgb 6.2  Date & Time Notied:  03/31/2019 @ 4665  Provider Notified: Sallye Lat  Orders Received/Actions taken: Ordered for stat CBC.

## 2019-03-31 NOTE — Op Note (Signed)
[facility] Patient Name: James [name] Procedure Date : 03/31/2019 MRN: [id number] Attending MD: Clarene Essex , MD Date of Birth: 06/04/1971 CSN: [id number] Age: 48 Admit Type: Inpatient Procedure:                Colonoscopy Indications:              Clinically significant diarrhea of unexplained                            origin, Unexplained iron deficiency anemia Providers:                Clarene Essex, MD, Burtis Junes, RN, Lina Sar,                            Technician, Cherylynn Ridges, Technician, Eligha Bridegroom CRNA, CRNA Referring MD:              Medicines:                Propofol total dose 306 mg IV, 30 mg IV lidocaine Complications:            No immediate complications. Estimated Blood Loss:     Estimated blood loss: none. Procedure:                Pre-Anesthesia Assessment:                           - Prior to the procedure, a History and Physical                            was performed, and patient medications and                            allergies were reviewed. The patient's tolerance of                            previous anesthesia was also reviewed. The risks                            and benefits of the procedure and the sedation                            options and risks were discussed with the patient.                            All questions were answered, and informed consent                            was obtained. Prior Anticoagulants: The patient has                            taken no previous anticoagulant or antiplatelet  agents. ASA Grade Assessment: III - A patient with                            severe systemic disease. After reviewing the risks                            and benefits, the patient was deemed in                            satisfactory condition to undergo the procedure.                           After obtaining informed consent, the colonoscope     was passed under direct vision. Throughout the                            procedure, the patient's blood pressure, pulse, and                            oxygen saturations were monitored continuously. The                            CF-HQ190L ([id number]) Olympus colonoscope was                            introduced through the anus and advanced to the the                            terminal ileum. The terminal ileum, ileocecal                            valve, appendiceal orifice, and rectum were                            photographed. The colonoscopy was performed without                            difficulty. The patient tolerated the procedure                            well. The quality of the bowel preparation was                            adequate to identify polyps 6 mm and larger in size. Scope In: 9:32:10 AM Scope Out: 10:03:03 AM Scope Withdrawal Time: 0 hours 22 minutes 52 seconds  Total Procedure Duration: 0 hours 30 minutes 53 seconds  Findings:      A patchy area of mildly congested and erythematous mucosa was found in       the sigmoid colon. Biopsies were taken with a cold forceps for histology.      A small polyp was found in the cecum. The polyp was semi-sessile. The       polyp was removed with a cold snare. Resection was complete, and       retrieval was complete.  The descending colon, transverse colon, ascending colon and cecum       appeared normal. Biopsies for histology were taken with a cold forceps       from the ascending colon, transverse colon and descending colon for       evaluation of microscopic colitis.      The terminal ileum appeared normal.      The exam was otherwise without abnormality. Impression:               - Congested and erythematous mucosa in the sigmoid                            colon. Biopsied.                           - One small polyp in the cecum, removed with a cold                            snare. Resected and retrieved.                            - The descending colon, transverse colon, ascending                            colon and cecum are normal. Biopsied.                           - The examined portion of the ileum was normal.                           - The examination was otherwise normal. Moderate Sedation:      moderate sedation-none Recommendation:           - Soft diet today. Transfuse as needed                           - Continue present medications. Discharge and                            further work-up per hospital team                           - Return to GI clinic PRN.                           - Telephone GI clinic for pathology results in 1                            week.                           - Telephone GI clinic if symptomatic PRN. Procedure Code(s):        --- Professional ---                           [phone], Colonoscopy, flexible; with removal of  tumor(s), polyp(s), or other lesion(s) by snare                            technique                           45380, 59, Colonoscopy, flexible; with biopsy,                            single or multiple Diagnosis Code(s):        --- Professional ---                           K63.89, Other specified diseases of intestine                           K63.5, Polyp of colon                           R19.7, Diarrhea, unspecified                           D50.9, Iron deficiency anemia, unspecified CPT copyright 2019 [employer]. All rights reserved. The codes documented in this report are preliminary and upon coder review may  be revised to meet current compliance requirements. Clarene Essex, MD 03/31/2019 10:14:11 AM This report has been signed electronically. Number of Addenda: 0

## 2019-04-01 LAB — CBC
HCT: 18.2 % — ABNORMAL LOW (ref 39.0–52.0)
HCT: 21.4 % — ABNORMAL LOW (ref 39.0–52.0)
Hemoglobin: 6.5 g/dL — CL (ref 13.0–17.0)
Hemoglobin: 7.4 g/dL — ABNORMAL LOW (ref 13.0–17.0)
MCH: 38.5 pg — ABNORMAL HIGH (ref 26.0–34.0)
MCH: 35.9 pg — ABNORMAL HIGH (ref 26.0–34.0)
MCHC: 35.7 g/dL (ref 30.0–36.0)
MCHC: 34.6 g/dL (ref 30.0–36.0)
MCV: 107.7 fL — ABNORMAL HIGH (ref 80.0–100.0)
MCV: 103.9 fL — ABNORMAL HIGH (ref 80.0–100.0)
Platelets: 104 10*3/uL — ABNORMAL LOW (ref 150–400)
Platelets: 98 10*3/uL — ABNORMAL LOW (ref 150–400)
RBC: 1.69 MIL/uL — ABNORMAL LOW (ref 4.22–5.81)
RBC: 2.06 MIL/uL — ABNORMAL LOW (ref 4.22–5.81)
RDW: 13.2 % (ref 11.5–15.5)
RDW: 15.3 % (ref 11.5–15.5)
WBC: 4.3 10*3/uL (ref 4.0–10.5)
WBC: 4.6 10*3/uL (ref 4.0–10.5)
nRBC: 0 % (ref 0.0–0.2)
nRBC: 0 % (ref 0.0–0.2)

## 2019-04-01 LAB — CMV DNA, QUANTITATIVE, PCR
CMV DNA Quant: NEGATIVE IU/mL
Log10 CMV Qn DNA Pl: UNDETERMINED log10 IU/mL

## 2019-04-01 LAB — BPAM RBC
Blood Product Expiration Date: 202010122359
ISSUE DATE / TIME: 202009081253
Unit Type and Rh: 5100

## 2019-04-01 LAB — CULTURE, BLOOD (ROUTINE X 2)
Culture: NO GROWTH
Culture: NO GROWTH
Special Requests: ADEQUATE

## 2019-04-01 LAB — TYPE AND SCREEN
ABO/RH(D): O POS
Antibody Screen: NEGATIVE
Unit division: 0

## 2019-04-01 LAB — VANCOMYCIN, RANDOM: Vancomycin Rm: 44

## 2019-04-01 NOTE — Progress Notes (Signed)
[facility] Progress Note   Dialysis Orders: CCPD, 5 exchanges x 2L volume, followed by 1L last fill - drain mid-day. 1:30 dwell time. No additives. Typically using 2.5% dextrose, but 1.5% lately d/t low BP.  Assessment/Plan: 1. Orthostatic hypotension: Suspect volume depleted d/t ongoing diarrhea. Had received IVFs here and meds on hold. WBC stable.  Resolved - no dizziness 2. ESRDon CCPD (Dr. Shara Blazing) : Continue PD nightly with 1.5% dextrose (minimal UF) for now.  3. 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  Vanc level this AM 44, no redosing needed.  Duration of therapy per primary nephrologist as outpatient.  At least 3 weeks needed. 4. Hyperkalemia: resolved 5. Diarrhea: Stable, had colonosocpy 9/8 6. Anemia:Received 1 unit on 9/8, is on ESA, Aranesp 174mcg q Sunday - first dose 9/5 - outpatient prior dose not known 7. Metabolic bone disease:Ca ok, Phos high - continue Auryxia as binder/calcitriol 8. Thrombocytopenia: Follow -mild 9. Disp -no renal issues, okay with discharge.  He knows to contact his home therapies unit for further management of the Staphylococcus peritonitis  [place] 04/01/2019,1:01 PM  LOS: 5 days   Subjective:   No c/o for CSY today; no issues with PD overnight. 339mL UF  Objective Vitals:   03/31/19 2342 04/01/19 0458 04/01/19 0725 04/01/19 0954  BP: 134/66 (!) 142/74 (!) 148/42 (!) 132/116  Pulse: 90 93 90 87  Resp: 16 16 16 16   Temp: 98.2 F (36.8 C) 98.3 F (36.8 C) 98.5 F (36.9 C) 98.3 F (36.8 C)  TempSrc: Oral Oral Oral Oral  SpO2: 98% 98% 98% 100%  Weight: 77.1 kg  77.2 kg   Height:       Physical Exam General: NAD well appearing Heart: RRR no murmur Lungs: no rales Abdomen: soft NT ND +  BS Extremities: no LE edema Dialysis Access:  PD cath mid lower abd   Additional Objective Labs: Basic Metabolic Panel: Recent Labs  Lab 03/29/19 0612 03/30/19 0435 03/31/19 0612  NA 143 142 139  K 4.0 3.8 3.6  CL 101 97* 96*  CO2 24 27 28   GLUCOSE 154* 96 79  BUN 42* 47* 42*  CREATININE 21.95* 21.21* 20.92*  CALCIUM 9.1 8.7* 8.4*  PHOS 6.8* 7.1* 8.0*   Liver Function Tests: Recent Labs  Lab 03/27/19 1645 03/28/19 0226 03/29/19 0612 03/30/19 0435 03/31/19 0612  AST 23 23  --   --   --   ALT 27 23  --   --   --   ALKPHOS 73 52  --   --   --   BILITOT 0.9 1.0  --   --   --   PROT 7.1 6.0*  --   --   --   ALBUMIN 2.9* 2.5* 2.7* 2.6* 2.6*   No results for input(s): LIPASE, AMYLASE in the last 168 hours. CBC: Recent Labs  Lab 03/27/19 1645 03/28/19 0544 03/31/19 0612 03/31/19 0756 04/01/19 0657  WBC 7.1 5.2 4.1 4.3 4.6  NEUTROABS 4.7  --   --   --   --   HGB 8.5* 7.0* 6.2* 6.5* 7.4*  HCT 24.8* 20.3* 17.6* 18.2* 21.4*  MCV 112.7* 112.2* 110.0* 107.7* 103.9*  PLT 117* 102* 111* 104* 98*   Blood Culture    Component Value Date/Time  SDES PERITONEAL DIALYSATE 03/28/2019 1535   SDES PERITONEAL DIALYSATE 03/28/2019 1535   [place] 03/28/2019 1535   [place] 03/28/2019 1535   CULT STAPHYLOCOCCUS HAEMOLYTICUS (A) 03/28/2019 1535   REPTSTATUS 03/28/2019 FINAL 03/28/2019 1535   REPTSTATUS 03/31/2019 FINAL 03/28/2019 1535    Cardiac Enzymes: No results for input(s): CKTOTAL, CKMB, CKMBINDEX, TROPONINI in the last 168 hours. CBG: Recent Labs  Lab 03/27/19 1857 03/30/19 2341 03/31/19 0844  GLUCAP 84 116* 74   Iron Studies: No results for input(s): IRON, TIBC, TRANSFERRIN, FERRITIN in the last 72 hours. Lab Results  Component Value Date   INR 1.0 03/14/2019   INR 1.07 04/24/2017   INR 1.03 04/19/2017   Studies/Results: No results found. Medications: . dialysis solution 1.5% low-MG/low-CA     . sodium chloride   Intravenous Once  .  calcitRIOL  0.75 mcg Oral Daily  . darbepoetin (ARANESP) injection - NON-DIALYSIS  100 mcg Subcutaneous Q Sun-1800  . ferric citrate  210 mg Oral Q supper  . gentamicin cream  1 application Topical Daily  . latanoprost  1 drop Both Eyes QHS  . metoprolol tartrate  100 mg Oral BID  . pantoprazole  20 mg Oral Daily  . predniSONE  5 mg Oral Q breakfast  . sodium bicarbonate  1,300 mg Oral TID  . tacrolimus  0.5 mg Oral Daily  . valGANciclovir  450 mg Oral Q M,W,F  . vancomycin variable dose per unstable renal function (pharmacist dosing)   Does not apply See admin instructions

## 2019-04-01 NOTE — Discharge Instructions (Signed)
You were seen for diarrhea and low blood pressure. We did not see any evidence for CMV colitis on the colonoscopy. Your diarrhea has improved and was likely just a virus that is resolving. Given bacteria that was found in your peritoneal fluid, the nephrologist recommends that you be on 3 weeks of antibiotics as an outpatient. You received IV vancomycin while in the hospital and your dialysis center can give you antibiotics directly into your fluids.   For your blood pressure, given that your blood pressure was low, we want you to hold off on starting your amlodipine, lisinopril, and metoprolol until you see your nephrologist at your dialysis center.   You can ask your nephrologist about both your blood pressure medications and your antibiotics. We will be sending your nephrologist a summary document about your hospitalization.  Given that you had the bowel cleanout with the colonoscopy, it is possible that your lack of diarrhea is due to this and not improvement in the underlying cause. If your diarrhea starts worsening and does not improve, please call your doctor or return to the emergency room.   The pathologist will be calling you in the next week to tell you the results from your colonoscopy biopsies.

## 2019-04-01 NOTE — Progress Notes (Addendum)
DISCHARGE NOTE  James [name] to be discharged Home per MD order. Patient verbalized understanding.  Skin clean, dry and intact without evidence of skin break down, no evidence of skin tears noted. IV catheter discontinued intact. Site without signs and symptoms of complications. Dressing and pressure applied. Pt denies pain at the site currently. No complaints noted.  Patient has PD cath with dressing dry and intact.   Discharge packet assembled. An After Visit Summary (AVS) was printed and given to the patient at bedside. Patient escorted via wheelchair and discharged to home via private vehicle. All questions and concerns addressed.   Dolores Hoose, RN

## 2019-04-01 NOTE — Progress Notes (Signed)
Patient's biopsies from yesterday's colonoscopy are pending.  No major abnormalities seen on that exam.  Therefore, CMV colitis has been essentially excluded.  Spoke w/ medical resident, who states that pt's diarr has resolved and that pt is strongly desirous of going home.  I think that is fine, while awaiting bx's, altho I am not convinced we are "out of the woods" w/ respect to his diarr problem.  Cleotis Nipper, M.D. Pager [phone] If no answer or after 5 PM call [phone]

## 2019-04-01 NOTE — Progress Notes (Signed)
Pharmacy Antibiotic Note  James [name] is a 48 y.o. male admitted on 03/27/2019 with abdominal pain and possible peritonitis. Pharmacy has been consulted for vancomycin dosing. 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 Pt recently admitted for peritonitis and finished course of vancomycin and cefepime. GPCs growing within peritoneal fluid culture - identified as Staph haemolyticus.Patient received vancomycin 2000 mg IV x1 on 9/6 at 1842.   Vancomycin random level of 44 on 9/9 AM. Will not redose vancomycin at this time given vancomycin random is level is >20. WBC wnl. Afebrile.   Plan: Will check vancomycin random level with AM labs on 9/11 and redose vancomycin once level < 20  Follow up PD schedule  Monitor clinical progression    Height: 5\' 6"  (167.6 cm) Weight: 169 lb 15.6 oz (77.1 kg) IBW/kg (Calculated) : 63.8  Temp (24hrs), Avg:98 F (36.7 C), Min:96.8 F (36 C), Max:98.6 F (37 C)  Recent Labs  Lab 03/27/19 1645 03/27/19 2337 03/28/19 0226 03/28/19 0544 03/29/19 0612 03/30/19 0435 03/31/19 0612 03/31/19 0756 04/01/19 0657  WBC 7.1  --   --  5.2  --   --  4.1 4.3 4.6  CREATININE 20.30*  --  21.98* 22.49* 21.95* 21.21* 20.92*  --   --   LATICACIDVEN 2.0* 2.1*  --  1.7  --   --   --   --   --   VANCORANDOM  --   --   --   --   --   --  51*  --  44    Estimated Creatinine Clearance: 4.3 mL/min (A) (by C-G formula based on SCr of 20.92 mg/dL (H)).    Allergies  Allergen Reactions  . Tramadol Hcl Shortness Of Breath and Other (See Comments)    Tramadol HCl = Shortness of breath and Tramadol = Rash  . Nsaids Other (See Comments)    Was told by MD to not take these  . Darvon [Propoxyphene] Rash and Other (See Comments)    Broke out on eyelid   . Tramadol Rash and Other (See Comments)    Tramadol = Rash and Tramadol HCl = Shortness of breath    Antimicrobials this admission: Vancomycin 9/6  >>  Microbiology results: 9/4 BCx: NGTD 9/4 C. Diff: negative 9/4 GI PCR: negative 9/5 UCx: >100k lactobacillus 9/5 Peritoneal Cx: Staph haemolyticus (R-oxacillin, S-vanc)   Thank you for involving pharmacy in this patient's care.  Cristela Felt, PharmD PGY1 Pharmacy Resident [employer]: [phone]  04/01/2019 8:10 AM

## 2019-04-01 NOTE — Plan of Care (Signed)
  Problem: Health Behavior/Discharge Planning: Goal: Ability to manage health-related needs will improve Outcome: Progressing   Problem: Nutrition: Goal: Adequate nutrition will be maintained Outcome: Progressing   Problem: Elimination: Goal: Will not experience complications related to bowel motility Outcome: Progressing   

## 2019-04-01 NOTE — Plan of Care (Signed)
  Problem: Education: Goal: Knowledge of General Education information will improve Description: Including pain rating scale, medication(s)/side effects and non-pharmacologic comfort measures Outcome: Progressing   Problem: Coping: Goal: Level of anxiety will decrease Outcome: Progressing   

## 2019-04-01 NOTE — Progress Notes (Signed)
  Subjective:  Patient reports he feels well this morning.  Reports resolution of his diarrhea.  Denies abdominal pain, shortness of breath. Patient feels comfortable going home.   Objective:    Vital Signs (last 24 hours): Vitals:   03/31/19 2342 04/01/19 0458 04/01/19 0725 04/01/19 0954  BP: 134/66 (!) 142/74 (!) 148/42 (!) 132/116  Pulse: 90 93 90 87  Resp: 16 16 16 16   Temp: 98.2 F (36.8 C) 98.3 F (36.8 C) 98.5 F (36.9 C) 98.3 F (36.8 C)  TempSrc: Oral Oral Oral Oral  SpO2: 98% 98% 98% 100%  Weight: 77.1 kg  77.2 kg   Height:        Physical Exam: General Alert and answers questions appropriately, no acute distress  Cardiac Regular rate and rhythm, no murmurs, rubs, or gallops  Pulmonary Clear to auscultation bilaterally without wheezes, rhonchi, or rales    Assessment/Plan:   Principal Problem:   Hypotension Active Problems:   ESRD on peritoneal dialysis (HCC)   Diarrhea   Renal transplant recipient  Patient is a 48 year old male with past medical history notable for ESRD and renal transplant now subsequently presented emergency department for 3-week history of diarrhea.  Symptoms have gradually resolved and patient is now without diarrhea.  # ESRD: # Anemia: Hemoglobin with appropriate response, value of 7.4 up from 6.5 yesterday status post 1 unit PRBC.  # Hypotension: Patient initially hypotensive, thought to be secondary to acute diarrhea and peritoneal dialysis.  Patient is now without hypotension.  Will discharge patient off of home antihypertensive agents.  Patient instructed at follow-up appointment tomorrow to discuss reinitiation of antihypertensive therapy.  Patient had elevated diastolics on automatic blood pressure cuff today.  On manual check, patient had pressure 152/60  # Subacute diarrhea: Unclear etiology, now resolved.  C. difficile and GI PCR stool panel was negative.  There was initially concern for CMV colitis given chronic  immunosuppression however there is no evidence for this on colonoscopy.  Spoke with gastroenterology who has concerned that diarrhea may recur and temporary cessation may have occurred due to colonoscopy prep.  Patient informed of this and given strict return instructions.  Dispo: Discharge today.  Jeanmarie Hubert, MD 04/01/2019, 1:33 PM Pager: [phone]

## 2019-04-01 NOTE — Plan of Care (Signed)
  Problem: Education: Goal: Knowledge of General Education information will improve Description: Including pain rating scale, medication(s)/side effects and non-pharmacologic comfort measures Outcome: Adequate for Discharge   Problem: Health Behavior/Discharge Planning: Goal: Ability to manage health-related needs will improve 04/01/2019 1536 by Dolores Hoose, RN Outcome: Adequate for Discharge 04/01/2019 0741 by Dolores Hoose, RN Outcome: Progressing   Problem: Clinical Measurements: Goal: Ability to maintain clinical measurements within normal limits will improve Outcome: Adequate for Discharge Goal: Will remain free from infection Outcome: Adequate for Discharge Goal: Diagnostic test results will improve Outcome: Adequate for Discharge Goal: Respiratory complications will improve Outcome: Adequate for Discharge Goal: Cardiovascular complication will be avoided Outcome: Adequate for Discharge   Problem: Nutrition: Goal: Adequate nutrition will be maintained 04/01/2019 1536 by Dolores Hoose, RN Outcome: Adequate for Discharge 04/01/2019 0741 by Dolores Hoose, RN Outcome: Progressing   Problem: Coping: Goal: Level of anxiety will decrease Outcome: Adequate for Discharge   Problem: Elimination: Goal: Will not experience complications related to bowel motility 04/01/2019 1536 by Dolores Hoose, RN Outcome: Adequate for Discharge 04/01/2019 0741 by Dolores Hoose, RN Outcome: Progressing Goal: Will not experience complications related to urinary retention Outcome: Adequate for Discharge   Problem: Skin Integrity: Goal: Risk for impaired skin integrity will decrease Outcome: Adequate for Discharge

## 2019-04-01 NOTE — Discharge Summary (Signed)
Name: James [name] MRN: [id number] DOB: August 10, 1970 48 y.o. PCP: Patient, No Pcp Per  Date of Admission: 03/27/2019  3:35 PM Date of Discharge: 04/01/2019 Attending Physician: Dr. Evette Doffing  Discharge Diagnosis: 1. Hypotension 2. Subacute diarrhea 3. Bacterial peritonitis 4. ESRD  Discharge Medications: Allergies as of 04/01/2019      Reactions   Tramadol Hcl Shortness Of Breath, Other (See Comments)   Tramadol HCl = Shortness of breath and Tramadol = Rash   Nsaids Other (See Comments)   Was told by MD to not take these   Darvon [propoxyphene] Rash, Other (See Comments)   Broke out on eyelid    Tramadol Rash, Other (See Comments)   Tramadol = Rash and Tramadol HCl = Shortness of breath      Medication List    STOP taking these medications   amLODipine 5 MG tablet Commonly known as: NORVASC   lisinopril 10 MG tablet Commonly known as: ZESTRIL   metoprolol tartrate 100 MG tablet Commonly known as: LOPRESSOR   promethazine 25 MG tablet Commonly known as: PHENERGAN     TAKE these medications   acetaminophen 500 MG tablet Commonly known as: TYLENOL Take 500-1,000 mg by mouth every 6 (six) hours as needed (pain or headaches).   calcitRIOL 0.25 MCG capsule Commonly known as: ROCALTROL Take 0.75 mcg by mouth daily.   Cardura 4 MG tablet Generic drug: doxazosin Take 4 mg by mouth at bedtime.   Centrum Men Tabs Take 1 tablet by mouth 2 (two) times daily.   diphenhydrAMINE 25 mg capsule Commonly known as: BENADRYL Take 25 mg by mouth every 6 (six) hours as needed for itching.   ferric citrate 1 GM 210 MG(Fe) tablet Commonly known as: AURYXIA Take 210-420 mg by mouth See admin instructions. Take 210-420 mg by mouth one to three times a day with meals and 210 mg with each snack   ferrous sulfate 325 (65 FE) MG tablet Take 325 mg by mouth 2 (two) times daily with a meal.   insulin aspart 100 UNIT/ML FlexPen Commonly known as: NovoLOG FlexPen Inject 10 Units into  the skin 3 (three) times daily with meals. What changed:   how much to take  when to take this  additional instructions   Insulin Detemir 100 UNIT/ML Pen Commonly known as: Levemir FlexTouch Inject 15 Units into the skin at bedtime. What changed: when to take this   latanoprost 0.005 % ophthalmic solution Commonly known as: XALATAN Place 1 drop into both eyes at bedtime.   loperamide 2 MG capsule Commonly known as: IMODIUM Take 2 mg by mouth as needed for diarrhea or loose stools.   Melatonin 5 MG Caps Take 5 mg by mouth at bedtime.   omeprazole 20 MG capsule Commonly known as: PRILOSEC Take 20 mg by mouth daily.   potassium chloride SA 20 MEQ tablet Commonly known as: K-DUR Take 20 mEq by mouth every Monday, Wednesday, and Friday.   predniSONE 5 MG tablet Commonly known as: DELTASONE Take 5 mg by mouth daily with breakfast.   sodium bicarbonate 650 MG tablet Take 1,300 mg by mouth 3 (three) times daily.   tacrolimus 0.5 MG capsule Commonly known as: PROGRAF Take 0.5 mg by mouth daily.   valGANciclovir 450 MG tablet Commonly known as: VALCYTE Take 450 mg by mouth See admin instructions. Take one tablet (450 mg) by mouth on Monday, Wednesday, Friday before dialysis   zolpidem 10 MG tablet Commonly known as: AMBIEN Take 10 mg by  mouth at bedtime as needed for sleep.       Disposition and follow-up:   James [name] was discharged from [facility] in Stable condition.  At the hospital follow up visit please address:  1.  Please ensure patient's diarrhea has resolved. Please ensure patient completes antibiotic course for bacterial peritonitis - see hospital course below.  2.  Labs / imaging needed at time of follow-up: None  3.  Pending labs/ test needing follow-up: Tacrolimus level  Follow-up Appointments:   Hospital Course by problem list: # Hypotension: Patient presented to emergency department for hypotension and 3-week history  of diarrhea.  Patient was followed provided volume resuscitation with IV fluids.  Hypokalemia thought to be secondary to fluid losses from diarrhea and peritoneal dialysis. With remittal of diarrhea and decreased in PD UF, patient had improvement in blood pressure. Patient discharged with instructions not to take home blood pressure medications, and to followup with outpatient provider within week of discharge to consider re-initiation of anti-hypertensive therapy. Patient was on amlodipine 5 mg QD, lisinopril 10 mg QD, and metoprolol tartrate 100 mg QD prior to admission.  # Diarrhea: Patient presented with a 3-week history of nonbloody, watery, dark green diarrhea approximately 3-6 episodes per day.  There was initially concern for CMV colitis given that patient is on chronic immunosuppression with tacrolimus for history prior renal transplant.  With persistent diarrhea during hospitalization, GI was consulted.  Colonoscopy was performed and did not show any evidence for CMV colitis.  It demonstrated congested and erythematous mucosa in the sigmoid colon which was biopsied.  This along with a small polyp were sent for pathology.  Biopsy result did not show any evidence for CMV colitis or microscopic colitis.  Patient had improvement in his diarrhea.  Patient was counseled that improvement in diarrhea could be secondary to bowel cleanout from colonoscopy and diarrhea could again worsen.  Patient strongly desired for discharge and was discharged with strict return instructions to seek medical attention if diarrhea again worsened.  # ESRD: Patient was continued on peritoneal dialysis nightly with 1.5% dextrose with minimal ultrafiltration due to volume status.  # Staphylococcus hemolyticus peritonitis: Patient is on nightly peritoneal dialysis for end-stage renal disease.  Peritoneal fluid analysis had WBC of 49 on 9/5, but fluid culture revealed Staphylococcus hemolyticus.  BCID showed sensitivity to  vancomycin and patient was started on vancomycin therapy on 03/29/2019.  Patient will only require intermittent dosing due to renal function.  Per nephrology, patient to be treated with minimum 3 weeks of therapy per primary nephrologist as outpatient.  # Anemia: Patient had stable chronic anemia.  Patient's hemoglobin was 6.5 on 03/31/2019 and patient was transfused 1 unit PRBC with appropriate response.  Patient was asymptomatic, without signs of active bleeding, and hemodynamically stable.  Patient was started on Aranesp with dose given on Sunday, 03/29/2019.  Unclear if patient was previously on this as outpatient. On day of discharge, hemoglobin of 7.4.   Discharge Vitals:   BP (!) 152/60 (BP Location: Left Leg)   Pulse 87   Temp 98.3 F (36.8 C) (Oral)   Resp 16   Ht 5\' 6"  (1.676 m)   Wt 77.2 kg   SpO2 100%   BMI 27.47 kg/m   Pertinent Labs, Studies, and Procedures:  CBC Latest Ref Rng & Units 04/01/2019 03/31/2019 03/31/2019  WBC 4.0 - 10.5 K/uL 4.6 4.3 4.1  Hemoglobin 13.0 - 17.0 g/dL 7.4(L) 6.5(LL) 6.2(LL)  Hematocrit 39.0 - 52.0 %  21.4(L) 18.2(L) 17.6(L)  Platelets 150 - 400 K/uL 98(L) 104(L) 111(L)   BMP Latest Ref Rng & Units 03/31/2019 03/30/2019 03/29/2019  Glucose 70 - 99 mg/dL 79 96 154(H)  BUN 6 - 20 mg/dL 42(H) 47(H) 42(H)  Creatinine 0.61 - 1.24 mg/dL 20.92(H) 21.21(H) 21.95(H)  Sodium 135 - 145 mmol/L 139 142 143  Potassium 3.5 - 5.1 mmol/L 3.6 3.8 4.0  Chloride 98 - 111 mmol/L 96(L) 97(L) 101  CO2 22 - 32 mmol/L 28 27 24   Calcium 8.9 - 10.3 mg/dL 8.4(L) 8.7(L) 9.1   Vancomycin random (04/01/2019 @ 2820): 44  Colonoscopy (03/31/2019): - Congested and erythematous mucosa in the sigmoid colon. Biopsied. - One small polyp in the cecum, removed with a cold snare. Resected and retrieved. - The descending colon, transverse colon, ascending colon and cecum are normal. Biopsied. - The examined portion of the ileum was normal. - The examination was otherwise normal.  Colonoscopy  Pathology Report: 1. Colon, polyp(s), cecal - TUBULAR ADENOMA (X2 FRAGMENTS). - NO HIGH GRADE DYSPLASIA OR MALIGNANCY. 2. Colon, biopsy, ascending, transverse, descending, random - BENIGN COLONIC MUCOSA. - NO ACTIVE INFLAMMATION OR EVIDENCE OF MICROSCOPIC COLITIS. - NO DYSPLASIA OR MALIGNANCY. 3. Colon, biopsy, ? splenic flexure - TUBULAR ADENOMA. - NO HIGH GRADE DYSPLASIA OR MALIGNANCY. 4. Colon, biopsy, sigmoid random - TUBULAR ADENOMA. - BENIGN COLONIC MUCOSA FRAGMENTS. - NO ACTIVE INFLAMMATION OR MICROSCOPIC COLITIS. - NO HIGH GRADE DYSPLASIA OR MALIGNANCY.  CMV DNA, quantitative PCR (03/28/2019): Negative TSH (03/28/2019): 0.902 C Diff (03/27/2019): Negative   Discharge Instructions: Discharge Instructions    Call MD for:  difficulty breathing, headache or visual disturbances   Complete by: As directed    Call MD for:  persistant nausea and vomiting   Complete by: As directed    Call MD for:  redness, tenderness, or signs of infection (pain, swelling, redness, odor or green/yellow discharge around incision site)   Complete by: As directed    Call MD for:  temperature >100.4   Complete by: As directed    Diet - low sodium heart healthy   Complete by: As directed    Increase activity slowly   Complete by: As directed       Signed: Jeanmarie Hubert, MD 04/02/2019, 8:44 PM   Pager: [phone]

## 2019-04-02 ENCOUNTER — Encounter (HOSPITAL_COMMUNITY): Payer: Self-pay | Admitting: Gastroenterology

## 2019-04-03 LAB — TACROLIMUS LEVEL: Tacrolimus (FK506) - LabCorp: <1 ng/mL — ABNORMAL LOW (ref 2.0–20.0)

## 2019-06-04 ENCOUNTER — Other Ambulatory Visit: Payer: Self-pay

## 2019-06-04 ENCOUNTER — Emergency Department (HOSPITAL_COMMUNITY)
Admission: EM | Admit: 2019-06-04 | Discharge: 2019-06-04 | Discharge status: Against Medical Advice | Payer: Medicare Other | Attending: Emergency Medicine | Admitting: Emergency Medicine

## 2019-06-04 ENCOUNTER — Encounter (HOSPITAL_COMMUNITY): Payer: Self-pay | Admitting: Emergency Medicine

## 2019-06-04 DIAGNOSIS — R3989 Other symptoms and signs involving the genitourinary system: Secondary | ICD-10-CM | POA: Diagnosis present

## 2019-06-04 DIAGNOSIS — Z5321 Procedure and treatment not carried out due to patient leaving prior to being seen by health care provider: Secondary | ICD-10-CM | POA: Insufficient documentation

## 2019-06-04 LAB — CBC
HCT: 24.4 % — ABNORMAL LOW (ref 39.0–52.0)
Hemoglobin: 8.1 g/dL — ABNORMAL LOW (ref 13.0–17.0)
MCH: 33.8 pg (ref 26.0–34.0)
MCHC: 33.2 g/dL (ref 30.0–36.0)
MCV: 101.7 fL — ABNORMAL HIGH (ref 80.0–100.0)
Platelets: 125 10*3/uL — ABNORMAL LOW (ref 150–400)
RBC: 2.4 MIL/uL — ABNORMAL LOW (ref 4.22–5.81)
RDW: 19.6 % — ABNORMAL HIGH (ref 11.5–15.5)
WBC: 5.5 10*3/uL (ref 4.0–10.5)
nRBC: 0 % (ref 0.0–0.2)

## 2019-06-04 LAB — BASIC METABOLIC PANEL
Anion gap: 14 (ref 5–15)
BUN: 45 mg/dL — ABNORMAL HIGH (ref 6–20)
CO2: 24 mmol/L (ref 22–32)
Calcium: 8.1 mg/dL — ABNORMAL LOW (ref 8.9–10.3)
Chloride: 99 mmol/L (ref 98–111)
Creatinine, Ser: 16.41 mg/dL — ABNORMAL HIGH (ref 0.61–1.24)
GFR calc Af Amer: 4 mL/min — ABNORMAL LOW (ref >60)
GFR calc non Af Amer: 3 mL/min — ABNORMAL LOW (ref >60)
Glucose, Bld: 84 mg/dL (ref 70–99)
Potassium: 4.6 mmol/L (ref 3.5–5.1)
Sodium: 137 mmol/L (ref 135–145)

## 2019-06-04 NOTE — ED Notes (Signed)
Patient called from lobby for vital check with no response.

## 2019-06-04 NOTE — ED Notes (Signed)
No answer when called for vitals check x2.

## 2019-06-04 NOTE — ED Notes (Signed)
No answer when called for vitals. 

## 2019-06-04 NOTE — ED Triage Notes (Signed)
Pt arrives via [facility] from home with reports of bladder pain since Monday. Reports having catheter placed Nov 6th. 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 Last HD last night at home

## 2019-07-24 DIAGNOSIS — D735 Infarction of spleen: Secondary | ICD-10-CM

## 2019-07-24 HISTORY — DX: Infarction of spleen: D73.5

## 2019-10-28 ENCOUNTER — Other Ambulatory Visit: Payer: Self-pay

## 2019-10-28 ENCOUNTER — Emergency Department (HOSPITAL_COMMUNITY): Payer: Medicare Other

## 2019-10-28 ENCOUNTER — Encounter (HOSPITAL_COMMUNITY): Payer: Self-pay | Admitting: Emergency Medicine

## 2019-10-28 ENCOUNTER — Inpatient Hospital Stay (HOSPITAL_COMMUNITY)
Admission: EM | Admit: 2019-10-28 | Discharge: 2019-11-04 | DRG: 907 | Disposition: A | Discharge status: Home / Self Care | Payer: Medicare Other | Attending: Internal Medicine | Admitting: Internal Medicine

## 2019-10-28 DIAGNOSIS — E1122 Type 2 diabetes mellitus with diabetic chronic kidney disease: Secondary | ICD-10-CM | POA: Diagnosis present

## 2019-10-28 DIAGNOSIS — A084 Viral intestinal infection, unspecified: Secondary | ICD-10-CM | POA: Diagnosis present

## 2019-10-28 DIAGNOSIS — Z888 Allergy status to other drugs, medicaments and biological substances status: Secondary | ICD-10-CM | POA: Diagnosis not present

## 2019-10-28 DIAGNOSIS — Z8572 Personal history of non-Hodgkin lymphomas: Secondary | ICD-10-CM | POA: Diagnosis not present

## 2019-10-28 DIAGNOSIS — A419 Sepsis, unspecified organism: Secondary | ICD-10-CM | POA: Diagnosis present

## 2019-10-28 DIAGNOSIS — Y838 Other surgical procedures as the cause of abnormal reaction of the patient, or of later complication, without mention of misadventure at the time of the procedure: Secondary | ICD-10-CM | POA: Diagnosis present

## 2019-10-28 DIAGNOSIS — Z20822 Contact with and (suspected) exposure to covid-19: Secondary | ICD-10-CM | POA: Diagnosis present

## 2019-10-28 DIAGNOSIS — K652 Spontaneous bacterial peritonitis: Secondary | ICD-10-CM | POA: Diagnosis present

## 2019-10-28 DIAGNOSIS — Z87891 Personal history of nicotine dependence: Secondary | ICD-10-CM | POA: Diagnosis not present

## 2019-10-28 DIAGNOSIS — Z9049 Acquired absence of other specified parts of digestive tract: Secondary | ICD-10-CM | POA: Diagnosis not present

## 2019-10-28 DIAGNOSIS — E8889 Other specified metabolic disorders: Secondary | ICD-10-CM | POA: Diagnosis present

## 2019-10-28 DIAGNOSIS — N186 End stage renal disease: Secondary | ICD-10-CM | POA: Diagnosis present

## 2019-10-28 DIAGNOSIS — Z79890 Hormone replacement therapy (postmenopausal): Secondary | ICD-10-CM | POA: Diagnosis not present

## 2019-10-28 DIAGNOSIS — D62 Acute posthemorrhagic anemia: Secondary | ICD-10-CM | POA: Diagnosis present

## 2019-10-28 DIAGNOSIS — E1152 Type 2 diabetes mellitus with diabetic peripheral angiopathy with gangrene: Secondary | ICD-10-CM | POA: Diagnosis present

## 2019-10-28 DIAGNOSIS — E119 Type 2 diabetes mellitus without complications: Secondary | ICD-10-CM | POA: Diagnosis not present

## 2019-10-28 DIAGNOSIS — T8612 Kidney transplant failure: Secondary | ICD-10-CM | POA: Diagnosis present

## 2019-10-28 DIAGNOSIS — D631 Anemia in chronic kidney disease: Secondary | ICD-10-CM | POA: Diagnosis present

## 2019-10-28 DIAGNOSIS — Z992 Dependence on renal dialysis: Secondary | ICD-10-CM

## 2019-10-28 DIAGNOSIS — N185 Chronic kidney disease, stage 5: Secondary | ICD-10-CM | POA: Diagnosis not present

## 2019-10-28 DIAGNOSIS — R590 Localized enlarged lymph nodes: Secondary | ICD-10-CM | POA: Diagnosis present

## 2019-10-28 DIAGNOSIS — Z885 Allergy status to narcotic agent status: Secondary | ICD-10-CM

## 2019-10-28 DIAGNOSIS — R1084 Generalized abdominal pain: Secondary | ICD-10-CM

## 2019-10-28 DIAGNOSIS — D61818 Other pancytopenia: Secondary | ICD-10-CM | POA: Diagnosis present

## 2019-10-28 DIAGNOSIS — R188 Other ascites: Secondary | ICD-10-CM | POA: Diagnosis present

## 2019-10-28 DIAGNOSIS — Z794 Long term (current) use of insulin: Secondary | ICD-10-CM | POA: Diagnosis not present

## 2019-10-28 DIAGNOSIS — I10 Essential (primary) hypertension: Secondary | ICD-10-CM | POA: Diagnosis present

## 2019-10-28 DIAGNOSIS — Z940 Kidney transplant status: Secondary | ICD-10-CM | POA: Diagnosis not present

## 2019-10-28 DIAGNOSIS — T8571XA Infection and inflammatory reaction due to peritoneal dialysis catheter, initial encounter: Principal | ICD-10-CM | POA: Diagnosis present

## 2019-10-28 DIAGNOSIS — Z8546 Personal history of malignant neoplasm of prostate: Secondary | ICD-10-CM

## 2019-10-28 DIAGNOSIS — E11649 Type 2 diabetes mellitus with hypoglycemia without coma: Secondary | ICD-10-CM | POA: Diagnosis not present

## 2019-10-28 DIAGNOSIS — I120 Hypertensive chronic kidney disease with stage 5 chronic kidney disease or end stage renal disease: Secondary | ICD-10-CM | POA: Diagnosis present

## 2019-10-28 DIAGNOSIS — A0472 Enterocolitis due to Clostridium difficile, not specified as recurrent: Secondary | ICD-10-CM | POA: Diagnosis present

## 2019-10-28 DIAGNOSIS — Z79899 Other long term (current) drug therapy: Secondary | ICD-10-CM | POA: Diagnosis not present

## 2019-10-28 DIAGNOSIS — B965 Pseudomonas (aeruginosa) (mallei) (pseudomallei) as the cause of diseases classified elsewhere: Secondary | ICD-10-CM | POA: Diagnosis present

## 2019-10-28 DIAGNOSIS — Z452 Encounter for adjustment and management of vascular access device: Secondary | ICD-10-CM

## 2019-10-28 DIAGNOSIS — Z89511 Acquired absence of right leg below knee: Secondary | ICD-10-CM

## 2019-10-28 DIAGNOSIS — E876 Hypokalemia: Secondary | ICD-10-CM | POA: Diagnosis present

## 2019-10-28 DIAGNOSIS — E785 Hyperlipidemia, unspecified: Secondary | ICD-10-CM | POA: Diagnosis present

## 2019-10-28 LAB — CBC
HCT: 23.3 % — ABNORMAL LOW (ref 39.0–52.0)
Hemoglobin: 7.7 g/dL — ABNORMAL LOW (ref 13.0–17.0)
MCH: 36.7 pg — ABNORMAL HIGH (ref 26.0–34.0)
MCHC: 33 g/dL (ref 30.0–36.0)
MCV: 111 fL — ABNORMAL HIGH (ref 80.0–100.0)
Platelets: 150 10*3/uL (ref 150–400)
RBC: 2.1 MIL/uL — ABNORMAL LOW (ref 4.22–5.81)
RDW: 15.8 % — ABNORMAL HIGH (ref 11.5–15.5)
WBC: 2.8 10*3/uL — ABNORMAL LOW (ref 4.0–10.5)
nRBC: 0 % (ref 0.0–0.2)

## 2019-10-28 LAB — CREATININE, SERUM
Creatinine, Ser: 12.94 mg/dL — ABNORMAL HIGH (ref 0.61–1.24)
GFR calc Af Amer: 5 mL/min — ABNORMAL LOW (ref >60)
GFR calc non Af Amer: 4 mL/min — ABNORMAL LOW (ref >60)

## 2019-10-28 LAB — COMPREHENSIVE METABOLIC PANEL
ALT: 20 U/L (ref 0–44)
AST: 24 U/L (ref 15–41)
Albumin: 2.5 g/dL — ABNORMAL LOW (ref 3.5–5.0)
Alkaline Phosphatase: 89 U/L (ref 38–126)
Anion gap: 15 (ref 5–15)
BUN: 31 mg/dL — ABNORMAL HIGH (ref 6–20)
CO2: 25 mmol/L (ref 22–32)
Calcium: 8.8 mg/dL — ABNORMAL LOW (ref 8.9–10.3)
Chloride: 97 mmol/L — ABNORMAL LOW (ref 98–111)
Creatinine, Ser: 13.42 mg/dL — ABNORMAL HIGH (ref 0.61–1.24)
GFR calc Af Amer: 5 mL/min — ABNORMAL LOW (ref >60)
GFR calc non Af Amer: 4 mL/min — ABNORMAL LOW (ref >60)
Glucose, Bld: 57 mg/dL — ABNORMAL LOW (ref 70–99)
Potassium: 3.6 mmol/L (ref 3.5–5.1)
Sodium: 137 mmol/L (ref 135–145)
Total Bilirubin: 0.5 mg/dL (ref 0.3–1.2)
Total Protein: 7.1 g/dL (ref 6.5–8.1)

## 2019-10-28 LAB — LIPASE, BLOOD: Lipase: 24 U/L (ref 11–51)

## 2019-10-28 LAB — SARS CORONAVIRUS 2 (TAT 6-24 HRS): SARS Coronavirus 2: NEGATIVE

## 2019-10-28 LAB — LACTIC ACID, PLASMA: Lactic Acid, Venous: 1.5 mmol/L (ref 0.5–1.9)

## 2019-10-28 LAB — CBG MONITORING, ED: Glucose-Capillary: 68 mg/dL — ABNORMAL LOW (ref 70–99)

## 2019-10-28 MED ORDER — SORBITOL 70 % SOLN
30.0000 mL | Status: DC | PRN
  Filled 2019-10-28: qty 30

## 2019-10-28 MED ORDER — VANCOMYCIN HCL 10 G IV SOLR
1500.0000 mg | Freq: Once | INTRAVENOUS | Status: DC
  Filled 2019-10-28: qty 1500

## 2019-10-28 MED ORDER — ONDANSETRON HCL 4 MG PO TABS
4.0000 mg | ORAL_TABLET | Freq: Four times a day (QID) | ORAL | Status: DC | PRN
  Administered 2019-11-01: 4 mg via ORAL
  Filled 2019-10-28: qty 1

## 2019-10-28 MED ORDER — ONDANSETRON HCL 4 MG/2ML IJ SOLN
4.0000 mg | Freq: Four times a day (QID) | INTRAMUSCULAR | Status: DC | PRN
  Administered 2019-10-29 – 2019-11-03 (×7): 4 mg via INTRAVENOUS
  Filled 2019-10-28 (×6): qty 2

## 2019-10-28 MED ORDER — DELFLEX-LC/1.5% DEXTROSE 344 MOSM/L IP SOLN: INTRAPERITONEAL | Status: DC

## 2019-10-28 MED ORDER — ACETAMINOPHEN 650 MG RE SUPP: 650.0000 mg | Freq: Four times a day (QID) | RECTAL | Status: DC | PRN

## 2019-10-28 MED ORDER — GENTAMICIN SULFATE 0.1 % EX CREA
1.0000 "application " | TOPICAL_CREAM | Freq: Every day | CUTANEOUS | Status: DC
  Administered 2019-10-28 – 2019-11-01 (×3): 1 via TOPICAL
  Filled 2019-10-28: qty 15

## 2019-10-28 MED ORDER — NEPRO/CARBSTEADY PO LIQD
237.0000 mL | Freq: Three times a day (TID) | ORAL | Status: DC | PRN
  Filled 2019-10-28: qty 237

## 2019-10-28 MED ORDER — HYDROXYZINE HCL 25 MG PO TABS: 25.0000 mg | ORAL_TABLET | Freq: Three times a day (TID) | ORAL | Status: DC | PRN

## 2019-10-28 MED ORDER — INSULIN ASPART 100 UNIT/ML ~~LOC~~ SOLN: 0.0000 [IU] | Freq: Three times a day (TID) | SUBCUTANEOUS | Status: DC

## 2019-10-28 MED ORDER — CALCIUM CARBONATE ANTACID 1250 MG/5ML PO SUSP
500.0000 mg | Freq: Four times a day (QID) | ORAL | Status: DC | PRN
  Filled 2019-10-28: qty 5

## 2019-10-28 MED ORDER — HEPARIN SODIUM (PORCINE) 5000 UNIT/ML IJ SOLN
5000.0000 [IU] | Freq: Three times a day (TID) | INTRAMUSCULAR | Status: DC
  Administered 2019-10-28: 5000 [IU] via SUBCUTANEOUS
  Filled 2019-10-28: qty 1

## 2019-10-28 MED ORDER — INSULIN ASPART 100 UNIT/ML ~~LOC~~ SOLN: 0.0000 [IU] | Freq: Every day | SUBCUTANEOUS | Status: DC

## 2019-10-28 MED ORDER — SODIUM CHLORIDE 0.9 % IV SOLN
1.0000 g | INTRAVENOUS | Status: DC
  Administered 2019-10-28: 1 g via INTRAVENOUS
  Filled 2019-10-28: qty 1

## 2019-10-28 MED ORDER — ONDANSETRON 4 MG PO TBDP
4.0000 mg | ORAL_TABLET | Freq: Once | ORAL | Status: AC
  Administered 2019-10-28: 16:00:00 4 mg via ORAL
  Filled 2019-10-28: qty 1

## 2019-10-28 MED ORDER — VANCOMYCIN HCL 1500 MG/300ML IV SOLN
1500.0000 mg | Freq: Once | INTRAVENOUS | Status: AC
  Administered 2019-10-28: 1500 mg via INTRAVENOUS
  Filled 2019-10-28: qty 300

## 2019-10-28 MED ORDER — CAMPHOR-MENTHOL 0.5-0.5 % EX LOTN
1.0000 "application " | TOPICAL_LOTION | Freq: Three times a day (TID) | CUTANEOUS | Status: DC | PRN
  Filled 2019-10-28: qty 222

## 2019-10-28 MED ORDER — ZOLPIDEM TARTRATE 5 MG PO TABS
5.0000 mg | ORAL_TABLET | Freq: Every evening | ORAL | Status: DC | PRN
  Administered 2019-10-31 – 2019-11-03 (×3): 5 mg via ORAL
  Filled 2019-10-28 (×4): qty 1

## 2019-10-28 MED ORDER — HEPARIN 1000 UNIT/ML FOR PERITONEAL DIALYSIS
INTRAPERITONEAL | Status: DC | PRN
  Filled 2019-10-28: qty 5000

## 2019-10-28 MED ORDER — HEPARIN 1000 UNIT/ML FOR PERITONEAL DIALYSIS: 500.0000 [IU] | INTRAMUSCULAR | Status: DC | PRN

## 2019-10-28 MED ORDER — ACETAMINOPHEN 500 MG PO TABS
500.0000 mg | ORAL_TABLET | Freq: Once | ORAL | Status: AC
  Administered 2019-10-28: 500 mg via ORAL
  Filled 2019-10-28: qty 1

## 2019-10-28 MED ORDER — VANCOMYCIN VARIABLE DOSE PER UNSTABLE RENAL FUNCTION (PHARMACIST DOSING): Status: DC

## 2019-10-28 MED ORDER — DOCUSATE SODIUM 283 MG RE ENEM
1.0000 | ENEMA | RECTAL | Status: DC | PRN
  Filled 2019-10-28: qty 1

## 2019-10-28 MED ORDER — ACETAMINOPHEN 325 MG PO TABS
650.0000 mg | ORAL_TABLET | Freq: Four times a day (QID) | ORAL | Status: DC | PRN
  Administered 2019-10-28 – 2019-10-30 (×4): 650 mg via ORAL
  Filled 2019-10-28 (×5): qty 2

## 2019-10-28 NOTE — ED Notes (Signed)
Spoke with Dialysis nurse she is coming to bring supplies

## 2019-10-28 NOTE — ED Provider Notes (Signed)
[facility] EMERGENCY DEPARTMENT Provider Note   CSN: [id number] Arrival date & time: 10/28/19  8127     History Chief Complaint  Patient presents with  . Abdominal Pain    James [name] is a 49 y.o. 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  Patient reports that he receives hemodialysis at home on a daily basis.  During his hemodialysis this morning, he had to stop due to significant 7 of 10 abdominal pain.  He also has been experiencing loose stools, approximately 8 nonbloody, nonmelanotic stools in the past 12 hours.  He also endorses low-grade fevers of 99.5 F during this period of time.  He denies any headache or dizziness, chest pain or difficulty breathing, cough, urinary symptoms, melena, hematochezia, or other symptoms.  He states that he had a gangrene infection in January, but he has been receiving physical therapy regularly and has been doing well.  HPI     Past Medical History:  Diagnosis Date  . Anemia   . Diabetes mellitus without complication ([place])   . Hypertension   . Influenza A   . Pneumonia 04/21/2017  . Renal disorder   . 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 He then got HD in [facility] until getting a renal transplant in 2009 at [place], [place].  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    . Smoker   . Tachycardia   . Transplant recipient     Patient Active Problem List   Diagnosis Date Noted  . 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 Hypotension 03/27/2019  . Anemia   . Renal transplant disorder   . Diarrhea 08/13/2015  .  Tachycardia 04/09/2014  . ESRD on peritoneal dialysis ([place])   . Diabetes mellitus without complication ([place])   . Hypertension     Past Surgical History:  Procedure Laterality Date  . BIOPSY  03/31/2019   Procedure: BIOPSY;  Surgeon: Clarene Essex, MD;  Location: [place];  Service: Endoscopy;;  . CHOLECYSTECTOMY    . COLONOSCOPY WITH PROPOFOL N/A 03/31/2019   Procedure: COLONOSCOPY WITH PROPOFOL;  Surgeon: Clarene Essex, MD;  Location: [place];  Service: Endoscopy;  Laterality: N/A;  . HIP SURGERY    . INSERTION OF DIALYSIS CATHETER Left 04/24/2017   Procedure: EXCHANGE  OF DIALYSIS CATHETER;  Surgeon: Rosetta Posner, MD;  Location: [place];  Service: Vascular;  Laterality: Left;  . KIDNEY TRANSPLANT    . PERITONEAL CATHETER INSERTION Left 08/2017  . POLYPECTOMY  03/31/2019   Procedure: POLYPECTOMY;  Surgeon: Clarene Essex, MD;  [facility] END[facility]COPY;  Service: Endoscopy;;       Family History  Adopted: Yes    Social History   Tobacco Use  . Smoking status: Former Smoker    Packs/day: 0.20  . Smokeless tobacco: Never Used  Substance Use Topics  . Alcohol use: No  . Drug use: No    Home Medications Prior to Admission medications   Medication Sig Start Date End Date Taking? Authorizing Provider  acetaminophen (TYLENOL) 500 MG tablet Take 500-1,000 mg by mouth every 6 (six) hours as needed (pain or headaches).    Yes [provider]  calcitRIOL (ROCALTROL) 0.25 MCG capsule Take 0.75 mcg by mouth daily.   Yes [provider]  diphenhydrAMINE (BENADRYL) 25 mg capsule Take 25 mg by mouth every 6 (six) hours as needed for itching.   Yes [provider]  doxazosin (CARDURA) 4 MG tablet Take 4 mg by mouth at bedtime. 12/25/18  Yes [provider]  ferric citrate (AURYXIA) 1 GM 210 MG(Fe) tablet Take 210-420 mg by mouth See admin instructions. Take 210-420 mg by mouth one to three times a day with meals and 210 mg with each snack    [provider]  ferrous sulfate 325 (65 FE) MG tablet Take 325 mg by mouth 2 (two) times daily with a meal.    [provider]  insulin aspart (NOVOLOG FLEXPEN) 100 UNIT/ML FlexPen Inject 10 Units into the skin 3 (three) times daily with meals. Patient taking differently: Inject 2-10 Units into the skin See admin instructions. Inject 2-10 units into the skin three times a day with meals, per sliding scale 04/25/17   Mariel Aloe, MD  Insulin Detemir (LEVEMIR FLEXTOUCH) 100 UNIT/ML Pen Inject 15 Units into the skin at bedtime. Patient taking differently: Inject 15 Units into the skin every other day.  09/23/17   Regalado, Belkys A, MD  latanoprost (XALATAN) 0.005 % ophthalmic solution Place 1 drop into both eyes at bedtime.    [provider]  loperamide (IMODIUM) 2 MG capsule Take 2 mg by mouth as needed for diarrhea or loose stools.     [provider]  Melatonin 5 MG CAPS Take 5 mg by mouth at bedtime.     [provider]  Multiple Vitamins-Minerals (CENTRUM MEN) TABS Take 1 tablet by mouth 2 (two) times daily.    [provider]  omeprazole (PRILOSEC) 20 MG capsule Take 20 mg by mouth daily.    [provider]  potassium chloride SA (K-DUR) 20 MEQ tablet Take 20 mEq by mouth every Monday, Wednesday, and Friday. 10/15/18   [provider]  predniSONE (DELTASONE) 5 MG tablet Take 5 mg by mouth daily with breakfast.    [provider]  sodium bicarbonate 650 MG tablet Take 1,300 mg by mouth 3 (three) times daily.     [provider]  tacrolimus (PROGRAF) 0.5 MG capsule Take 0.5 mg by mouth daily.     [provider]  valGANciclovir (VALCYTE) 450 MG tablet Take 450 mg by mouth See admin instructions. Take one tablet (450 mg) by mouth on Monday, Wednesday, Friday before dialysis 08/22/17   [provider]  zolpidem (AMBIEN) 10 MG tablet Take 10 mg by mouth at bedtime as needed for sleep.     [provider]    Allergies    Tramadol hcl, Nsaids, Darvon [propoxyphene], and Tramadol  Review of Systems   Review of Systems  All other systems reviewed and are negative.   Physical Exam Updated Vital Signs BP 129/77   Pulse (!) 105   Temp 99.6 F (37.6 C) (Oral)   Resp 16   Ht 5\' 6"  (1.676 m)   Wt 68 kg   SpO2 100%   BMI 24.21 kg/m   Physical Exam Vitals and nursing note reviewed. Exam conducted with a chaperone present.  Constitutional:      Appearance: Normal appearance.  HENT:  Head: Normocephalic and atraumatic.  Eyes:     General: No scleral icterus.    Conjunctiva/sclera: Conjunctivae normal.  Cardiovascular:     Rate and Rhythm: Normal rate and regular rhythm.     Pulses: Normal pulses.     Heart sounds: Normal heart sounds.  Pulmonary:     Effort: Pulmonary effort is normal. No respiratory distress.     Breath sounds: Normal breath sounds.  Abdominal:     Comments: Soft, nondistended.  TTP diffusely.  No guarding.  No overlying skin changes.  Scar appreciated in RLQ.  Normoactive bowel sounds.  Musculoskeletal:     Cervical back: Normal range of motion. No rigidity.  Skin:    General: Skin is dry.     Capillary Refill: Capillary refill takes less than 2 seconds.  Neurological:     Mental Status: He is alert and oriented to person, place, and time.     GCS: GCS eye subscore is 4. GCS verbal subscore is 5. GCS motor subscore is 6.  Psychiatric:        Mood and Affect: Mood normal.        Behavior: Behavior normal.        Thought Content: Thought content normal.     ED Results / Procedures / Treatments   Labs (all labs ordered are listed, but only abnormal results are displayed) Labs Reviewed  COMPREHENSIVE METABOLIC PANEL - Abnormal; Notable for the following components:      Result Value   Chloride 97 (*)    Glucose, Bld 57 (*)    BUN 31 (*)    Creatinine, Ser 13.42 (*)    Calcium 8.8 (*)    Albumin 2.5 (*)    GFR calc non Af Amer 4  (*)    GFR calc Af Amer 5 (*)    All other components within normal limits  CBC - Abnormal; Notable for the following components:   WBC 2.8 (*)    RBC 2.10 (*)    Hemoglobin 7.7 (*)    HCT 23.3 (*)    MCV 111.0 (*)    MCH 36.7 (*)    RDW 15.8 (*)    All other components within normal limits  LIPASE, BLOOD  URINALYSIS, ROUTINE W REFLEX MICROSCOPIC    EKG None  Radiology No results found.  Procedures Procedures (including critical care time)  Medications Ordered in ED Medications  ondansetron (ZOFRAN-ODT) disintegrating tablet 4 mg (4 mg Oral Given 10/28/19 1600)  acetaminophen (TYLENOL) tablet 500 mg (500 mg Oral Given 10/28/19 1600)    ED Course  I have reviewed the triage vital signs and the nursing notes.  Pertinent labs & imaging results that were available during my care of the patient were reviewed by me and considered in my medical decision making (see chart for details).    MDM Rules/Calculators/A&P                      I reviewed patient's past medical history and he had a CT abdomen pelvis without contrast obtained 03/14/2019 demonstrated colitis as well as mild sigmoid diverticulosis, without diverticulitis.  No aortic aneurysm appreciated.  At shift change care was transferred to [facility], PA-C who will follow pending studies, re-evaluate, and determine disposition.      Final Clinical Impression(s) / ED Diagnoses Final diagnoses:  Generalized abdominal pain    Rx / DC Orders ED Discharge Orders    None  Corena Herter, PA-C 10/28/19 1603    Tegeler, Gwenyth Allegra, MD 10/28/19 [phone]

## 2019-10-28 NOTE — ED Notes (Signed)
Pt aware of need for urine specimen. State's he's been trying to urinate all day.

## 2019-10-28 NOTE — ED Notes (Signed)
Nephrology at bedside

## 2019-10-28 NOTE — H&P (Signed)
History and Physical   James [name]:[id number] DOB: September 11, 1970 DOA: 10/28/2019  Referring MD/NP/PA: Dr. Vanita Panda  PCP: [name], MD   Outpatient Specialists: Dr. Brayton El, nephrology in [facility]  Patient coming from: Home  Chief Complaint: Abdominal pain  HPI: James [name] is a 49 y.o. male with medical history significant of end-stage renal disease status post renal transplant in 2008 with failure in 2018 now on peritoneal dialysis, diabetes, hypertension, hyperlipidemia, previous tobacco abuse who presented to the ER with 2 days of abdominal pain nausea vomiting and diarrhea.  Symptoms started suddenly.  They have been progressive and getting worse.  It was worse earlier this morning.  Now is getting better this afternoon.  He has some fever and chills.  Patient seen in the ER evaluated and concern for spontaneous bacterial peritonitis.  Nephrology consulted and patient is being admitted for further work-up.  He has had no prior peritonitis.  He has been doing his hemodialysis as instructed.  At this point he is stable..  ED Course: Temperature is 99.6 blood pressure 164/84 pulse 114 respirate 23 oxygen sat 97% on room air.  White count 1.5 hemoglobin 7.6 and platelet count of 84.  Creatinine is 12.94 lactic acid 1.5.  COVID-19 results currently pending.  CT abdomen pelvis shows newly enlarged right external iliac lymph node.  Small volume ascites.  Percutaneous dialysis catheter noted.  No bowel obstruction.  Patient is being admitted for further work-up.  Review of Systems: As per HPI otherwise 10 point review of systems negative.    Past Medical History:  Diagnosis Date  . Anemia   . Diabetes mellitus without complication ([place])   . Hypertension   . Influenza A   . Pneumonia 04/21/2017  . Renal disorder   . 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 He  then got HD in [facility] until getting a renal transplant in 2009 [place].  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    . Smoker   . Tachycardia   . Transplant recipient     Past Surgical History:  Procedure Laterality Date  . BIOPSY  03/31/2019   Procedure: BIOPSY;  Surgeon: Clarene Essex, MD;  Location: [place];  Service: Endoscopy;;  . CHOLECYSTECTOMY    . COLONOSCOPY WITH PROPOFOL N/A 03/31/2019   Procedure: COLONOSCOPY WITH PROPOFOL;  Surgeon: Clarene Essex, MD;  Location: [place];  Service: Endoscopy;  Laterality: N/A;  . HIP SURGERY    . INSERTION OF DIALYSIS CATHETER Left 04/24/2017   Procedure: EXCHANGE  OF DIALYSIS CATHETER;  Surgeon: Rosetta Posner, MD;  Location: [place];  Service: Vascular;  Laterality: Left;  . KIDNEY TRANSPLANT    . PERITONEAL CATHETER INSERTION Left 08/2017  . POLYPECTOMY  03/31/2019   Procedure: POLYPECTOMY;  Surgeon: Clarene Essex, MD;  Location: [facility] ENDOSCOPY;  Service: Endoscopy;;     reports that he has quit smoking. He smoked 0.20 packs per day. He has never used smokeless tobacco. He reports that he does not drink alcohol or use drugs.  Allergies  Allergen Reactions  . Tramadol Hcl Shortness Of Breath and Other (See Comments)    Tramadol HCl =  Shortness of breath and Tramadol = Rash  . Nsaids Other (See Comments)    Was told by MD to not take these  . Darvon [Propoxyphene] Rash and Other (See Comments)    Broke out on eyelid   . Tramadol Rash and Other (See Comments)    Tramadol = Rash and Tramadol HCl = Shortness of breath    Family History  Adopted: Yes     Prior to Admission medications   Medication Sig Start Date End Date Taking? Authorizing Provider  acetaminophen (TYLENOL) 500 MG tablet Take 500-1,000 mg by mouth every 6 (six) hours as needed (pain or headaches).    Yes [provider]  calcitRIOL (ROCALTROL) 0.25 MCG capsule Take 0.75 mcg by mouth daily.   Yes [provider]  diphenhydrAMINE (BENADRYL) 25 mg capsule Take 25 mg by mouth every 6 (six) hours as needed for itching.   Yes [provider]  doxazosin (CARDURA) 4 MG tablet Take 4 mg by mouth at bedtime. 12/25/18  Yes [provider]  ferric citrate (AURYXIA) 1 GM 210 MG(Fe) tablet Take 210-420 mg by mouth See admin instructions. Take 210-420 mg by mouth one to three times a day with meals and 210 mg with each snack    [provider]  ferrous sulfate 325 (65 FE) MG tablet Take 325 mg by mouth 2 (two) times daily with a meal.    [provider]  insulin aspart (NOVOLOG FLEXPEN) 100 UNIT/ML FlexPen Inject 10 Units into the skin 3 (three) times daily with meals. Patient taking differently: Inject 2-10 Units into the skin See admin instructions. Inject 2-10 units into the skin three times a day with meals, per sliding scale 04/25/17   Mariel Aloe, MD  Insulin Detemir (LEVEMIR FLEXTOUCH) 100 UNIT/ML Pen Inject 15 Units into the skin at bedtime. Patient taking differently: Inject 15 Units into the skin every other day.  09/23/17   Regalado, Belkys A, MD  latanoprost (XALATAN) 0.005 % ophthalmic solution Place 1 drop into both eyes at bedtime.    [provider]  loperamide (IMODIUM) 2 MG capsule Take 2 mg by mouth as needed for diarrhea or loose stools.     [provider]  Melatonin 5 MG CAPS Take 5 mg by mouth at bedtime.     [provider]  Multiple Vitamins-Minerals (CENTRUM MEN) TABS Take 1 tablet by mouth 2 (two) times daily.    [provider]  omeprazole (PRILOSEC) 20 MG capsule Take 20 mg by mouth daily.    [provider]  potassium chloride SA (K-DUR) 20 MEQ tablet Take 20 mEq by mouth every Monday, Wednesday, and Friday. 10/15/18   [provider]  predniSONE (DELTASONE) 5 MG tablet Take 5 mg by mouth  daily with breakfast.    [provider]  sodium bicarbonate 650 MG tablet Take 1,300 mg by mouth 3 (three) times daily.     [provider]  tacrolimus (PROGRAF) 0.5 MG capsule Take 0.5 mg by mouth daily.     [provider]  valGANciclovir (VALCYTE) 450 MG tablet Take 450 mg by mouth See admin instructions. Take one tablet (450 mg) by mouth on Monday, Wednesday, Friday before dialysis 08/22/17   [provider]  zolpidem (AMBIEN) 10 MG tablet Take 10 mg by mouth at bedtime as needed for sleep.     [provider]    Physical Exam: Vitals:   10/28/19 5170 10/28/19 1930 10/28/19 2238 10/28/19 2239  BP:  131/81 (!) 164/84   Pulse:  (!) 105 (!) 114 (!) 114  Resp:  20 18 18   Temp: 99 F (37.2 C)     TempSrc: Oral     SpO2:  97% 99% 99%  Weight:      Height:          Constitutional: NAD, calm, comfortable Vitals:   10/28/19 1832 10/28/19 1930 10/28/19 2238 10/28/19 2239  BP:  131/81 (!) 164/84   Pulse:  (!) 105 (!) 114 (!) 114  Resp:  20 18 18   Temp: 99 F (37.2 C)     TempSrc: Oral     SpO2:  97% 99% 99%  Weight:      Height:       Eyes: PERRL, lids and conjunctivae normal ENMT: Mucous membranes are dry. Posterior pharynx clear of any exudate or lesions.Normal dentition.  Neck: normal, supple, no masses, no thyromegaly Respiratory: clear to auscultation bilaterally, no wheezing, no crackles. Normal respiratory effort. No accessory muscle use.  Cardiovascular: Sinus tachycardia no murmurs / rubs / gallops. No extremity edema. 2+ pedal pulses. No carotid bruits.  Abdomen: Diffuse tenderness: No masses palpated. No hepatosplenomegaly. Bowel sounds positive.  Musculoskeletal: no clubbing / cyanosis. No joint deformity upper and lower extremities. Good ROM, no contractures. Normal muscle tone.  Skin: no rashes, lesions, ulcers. No induration Neurologic: CN 2-12 grossly intact. Sensation intact, DTR normal. Strength 5/5 in all 4.    Psychiatric: Normal judgment and insight. Alert and oriented x 3. Normal mood.     Labs on Admission: I have personally reviewed following labs and imaging studies  CBC: Recent Labs  Lab 10/28/19 1024 10/28/19 2024  WBC 2.8* 1.5*  HGB 7.7* 7.6*  HCT 23.3* 24.0*  MCV 111.0* 113.7*  PLT 150 84*   Basic Metabolic Panel: Recent Labs  Lab 10/28/19 1024 10/28/19 2024  NA 137  --   K 3.6  --   CL 97*  --   CO2 25  --   GLUCOSE 57*  --   BUN 31*  --   CREATININE 13.42* 12.94*  CALCIUM 8.8*  --    GFR: Estimated Creatinine Clearance: 6.4 mL/min (A) (by C-G formula based on SCr of 12.94 mg/dL (H)). Liver Function Tests: Recent Labs  Lab 10/28/19 1024  AST 24  ALT 20  ALKPHOS 89  BILITOT 0.5  PROT 7.1  ALBUMIN 2.5*   Recent Labs  Lab 10/28/19 1024  LIPASE 24   No results for input(s): AMMONIA in the last 168 hours. Coagulation Profile: No results for input(s): INR, PROTIME in the last 168 hours. Cardiac Enzymes: No results for input(s): CKTOTAL, CKMB, CKMBINDEX, TROPONINI in the last 168 hours. BNP (last 3 results) No results for input(s): PROBNP in the last 8760 hours. HbA1C: No results for input(s): HGBA1C in the last 72 hours. CBG: Recent Labs  Lab 10/28/19 2301  GLUCAP 68*   Lipid Profile: No results for input(s): CHOL, HDL, LDLCALC, TRIG, CHOLHDL, LDLDIRECT in the last 72 hours. Thyroid Function Tests: No results for input(s): TSH, T4TOTAL, FREET4, T3FREE, THYROIDAB in the last 72 hours. Anemia Panel: No results for input(s): VITAMINB12, FOLATE, FERRITIN, TIBC, IRON, RETICCTPCT in the last 72 hours. Urine analysis:    Component Value Date/Time   COLORURINE YELLOW 03/28/2019 1008   APPEARANCEUR CLOUDY (A) 03/28/2019 1008   LABSPEC 1.016 03/28/2019 1008   PHURINE 6.0 03/28/2019 1008   GLUCOSEU NEGATIVE 03/28/2019 1008   HGBUR NEGATIVE 03/28/2019 1008  BILIRUBINUR NEGATIVE 03/28/2019 1008   KETONESUR NEGATIVE 03/28/2019 1008   PROTEINUR >=300  (A) 03/28/2019 1008   UROBILINOGEN 0.2 04/09/2014 2013   NITRITE NEGATIVE 03/28/2019 1008   LEUKOCYTESUR TRACE (A) 03/28/2019 1008   Sepsis Labs: @LABRCNTIP (procalcitonin:4,lacticidven:4) ) Recent Results (from the past 240 hour(s))  SARS CORONAVIRUS 2 (TAT 6-24 HRS) Nasopharyngeal Nasopharyngeal Swab     Status: None   Collection Time: 10/28/19  6:27 PM   Specimen: Nasopharyngeal Swab  Result Value Ref Range Status   SARS Coronavirus 2 NEGATIVE NEGATIVE Final    Comment: (NOTE) SARS-CoV-2 target nucleic acids are NOT DETECTED. The SARS-CoV-2 RNA is generally detectable in upper and lower respiratory specimens during the acute phase of infection. Negative results do not preclude SARS-CoV-2 infection, do not rule out co-infections with other pathogens, and should not be used as the sole basis for treatment or other patient management decisions. Negative results must be combined with clinical observations, patient history, and epidemiological information. The expected result is Negative. Fact Sheet for Patients: SugarRoll.be Fact Sheet for Healthcare Providers: https://www.woods-mathews.com/ This test is not yet approved or cleared by the [place] FDA and  has been authorized for detection and/or diagnosis of SARS-CoV-2 by FDA under an Emergency Use Authorization (EUA). This EUA will remain  in effect (meaning this test can be used) for the duration of the COVID-19 declaration under Section 56 4(b)(1) of the Act, 21 U.S.C. section 360bbb-3(b)(1), unless the authorization is terminated or revoked sooner. Performed at [facility], [place] [address] [place], [place] [address]      Radiological Exams on Admission: CT ABDOMEN PELVIS WO CONTRAST  Result Date: 10/28/2019 CLINICAL DATA:  Acute generalized abdominal pain, nausea and vomiting. EXAM: CT ABDOMEN AND PELVIS WITHOUT CONTRAST TECHNIQUE: Multidetector CT imaging of the abdomen  and pelvis was performed following the standard protocol without IV contrast. COMPARISON:  03/14/2019 CT abdomen/pelvis. FINDINGS: Lower chest: No significant pulmonary nodules or acute consolidative airspace disease. Low cardiac blood pool density suggests anemia. Suggestion of right coronary atherosclerosis. Hepatobiliary: Normal liver size. Simple scattered small right liver cysts, largest 1.2 cm. A few scattered subcentimeter hypodense liver lesions are too small to characterize and are unchanged and presumably benign. No new liver lesions. Cholecystectomy no biliary ductal dilatation. Pancreas: Normal, with no mass or duct dilation. Spleen: Normal size. No mass. Adrenals/Urinary Tract: Normal adrenals. Severe symmetric bilateral renal atrophy, unchanged. No hydronephrosis. No renal stones. Simple exophytic 1.7 cm posterior lower left renal cyst. No additional contour deforming renal lesions. Right lower quadrant renal transplant demonstrates no hydronephrosis, no renal stones and no contour deforming renal masses. Nondistended bladder obscured by streak artifact from right hip hardware with no gross bladder abnormality. Stomach/Bowel: Normal non-distended stomach. Normal caliber small bowel with no small bowel wall thickening. Candidate normal appendix. Scattered mild colonic diverticulosis with no large bowel wall thickening. Vascular/Lymphatic: Atherosclerotic nonaneurysmal abdominal aorta. Newly enlarged 1.3 cm right external iliac node (series 3/image 74). No additional pathologically enlarged lymph nodes in the abdomen or pelvis. Reproductive: Normal size prostate. Other: No pneumoperitoneum. Percutaneous dialysis catheter terminates in the anterior right lower peritoneal cavity. Small volume ascites with no focal fluid collection. Musculoskeletal: No aggressive appearing focal osseous lesions. Right total hip arthroplasty. IMPRESSION: 1. Newly enlarged right external iliac lymph node, nonspecific. Suggest  attention on follow-up CT abdomen/pelvis in 3 months. This recommendation follows ACR consensus guidelines: White Paper of the ACR Incidental Findings Committee II on Splenic and Nodal Findings. J Am Coll Radiol  3419;37:902-409. 2. Small volume ascites. Percutaneous dialysis catheter terminates in the anterior right lower peritoneal cavity. 3. No evidence of bowel obstruction or acute bowel inflammation. Scattered mild colonic diverticulosis with no evidence of acute diverticulitis. 4. Severe symmetric bilateral native renal atrophy. Right lower quadrant renal transplant appears unremarkable. 5. Low cardiac blood pool density suggests anemia. 6. Aortic Atherosclerosis (ICD10-I70.0). Electronically Signed   By: Ilona Sorrel M.D.   On: 10/28/2019 15:50      Assessment/Plan Principal Problem:   Spontaneous bacterial peritonitis ([place]) Active Problems:   ESRD on peritoneal dialysis ([place])   Diabetes mellitus without complication ([place])   Hypertension   Renal transplant recipient     #1 suspected spontaneous bacterial peritonitis: Patient will be admitted.  IV antibiotics initiated.  Will get peritoneal fluids aspirated and sent for cultures.  We will continue dialysis per nephrology.  Other empiric measures will be employed.  #2 diabetes: Maintain blood sugar with sliding scale insulin.  #3 end-stage renal disease: Continue as per #1 above.  #4 hypertension: Counseling provided.  Continue management.  #5 peripheral vascular disease: Patient is status post recent right BKA.  Appears to be healing well.  #6 pancytopenia: Noted on his labs.  May be medication related.  Continue to monitor   DVT prophylaxis: Heparin Code Status: Full code Family Communication: No family at bedside Disposition Plan: Home Consults called: [facility], Dr. Johnney Ou Admission status: Inpatient  Severity of Illness: The appropriate patient status for this patient is INPATIENT. Inpatient status is  judged to be reasonable and necessary in order to provide the required intensity of service to ensure the patient's safety. The patient's presenting symptoms, physical exam findings, and initial radiographic and laboratory data in the context of their chronic comorbidities is felt to place them at high risk for further clinical deterioration. Furthermore, it is not anticipated that the patient will be medically stable for discharge from the hospital within 2 midnights of admission. The following factors support the patient status of inpatient.   " The patient's presenting symptoms include abdominal pain. " The worrisome physical exam findings include low-grade temperature with diffuse abdominal pain. " The initial radiographic and laboratory data are worrisome because of pancytopenia. " The chronic co-morbidities include end-stage renal disease.   * I certify that at the point of admission it is my clinical judgment that the patient will require inpatient hospital care spanning beyond 2 midnights from the point of admission due to high intensity of service, high risk for further deterioration and high frequency of surveillance required.Barbette Merino MD [facility] Pager [phone]  If 7PM-7AM, please contact night-coverage [email or web address] Password [email or web address]  10/29/2019, 12:12 AM

## 2019-10-28 NOTE — ED Notes (Signed)
Called dialysis to see if they could draw peritoneal fluid from pt.  They stated they would have to call me back.

## 2019-10-28 NOTE — [place] Notes (Addendum)
Pharmacy Antibiotic Note  James [name] is a 49 y.o. male admitted on 10/28/2019 with spontaneous bacterial peritonitis.  Pharmacy has been consulted for Vancomycin and Ceftazidime IV dosing.  Patient afebrile, WBC low at 2.8. Patient has ESRD and receives peritoneal dialysis PTA - last session was this morning but was stopped early due to significant abdominal pain.   Plan: Ceftazidime 1g IV once Vancomycin 1500mg  IV once Check vancomycin level in 2-3 days Monitor frequency of dialysis, cultures/sensitivities, and clinical progression  Height: 5\' 6"  (167.6 cm) Weight: 68 kg (150 lb) IBW/kg (Calculated) : 63.8  Temp (24hrs), Avg:98.9 F (37.2 C), Min:98 F (36.7 C), Max:99.6 F (37.6 C)  Recent Labs  Lab 10/28/19 1024  WBC 2.8*  CREATININE 13.42*    Estimated Creatinine Clearance: 6.2 mL/min (A) (by C-G formula based on SCr of 13.42 mg/dL (H)).    Allergies  Allergen Reactions  . Tramadol Hcl Shortness Of Breath and Other (See Comments)    Tramadol HCl = Shortness of breath and Tramadol = Rash  . Nsaids Other (See Comments)    Was told by MD to not take these  . Darvon [Propoxyphene] Rash and Other (See Comments)    Broke out on eyelid   . Tramadol Rash and Other (See Comments)    Tramadol = Rash and Tramadol HCl = Shortness of breath    Antimicrobials this admission: Ceftazidime 4/7 >>  Vancomycin 4/7  >>   Dose adjustments this admission: N/A  Microbiology results: 4/7 BCx:  4/7 peritoneal fluid Cx:   Richardine Service, PharmD PGY1 Pharmacy Resident Phone: [phone] 10/28/2019  7:27 PM  Please check [email or web address] for unit-specific pharmacy phone numbers.

## 2019-10-28 NOTE — ED Triage Notes (Signed)
Pt arrives via EMS from home with reports of abd pain, N/V/D since yesterday. Home HD yesterday.   110/68 94 16 RR 97% CBG 77

## 2019-10-28 NOTE — ED Notes (Signed)
Called dialysis again.  They stated they would send another RN down to pull pd sample.  PA and Nephrology are aware that sample has not been drawn and that ED staff are unable to draw it.

## 2019-10-28 NOTE — ED Notes (Signed)
Dialysis nurses to bedside obtaining PD fluid.

## 2019-10-28 NOTE — Consult Note (Signed)
[facility]  INPATIENT CONSULTATION  Reason for Consultation: abd pain, ESRD on PD Requesting Provider: Dr. Vanita Panda  HPI: James [name] is an 49 y.o. 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  Pt had acute onset of nausea, emesis, watery diarrhea after eating Dorena Cookey and family member with GI bug.  Presented to ED for evaluation today.  Last emesis this AM, nonbloody.  Did PD yesterday, didn't note if fluid cloudy; no breaches of technique.    He had an admission for diarrhea 03/2019 and had peritonitis at that time; that diarrheal episode was protracted and w/u wasn't really revealing.  This episode is more acute in onset.  PMH: Past Medical History:  Diagnosis Date  . Anemia   . Diabetes mellitus without complication ([place])   . Hypertension   . Influenza A   . Pneumonia 04/21/2017  . Renal disorder   . 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 He then got HD in [facility] until getting a renal transplant in 2009 at [place], [place].  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    . Smoker   . Tachycardia   . Transplant recipient    PSH: Past Surgical History:  Procedure Laterality Date  . BIOPSY  03/31/2019   Procedure: BIOPSY;  Surgeon: Clarene Essex, MD;  Location: [place];  Service: Endoscopy;;  . CHOLECYSTECTOMY    . COLONOSCOPY WITH PROPOFOL N/A 03/31/2019   Procedure: COLONOSCOPY WITH PROPOFOL;  Surgeon: Clarene Essex, MD;  Location: [place];  Service: Endoscopy;  Laterality: N/A;  . HIP SURGERY    . INSERTION OF DIALYSIS CATHETER Left  04/24/2017   Procedure: EXCHANGE  OF DIALYSIS CATHETER;  Surgeon: Rosetta Posner, MD;  Location: [place];  Service: Vascular;  Laterality: Left;  . KIDNEY TRANSPLANT    . PERITONEAL CATHETER INSERTION Left 08/2017  . POLYPECTOMY  03/31/2019   Procedure: POLYPECTOMY;  Surgeon: Clarene Essex, MD;  Location: [facility] ENDOSCOPY;  Service: Endoscopy;;     Past Medical History:  Diagnosis Date  . Anemia   . Diabetes mellitus without complication ([place])   . Hypertension   . Influenza A   . Pneumonia 04/21/2017  . Renal disorder   . 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 He then got HD in [place] until getting a renal transplant in 2009 at [place], [place].  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    . Smoker   . Tachycardia   . Transplant recipient     Medications:  I have reviewed the patient's current medications.  (Not in a hospital admission)   ALLERGIES:   Allergies  Allergen Reactions  . Tramadol  Hcl Shortness Of Breath and Other (See Comments)    Tramadol HCl = Shortness of breath and Tramadol = Rash  . Nsaids Other (See Comments)    Was told by MD to not take these  . Darvon [Propoxyphene] Rash and Other (See Comments)    Broke out on eyelid   . Tramadol Rash and Other (See Comments)    Tramadol = Rash and Tramadol HCl = Shortness of breath    FAM HX: Family History  Adopted: Yes    Social History:   reports that he has quit smoking. He smoked 0.20 packs per day. He has never used smokeless tobacco. He reports that he does not drink alcohol or use drugs.  ROS: 12 system ROS neg except per HPI  Blood pressure (!) 148/88, pulse (!) 110, temperature 99 F (37.2 C), temperature source Oral, resp. rate (!) 22, height 5\' 6"   (1.676 m), weight 68 kg, SpO2 99 %. PHYSICAL EXAM: Gen: nontoxic but tired on stretcher  Eyes: anicteric ENT: MM tacky Neck: no JVD CV: RRR Abd:  Soft, moderately TTP, +rebound, no guarding Lungs: clear to bases Extr: 1+ L ankle edema, R BKA Neuro: conversant Skin: exit site for PD catheter uncovered but without drainage   Results for orders placed or performed during the hospital encounter of 10/28/19 (from the past 48 hour(s))  Lipase, blood     Status: None   Collection Time: 10/28/19 10:24 AM  Result Value Ref Range   Lipase 24 11 - 51 U/L    Comment: Performed at [facility], [address] [place],  [address]  Comprehensive metabolic panel     Status: Abnormal   Collection Time: 10/28/19 10:24 AM  Result Value Ref Range   Sodium 137 135 - 145 mmol/L   Potassium 3.6 3.5 - 5.1 mmol/L   Chloride 97 (L) 98 - 111 mmol/L   CO2 25 22 - 32 mmol/L   Glucose, Bld 57 (L) 70 - 99 mg/dL    Comment: Glucose reference range applies only to samples taken after fasting for at least 8 hours.   BUN 31 (H) 6 - 20 mg/dL   Creatinine, Ser 13.42 (H) 0.61 - 1.24 mg/dL   Calcium 8.8 (L) 8.9 - 10.3 mg/dL   Total Protein 7.1 6.5 - 8.1 g/dL   Albumin 2.5 (L) 3.5 - 5.0 g/dL   AST 24 15 - 41 U/L   ALT 20 0 - 44 U/L   Alkaline Phosphatase 89 38 - 126 U/L   Total Bilirubin 0.5 0.3 - 1.2 mg/dL   GFR calc non Af Amer 4 (L) >60 mL/min   GFR calc Af Amer 5 (L) >60 mL/min   Anion gap 15 5 - 15    Comment: Performed at [facility] [address] [place], [place] [address]  CBC     Status: Abnormal   Collection Time: 10/28/19 10:24 AM  Result Value Ref Range   WBC 2.8 (L) 4.0 - 10.5 K/uL   RBC 2.10 (L) 4.22 - 5.81 MIL/uL   Hemoglobin 7.7 (L) 13.0 - 17.0 g/dL   HCT 23.3 (L) 39.0 - 52.0 %   MCV 111.0 (H) 80.0 - 100.0 fL   MCH 36.7 (H) 26.0 - 34.0 pg   MCHC 33.0 30.0 - 36.0 g/dL   RDW 15.8 (H) 11.5 - 15.5 %   Platelets 150 150 - 400 K/uL   nRBC 0.0 0.0 - 0.2 %    Comment:  Performed at [facility], [place] [address], [place], [place] [address]    CT ABDOMEN PELVIS WO CONTRAST  Result Date: 10/28/2019 CLINICAL DATA:  Acute generalized abdominal pain, nausea and vomiting. EXAM: CT ABDOMEN AND PELVIS WITHOUT CONTRAST TECHNIQUE: Multidetector CT imaging of the abdomen and pelvis was performed following the standard protocol without IV contrast. COMPARISON:  03/14/2019 CT abdomen/pelvis. FINDINGS: Lower chest: No significant pulmonary nodules or acute consolidative airspace disease. Low cardiac blood pool density suggests anemia. Suggestion of right coronary atherosclerosis. Hepatobiliary: Normal liver size. Simple scattered small right liver cysts, largest 1.2 cm. A few scattered subcentimeter hypodense liver lesions are too small to characterize and are unchanged and presumably benign. No new liver lesions. Cholecystectomy no biliary ductal dilatation. Pancreas: Normal, with no mass or duct dilation. Spleen: Normal size. No mass. Adrenals/Urinary Tract: Normal adrenals. Severe symmetric bilateral renal atrophy, unchanged. No hydronephrosis. No renal stones. Simple exophytic 1.7 cm posterior lower left renal cyst. No additional contour deforming renal lesions. Right lower quadrant renal transplant demonstrates no hydronephrosis, no renal stones and no contour deforming renal masses. Nondistended bladder obscured by streak artifact from right hip hardware with no gross bladder abnormality. Stomach/Bowel: Normal non-distended stomach. Normal caliber small bowel with no small bowel wall thickening. Candidate normal appendix. Scattered mild colonic diverticulosis with no large bowel wall thickening. Vascular/Lymphatic: Atherosclerotic nonaneurysmal abdominal aorta. Newly enlarged 1.3 cm right external iliac node (series 3/image 74). No additional pathologically enlarged lymph nodes in the abdomen or pelvis. Reproductive: Normal size prostate. Other: No pneumoperitoneum. Percutaneous  dialysis catheter terminates in the anterior right lower peritoneal cavity. Small volume ascites with no focal fluid collection. Musculoskeletal: No aggressive appearing focal osseous lesions. Right total hip arthroplasty. IMPRESSION: 1. Newly enlarged right external iliac lymph node, nonspecific. Suggest attention on follow-up CT abdomen/pelvis in 3 months. This recommendation follows ACR consensus guidelines: White Paper of the ACR Incidental Findings Committee II on Splenic and Nodal Findings. J Am Coll Radiol 2013;10:789-794. 2. Small volume ascites. Percutaneous dialysis catheter terminates in the anterior right lower peritoneal cavity. 3. No evidence of bowel obstruction or acute bowel inflammation. Scattered mild colonic diverticulosis with no evidence of acute diverticulitis. 4. Severe symmetric bilateral native renal atrophy. Right lower quadrant renal transplant appears unremarkable. 5. Low cardiac blood pool density suggests anemia. 6. Aortic Atherosclerosis (ICD10-I70.0). Electronically Signed   By: Ilona Sorrel M.D.   On: 10/28/2019 15:50    Assessment/Plan **Abdominal pain: CT unrevealing.  Had contact with viral gastroenteritis. C diff and viral path panel per primary.  R/o peritonitis with PD fluid sample - RN aware to collect.  Vanc/ceftaz empirically for now - if peritonitis would switch to IP abx for subsequent doses. Prior peritonitis 03/2019 was staph hemolyticus.  **ESRD on CCPD:  Follows with [facility] [place].  Orders unavailable currently - says 6 fills over 9 hrs, doesn't know volume.  Rx 2L fills tonight, no last fill.  1.5% in face of GI losses.  Heparin 500u/L dialysate with possible peritonitis.  Provide topical gent and exit site dressing.   **Anemia:  Hb 7.7, last here was 8.1 in 05/2019; can obtain outpt records tomorrow.  **Metabolic bone disease:  Home binder when eating, check phos and PTH if remains admitted.  Corr ca ok.  **DM: per primary  **HTN:  Currently  normotensive, home meds per primary.   **malnutrition: supplements when tolerating po intake.  **h/o B cell lymphoma    Justin Mend 10/28/2019, 7:37 PM

## 2019-10-28 NOTE — ED Provider Notes (Signed)
Maliik Karner Gerken is a 49 y.o. male, presenting to the ED with abdominal pain beginning this morning.  Pain seems to be primarily periumbilical, radiating throughout the abdomen, severe at onset, 3/10 at the time of my interview, sharp and aching.   Nephrologist: Cay Schillings   HPI from Krista Blue, PA-C: "James [name] is a 49 y.o. 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  Patient reports that he receives hemodialysis at home on a daily basis.  During his hemodialysis this morning, he had to stop due to significant 7 of 10 abdominal pain.  He also has been experiencing loose stools, approximately 8 nonbloody, nonmelanotic stools in the past 12 hours.  He also endorses low-grade fevers of 99.5 F during this period of time.  He denies any headache or dizziness, chest pain or difficulty breathing, cough, urinary symptoms, melena, hematochezia, or other symptoms.  He states that he had a gangrene infection in January, but he has been receiving physical therapy regularly and has been doing well."  Past Medical History:  Diagnosis Date  . Anemia   . Diabetes mellitus without complication ([place])   . Hypertension   . Influenza A   . Pneumonia 04/21/2017  . Renal disorder   . 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 He then got HD in [facility] until getting a renal transplant in 2009 at [place], [place].  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    . Smoker   . Tachycardia   . Transplant recipient       Physical Exam  BP 129/77   Pulse (!) 105   Temp 99.6 F (37.6 C) (Oral)   Resp 16   SpO2 100%     Physical Exam Vitals and nursing note reviewed.  Constitutional:      General: He is not in acute distress.    Appearance: He is well-developed. He is not diaphoretic.  HENT:     Head: Normocephalic and atraumatic.     Mouth/Throat:     Mouth: Mucous membranes are moist.     Pharynx: Oropharynx is clear.  Eyes:     Conjunctiva/sclera: Conjunctivae normal.  Cardiovascular:     Rate and Rhythm: Normal rate and regular rhythm.     Pulses: Normal pulses.          Radial pulses are 2+ on the right side and 2+ on the left side.       Posterior tibial pulses are 2+ on the right side and 2+ on the left side.     Heart sounds: Normal heart sounds.     Comments: Tactile temperature in the extremities appropriate and equal bilaterally. Pulmonary:     Effort: Pulmonary effort is normal. No respiratory distress.     Breath sounds: Normal breath sounds.  Abdominal:     Palpations: Abdomen is soft.     Tenderness: There is abdominal tenderness. There is no guarding.    Musculoskeletal:     Cervical back: Neck supple.  Right lower leg: No edema.     Left lower leg: No edema.  Skin:    General: Skin is warm and dry.  Neurological:     Mental Status: He is alert.  Psychiatric:        Mood and Affect: Mood and affect normal.        Speech: Speech normal.        Behavior: Behavior normal.     ED Course/Procedures     .Critical Care Performed by: Lorayne Bender, PA-C Authorized by: Lorayne Bender, PA-C   Critical care provider statement:    Critical care time (minutes):  45   Critical care time was exclusive of:  Separately billable procedures and treating other patients   Critical care was necessary to treat or prevent imminent or life-threatening deterioration of the following conditions: Suspected intraabdominal infection; spontaneous bacterial peritonitis.   Critical care was time spent personally by me on the following activities:  Ordering and performing treatments and  interventions, ordering and review of laboratory studies, ordering and review of radiographic studies, pulse oximetry, re-evaluation of patient's condition, review of old charts, development of treatment plan with patient or surrogate, discussions with consultants, obtaining history from patient or surrogate, examination of patient and evaluation of patient's response to treatment   I assumed direction of critical care for this patient from another provider in my specialty: yes       Abnormal Labs Reviewed  COMPREHENSIVE METABOLIC PANEL - Abnormal; Notable for the following components:      Result Value   Chloride 97 (*)    Glucose, Bld 57 (*)    BUN 31 (*)    Creatinine, Ser 13.42 (*)    Calcium 8.8 (*)    Albumin 2.5 (*)    GFR calc non Af Amer 4 (*)    GFR calc Af Amer 5 (*)    All other components within normal limits  CBC - Abnormal; Notable for the following components:   WBC 2.8 (*)    RBC 2.10 (*)    Hemoglobin 7.7 (*)    HCT 23.3 (*)    MCV 111.0 (*)    MCH 36.7 (*)    RDW 15.8 (*)    All other components within normal limits  CBC - Abnormal; Notable for the following components:   WBC 1.5 (*)    RBC 2.11 (*)    Hemoglobin 7.6 (*)    HCT 24.0 (*)    MCV 113.7 (*)    MCH 36.0 (*)    RDW 15.9 (*)    Platelets 84 (*)    All other components within normal limits  CREATININE, SERUM - Abnormal; Notable for the following components:   Creatinine, Ser 12.94 (*)    GFR calc non Af Amer 4 (*)    GFR calc Af Amer 5 (*)    All other components within normal limits   Hemoglobin  Date Value Ref Range Status  10/28/2019 7.6 (L) 13.0 - 17.0 g/dL Final  10/28/2019 7.7 (L) 13.0 - 17.0 g/dL Final  06/04/2019 8.1 (L) 13.0 - 17.0 g/dL Final  04/01/2019 7.4 (L) 13.0 - 17.0 g/dL Final    CT ABDOMEN PELVIS WO CONTRAST  Result Date: 10/28/2019 CLINICAL DATA:  Acute generalized abdominal pain, nausea and vomiting. EXAM: CT ABDOMEN AND PELVIS WITHOUT CONTRAST TECHNIQUE: Multidetector  CT imaging of the abdomen and pelvis was performed following the standard protocol without IV contrast. COMPARISON:  03/14/2019 CT abdomen/pelvis. FINDINGS: Lower  chest: No significant pulmonary nodules or acute consolidative airspace disease. Low cardiac blood pool density suggests anemia. Suggestion of right coronary atherosclerosis. Hepatobiliary: Normal liver size. Simple scattered small right liver cysts, largest 1.2 cm. A few scattered subcentimeter hypodense liver lesions are too small to characterize and are unchanged and presumably benign. No new liver lesions. Cholecystectomy no biliary ductal dilatation. Pancreas: Normal, with no mass or duct dilation. Spleen: Normal size. No mass. Adrenals/Urinary Tract: Normal adrenals. Severe symmetric bilateral renal atrophy, unchanged. No hydronephrosis. No renal stones. Simple exophytic 1.7 cm posterior lower left renal cyst. No additional contour deforming renal lesions. Right lower quadrant renal transplant demonstrates no hydronephrosis, no renal stones and no contour deforming renal masses. Nondistended bladder obscured by streak artifact from right hip hardware with no gross bladder abnormality. Stomach/Bowel: Normal non-distended stomach. Normal caliber small bowel with no small bowel wall thickening. Candidate normal appendix. Scattered mild colonic diverticulosis with no large bowel wall thickening. Vascular/Lymphatic: Atherosclerotic nonaneurysmal abdominal aorta. Newly enlarged 1.3 cm right external iliac node (series 3/image 74). No additional pathologically enlarged lymph nodes in the abdomen or pelvis. Reproductive: Normal size prostate. Other: No pneumoperitoneum. Percutaneous dialysis catheter terminates in the anterior right lower peritoneal cavity. Small volume ascites with no focal fluid collection. Musculoskeletal: No aggressive appearing focal osseous lesions. Right total hip arthroplasty. IMPRESSION: 1. Newly enlarged right external iliac lymph  node, nonspecific. Suggest attention on follow-up CT abdomen/pelvis in 3 months. This recommendation follows ACR consensus guidelines: White Paper of the ACR Incidental Findings Committee II on Splenic and Nodal Findings. J Am Coll Radiol 2013;10:789-794. 2. Small volume ascites. Percutaneous dialysis catheter terminates in the anterior right lower peritoneal cavity. 3. No evidence of bowel obstruction or acute bowel inflammation. Scattered mild colonic diverticulosis with no evidence of acute diverticulitis. 4. Severe symmetric bilateral native renal atrophy. Right lower quadrant renal transplant appears unremarkable. 5. Low cardiac blood pool density suggests anemia. 6. Aortic Atherosclerosis (ICD10-I70.0). Electronically Signed   By: Ilona Sorrel M.D.   On: 10/28/2019 15:50    MDM   Clinical Course as of Oct 27 2233  Wed Oct 28, 2019  1748 Dr. Johnney Ou, nephrologist. Agrees with admission decision and starting antibiotics.  Requests vancomycin and ceftazidime. States a dialysis nurse should come down to draw fluid from the patient's peritoneal dialysis catheter.   [SJ]  1907 Spoke with Dr. Jonelle Sidle, hospitalist.  Agrees to admit the patient.   [SJ]    Clinical Course User Index [SJ] Lorayne Bender, PA-C   Patient care handoff report received from Krista Blue, PA-C. Plan: CT abdomen/pelvis pending.  Review imaging and reassess patient for appropriate disposition.  Patient is a peritoneal dialysis patient with abdominal pain and subjective fever, accompanied by nausea, vomiting, and diarrhea.  He is at high risk for SBP. He is not truly febrile here, though he is tachycardic and is quite tender in his abdomen.  Unfortunately, he has presented in a similar manner in the past with SBP, therefore, my suspicion for this diagnosis is elevated.  CT with acute finding of lymphadenopathy and ascites.  I specifically discussed the finding of lymphadenopathy as well as the recommendation for repeat CT scan  in 3 months.  I personally reviewed and interpreted the patient's labs and imaging studies.  Findings and plan of care discussed with Adrian Prows, MD.   Vitals:   10/28/19 1815 10/28/19 1830 10/28/19 1832 10/28/19 1930  BP: 139/84 (!) 148/88  131/81  Pulse: (!) 108 (!) 110  Marland Kitchen)  105  Resp: 13 (!) 22  20  Temp:   99 F (37.2 C)   TempSrc:   Oral   SpO2: 99% 99%  97%  Weight:      Height:          Layla Maw 10/28/19 2238    Carmin Muskrat, MD 10/28/19 [phone]

## 2019-10-29 ENCOUNTER — Other Ambulatory Visit: Payer: Self-pay

## 2019-10-29 ENCOUNTER — Encounter (HOSPITAL_COMMUNITY): Payer: Self-pay | Admitting: Internal Medicine

## 2019-10-29 DIAGNOSIS — K652 Spontaneous bacterial peritonitis: Secondary | ICD-10-CM | POA: Diagnosis not present

## 2019-10-29 LAB — CBC
HCT: 24 % — ABNORMAL LOW (ref 39.0–52.0)
HCT: 14.2 % — ABNORMAL LOW (ref 39.0–52.0)
Hemoglobin: 7.6 g/dL — ABNORMAL LOW (ref 13.0–17.0)
Hemoglobin: 4.6 g/dL — CL (ref 13.0–17.0)
MCH: 36 pg — ABNORMAL HIGH (ref 26.0–34.0)
MCH: 35.7 pg — ABNORMAL HIGH (ref 26.0–34.0)
MCHC: 31.7 g/dL (ref 30.0–36.0)
MCHC: 32.4 g/dL (ref 30.0–36.0)
MCV: 113.7 fL — ABNORMAL HIGH (ref 80.0–100.0)
MCV: 110.1 fL — ABNORMAL HIGH (ref 80.0–100.0)
Platelets: 84 10*3/uL — ABNORMAL LOW (ref 150–400)
Platelets: 105 10*3/uL — ABNORMAL LOW (ref 150–400)
RBC: 2.11 MIL/uL — ABNORMAL LOW (ref 4.22–5.81)
RBC: 1.29 MIL/uL — ABNORMAL LOW (ref 4.22–5.81)
RDW: 15.9 % — ABNORMAL HIGH (ref 11.5–15.5)
RDW: 15.9 % — ABNORMAL HIGH (ref 11.5–15.5)
WBC: 1.5 10*3/uL — ABNORMAL LOW (ref 4.0–10.5)
WBC: 1.8 10*3/uL — ABNORMAL LOW (ref 4.0–10.5)
nRBC: 0 % (ref 0.0–0.2)
nRBC: 0 % (ref 0.0–0.2)

## 2019-10-29 LAB — GRAM STAIN

## 2019-10-29 LAB — BODY FLUID CELL COUNT WITH DIFFERENTIAL
Lymphs, Fluid: 4 %
Monocyte-Macrophage-Serous Fluid: 10 % — ABNORMAL LOW (ref 50–90)
Neutrophil Count, Fluid: 86 % — ABNORMAL HIGH (ref 0–25)
Total Nucleated Cell Count, Fluid: 4100 cu mm — ABNORMAL HIGH (ref 0–1000)

## 2019-10-29 LAB — HEMOGLOBIN AND HEMATOCRIT, BLOOD
HCT: 19.2 % — ABNORMAL LOW (ref 39.0–52.0)
HCT: 20.9 % — ABNORMAL LOW (ref 39.0–52.0)
Hemoglobin: 6.4 g/dL — CL (ref 13.0–17.0)
Hemoglobin: 7.1 g/dL — ABNORMAL LOW (ref 13.0–17.0)

## 2019-10-29 LAB — CBG MONITORING, ED: Glucose-Capillary: 57 mg/dL — ABNORMAL LOW (ref 70–99)

## 2019-10-29 LAB — GLUCOSE, CAPILLARY
Glucose-Capillary: 75 mg/dL (ref 70–99)
Glucose-Capillary: 59 mg/dL — ABNORMAL LOW (ref 70–99)
Glucose-Capillary: 64 mg/dL — ABNORMAL LOW (ref 70–99)
Glucose-Capillary: 84 mg/dL (ref 70–99)
Glucose-Capillary: 63 mg/dL — ABNORMAL LOW (ref 70–99)
Glucose-Capillary: 83 mg/dL (ref 70–99)
Glucose-Capillary: 55 mg/dL — ABNORMAL LOW (ref 70–99)
Glucose-Capillary: 115 mg/dL — ABNORMAL HIGH (ref 70–99)
Glucose-Capillary: 93 mg/dL (ref 70–99)

## 2019-10-29 LAB — LACTIC ACID, PLASMA: Lactic Acid, Venous: 1.7 mmol/L (ref 0.5–1.9)

## 2019-10-29 LAB — PATHOLOGIST SMEAR REVIEW

## 2019-10-29 LAB — PREPARE RBC (CROSSMATCH)

## 2019-10-29 LAB — OCCULT BLOOD X 1 CARD TO LAB, STOOL: Fecal Occult Bld: POSITIVE — AB

## 2019-10-29 MED ORDER — PANTOPRAZOLE SODIUM 40 MG PO TBEC
40.0000 mg | DELAYED_RELEASE_TABLET | Freq: Every day | ORAL | Status: DC
  Administered 2019-10-29 – 2019-10-30 (×2): 40 mg via ORAL
  Filled 2019-10-29 (×2): qty 1

## 2019-10-29 MED ORDER — DARBEPOETIN ALFA 150 MCG/0.3ML IJ SOSY
150.0000 ug | PREFILLED_SYRINGE | INTRAMUSCULAR | Status: DC
  Administered 2019-10-29: 150 ug via SUBCUTANEOUS
  Filled 2019-10-29: qty 0.3

## 2019-10-29 MED ORDER — DEXTROSE 50 % IV SOLN
12.5000 g | INTRAVENOUS | Status: AC
  Administered 2019-10-29: 12.5 g via INTRAVENOUS
  Filled 2019-10-29: qty 50

## 2019-10-29 MED ORDER — SODIUM CHLORIDE 0.9 % IV SOLN
2.0000 g | INTRAVENOUS | Status: DC
  Administered 2019-10-29: 2 g via INTRAVENOUS
  Filled 2019-10-29: qty 20

## 2019-10-29 MED ORDER — SODIUM CHLORIDE 0.9 % IV SOLN
1.0000 g | INTRAVENOUS | Status: DC
  Administered 2019-10-29 – 2019-10-30 (×2): 1 g via INTRAVENOUS
  Filled 2019-10-29 (×3): qty 1

## 2019-10-29 MED ORDER — DEXTROSE 10 % IV SOLN: INTRAVENOUS | Status: DC

## 2019-10-29 MED ORDER — SODIUM CHLORIDE 0.9% IV SOLUTION: Freq: Once | INTRAVENOUS | Status: DC

## 2019-10-29 MED ORDER — VALGANCICLOVIR HCL 450 MG PO TABS
450.0000 mg | ORAL_TABLET | ORAL | Status: DC
  Administered 2019-10-30: 450 mg via ORAL
  Filled 2019-10-29 (×4): qty 1

## 2019-10-29 MED ORDER — HEPARIN 1000 UNIT/ML FOR PERITONEAL DIALYSIS
INTRAPERITONEAL | Status: DC | PRN
  Filled 2019-10-29 (×8): qty 5000

## 2019-10-29 MED ORDER — ACETAMINOPHEN 325 MG PO TABS
325.0000 mg | ORAL_TABLET | Freq: Once | ORAL | Status: AC
  Administered 2019-10-29: 325 mg via ORAL
  Filled 2019-10-29: qty 1

## 2019-10-29 MED ORDER — DEXTROSE 50 % IV SOLN
INTRAVENOUS | Status: AC
  Administered 2019-10-29: 50 mL
  Filled 2019-10-29: qty 50

## 2019-10-29 MED ORDER — DEXTROSE 50 % IV SOLN
INTRAVENOUS | Status: AC
  Administered 2019-10-29: 25 mL
  Filled 2019-10-29: qty 50

## 2019-10-29 NOTE — ED Notes (Signed)
Pt given apple juice following CBG

## 2019-10-29 NOTE — Progress Notes (Signed)
Results for JADAVION, SPOELSTRA (MRN [id number]) as of 10/29/2019 03:30  Ref. Range 10/29/2019 02:37  Hemoglobin Latest Ref Range: 13.0 - 17.0 g/dL 4.6 (LL)  Paged MD on call and new orders noted.

## 2019-10-29 NOTE — Progress Notes (Addendum)
[facility] Progress Note   Subjective:  Seen in room. Abd pain/nausea this am. Not speaking much. PD fluid 4100 TNCs.    Objective Vitals:   10/29/19 0505 10/29/19 0602 10/29/19 1002 10/29/19 1252  BP: (!) 155/85 122/62 (!) 151/78 (!) 143/84  Pulse: (!) 119 (!) 111 (!) 115 (!) 118  Resp: 20 20 18 18   Temp: 98.8 F (37.1 C) 99.9 F (37.7 C) (!) 101.4 F (38.6 C) 99.6 F (37.6 C)  TempSrc:  Oral Oral Oral  SpO2: 99% 96% 97% 94%  Weight:      Height:        Weight change:   69.8 kg  Additional Objective Labs: Basic Metabolic Panel: Recent Labs  Lab 10/28/19 1024 10/28/19 2024  NA 137  --   K 3.6  --   CL 97*  --   CO2 25  --   GLUCOSE 57*  --   BUN 31*  --   CREATININE 13.42* 12.94*  CALCIUM 8.8*  --    CBC: Recent Labs  Lab 10/28/19 1024 10/28/19 1024 10/28/19 2024 10/29/19 0237 10/29/19 0436  WBC 2.8*  --  1.5* 1.8*  --   HGB 7.7*   < > 7.6* 4.6* 6.4*  HCT 23.3*   < > 24.0* 14.2* 19.2*  MCV 111.0*  --  113.7* 110.1*  --   PLT 150  --  84* 105*  --    < > = values in this interval not displayed.   Blood Culture    Component Value Date/Time   SDES FLUID PERITONEAL DIALYSATE 10/28/2019 2140   SDES FLUID PERITONEAL DIALYSATE 10/28/2019 2140   [place] NONE 10/28/2019 2140   SPECREQUEST  10/28/2019 2140    BOTTLES DRAWN AEROBIC AND ANAEROBIC Blood Culture adequate volume   CULT  10/28/2019 2140    NO GROWTH < 12 HOURS Performed at [facility], [place] [address], [place], [place] [address]    REPTSTATUS 10/29/2019 FINAL 10/28/2019 2140   REPTSTATUS PENDING 10/28/2019 2140     Physical Exam General: Ill appearing, nad  Heart: RRR Lungs: Clear bilaterally  Abdomen: soft, diffusely tender, no rebound, no guarding  Extremities: No sig LE edema  Dialysis Access: PD cath in place  Medications: . cefTRIAXone (ROCEPHIN)  IV 2 g (10/29/19 0919)  . dialysis solution 1.5% low-MG/low-CA     . sodium chloride   Intravenous  Once  . gentamicin cream  1 application Topical Daily  . insulin aspart  0-5 Units Subcutaneous QHS  . insulin aspart  0-6 Units Subcutaneous TID WC  . vancomycin variable dose per unstable renal function (pharmacist dosing)   Does not apply See admin instructions    Dialysis Orders:  [facility]  [facility] [place]  5 cycles 2.3L  Last fill midday 1.5L  Assessment/Plan: 1. Abdominal pain/Likely SBP: CT unrevealing. PD fluid with 4100 TNCs. PD cultures pending.  Vanc/ceftaz empirically for now - if peritonitis would switch to IP abx for subsequent doses. Prior peritonitis 03/2019 was staph hemolyticus.  2. ESRD on CCPD.  Continue CCPD for now, pending culture results. All 1.5%  3. HTN/volume-  BP up some. No gross overload on exam. Follow  4. ABLA/Anemia CKD - Hgb 7.7 >4.6 overnight. S/p 1 unit prbcs. Hgb 6.4 this am. FOBT+  Last ESA 3/16 at outpatient center. Resume ESA here.  5. MBD of CKD -   Continue home binders/calcitriol when eating. Check Phos/PTH  6. Nutrition. Prot supp when eating 7. H/o  B cell lymphoma  8. Hx failed KTx   Lynnda Child PA-C [facility] Pager [phone] 10/29/2019,1:25 PM  LOS: 1 day

## 2019-10-29 NOTE — Progress Notes (Addendum)
PROGRESS NOTE    Jayen Bromwell [facility]  WCB:[id number] DOB: 06/10/1971 DOA: 10/28/2019 PCP: [name], MD   Brief Narrative:  Patient is a 49 year old male with history of end-stage renal disease status post renal transplant in 2008  , with failure in 2018 now on peritoneal dialysis, diabetes type 2, hypertension, hyperlipidemia who presented to the emergency department with 2 days complaints of abdomen pain, nausea, vomiting, diarrhea.  Also reported fever, chills.  Patient admitted for the management of possible spontaneous bacterial peritonitis.  Underwent paracentesis with confirmatory diagnosis of SBP.  Started on antibiotics.  Nephrology consulted and following.  Assessment & Plan:   Principal Problem:   Spontaneous bacterial peritonitis ([place]) Active Problems:   ESRD on peritoneal dialysis ([place])   Diabetes mellitus without complication ([place])   Hypertension   Renal transplant recipient   SBP: Underwent paracentesis with findings of SBP.  He has been febrile, tachycardic on presentation.  Will follow up peritoneal fluid culture.  Gram stain did not show any organisms.  He had history of staphylococcal hemolyticus peritonitis back in 03/2019 and was treated with vancomycin.  We will continue vancomycin and cefepime for now.  We will taper the antibiotics after we get the culture report.  He might need an intraperitoneal antibiotics  ESRD on peritoneal dialysis: Nephrology following.  He has a peritoneal  dialysis catheter.  Further management as per nephrology.Not sure weather the dialysis catheter should be removed. He has history of renal transplant which ultimately failed.  He follows with Dr. Percell Belt at [facility].  He takes prednisone, Prograf at home which are on hold due to possible underlying sepsis from SBP.  Diabetes type 2: Continue sliding scale insulin.  Follow-up hemoglobin A1c.  This morning he was hypoglycemic.  He is on insulin at home.  Hypertension: Currently blood  pressure stable.  Continue current regimen  History of peripheral vascular disease: Status post recent right BKA.  Continue current meds  Pancytopenia: Hemoglobin dropped to 6.4 today.  Being transfused with PRBC.  We will check iron studies, folate, vitamin B12. Also has thrombocytopenia and leukopenia. Positive stool occult blood.  His hemoglobin has been  running in the range of 7 since last year, most likely this is associated with his ESRD.  He does not have any active bleeding at present.  He is also not a candidate for any kind of procedure due to possible underlying sepsis, he is still febrile.  We will continue Protonix for now.  I will defer GI consultation.  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  Patient still having fever this morning.           DVT prophylaxis:ScD Code Status: Full Family Communication: None present at the bedside Disposition Plan: Patient is from home.  Not stable for discharge yet.  Discharge planning back to home when medically stable   Consultants: Nephrology  Procedures: Paracentesis  Antimicrobials:  Anti-infectives (From admission, onward)   Start     Dose/Rate Route Frequency Ordered Stop   10/28/19 1945  vancomycin (VANCOREADY) IVPB 1500 mg/300 mL     1,500 mg 150 mL/hr over 120 Minutes Intravenous  Once 10/28/19 1944 10/29/19 0132   10/28/19 1930  vancomycin (VANCOCIN) 1,500 mg in sodium chloride 0.9 % 500 mL IVPB  Status:  Discontinued     1,500 mg 250 mL/hr over 120 Minutes Intravenous  Once 10/28/19 1922 10/28/19 1943   10/28/19 1930  cefTAZidime (  FORTAZ) 1 g in sodium chloride 0.9 % 100 mL IVPB  Status:  Discontinued     1 g 200 mL/hr over 30 Minutes Intravenous Every 24 hours 10/28/19 1922 10/29/19 0321   10/28/19 1922  vancomycin variable dose per unstable renal function (pharmacist dosing)      Does not apply See admin instructions  10/28/19 1922        Subjective: Patient seen and examined at the bedside this morning.  Lying on the bed.  Looks weak, chronically looking.  He says he is tired and wants to sleep.  Complains of abdominal discomfort  Objective: Vitals:   10/29/19 0317 10/29/19 0414 10/29/19 0505 10/29/19 0602  BP: (!) 182/84 (!) 166/84 (!) 155/85 122/62  Pulse: (!) 124 (!) 117 (!) 119 (!) 111  Resp: (!) 26 (!) 22 20 20   Temp: (!) 101.8 F (38.8 C) (!) 101.6 F (38.7 C) 98.8 F (37.1 C) 99.9 F (37.7 C)  TempSrc: Oral Oral  Oral  SpO2: 94% 97% 99% 96%  Weight: 69.8 kg     Height:        Intake/Output Summary (Last 24 hours) at 10/29/2019 0759 Last data filed at 10/29/2019 0600 Gross per 24 hour  Intake 460 ml  Output 0 ml  Net 460 ml   Filed Weights   10/28/19 1600 10/29/19 0317  Weight: 68 kg 69.8 kg    Examination:  General exam: Generalized weakness, chronically ill looking  HEENT:PERRL,Oral mucosa moist, Ear/Nose normal on gross exam Respiratory system: Bilateral equal air entry, normal vesicular breath sounds, no wheezes or crackles  Cardiovascular system: S1 & S2 heard, RRR. No JVD, murmurs, rubs, gallops or clicks. No pedal edema. Gastrointestinal system: Abdomen is nondistended, soft and has generalized tenderness. No organomegaly or masses felt. Normal bowel sounds heard.  Peritoneal dialysis catheter Central nervous system: Alert and oriented. No focal neurological deficits. Extremities: No edema, no clubbing ,no cyanosis, right BKA  skin: No rashes, lesions or ulcers,no icterus ,no pallor    Data Reviewed: I have personally reviewed following labs and imaging studies  CBC: Recent Labs  Lab 10/28/19 1024 10/28/19 2024 10/29/19 0237 10/29/19 0436  WBC 2.8* 1.5* 1.8*  --   HGB 7.7* 7.6* 4.6* 6.4*  HCT 23.3* 24.0* 14.2* 19.2*  MCV 111.0* 113.7* 110.1*  --   PLT 150 84* 105*  --    Basic Metabolic Panel: Recent Labs  Lab 10/28/19 1024 10/28/19 2024  NA 137  --    K 3.6  --   CL 97*  --   CO2 25  --   GLUCOSE 57*  --   BUN 31*  --   CREATININE 13.42* 12.94*  CALCIUM 8.8*  --    GFR: Estimated Creatinine Clearance: 6.4 mL/min (A) (by C-G formula based on SCr of 12.94 mg/dL (H)). Liver Function Tests: Recent Labs  Lab 10/28/19 1024  AST 24  ALT 20  ALKPHOS 89  BILITOT 0.5  PROT 7.1  ALBUMIN 2.5*   Recent Labs  Lab 10/28/19 1024  LIPASE 24   No results for input(s): AMMONIA in the last 168 hours. Coagulation Profile: No results for input(s): INR, PROTIME in the last 168 hours. Cardiac Enzymes: No results for input(s): CKTOTAL, CKMB, CKMBINDEX, TROPONINI in the last 168 hours. BNP (last 3 results) No results for input(s): PROBNP in the last 8760 hours. HbA1C: No results for input(s): HGBA1C in the last 72 hours. CBG: Recent Labs  Lab 10/28/19 2301 10/29/19 0235 10/29/19 0710  GLUCAP 68* 57* 59*   Lipid Profile: No results for input(s): CHOL, HDL, LDLCALC, TRIG, CHOLHDL, LDLDIRECT in the last 72 hours. Thyroid Function Tests: No results for input(s): TSH, T4TOTAL, FREET4, T3FREE, THYROIDAB in the last 72 hours. Anemia Panel: No results for input(s): VITAMINB12, FOLATE, FERRITIN, TIBC, IRON, RETICCTPCT in the last 72 hours. Sepsis Labs: Recent Labs  Lab 10/28/19 2042 10/29/19 0436  LATICACIDVEN 1.5 1.7    Recent Results (from the past 240 hour(s))  SARS CORONAVIRUS 2 (TAT 6-24 HRS) Nasopharyngeal Nasopharyngeal Swab     Status: None   Collection Time: 10/28/19  6:27 PM   Specimen: Nasopharyngeal Swab  Result Value Ref Range Status   SARS Coronavirus 2 NEGATIVE NEGATIVE Final    Comment: (NOTE) SARS-CoV-2 target nucleic acids are NOT DETECTED. The SARS-CoV-2 RNA is generally detectable in upper and lower respiratory specimens during the acute phase of infection. Negative results do not preclude SARS-CoV-2 infection, do not rule out co-infections with other pathogens, and should not be used as the sole basis for  treatment or other patient management decisions. Negative results must be combined with clinical observations, patient history, and epidemiological information. The expected result is Negative. Fact Sheet for Patients: SugarRoll.be Fact Sheet for Healthcare Providers: https://www.woods-mathews.com/ This test is not yet approved or cleared by the [place] FDA and  has been authorized for detection and/or diagnosis of SARS-CoV-2 by FDA under an Emergency Use Authorization (EUA). This EUA will remain  in effect (meaning this test can be used) for the duration of the COVID-19 declaration under Section 56 4(b)(1) of the Act, 21 U.S.C. section 360bbb-3(b)(1), unless the authorization is terminated or revoked sooner. Performed at [facility], [place] [address] [place], [place] [address]   Gram stain     Status: None   Collection Time: 10/28/19  9:40 PM   Specimen: Body Fluid  Result Value Ref Range Status   Specimen Description FLUID PERITONEAL DIALYSATE  Final   Special Requests NONE  Final   Gram Stain   Final    MODERATE WBC PRESENT, PREDOMINANTLY PMN NO ORGANISMS SEEN Performed at [facility], [address], [place], [place] [address]    Report Status 10/29/2019 FINAL  Final  Culture, body fluid-bottle     Status: None (Preliminary result)   Collection Time: 10/28/19  9:40 PM   Specimen: Fluid  Result Value Ref Range Status   Specimen Description FLUID PERITONEAL DIALYSATE  Final   Special Requests   Final    BOTTLES DRAWN AEROBIC AND ANAEROBIC Blood Culture adequate volume   Culture   Final    NO GROWTH < 12 HOURS Performed at [facility], [place] [address] [place], [place] [address]    Report Status PENDING  Incomplete         Radiology Studies: CT ABDOMEN PELVIS WO CONTRAST  Result Date: 10/28/2019 CLINICAL DATA:  Acute generalized abdominal pain, nausea and vomiting. EXAM: CT ABDOMEN AND PELVIS WITHOUT  CONTRAST TECHNIQUE: Multidetector CT imaging of the abdomen and pelvis was performed following the standard protocol without IV contrast. COMPARISON:  03/14/2019 CT abdomen/pelvis. FINDINGS: Lower chest: No significant pulmonary nodules or acute consolidative airspace disease. Low cardiac blood pool density suggests anemia. Suggestion of right coronary atherosclerosis. Hepatobiliary: Normal liver size. Simple scattered small right liver cysts, largest 1.2 cm. A few scattered subcentimeter hypodense liver lesions are too small to characterize and are unchanged and presumably benign. No new liver lesions. Cholecystectomy no biliary ductal  dilatation. Pancreas: Normal, with no mass or duct dilation. Spleen: Normal size. No mass. Adrenals/Urinary Tract: Normal adrenals. Severe symmetric bilateral renal atrophy, unchanged. No hydronephrosis. No renal stones. Simple exophytic 1.7 cm posterior lower left renal cyst. No additional contour deforming renal lesions. Right lower quadrant renal transplant demonstrates no hydronephrosis, no renal stones and no contour deforming renal masses. Nondistended bladder obscured by streak artifact from right hip hardware with no gross bladder abnormality. Stomach/Bowel: Normal non-distended stomach. Normal caliber small bowel with no small bowel wall thickening. Candidate normal appendix. Scattered mild colonic diverticulosis with no large bowel wall thickening. Vascular/Lymphatic: Atherosclerotic nonaneurysmal abdominal aorta. Newly enlarged 1.3 cm right external iliac node (series 3/image 74). No additional pathologically enlarged lymph nodes in the abdomen or pelvis. Reproductive: Normal size prostate. Other: No pneumoperitoneum. Percutaneous dialysis catheter terminates in the anterior right lower peritoneal cavity. Small volume ascites with no focal fluid collection. Musculoskeletal: No aggressive appearing focal osseous lesions. Right total hip arthroplasty. IMPRESSION: 1. Newly  enlarged right external iliac lymph node, nonspecific. Suggest attention on follow-up CT abdomen/pelvis in 3 months. This recommendation follows ACR consensus guidelines: White Paper of the ACR Incidental Findings Committee II on Splenic and Nodal Findings. J Am Coll Radiol 2013;10:789-794. 2. Small volume ascites. Percutaneous dialysis catheter terminates in the anterior right lower peritoneal cavity. 3. No evidence of bowel obstruction or acute bowel inflammation. Scattered mild colonic diverticulosis with no evidence of acute diverticulitis. 4. Severe symmetric bilateral native renal atrophy. Right lower quadrant renal transplant appears unremarkable. 5. Low cardiac blood pool density suggests anemia. 6. Aortic Atherosclerosis (ICD10-I70.0). Electronically Signed   By: Ilona Sorrel M.D.   On: 10/28/2019 15:50        Scheduled Meds: . sodium chloride   Intravenous Once  . gentamicin cream  1 application Topical Daily  . insulin aspart  0-5 Units Subcutaneous QHS  . insulin aspart  0-6 Units Subcutaneous TID WC  . vancomycin variable dose per unstable renal function (pharmacist dosing)   Does not apply See admin instructions   Continuous Infusions: . dialysis solution 1.5% low-MG/low-CA       LOS: 1 day    Time spent:35 mins. More than 50% of that time was spent in counseling and/or coordination of care.      Shelly Coss, MD [facility] P4/02/2020, 7:59 AM

## 2019-10-29 NOTE — Progress Notes (Signed)
Called ER RN regarding Temp,  CBG and Respiration. Not in parameter to recheck temp.Patient asleep (shallow resp) and will recheck CBG before he goes up to the floor.

## 2019-10-29 NOTE — Progress Notes (Signed)
Called regarding critical Hgb of 4.6, down from 7.6 in ED. Patient has not seen any bleeding. Informed consent obtained for transfusion.   Plan to repeat H&H now given unexplained drop, and then transfuse RBC if <7.

## 2019-10-29 NOTE — Progress Notes (Signed)
CBG: 59  Treatment: 4 oz juice/soda  Symptoms: None  Follow-up CBG: Time:0802 CBG Result:64  Possible Reasons for Event: Inadequate meal intake   Treatment: D50 50 mL (25 gm)  Symptoms: None  Follow-up CBG: ASTM:1962 CBG Result:1219  Possible Reasons for Event: Inadequate meal intake  Comments/MD notified: Dr. Jonnie Finner at bedside - may implement D10 at 40 ml/hr post transfusion and IV antibiotics Patient will need second IV  Will continue to monitor closely  Dorthey Sawyer

## 2019-10-29 NOTE — Progress Notes (Signed)
CBG: 55  Treatment: D50 25 mL (12.5 gm)  Symptoms: None  Follow-up CBG: AREQ:1483 CBG Result:115  Possible Reasons for Event: Inadequate meal intake  Comments/MD notified: Second IV initiated, D10 infusing   Laurance Flatten, Roberts Gaudy

## 2019-10-29 NOTE — ED Notes (Signed)
rn notified of temp and blood sugar

## 2019-10-29 NOTE — Progress Notes (Signed)
New Admission Note:  Arrival Method: Stretcher Mental Orientation: Alert and oriented x 3-4 Telemetry: None Assessment: Completed Skin: Warm and dry  IV: NSL Pain: 5/10 Tubes: PD cath  Safety Measures: Safety Fall Prevention Plan initiated.  Admission: Completed 5 M  Orientation: Patient has been orientated to the room, unit and the staff. Welcome booklet given.  Family: None  Orders have been reviewed and implemented. Will continue to monitor the patient. Call light has been placed within reach and bed alarm has been activated.   Sima Matas BSN, RN  Phone Number: [phone]

## 2019-10-29 NOTE — Progress Notes (Signed)
Updated HD RN of patient orders.

## 2019-10-29 NOTE — Progress Notes (Signed)
Pharmacy Antibiotic Note  James [name] is a 49 y.o. male admitted on 10/28/2019 with SBP.  Pharmacy has been consulted for Vanco/Cefepime dosing.   ID: SBP confirmed on paracentesis.Marland Kitchen LA WNL. WBC only 1.8, Tmax 101.8. f/u for need to transition to IP abx. Prior peritonitis 03/2019 was staph hemolyticus.  Ceftaz 4/7 x 1 Rocephin 4/8 x 1 Cefepime 4/8>> Vanc 4/7 >> Valcyte (for EBV)  4/7: Peritoneal fluid Cx: (negative gram stain)>>  Plan: Start Cefepime 1g IV q24h Vancomycin 1500mg  IV once (4/7 2315) Check random vancomycin level in 3 days (4/11 AM) and redose PRN    Height: 5\' 6"  (167.6 cm) Weight: 69.8 kg (153 lb 14.1 oz) IBW/kg (Calculated) : 63.8  Temp (24hrs), Avg:100.2 F (37.9 C), Min:98.6 F (37 C), Max:101.8 F (38.8 C)  Recent Labs  Lab 10/28/19 1024 10/28/19 2024 10/28/19 2042 10/29/19 0237 10/29/19 0436  WBC 2.8* 1.5*  --  1.8*  --   CREATININE 13.42* 12.94*  --   --   --   LATICACIDVEN  --   --  1.5  --  1.7    Estimated Creatinine Clearance: 6.4 mL/min (A) (by C-G formula based on SCr of 12.94 mg/dL (H)).    Allergies  Allergen Reactions  . Tramadol Hcl Shortness Of Breath and Other (See Comments)    Tramadol HCl = Shortness of breath and Tramadol = Rash  . Nsaids Other (See Comments)    Was told by MD to not take these  . Darvon [Propoxyphene] Rash and Other (See Comments)    Broke out on eyelid   . Tramadol Rash and Other (See Comments)    Tramadol = Rash and Tramadol HCl = Shortness of breath    [name], PharmD, BCPS Clinical Staff Pharmacist [email or web address] [name] 10/29/2019 1:57 PM

## 2019-10-29 NOTE — ED Notes (Signed)
Orange juice given for blood sugar. 100.7 oral temp

## 2019-10-29 NOTE — Progress Notes (Signed)
CBG: 63  Treatment: D50 25 mL (12.5 gm)  Symptoms: None  Follow-up CBG: Time:1219 CBG Result:83  Possible Reasons for Event: Inadequate meal intake  Comments: Patient not eating, difficult IV restart for second IV to infuse D10. Will continue to monitor.  James [name]

## 2019-10-30 DIAGNOSIS — N185 Chronic kidney disease, stage 5: Secondary | ICD-10-CM

## 2019-10-30 DIAGNOSIS — K652 Spontaneous bacterial peritonitis: Secondary | ICD-10-CM | POA: Diagnosis not present

## 2019-10-30 LAB — CBC WITH DIFFERENTIAL/PLATELET
Abs Immature Granulocytes: 0.04 10*3/uL (ref 0.00–0.07)
Basophils Absolute: 0 10*3/uL (ref 0.0–0.1)
Basophils Relative: 1 %
Eosinophils Absolute: 0 10*3/uL (ref 0.0–0.5)
Eosinophils Relative: 1 %
HCT: 21.8 % — ABNORMAL LOW (ref 39.0–52.0)
Hemoglobin: 7.5 g/dL — ABNORMAL LOW (ref 13.0–17.0)
Immature Granulocytes: 2 %
Lymphocytes Relative: 35 %
Lymphs Abs: 0.7 10*3/uL (ref 0.7–4.0)
MCH: 35 pg — ABNORMAL HIGH (ref 26.0–34.0)
MCHC: 34.4 g/dL (ref 30.0–36.0)
MCV: 101.9 fL — ABNORMAL HIGH (ref 80.0–100.0)
Monocytes Absolute: 0.2 10*3/uL (ref 0.1–1.0)
Monocytes Relative: 9 %
Neutro Abs: 1.1 10*3/uL — ABNORMAL LOW (ref 1.7–7.7)
Neutrophils Relative %: 52 %
Platelets: 103 10*3/uL — ABNORMAL LOW (ref 150–400)
RBC: 2.14 MIL/uL — ABNORMAL LOW (ref 4.22–5.81)
RDW: 18.7 % — ABNORMAL HIGH (ref 11.5–15.5)
WBC: 2.1 10*3/uL — ABNORMAL LOW (ref 4.0–10.5)
nRBC: 0 % (ref 0.0–0.2)

## 2019-10-30 LAB — GLUCOSE, CAPILLARY
Glucose-Capillary: 73 mg/dL (ref 70–99)
Glucose-Capillary: 79 mg/dL (ref 70–99)
Glucose-Capillary: 92 mg/dL (ref 70–99)

## 2019-10-30 LAB — FERRITIN: Ferritin: 4871 ng/mL — ABNORMAL HIGH (ref 24–336)

## 2019-10-30 LAB — BASIC METABOLIC PANEL
Anion gap: 15 (ref 5–15)
BUN: 38 mg/dL — ABNORMAL HIGH (ref 6–20)
CO2: 26 mmol/L (ref 22–32)
Calcium: 8.5 mg/dL — ABNORMAL LOW (ref 8.9–10.3)
Chloride: 94 mmol/L — ABNORMAL LOW (ref 98–111)
Creatinine, Ser: 13.29 mg/dL — ABNORMAL HIGH (ref 0.61–1.24)
GFR calc Af Amer: 5 mL/min — ABNORMAL LOW (ref >60)
GFR calc non Af Amer: 4 mL/min — ABNORMAL LOW (ref >60)
Glucose, Bld: 82 mg/dL (ref 70–99)
Potassium: 4 mmol/L (ref 3.5–5.1)
Sodium: 135 mmol/L (ref 135–145)

## 2019-10-30 LAB — IRON AND TIBC
Iron: 64 ug/dL (ref 45–182)
Saturation Ratios: 54 % — ABNORMAL HIGH (ref 17.9–39.5)
TIBC: 119 ug/dL — ABNORMAL LOW (ref 250–450)
UIBC: 55 ug/dL

## 2019-10-30 LAB — C DIFFICILE QUICK SCREEN W PCR REFLEX
C Diff antigen: POSITIVE — AB
C Diff toxin: NEGATIVE

## 2019-10-30 LAB — VITAMIN B12: Vitamin B-12: 1414 pg/mL — ABNORMAL HIGH (ref 180–914)

## 2019-10-30 LAB — PHOSPHORUS: Phosphorus: 5.3 mg/dL — ABNORMAL HIGH (ref 2.5–4.6)

## 2019-10-30 LAB — CLOSTRIDIUM DIFFICILE BY PCR, REFLEXED: Toxigenic C. Difficile by PCR: POSITIVE — AB

## 2019-10-30 LAB — GLUCOSE, BODY FLUID OTHER: Glucose, Body Fluid Other: 849 mg/dL

## 2019-10-30 LAB — HEMOGLOBIN A1C

## 2019-10-30 MED ORDER — OXYCODONE-ACETAMINOPHEN 5-325 MG PO TABS
1.0000 | ORAL_TABLET | ORAL | Status: DC | PRN
  Administered 2019-10-30 – 2019-11-02 (×6): 1 via ORAL
  Filled 2019-10-30 (×6): qty 1

## 2019-10-30 MED ORDER — OXYCODONE HCL 5 MG PO TABS
2.5000 mg | ORAL_TABLET | ORAL | Status: DC | PRN
  Administered 2019-10-30 – 2019-11-02 (×6): 2.5 mg via ORAL
  Filled 2019-10-30 (×6): qty 1

## 2019-10-30 MED ORDER — OXYCODONE-ACETAMINOPHEN 7.5-325 MG PO TABS: 1.0000 | ORAL_TABLET | ORAL | Status: DC | PRN

## 2019-10-30 MED ORDER — MORPHINE SULFATE (PF) 2 MG/ML IV SOLN: 2.0000 mg | INTRAVENOUS | Status: DC | PRN

## 2019-10-30 MED ORDER — HYDROMORPHONE HCL 1 MG/ML IJ SOLN
0.5000 mg | INTRAMUSCULAR | Status: DC | PRN
  Administered 2019-10-30 – 2019-11-04 (×4): 0.5 mg via INTRAVENOUS
  Filled 2019-10-30 (×4): qty 1

## 2019-10-30 NOTE — Consult Note (Signed)
Hospital Consult    Reason for Consult: Permanent dialysis access Referring Physician: Dr. Jonnie Finner MRN #:  [id number]  History of Present Illness: This is a 49 y.o. male with history of end-stage renal disease most recently on peritoneal dialysis but now with 3 episodes of spontaneous bacterial peritonitis.  Has previous left upper arm AV fistula which worked for multiple years.  Most recently had fistula created in the right upper extremity at [facility] but this was never usable.  He is now admitted and will need catheter and permanent access.  He is right-hand dominant.  Past Medical History:  Diagnosis Date  . Anemia   . Diabetes mellitus without complication ([place])   . Hypertension   . Influenza A   . Pneumonia 04/21/2017  . Renal disorder   . 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 He then got HD in [facility] until getting a renal transplant in 2009 at [place] [place].  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    . Smoker   . Tachycardia   . Transplant recipient     Past Surgical History:  Procedure Laterality Date  . BIOPSY  03/31/2019   Procedure: BIOPSY;  Surgeon: Clarene Essex, MD;  Location: [place];  Service: Endoscopy;;  . CHOLECYSTECTOMY    . COLONOSCOPY WITH PROPOFOL N/A 03/31/2019   Procedure: COLONOSCOPY WITH PROPOFOL;  Surgeon: Clarene Essex, MD;  Location: [place];  Service: Endoscopy;  Laterality: N/A;  . HIP SURGERY    . INSERTION OF DIALYSIS CATHETER Left 04/24/2017   Procedure: EXCHANGE  OF DIALYSIS CATHETER;  Surgeon: Rosetta Posner, MD;  Location: [place];  Service: Vascular;  Laterality: Left;  . KIDNEY TRANSPLANT    . PERITONEAL CATHETER INSERTION Left 08/2017  . POLYPECTOMY  03/31/2019   Procedure:  POLYPECTOMY;  Surgeon: Clarene Essex, MD;  Location: [facility] ENDOSCOPY;  Service: Endoscopy;;    Allergies  Allergen Reactions  . Tramadol Hcl Shortness Of Breath and Other (See Comments)    Tramadol HCl = Shortness of breath and Tramadol = Rash  . Nsaids Other (See Comments)    Was told by MD to not take these  . Darvon [Propoxyphene] Rash and Other (See Comments)    Broke out on eyelid   . Tramadol Rash and Other (See Comments)    Tramadol = Rash and Tramadol HCl = Shortness of breath    Prior to Admission medications   Medication Sig Start Date End Date Taking? Authorizing Provider  acetaminophen (TYLENOL) 500 MG tablet Take 500-1,000 mg by mouth every 6 (six) hours as needed (pain or headaches).    Yes [provider]  ASPIRIN LOW DOSE 81 MG EC tablet Take 81 mg by mouth daily. 09/18/19  Yes [provider]  calcitRIOL (ROCALTROL) 0.25 MCG capsule Take 0.75 mcg by mouth daily.   Yes [provider]  Cyanocobalamin (B-12) 500 MCG TABS Take 1 tablet by mouth daily. 08/12/19  Yes [provider]  diphenhydrAMINE (BENADRYL) 25 mg capsule Take 25 mg by mouth every 6 (six) hours as needed for itching.   Yes [provider]  doxazosin (CARDURA) 4 MG tablet Take 4 mg by  mouth at bedtime. 12/25/18  Yes [provider]  ferric citrate (AURYXIA) 1 GM 210 MG(Fe) tablet Take 210-420 mg by mouth See admin instructions. Take 210-420 mg by mouth one to three times a day with meals and 210 mg with each snack   Yes [provider]  ferrous sulfate 325 (65 FE) MG tablet Take 325 mg by mouth 2 (two) times daily with a meal.   Yes [provider]  folic acid (FOLVITE) 1 MG tablet Take 1 mg by mouth daily. 08/12/19  Yes [provider]  gabapentin (NEURONTIN) 300 MG capsule Take 300 mg by mouth at bedtime.  08/06/19  Yes [provider]  insulin aspart (NOVOLOG FLEXPEN) 100 UNIT/ML FlexPen Inject 10 Units into the skin 3 (three)  times daily with meals. Patient taking differently: Inject 2-10 Units into the skin See admin instructions. Inject 2-10 units into the skin three times a day with meals, per sliding scale 04/25/17  Yes Mariel Aloe, MD  Insulin Detemir (LEVEMIR FLEXTOUCH) 100 UNIT/ML Pen Inject 15 Units into the skin at bedtime. Patient taking differently: Inject 15 Units into the skin every other day.  09/23/17  Yes Regalado, Belkys A, MD  latanoprost (XALATAN) 0.005 % ophthalmic solution Place 1 drop into both eyes at bedtime.   Yes [provider]  lisinopril (ZESTRIL) 20 MG tablet Take 20 mg by mouth daily. 09/18/19  Yes [provider]  loperamide (IMODIUM) 2 MG capsule Take 2 mg by mouth as needed for diarrhea or loose stools.    Yes [provider]  magnesium oxide (MAG-OX) 400 MG tablet Take by mouth daily. 10/13/19  Yes [provider]  Melatonin 5 MG CAPS Take 5 mg by mouth at bedtime.    Yes [provider]  metoprolol tartrate (LOPRESSOR) 100 MG tablet Take 100 mg by mouth 2 (two) times daily. 10/13/19  Yes [provider]  Multiple Vitamins-Minerals (CENTRUM MEN) TABS Take 1 tablet by mouth 2 (two) times daily.   Yes [provider]  omeprazole (PRILOSEC) 20 MG capsule Take 20 mg by mouth daily.   Yes [provider]  potassium chloride SA (K-DUR) 20 MEQ tablet Take 20 mEq by mouth every Monday, Wednesday, and Friday. 10/15/18  Yes [provider]  predniSONE (DELTASONE) 5 MG tablet Take 5 mg by mouth daily with breakfast.   Yes [provider]  sodium bicarbonate 650 MG tablet Take 1,300 mg by mouth 3 (three) times daily.    Yes [provider]  tacrolimus (PROGRAF) 0.5 MG capsule Take 0.5 mg by mouth daily.    Yes [provider]  temazepam (RESTORIL) 15 MG capsule Take 15 mg by mouth at bedtime. 05/21/19  Yes [provider]  terazosin (HYTRIN) 1 MG capsule Take 1 mg by mouth at bedtime.  09/18/19  Yes [provider]  valGANciclovir (VALCYTE) 450 MG tablet Take 450 mg by mouth See admin instructions. Take one tablet (450 mg) by mouth on Monday, Wednesday, Friday before dialysis 08/22/17  Yes [provider]  warfarin (COUMADIN) 5 MG tablet Take 5-7.5 mg by mouth See admin instructions. 5mg  every day except 7.5mg  on Tuesday and Thursday 10/11/19  Yes [provider]  zolpidem (AMBIEN) 10 MG tablet Take 10 mg by mouth at bedtime as needed for sleep.    Yes [provider]    Social History   Socioeconomic History  . Marital status: Single    Spouse name: Not on file  .  Number of children: Not on file  . Years of education: Not on file  . Highest education level: Not on file  Occupational History  . Not on file  Tobacco Use  . Smoking status: Former Smoker    Packs/day: 0.20  . Smokeless tobacco: Never Used  Substance and Sexual Activity  . Alcohol use: No  . Drug use: No  . Sexual activity: Not on file  Other Topics Concern  . Not on file  Social History Narrative  . Not on file   Social Determinants of Health   Financial Resource Strain:   . Difficulty of Paying Living Expenses:   Food Insecurity:   . Worried About Charity fundraiser in the Last Year:   . Arboriculturist in the Last Year:   Transportation Needs:   . Film/video editor (Medical):   Marland Kitchen Lack of Transportation (Non-Medical):   Physical Activity:   . Days of Exercise per Week:   . Minutes of Exercise per Session:   Stress:   . Feeling of Stress :   Social Connections:   . Frequency of Communication with Friends and Family:   . Frequency of Social Gatherings with Friends and Family:   . Attends Religious Services:   . Active Member of Clubs or Organizations:   . Attends Archivist Meetings:   Marland Kitchen Marital Status:   Intimate Partner Violence:   . Fear of Current or Ex-Partner:   . Emotionally Abused:   Marland Kitchen Physically Abused:   . Sexually Abused:      Family History  Adopted: Yes    ROS: Cardiovascular: []  chest pain/pressure []  palpitations []  SOB lying flat []  DOE []  pain in legs while walking []  pain in legs at rest []  pain in legs at night []  non-healing ulcers []  hx of DVT []  swelling in legs  Pulmonary: []  productive cough []  asthma/wheezing []  home O2  Neurologic: []  weakness in []  arms []  legs []  numbness in []  arms []  legs []  hx of CVA []  mini stroke [] difficulty speaking or slurred speech []  temporary loss of vision in one eye []  dizziness  Hematologic: []  hx of cancer []  bleeding problems []  problems with blood clotting easily  Endocrine:   []  diabetes []  thyroid disease  GI [x]  abdominal pain []  blood in stool  GU: []  CKD/renal failure []  HD--[]  M/W/F or []  T/T/S []  burning with urination []  blood in urine  Psychiatric: []  anxiety []  depression  Musculoskeletal: []  arthritis []  joint pain  Integumentary: []  rashes []  ulcers  Constitutional: []  fever []  chills   Physical Examination  Vitals:   10/30/19 1853 10/30/19 1915  BP: (!) 157/93 (!) 146/89  Pulse: (!) 102 98  Resp: 18 18  Temp: 98.4 F (36.9 C) 98.4 F (36.9 C)  SpO2: 96% 96%   Body mass index is 24.84 kg/m.  General:  nad HENT: WNL, normocephalic Pulmonary: normal non-labored breathing Cardiac: palpable bilateral brachial and radial pulses Extremities: large thrombosed left upper arm avf Neurologic: A&O X 3; Appropriate Affect ; SENSATION: normal; MOTOR FUNCTION:  moving all extremities equally. Speech is fluent/normal  CBC    Component Value Date/Time   WBC 2.1 (L) 10/30/2019 0536   RBC 2.14 (L) 10/30/2019 0536   HGB 7.5 (L) 10/30/2019 0536   HCT 21.8 (L) 10/30/2019 0536   PLT 103 (L) 10/30/2019 0536   MCV 101.9 (H) 10/30/2019 0536   MCH 35.0 (H) 10/30/2019 0536   MCHC 34.4  10/30/2019 0536   RDW 18.7 (H) 10/30/2019 0536   LYMPHSABS 0.7 10/30/2019 0536   MONOABS 0.2 10/30/2019 0536   EOSABS  0.0 10/30/2019 0536   BASOSABS 0.0 10/30/2019 0536    BMET    Component Value Date/Time   NA 135 10/30/2019 0536   K 4.0 10/30/2019 0536   CL 94 (L) 10/30/2019 0536   CO2 26 10/30/2019 0536   GLUCOSE 82 10/30/2019 0536   BUN 38 (H) 10/30/2019 0536   CREATININE 13.29 (H) 10/30/2019 0536   CALCIUM 8.5 (L) 10/30/2019 0536   GFRNONAA 4 (L) 10/30/2019 0536   GFRAA 5 (L) 10/30/2019 0536    COAGS: Lab Results  Component Value Date   INR 1.0 03/14/2019   INR 1.07 04/24/2017   INR 1.03 04/19/2017     Non-Invasive Vascular Imaging:   Vein mapping ordered    ASSESSMENT/PLAN: This is a 49 y.o. male with end-stage renal disease most recently on peritoneal dialysis but with multiple episodes of peritonitis.  He is now indicated for hemodialysis.  He will need catheter and fistula versus graft.  We will get vein mapping and discuss our options.  Certainly would like to decrease his risk of infection allow the infection to clear prior to proceeding with catheter.  Patient has very good understanding of dialysis access all questions were answered.  James [name] C. Donzetta Matters, MD [facility] Office: [phone] Pager: [phone]

## 2019-10-30 NOTE — Progress Notes (Signed)
PROGRESS NOTE    [facility]  JOA:[id number] DOB: 04/05/1971 DOA: 10/28/2019 PCP: [name], MD   Brief Narrative:  Patient is a 49 year old male with history of end-stage renal disease status post renal transplant in 2008  , with failure in 2018 now on peritoneal dialysis, diabetes type 2, hypertension, hyperlipidemia who presented to the emergency department with 2 days complaints of abdomen pain, nausea, vomiting, diarrhea.  Also reported fever, chills.  Patient admitted for the management of possible spontaneous bacterial peritonitis.  Underwent paracentesis with confirmatory diagnosis of SBP.  Started on antibiotics.  Nephrology consulted and following.  Assessment & Plan:   Principal Problem:   Spontaneous bacterial peritonitis ([place]) Active Problems:   ESRD on peritoneal dialysis ([place])   Diabetes mellitus without complication ([place])   Hypertension   Renal transplant recipient   SBP: Underwent paracentesis with findings of SBP.  He has been febrile, tachycardic on presentation.  Will follow up peritoneal fluid culture,blood culture.  Gram stain did not show any organisms.  He had history of staphylococcal hemolyticus peritonitis back in 03/2019 and was treated with vancomycin.  We will continue vancomycin and cefepime for now.  We will taper the antibiotics after we get the culture report.  He might need an intraperitoneal antibiotics  Abdominal pain/diarrhea: We will check C. difficile/GI pathogen panel.  Complains of abdominal pain.  CT abdomen/pelvis did not show any signs of bowel inflammation.Found to have Newly enlarged right external iliac lymph node, nonspecific  ESRD on peritoneal dialysis: Nephrology following.  He has a peritoneal  dialysis catheter.He has history of renal transplant which ultimately failed.  He follows with Dr. Percell Belt at [facility].  He takes prednisone, Prograf at home which are on hold due to possible underlying sepsis from SBP. Patient wants to stop  peritoneal dialysis and go for hemodialysis now.  Diabetes type 2: Continue sliding scale insulin.    He is on insulin at home.  Hypertension: Currently blood pressure stable.  Continue current regimen  History of peripheral vascular disease: Status post recent right BKA.  Continue current meds  Pancytopenia: Hemoglobin dropped to 6.4, transfused with 1 unit of PRBC.  Normal iron, high ferritin, high vitamin B12.  Also has thrombocytopenia and leukopenia. Positive stool occult blood.  His hemoglobin has been  running in the range of 7 since last year, most likely this is associated with his ESRD.  He does not have any active bleeding at present.  He is also not a candidate for any kind of procedure due to possible underlying sepsis,also having diarrhoea.  We will continue Protonix for now.  I will defer GI consultation.  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           DVT prophylaxis:ScD Code Status: Full Family Communication: None present at the bedside Disposition Plan: Patient is from home.  Not stable for discharge yet because cultures are pending, he might need tunnel catheter placement for hemodialysis.  Discharge planning back to home when medically stable   Consultants: Nephrology  Procedures: Paracentesis  Antimicrobials:  Anti-infectives (From admission, onward)   Start     Dose/Rate Route Frequency Ordered Stop   10/30/19 1000  valGANciclovir (VALCYTE) 450 MG tablet TABS 450 mg    Note to Pharmacy: Take one tablet (450 mg) by mouth on Monday, Wednesday, Friday before dialysis     450 mg Oral Every M-W-F 10/29/19 1332     10/29/19 1500  ceFEPIme (MAXIPIME) 1 g in sodium chloride 0.9 % 100 mL IVPB     1 g 200 mL/hr over 30 Minutes Intravenous Every 24 hours 10/29/19 1350     10/29/19 0900  cefTRIAXone (ROCEPHIN) 2 g in sodium chloride 0.9 % 100 mL IVPB  Status:  Discontinued     2 g 200 mL/hr over 30  Minutes Intravenous Every 24 hours 10/29/19 0809 10/29/19 1332   10/28/19 1945  vancomycin (VANCOREADY) IVPB 1500 mg/300 mL     1,500 mg 150 mL/hr over 120 Minutes Intravenous  Once 10/28/19 1944 10/29/19 0132   10/28/19 1930  vancomycin (VANCOCIN) 1,500 mg in sodium chloride 0.9 % 500 mL IVPB  Status:  Discontinued     1,500 mg 250 mL/hr over 120 Minutes Intravenous  Once 10/28/19 1922 10/28/19 1943   10/28/19 1930  cefTAZidime (FORTAZ) 1 g in sodium chloride 0.9 % 100 mL IVPB  Status:  Discontinued     1 g 200 mL/hr over 30 Minutes Intravenous Every 24 hours 10/28/19 1922 10/29/19 0321   10/28/19 1922  vancomycin variable dose per unstable renal function (pharmacist dosing)      Does not apply See admin instructions 10/28/19 1922        Subjective: Seen and examined the bedside this morning.  Looks much better today.  Still complains of severe abdominal discomfort but his abdomen is soft, nondistended and less tender today.  Mental status ihassignificantly improved.  Complains of diarrhea.  Objective: Vitals:   10/29/19 1615 10/29/19 1804 10/29/19 2132 10/30/19 0437  BP: (!) 161/87 (!) 149/81 (!) 150/83 (!) 168/81  Pulse: (!) 113 (!) 119 (!) 108 (!) 110  Resp: 16 18 18 19   Temp:  100.1 F (37.8 C) 99.5 F (37.5 C) 99.6 F (37.6 C)  TempSrc:  Oral Oral Oral  SpO2: 94% 93% 99% 98%  Weight:      Height:        Intake/Output Summary (Last 24 hours) at 10/30/2019 0738 Last data filed at 10/30/2019 2703 Gross per 24 hour  Intake 563.49 ml  Output 0 ml  Net 563.49 ml   Filed Weights   10/28/19 1600 10/29/19 0317  Weight: 68 kg 69.8 kg    Examination:  General exam: Not in acute distress Respiratory system: Bilateral equal air entry, normal vesicular breath sounds, no wheezes or crackles  Cardiovascular system: S1 & S2 heard, RRR. No JVD, murmurs, rubs, gallops or clicks. Gastrointestinal system: Abdomen is nondistended, soft and has mild generalized tenderness.  Peritoneal  dialysis catheter Central nervous system: Alert and oriented. No focal neurological deficits. Extremities: No edema, no clubbing ,no cyanosis, right BKA Skin: No rashes, lesions or ulcers,no icterus ,no pallor   Data Reviewed: I have personally reviewed following labs and imaging studies  CBC: Recent Labs  Lab 10/28/19 1024 10/28/19 2024 10/29/19 0237 10/29/19 0436 10/29/19 2021  WBC 2.8* 1.5* 1.8*  --   --   HGB 7.7* 7.6* 4.6* 6.4* 7.1*  HCT 23.3* 24.0* 14.2* 19.2* 20.9*  MCV 111.0* 113.7* 110.1*  --   --   PLT 150 84* 105*  --   --    Basic Metabolic Panel: Recent Labs  Lab 10/28/19 1024 10/28/19 2024 10/30/19 0536  NA 137  --  135  K 3.6  --  4.0  CL 97*  --  94*  CO2 25  --  26  GLUCOSE 57*  --  82  BUN 31*  --  38*  CREATININE 13.42*  12.94* 13.29*  CALCIUM 8.8*  --  8.5*  PHOS  --   --  5.3*   GFR: Estimated Creatinine Clearance: 6.3 mL/min (A) (by C-G formula based on SCr of 13.29 mg/dL (H)). Liver Function Tests: Recent Labs  Lab 10/28/19 1024  AST 24  ALT 20  ALKPHOS 89  BILITOT 0.5  PROT 7.1  ALBUMIN 2.5*   Recent Labs  Lab 10/28/19 1024  LIPASE 24   No results for input(s): AMMONIA in the last 168 hours. Coagulation Profile: No results for input(s): INR, PROTIME in the last 168 hours. Cardiac Enzymes: No results for input(s): CKTOTAL, CKMB, CKMBINDEX, TROPONINI in the last 168 hours. BNP (last 3 results) No results for input(s): PROBNP in the last 8760 hours. HbA1C: No results for input(s): HGBA1C in the last 72 hours. CBG: Recent Labs  Lab 10/29/19 1219 10/29/19 1710 10/29/19 1844 10/29/19 2132 10/30/19 0622  GLUCAP 83 55* 115* 93 73   Lipid Profile: No results for input(s): CHOL, HDL, LDLCALC, TRIG, CHOLHDL, LDLDIRECT in the last 72 hours. Thyroid Function Tests: No results for input(s): TSH, T4TOTAL, FREET4, T3FREE, THYROIDAB in the last 72 hours. Anemia Panel: Recent Labs    10/30/19 0536  VITAMINB12 1,414*  FERRITIN  4,871*  TIBC 119*  IRON 64   Sepsis Labs: Recent Labs  Lab 10/28/19 2042 10/29/19 0436  LATICACIDVEN 1.5 1.7    Recent Results (from the past 240 hour(s))  SARS CORONAVIRUS 2 (TAT 6-24 HRS) Nasopharyngeal Nasopharyngeal Swab     Status: None   Collection Time: 10/28/19  6:27 PM   Specimen: Nasopharyngeal Swab  Result Value Ref Range Status   SARS Coronavirus 2 NEGATIVE NEGATIVE Final    Comment: (NOTE) SARS-CoV-2 target nucleic acids are NOT DETECTED. The SARS-CoV-2 RNA is generally detectable in upper and lower respiratory specimens during the acute phase of infection. Negative results do not preclude SARS-CoV-2 infection, do not rule out co-infections with other pathogens, and should not be used as the sole basis for treatment or other patient management decisions. Negative results must be combined with clinical observations, patient history, and epidemiological information. The expected result is Negative. Fact Sheet for Patients: SugarRoll.be Fact Sheet for Healthcare Providers: https://www.woods-mathews.com/ This test is not yet approved or cleared by the [place] FDA and  has been authorized for detection and/or diagnosis of SARS-CoV-2 by FDA under an Emergency Use Authorization (EUA). This EUA will remain  in effect (meaning this test can be used) for the duration of the COVID-19 declaration under Section 56 4(b)(1) of the Act, 21 U.S.C. section 360bbb-3(b)(1), unless the authorization is terminated or revoked sooner. Performed at [facility], [place] [address], [place], [place] [address]   Gram stain     Status: None   Collection Time: 10/28/19  9:40 PM   Specimen: Body Fluid  Result Value Ref Range Status   Specimen Description FLUID PERITONEAL DIALYSATE  Final   Special Requests NONE  Final   Gram Stain   Final    MODERATE WBC PRESENT, PREDOMINANTLY PMN NO ORGANISMS SEEN Performed at [facility],  [address] [place], [place] [address]    Report Status 10/29/2019 FINAL  Final  Culture, body fluid-bottle     Status: None (Preliminary result)   Collection Time: 10/28/19  9:40 PM   Specimen: Fluid  Result Value Ref Range Status   Specimen Description FLUID PERITONEAL DIALYSATE  Final   Special Requests   Final    BOTTLES DRAWN  AEROBIC AND ANAEROBIC Blood Culture adequate volume   Culture   Final    NO GROWTH < 12 HOURS Performed at [facility] [address], [place], [place] [address]    Report Status PENDING  Incomplete         Radiology Studies: CT ABDOMEN PELVIS WO CONTRAST  Result Date: 10/28/2019 CLINICAL DATA:  Acute generalized abdominal pain, nausea and vomiting. EXAM: CT ABDOMEN AND PELVIS WITHOUT CONTRAST TECHNIQUE: Multidetector CT imaging of the abdomen and pelvis was performed following the standard protocol without IV contrast. COMPARISON:  03/14/2019 CT abdomen/pelvis. FINDINGS: Lower chest: No significant pulmonary nodules or acute consolidative airspace disease. Low cardiac blood pool density suggests anemia. Suggestion of right coronary atherosclerosis. Hepatobiliary: Normal liver size. Simple scattered small right liver cysts, largest 1.2 cm. A few scattered subcentimeter hypodense liver lesions are too small to characterize and are unchanged and presumably benign. No new liver lesions. Cholecystectomy no biliary ductal dilatation. Pancreas: Normal, with no mass or duct dilation. Spleen: Normal size. No mass. Adrenals/Urinary Tract: Normal adrenals. Severe symmetric bilateral renal atrophy, unchanged. No hydronephrosis. No renal stones. Simple exophytic 1.7 cm posterior lower left renal cyst. No additional contour deforming renal lesions. Right lower quadrant renal transplant demonstrates no hydronephrosis, no renal stones and no contour deforming renal masses. Nondistended bladder obscured by streak artifact from right hip hardware with no gross bladder  abnormality. Stomach/Bowel: Normal non-distended stomach. Normal caliber small bowel with no small bowel wall thickening. Candidate normal appendix. Scattered mild colonic diverticulosis with no large bowel wall thickening. Vascular/Lymphatic: Atherosclerotic nonaneurysmal abdominal aorta. Newly enlarged 1.3 cm right external iliac node (series 3/image 74). No additional pathologically enlarged lymph nodes in the abdomen or pelvis. Reproductive: Normal size prostate. Other: No pneumoperitoneum. Percutaneous dialysis catheter terminates in the anterior right lower peritoneal cavity. Small volume ascites with no focal fluid collection. Musculoskeletal: No aggressive appearing focal osseous lesions. Right total hip arthroplasty. IMPRESSION: 1. Newly enlarged right external iliac lymph node, nonspecific. Suggest attention on follow-up CT abdomen/pelvis in 3 months. This recommendation follows ACR consensus guidelines: White Paper of the ACR Incidental Findings Committee II on Splenic and Nodal Findings. J Am Coll Radiol 2013;10:789-794. 2. Small volume ascites. Percutaneous dialysis catheter terminates in the anterior right lower peritoneal cavity. 3. No evidence of bowel obstruction or acute bowel inflammation. Scattered mild colonic diverticulosis with no evidence of acute diverticulitis. 4. Severe symmetric bilateral native renal atrophy. Right lower quadrant renal transplant appears unremarkable. 5. Low cardiac blood pool density suggests anemia. 6. Aortic Atherosclerosis (ICD10-I70.0). Electronically Signed   By: Ilona Sorrel M.D.   On: 10/28/2019 15:50        Scheduled Meds: . sodium chloride   Intravenous Once  . darbepoetin (ARANESP) injection - NON-DIALYSIS  150 mcg Subcutaneous Q Thu-1800  . gentamicin cream  1 application Topical Daily  . insulin aspart  0-5 Units Subcutaneous QHS  . insulin aspart  0-6 Units Subcutaneous TID WC  . pantoprazole  40 mg Oral Daily  . valGANciclovir  450 mg Oral Q  M,W,F  . vancomycin variable dose per unstable renal function (pharmacist dosing)   Does not apply See admin instructions   Continuous Infusions: . ceFEPime (MAXIPIME) IV 1 g (10/29/19 1742)  . dextrose 40 mL/hr at 10/30/19 0628  . dialysis solution 1.5% low-MG/low-CA       LOS: 2 days    Time spent:35 mins. More than 50% of that time was spent in counseling and/or coordination of care.  Shelly Coss, MD [facility] P4/03/2020, 7:38 AM

## 2019-10-30 NOTE — Progress Notes (Signed)
Subjective:  Reports ready to change to HD 2/2 third admit now with Peritonitis, lives in [place] wants to change to [place]  From [facility] / wants more  pain med for abd pain.   Objective Vital signs in last 24 hours: Vitals:   10/30/19 0437 10/30/19 0900 10/30/19 0900 10/30/19 0929  BP: (!) 168/81 (!) 144/85 (!) 144/85 (!) 164/96  Pulse: (!) 110 (!) 105 (!) 105 (!) 102  Resp: 19 18 18 18   Temp: 99.6 F (37.6 C) 98.6 F (37 C) 98.6 F (37 C) 99 F (37.2 C)  TempSrc: Oral Oral Oral Oral  SpO2: 98% 95% 95% 96%  Weight:      Height:       Weight change:   Physical Exam: General: alert NAD  Heart: RRR no rub Lungs: CTA  Abdomen: BS pos , Soft, Tender no rebound, PD cath  Extremities: no pedal edema Dialysis Access:pd cath     Dialysis Orders:  [facility]  CCPD 9H  5 cycles 2.3L  Last fill midday 1.5L  Problem/Plan:  1. Abdominal pain/Likely SBP: CT unrevealing. PD fluid with 4100 TNCs > 849 (4/07) . PD cultures = no growth 1 day . Vanc/ceftaz empirically for now - if peritonitis would switch to IP abx for subsequent doses. Prior peritonitis 03/2019 was staph hemolyticus.  2. ESRD on CCPD.  Continue CCPD for now, pending culture results. All 1.5% / Contact [facility]  To eval for access next week , no growth on BC or PD Fluid no far 3. HTN/volume-  BP up some. No gross overload on exam. Follow  4. ABLA/Anemia CKD - Hgb 7.7 >4.6 overnight. S/p 1 unit prbcs. Hgb 6.4 >7.5. this am. FOBT+  Last ESA 3/16 at outpatient center. Resume ESA here.  5. MBD of CKD -   Continue home binders/calcitriol when eating. Phos 5.3  PTH pend . 6. Nutrition. Alb 2.5 ,Prot supp when eating 7. H/o B cell lymphoma  8. Hx failed KTx    Ernest Haber, PA-C [facility] [phone] 10/30/2019,2:18 PM  LOS: 2 days   Labs: Basic Metabolic Panel: Recent Labs  Lab 10/28/19 1024 10/28/19 2024 10/30/19 0536  NA 137  --  135  K 3.6  --  4.0  CL 97*  --  94*  CO2 25  --  26   GLUCOSE 57*  --  82  BUN 31*  --  38*  CREATININE 13.42* 12.94* 13.29*  CALCIUM 8.8*  --  8.5*  PHOS  --   --  5.3*   Liver Function Tests: Recent Labs  Lab 10/28/19 1024  AST 24  ALT 20  ALKPHOS 89  BILITOT 0.5  PROT 7.1  ALBUMIN 2.5*   Recent Labs  Lab 10/28/19 1024  LIPASE 24   No results for input(s): AMMONIA in the last 168 hours. CBC: Recent Labs  Lab 10/28/19 1024 10/28/19 1024 10/28/19 2024 10/28/19 2024 10/29/19 0237 10/29/19 0237 10/29/19 0436 10/29/19 2021 10/30/19 0536  WBC 2.8*   < > 1.5*  --  1.8*  --   --   --  2.1*  NEUTROABS  --   --   --   --   --   --   --   --  1.1*  HGB 7.7*   < > 7.6*   < > 4.6*   < > 6.4* 7.1* 7.5*  HCT 23.3*   < > 24.0*   < > 14.2*   < > 19.2*  20.9* 21.8*  MCV 111.0*  --  113.7*  --  110.1*  --   --   --  101.9*  PLT 150   < > 84*  --  105*  --   --   --  103*   < > = values in this interval not displayed.   Cardiac Enzymes: No results for input(s): CKTOTAL, CKMB, CKMBINDEX, TROPONINI in the last 168 hours. CBG: Recent Labs  Lab 10/29/19 1710 10/29/19 1844 10/29/19 2132 10/30/19 0622 10/30/19 1119  GLUCAP 55* 115* 93 73 79    Studies/Results: CT ABDOMEN PELVIS WO CONTRAST  Result Date: 10/28/2019 CLINICAL DATA:  Acute generalized abdominal pain, nausea and vomiting. EXAM: CT ABDOMEN AND PELVIS WITHOUT CONTRAST TECHNIQUE: Multidetector CT imaging of the abdomen and pelvis was performed following the standard protocol without IV contrast. COMPARISON:  03/14/2019 CT abdomen/pelvis. FINDINGS: Lower chest: No significant pulmonary nodules or acute consolidative airspace disease. Low cardiac blood pool density suggests anemia. Suggestion of right coronary atherosclerosis. Hepatobiliary: Normal liver size. Simple scattered small right liver cysts, largest 1.2 cm. A few scattered subcentimeter hypodense liver lesions are too small to characterize and are unchanged and presumably benign. No new liver lesions. Cholecystectomy  no biliary ductal dilatation. Pancreas: Normal, with no mass or duct dilation. Spleen: Normal size. No mass. Adrenals/Urinary Tract: Normal adrenals. Severe symmetric bilateral renal atrophy, unchanged. No hydronephrosis. No renal stones. Simple exophytic 1.7 cm posterior lower left renal cyst. No additional contour deforming renal lesions. Right lower quadrant renal transplant demonstrates no hydronephrosis, no renal stones and no contour deforming renal masses. Nondistended bladder obscured by streak artifact from right hip hardware with no gross bladder abnormality. Stomach/Bowel: Normal non-distended stomach. Normal caliber small bowel with no small bowel wall thickening. Candidate normal appendix. Scattered mild colonic diverticulosis with no large bowel wall thickening. Vascular/Lymphatic: Atherosclerotic nonaneurysmal abdominal aorta. Newly enlarged 1.3 cm right external iliac node (series 3/image 74). No additional pathologically enlarged lymph nodes in the abdomen or pelvis. Reproductive: Normal size prostate. Other: No pneumoperitoneum. Percutaneous dialysis catheter terminates in the anterior right lower peritoneal cavity. Small volume ascites with no focal fluid collection. Musculoskeletal: No aggressive appearing focal osseous lesions. Right total hip arthroplasty. IMPRESSION: 1. Newly enlarged right external iliac lymph node, nonspecific. Suggest attention on follow-up CT abdomen/pelvis in 3 months. This recommendation follows ACR consensus guidelines: White Paper of the ACR Incidental Findings Committee II on Splenic and Nodal Findings. J Am Coll Radiol 2013;10:789-794. 2. Small volume ascites. Percutaneous dialysis catheter terminates in the anterior right lower peritoneal cavity. 3. No evidence of bowel obstruction or acute bowel inflammation. Scattered mild colonic diverticulosis with no evidence of acute diverticulitis. 4. Severe symmetric bilateral native renal atrophy. Right lower quadrant renal  transplant appears unremarkable. 5. Low cardiac blood pool density suggests anemia. 6. Aortic Atherosclerosis (ICD10-I70.0). Electronically Signed   By: Ilona Sorrel M.D.   On: 10/28/2019 15:50   Medications: . ceFEPime (MAXIPIME) IV 1 g (10/29/19 1742)  . dextrose 40 mL/hr at 10/30/19 0628  . dialysis solution 1.5% low-MG/low-CA     . sodium chloride   Intravenous Once  . darbepoetin (ARANESP) injection - NON-DIALYSIS  150 mcg Subcutaneous Q Thu-1800  . gentamicin cream  1 application Topical Daily  . insulin aspart  0-5 Units Subcutaneous QHS  . insulin aspart  0-6 Units Subcutaneous TID WC  . pantoprazole  40 mg Oral Daily  . valGANciclovir  450 mg Oral Q M,W,F  . vancomycin variable dose per unstable  renal function (pharmacist dosing)   Does not apply See admin instructions

## 2019-10-30 NOTE — Plan of Care (Signed)
  Problem: Education: Goal: Knowledge of General Education information will improve Description: Including pain rating scale, medication(s)/side effects and non-pharmacologic comfort measures Outcome: Completed/Met   Problem: Pain Managment: Goal: General experience of comfort will improve Outcome: Completed/Met   Problem: Skin Integrity: Goal: Risk for impaired skin integrity will decrease Outcome: Completed/Met   Problem: Education: Goal: Knowledge of disease and its progression will improve Outcome: Completed/Met   Problem: Fluid Volume: Goal: Compliance with measures to maintain balanced fluid volume will improve Outcome: Completed/Met

## 2019-10-31 ENCOUNTER — Inpatient Hospital Stay (HOSPITAL_COMMUNITY): Payer: Medicare Other

## 2019-10-31 DIAGNOSIS — K652 Spontaneous bacterial peritonitis: Secondary | ICD-10-CM | POA: Diagnosis not present

## 2019-10-31 DIAGNOSIS — N186 End stage renal disease: Secondary | ICD-10-CM

## 2019-10-31 LAB — GASTROINTESTINAL PANEL BY PCR, STOOL (REPLACES STOOL CULTURE)

## 2019-10-31 LAB — BASIC METABOLIC PANEL
Anion gap: 13 (ref 5–15)
BUN: 35 mg/dL — ABNORMAL HIGH (ref 6–20)
CO2: 24 mmol/L (ref 22–32)
Calcium: 7.9 mg/dL — ABNORMAL LOW (ref 8.9–10.3)
Chloride: 94 mmol/L — ABNORMAL LOW (ref 98–111)
Creatinine, Ser: 12.73 mg/dL — ABNORMAL HIGH (ref 0.61–1.24)
GFR calc Af Amer: 5 mL/min — ABNORMAL LOW (ref >60)
GFR calc non Af Amer: 4 mL/min — ABNORMAL LOW (ref >60)
Glucose, Bld: 89 mg/dL (ref 70–99)
Potassium: 3.6 mmol/L (ref 3.5–5.1)
Sodium: 131 mmol/L — ABNORMAL LOW (ref 135–145)

## 2019-10-31 LAB — CBC WITH DIFFERENTIAL/PLATELET
Abs Immature Granulocytes: 0.01 10*3/uL (ref 0.00–0.07)
Basophils Absolute: 0 10*3/uL (ref 0.0–0.1)
Basophils Relative: 0 %
Eosinophils Absolute: 0 10*3/uL (ref 0.0–0.5)
Eosinophils Relative: 2 %
HCT: 19.4 % — ABNORMAL LOW (ref 39.0–52.0)
Hemoglobin: 6.7 g/dL — CL (ref 13.0–17.0)
Immature Granulocytes: 1 %
Lymphocytes Relative: 43 %
Lymphs Abs: 0.8 10*3/uL (ref 0.7–4.0)
MCH: 34.7 pg — ABNORMAL HIGH (ref 26.0–34.0)
MCHC: 34.5 g/dL (ref 30.0–36.0)
MCV: 100.5 fL — ABNORMAL HIGH (ref 80.0–100.0)
Monocytes Absolute: 0.2 10*3/uL (ref 0.1–1.0)
Monocytes Relative: 13 %
Neutro Abs: 0.7 10*3/uL — ABNORMAL LOW (ref 1.7–7.7)
Neutrophils Relative %: 41 %
Platelets: 92 10*3/uL — ABNORMAL LOW (ref 150–400)
RBC: 1.93 MIL/uL — ABNORMAL LOW (ref 4.22–5.81)
RDW: 17.8 % — ABNORMAL HIGH (ref 11.5–15.5)
WBC: 1.7 10*3/uL — ABNORMAL LOW (ref 4.0–10.5)
nRBC: 0 % (ref 0.0–0.2)

## 2019-10-31 LAB — PROTIME-INR
INR: 1.6 — ABNORMAL HIGH (ref 0.8–1.2)
Prothrombin Time: 19.3 seconds — ABNORMAL HIGH (ref 11.4–15.2)

## 2019-10-31 LAB — HEMOGLOBIN AND HEMATOCRIT, BLOOD
HCT: 23.6 % — ABNORMAL LOW (ref 39.0–52.0)
Hemoglobin: 8.3 g/dL — ABNORMAL LOW (ref 13.0–17.0)

## 2019-10-31 LAB — GLUCOSE, CAPILLARY
Glucose-Capillary: 101 mg/dL — ABNORMAL HIGH (ref 70–99)
Glucose-Capillary: 104 mg/dL — ABNORMAL HIGH (ref 70–99)
Glucose-Capillary: 141 mg/dL — ABNORMAL HIGH (ref 70–99)
Glucose-Capillary: 197 mg/dL — ABNORMAL HIGH (ref 70–99)

## 2019-10-31 LAB — VANCOMYCIN, RANDOM: Vancomycin Rm: 21

## 2019-10-31 MED ORDER — FOLIC ACID 1 MG PO TABS
1.0000 mg | ORAL_TABLET | Freq: Every day | ORAL | Status: DC
  Administered 2019-10-31 – 2019-11-04 (×5): 1 mg via ORAL
  Filled 2019-10-31 (×5): qty 1

## 2019-10-31 MED ORDER — CALCITRIOL 0.5 MCG PO CAPS
0.7500 ug | ORAL_CAPSULE | Freq: Every day | ORAL | Status: DC
  Administered 2019-10-31 – 2019-11-04 (×5): 0.75 ug via ORAL
  Filled 2019-10-31 (×5): qty 1

## 2019-10-31 MED ORDER — METOPROLOL TARTRATE 100 MG PO TABS
100.0000 mg | ORAL_TABLET | Freq: Two times a day (BID) | ORAL | Status: DC
  Administered 2019-10-31 – 2019-11-04 (×9): 100 mg via ORAL
  Filled 2019-10-31 (×10): qty 1

## 2019-10-31 MED ORDER — TERAZOSIN HCL 1 MG PO CAPS: 1.0000 mg | ORAL_CAPSULE | Freq: Every day | ORAL | Status: DC

## 2019-10-31 MED ORDER — LISINOPRIL 20 MG PO TABS
20.0000 mg | ORAL_TABLET | Freq: Every day | ORAL | Status: DC
  Administered 2019-10-31 – 2019-11-02 (×3): 20 mg via ORAL
  Filled 2019-10-31 (×3): qty 1

## 2019-10-31 MED ORDER — SODIUM CHLORIDE 0.9% IV SOLUTION: Freq: Once | INTRAVENOUS | Status: AC

## 2019-10-31 MED ORDER — WARFARIN SODIUM 5 MG PO TABS
5.0000 mg | ORAL_TABLET | Freq: Once | ORAL | Status: AC
  Administered 2019-10-31: 5 mg via ORAL
  Filled 2019-10-31: qty 1

## 2019-10-31 MED ORDER — DOXAZOSIN MESYLATE 2 MG PO TABS
4.0000 mg | ORAL_TABLET | Freq: Every day | ORAL | Status: DC
  Administered 2019-10-31 – 2019-11-03 (×4): 4 mg via ORAL
  Filled 2019-10-31 (×4): qty 2

## 2019-10-31 MED ORDER — VANCOMYCIN 50 MG/ML ORAL SOLUTION
125.0000 mg | Freq: Three times a day (TID) | ORAL | Status: DC
  Administered 2019-10-31 – 2019-11-04 (×14): 125 mg via ORAL
  Filled 2019-10-31 (×22): qty 2.5

## 2019-10-31 MED ORDER — FERROUS SULFATE 325 (65 FE) MG PO TABS
325.0000 mg | ORAL_TABLET | Freq: Two times a day (BID) | ORAL | Status: DC
  Administered 2019-10-31 – 2019-11-04 (×7): 325 mg via ORAL
  Filled 2019-10-31 (×7): qty 1

## 2019-10-31 MED ORDER — WARFARIN - PHARMACIST DOSING INPATIENT: Freq: Every day | Status: DC

## 2019-10-31 NOTE — Plan of Care (Signed)
  Problem: Nutrition: Goal: Adequate nutrition will be maintained Outcome: Progressing   

## 2019-10-31 NOTE — Progress Notes (Signed)
ANTICOAGULATION CONSULT NOTE - Follow Up Consult  Pharmacy Consult for Warfarin Indication: Splenic infarcts  Allergies  Allergen Reactions  . Tramadol Hcl Shortness Of Breath and Other (See Comments)    Tramadol HCl = Shortness of breath and Tramadol = Rash  . Nsaids Other (See Comments)    Was told by MD to not take these  . Darvon [Propoxyphene] Rash and Other (See Comments)    Broke out on eyelid   . Tramadol Rash and Other (See Comments)    Tramadol = Rash and Tramadol HCl = Shortness of breath    Patient Measurements: Height: 5\' 6"  (167.6 cm) Weight: 69.8 kg (153 lb 14.1 oz) IBW/kg (Calculated) : 63.8  Vital Signs: Temp: 98.8 F (37.1 C) (04/10 0509) Temp Source: Oral (04/10 0509) BP: 166/91 (04/10 0509) Pulse Rate: 106 (04/10 0509)  Labs: Recent Labs    10/28/19 1024 10/28/19 1024 10/28/19 2024 10/28/19 2024 10/29/19 0237 10/29/19 0237 10/29/19 0436 10/29/19 0436 10/29/19 2021 10/30/19 0536  HGB 7.7*   < > 7.6*   < > 4.6*   < > 6.4*   < > 7.1* 7.5*  HCT 23.3*   < > 24.0*   < > 14.2*   < > 19.2*  --  20.9* 21.8*  PLT 150   < > 84*  --  105*  --   --   --   --  103*  CREATININE 13.42*  --  12.94*  --   --   --   --   --   --  13.29*   < > = values in this interval not displayed.    Estimated Creatinine Clearance: 6.3 mL/min (A) (by C-G formula based on SCr of 13.29 mg/dL (H)).   Assessment: 37 YOM on Warfarin PTA for hx splenic infarcts held on admission due to low Hgb. No INR taken on admit. PTA dose listed as 5 mg daily EXCEPT for 7.5 mg on Tues/Thurs. Pharmacy consulted to resume Warfarin dosing 4/10.  INR today is SUBtherapeutic at 1.6. Last dose PTA listed as 4/6. Hgb down to 6.7 today, plts 92 - discussed with MD and to continue with warfarin dosing today. Given the patient's acute infection - will not be aggressive with dosing as this may contribute to increased sensitivity.  Goal of Therapy:  INR 2-3 Monitor platelets by anticoagulation  protocol: Yes   Plan:  - Warfarin 5 mg x 1 dose today - Daily PT/INR, CBC q72h - Will continue to monitor for any signs/symptoms of bleeding and will follow up with PT/INR in the a.m.    Thank you for allowing pharmacy to be a part of this patient's care.  Alycia Rossetti, PharmD, BCPS Clinical Pharmacist Clinical phone for 10/31/2019: [phone] 10/31/2019 11:40 AM   **Pharmacist phone directory can now be found on [email or web address] ([email or web address]).  Listed under [place].

## 2019-10-31 NOTE — Progress Notes (Addendum)
PROGRESS NOTE    Macdonald Rigor [facility]  GHW:[id number] DOB: 1971/02/06 DOA: 10/28/2019 PCP: [name], MD   Brief Narrative:  Patient is a 49 year old male with history of end-stage renal disease status post renal transplant in 2008  , with failure in 2018 now on peritoneal dialysis, diabetes type 2, hypertension, hyperlipidemia who presented to the emergency department with 2 days complaints of abdomen pain, nausea, vomiting, diarrhea.  Also reported fever, chills.  Patient admitted for the management of possible spontaneous bacterial peritonitis.  Underwent paracentesis with confirmatory diagnosis of SBP.  Started on antibiotics.  Nephrology consulted and following. Plan is to stop PD and start on hemodialysis .vascular surgery also consulted for temporary dialysis catheter placement and AV graft. C. difficile came out to be positive, started on oral vancomycin.  Assessment & Plan:   Principal Problem:   Spontaneous bacterial peritonitis ([place]) Active Problems:   ESRD on peritoneal dialysis ([place])   Diabetes mellitus without complication ([place])   Hypertension   Renal transplant recipient   SBP: Underwent paracentesis with findings of SBP.  He has been febrile, tachycardic on presentation. Peritoneal fluid culture,blood culture NGTD.  Gram stain did not show any organisms.  He had history of staphylococcal hemolyticus peritonitis back in 03/2019 and was treated with vancomycin.  We will continue vancomycin  for now.  We will change the antibiotic to doxycycline when appropriate.     Abdominal pain/C diff/diarrhea: Presented with abdominal pain,diarrhoea CT abdomen/pelvis did not show any signs of bowel inflammation.Found to have Newly enlarged right external iliac lymph node, nonspecific. C. difficile antigen is positive, toxin is negative but patient was having diarrhea/abdominal pain so we will treat with oral vancomycin,10 days course.  ESRD on peritoneal dialysis: Nephrology following.  He  has a peritoneal  dialysis catheter.He has history of renal transplant which ultimately failed.  He follows with Dr. Percell Belt at [facility].  He takes prednisone, Prograf at home which are on hold due to possible underlying sepsis from SBP. Patient wants to stop peritoneal dialysis and go for hemodialysis now. Vascular surgery consulted for temporary dialysis catheter placement, AV graft  Diabetes type 2: Continue sliding scale insulin.    He is on insulin at home.  Hypertension: He has been hypertensive.  Continue his home medications  History of peripheral vascular disease: Status post recent right BKA.  Continue current meds  Pancytopenia: Hemoglobin dropped to 6.4, transfused with 1 unit of PRBC.  Normal iron, high ferritin, high vitamin B12.  Also has thrombocytopenia and leukopenia. Positive stool occult blood.  It could be associated with his colitis.  His hemoglobin has been  running in the range of 7 since last year, most likely this is associated with his ESRD.  He does not have any active bleeding at present.  He is also not a candidate for any kind of procedure due to possible underlying sepsis,C diff.    I will defer GI consultation.  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 Will restart. Monitor INR  Generalized weakness, debility, deconditioning: We have put  request for PT.           DVT prophylaxis:ScD Code Status: Full Family Communication: None present at the bedside Disposition Plan: Patient is from home. Plan for tunnel catheter placement/AV graft. Discharge planning after nephrology clearance.  Consultants: Nephrology  Procedures: Paracentesis  Antimicrobials:  Anti-infectives (From admission, onward)   Start     Dose/Rate Route Frequency Ordered Stop  10/31/19 0830  vancomycin (VANCOCIN) 50 mg/mL oral solution 125 mg     125 mg Oral 3 times daily before meals & bedtime 10/31/19 0756     10/30/19 1000  valGANciclovir  (VALCYTE) 450 MG tablet TABS 450 mg    Note to Pharmacy: Take one tablet (450 mg) by mouth on Monday, Wednesday, Friday before dialysis     450 mg Oral Every M-W-F 10/29/19 1332     10/29/19 1500  ceFEPIme (MAXIPIME) 1 g in sodium chloride 0.9 % 100 mL IVPB  Status:  Discontinued     1 g 200 mL/hr over 30 Minutes Intravenous Every 24 hours 10/29/19 1350 10/31/19 0759   10/29/19 0900  cefTRIAXone (ROCEPHIN) 2 g in sodium chloride 0.9 % 100 mL IVPB  Status:  Discontinued     2 g 200 mL/hr over 30 Minutes Intravenous Every 24 hours 10/29/19 0809 10/29/19 1332   10/28/19 1945  vancomycin (VANCOREADY) IVPB 1500 mg/300 mL     1,500 mg 150 mL/hr over 120 Minutes Intravenous  Once 10/28/19 1944 10/29/19 0132   10/28/19 1930  vancomycin (VANCOCIN) 1,500 mg in sodium chloride 0.9 % 500 mL IVPB  Status:  Discontinued     1,500 mg 250 mL/hr over 120 Minutes Intravenous  Once 10/28/19 1922 10/28/19 1943   10/28/19 1930  cefTAZidime (FORTAZ) 1 g in sodium chloride 0.9 % 100 mL IVPB  Status:  Discontinued     1 g 200 mL/hr over 30 Minutes Intravenous Every 24 hours 10/28/19 1922 10/29/19 0321   10/28/19 1922  vancomycin variable dose per unstable renal function (pharmacist dosing)      Does not apply See admin instructions 10/28/19 1922        Subjective:  Patient seen and examined at the bedside this morning.  Hemodynamically stable.  Abdominal pain is better today.  No new complaints.  Diarrhea is slowing down and is making soft stools.  Objective: Vitals:   10/30/19 1853 10/30/19 1915 10/30/19 2159 10/31/19 0509  BP: (!) 157/93 (!) 146/89 (!) 158/87 (!) 166/91  Pulse: (!) 102 98 (!) 104 (!) 106  Resp: 18 18 18 17   Temp: 98.4 F (36.9 C) 98.4 F (36.9 C) 98.1 F (36.7 C) 98.8 F (37.1 C)  TempSrc: Oral Oral Oral Oral  SpO2: 96% 96% 92% 100%  Weight:      Height:        Intake/Output Summary (Last 24 hours) at 10/31/2019 0759 Last data filed at 10/31/2019 0600 Gross per 24 hour  Intake  1055.49 ml  Output 100 ml  Net 955.49 ml   Filed Weights   10/28/19 1600 10/29/19 0317  Weight: 68 kg 69.8 kg    Examination:  General exam: Generalized weakness  Respiratory system:no wheezes or crackles  Cardiovascular system: S1 & S2 heard, RRR. No JVD, murmurs, rubs, gallops or clicks. Gastrointestinal system: Abdomen is nondistended, soft and has generalized tenderness.  Peritoneal dialysis catheter  Central nervous system: Alert and oriented. No focal neurological deficits. Extremities: No edema, no clubbing ,no cyanosis, right BKA Skin: No rashes, lesions or ulcers,no icterus ,no pallor  Data Reviewed: I have personally reviewed following labs and imaging studies  CBC: Recent Labs  Lab 10/28/19 1024 10/28/19 1024 10/28/19 2024 10/29/19 0237 10/29/19 0436 10/29/19 2021 10/30/19 0536  WBC 2.8*  --  1.5* 1.8*  --   --  2.1*  NEUTROABS  --   --   --   --   --   --  1.1*  HGB 7.7*   < > 7.6* 4.6* 6.4* 7.1* 7.5*  HCT 23.3*   < > 24.0* 14.2* 19.2* 20.9* 21.8*  MCV 111.0*  --  113.7* 110.1*  --   --  101.9*  PLT 150  --  84* 105*  --   --  103*   < > = values in this interval not displayed.   Basic Metabolic Panel: Recent Labs  Lab 10/28/19 1024 10/28/19 2024 10/30/19 0536  NA 137  --  135  K 3.6  --  4.0  CL 97*  --  94*  CO2 25  --  26  GLUCOSE 57*  --  82  BUN 31*  --  38*  CREATININE 13.42* 12.94* 13.29*  CALCIUM 8.8*  --  8.5*  PHOS  --   --  5.3*   GFR: Estimated Creatinine Clearance: 6.3 mL/min (A) (by C-G formula based on SCr of 13.29 mg/dL (H)). Liver Function Tests: Recent Labs  Lab 10/28/19 1024  AST 24  ALT 20  ALKPHOS 89  BILITOT 0.5  PROT 7.1  ALBUMIN 2.5*   Recent Labs  Lab 10/28/19 1024  LIPASE 24   No results for input(s): AMMONIA in the last 168 hours. Coagulation Profile: No results for input(s): INR, PROTIME in the last 168 hours. Cardiac Enzymes: No results for input(s): CKTOTAL, CKMB, CKMBINDEX, TROPONINI in the last  168 hours. BNP (last 3 results) No results for input(s): PROBNP in the last 8760 hours. HbA1C: Recent Labs    10/28/19 2024  HGBA1C QNSTST   CBG: Recent Labs  Lab 10/29/19 2132 10/30/19 0622 10/30/19 1119 10/30/19 1633 10/31/19 0644  GLUCAP 93 73 79 92 101*   Lipid Profile: No results for input(s): CHOL, HDL, LDLCALC, TRIG, CHOLHDL, LDLDIRECT in the last 72 hours. Thyroid Function Tests: No results for input(s): TSH, T4TOTAL, FREET4, T3FREE, THYROIDAB in the last 72 hours. Anemia Panel: Recent Labs    10/30/19 0536  VITAMINB12 1,414*  FERRITIN 4,871*  TIBC 119*  IRON 64   Sepsis Labs: Recent Labs  Lab 10/28/19 2042 10/29/19 0436  LATICACIDVEN 1.5 1.7    Recent Results (from the past 240 hour(s))  SARS CORONAVIRUS 2 (TAT 6-24 HRS) Nasopharyngeal Nasopharyngeal Swab     Status: None   Collection Time: 10/28/19  6:27 PM   Specimen: Nasopharyngeal Swab  Result Value Ref Range Status   SARS Coronavirus 2 NEGATIVE NEGATIVE Final    Comment: (NOTE) SARS-CoV-2 target nucleic acids are NOT DETECTED. The SARS-CoV-2 RNA is generally detectable in upper and lower respiratory specimens during the acute phase of infection. Negative results do not preclude SARS-CoV-2 infection, do not rule out co-infections with other pathogens, and should not be used as the sole basis for treatment or other patient management decisions. Negative results must be combined with clinical observations, patient history, and epidemiological information. The expected result is Negative. Fact Sheet for Patients: SugarRoll.be Fact Sheet for Healthcare Providers: https://www.woods-mathews.com/ This test is not yet approved or cleared by the [place] FDA and  has been authorized for detection and/or diagnosis of SARS-CoV-2 by FDA under an Emergency Use Authorization (EUA). This EUA will remain  in effect (meaning this test can be used) for the duration  of the COVID-19 declaration under Section 56 4(b)(1) of the Act, 21 U.S.C. section 360bbb-3(b)(1), unless the authorization is terminated or revoked sooner. Performed at [facility], [place] [address] [place], [place] [address]   Gram stain  Status: None   Collection Time: 10/28/19  9:40 PM   Specimen: Body Fluid  Result Value Ref Range Status   Specimen Description FLUID PERITONEAL DIALYSATE  Final   Special Requests NONE  Final   Gram Stain   Final    MODERATE WBC PRESENT, PREDOMINANTLY PMN NO ORGANISMS SEEN Performed at [facility], [address] [place], [place] [address]    Report Status 10/29/2019 FINAL  Final  Culture, body fluid-bottle     Status: None (Preliminary result)   Collection Time: 10/28/19  9:40 PM   Specimen: Fluid  Result Value Ref Range Status   Specimen Description FLUID PERITONEAL DIALYSATE  Final   Special Requests   Final    BOTTLES DRAWN AEROBIC AND ANAEROBIC Blood Culture adequate volume   Culture   Final    NO GROWTH 1 DAY Performed at [facility], [place] [address], [place], [place] [address]    Report Status PENDING  Incomplete  Culture, blood (routine x 2)     Status: None (Preliminary result)   Collection Time: 10/29/19 12:46 AM   Specimen: BLOOD  Result Value Ref Range Status   Specimen Description BLOOD RIGHT HAND  Final   Special Requests   Final    BOTTLES DRAWN AEROBIC AND ANAEROBIC Blood Culture adequate volume   Culture   Final    NO GROWTH 1 DAY Performed at [facility], [place] [address] [place], [place] [address]    Report Status PENDING  Incomplete  Culture, blood (routine x 2)     Status: None (Preliminary result)   Collection Time: 10/29/19 12:47 AM   Specimen: BLOOD  Result Value Ref Range Status   Specimen Description BLOOD RIGHT FINGER  Final   Special Requests   Final    BOTTLES DRAWN AEROBIC ONLY Blood Culture adequate volume   Culture   Final    NO GROWTH 1 DAY Performed at [facility], [place] [address] [place], [place] [address]    Report Status PENDING  Incomplete  C Difficile Quick Screen w PCR reflex     Status: Abnormal   Collection Time: 10/30/19 12:43 PM   Specimen: Stool  Result Value Ref Range Status   C Diff antigen POSITIVE (A) NEGATIVE Final   C Diff toxin NEGATIVE NEGATIVE Final   C Diff interpretation Results are indeterminate. See PCR results.  Final    Comment: Performed at [facility], [place] [address], [place], [place] [address]  C. Diff by PCR, Reflexed     Status: Abnormal   Collection Time: 10/30/19 12:43 PM  Result Value Ref Range Status   Toxigenic C. Difficile by PCR POSITIVE (A) NEGATIVE Final    Comment: Positive for toxigenic C. difficile with little to no toxin production. Only treat if clinical presentation suggests symptomatic illness. Performed at [facility], [place] [address], [place], [place] [address]          Radiology Studies: No results found.      Scheduled Meds: . sodium chloride   Intravenous Once  . darbepoetin (ARANESP) injection - NON-DIALYSIS  150 mcg Subcutaneous Q Thu-1800  . gentamicin cream  1 application Topical Daily  . insulin aspart  0-5 Units Subcutaneous QHS  . insulin aspart  0-6 Units Subcutaneous TID WC  . pantoprazole  40 mg Oral Daily  . valGANciclovir  450 mg Oral Q M,W,F  . vancomycin  125 mg Oral TID AC &  HS  . vancomycin variable dose per unstable renal function (pharmacist dosing)   Does not apply See admin instructions   Continuous Infusions: . dextrose 40 mL/hr at 10/30/19 2129  . dialysis solution 1.5% low-MG/low-CA       LOS: 3 days    Time spent:35 mins. More than 50% of that time was spent in counseling and/or coordination of care.      Shelly Coss, MD [facility] P4/04/2020, 7:59 AM

## 2019-10-31 NOTE — Progress Notes (Addendum)
Subjective:  abd discomfort still present , but tolerates PD exchanges / [facility] consulted for HD access when infection issue stable   Objective Vital signs in last 24 hours: Vitals:   10/30/19 2159 10/31/19 0509 10/31/19 1004 10/31/19 1201  BP: (!) 158/87 (!) 166/91 (!) 180/108 (!) 184/89  Pulse: (!) 104 (!) 106 (!) 105 (!) 110  Resp: 18 17 18 20   Temp: 98.1 F (36.7 C) 98.8 F (37.1 C) 98.1 F (36.7 C) 99 F (37.2 C)  TempSrc: Oral Oral Oral Oral  SpO2: 92% 100% 100% 96%  Weight:      Height:       Weight change:   Physical Exam: General: alert NAD  Heart: RRR no rub Lungs: CTA  Abdomen: BS pos , Soft, Tender no rebound, PD cath  Extremities: no pedal edema Dialysis Access:pd cath     Dialysis Orders: [facility] CCPD 9H 5 cycles 2.3L Last fill midday 1.5L  Problem/Plan:  1.Abdominal pain/Likely SBP: CT unrevealing.PD fluid with 4100 TNCs > 849 (4/07) . PD cultures = no growth 2 days and BC  No growth x 2 days Vanc empirically for now -in PD  Fluids  Per Phar consult .  Prior peritonitis 03/2019 was staph hemolyticus.Send sample from take-off in am for cell count.  2. ESRDon CCPD. Continue CCPD for now, pending culture results. All 1.5% / Have Contacted [facility]  To eval for access next week , no growth on BC or PD Fluid no far 3. HTN/volume-BP up some. No gross overload on exam. Lisinopril 10 mg  q day/ Metoprolol 100 mg bid  started  4. ABLA/Anemia CKD - Hgb 7.7 >4.6 overnight. S/p 1 unit prbcs. Hgb 6.4 >7.5> 6.7 . this am 1 unit PRBCS , . FOBT+ Last ESA 3/16 at outpatient center. Resume ESA here. Aranesp 165mcg  subcut  q tues ( given 4/08 )  5.MBD of CKD -Continue home binders/calcitriol when eating. Phos 5.3  PTHpend . 6.Nutrition. Alb 2.5 ,Prot supp when eating 7. H/oB cell lymphoma  8. Hx failed KTx  Ernest Haber, PA-C  [phone] 10/31/2019,12:27 PM  LOS: 3 days   Pt seen, examined and agree w assess/plan as  above with additions as indicated.  [facility] 10/31/2019, 6:33 PM     Labs: Basic Metabolic Panel: Recent Labs  Lab 10/28/19 1024 10/28/19 1024 10/28/19 2024 10/30/19 0536 10/31/19 0810  NA 137  --   --  135 131*  K 3.6  --   --  4.0 3.6  CL 97*  --   --  94* 94*  CO2 25  --   --  26 24  GLUCOSE 57*  --   --  82 89  BUN 31*  --   --  38* 35*  CREATININE 13.42*   < > 12.94* 13.29* 12.73*  CALCIUM 8.8*  --   --  8.5* 7.9*  PHOS  --   --   --  5.3*  --    < > = values in this interval not displayed.   Liver Function Tests: Recent Labs  Lab 10/28/19 1024  AST 24  ALT 20  ALKPHOS 89  BILITOT 0.5  PROT 7.1  ALBUMIN 2.5*   Recent Labs  Lab 10/28/19 1024  LIPASE 24   No results for input(s): AMMONIA in the last 168 hours. CBC: Recent Labs  Lab 10/28/19 1024 10/28/19 1024 10/28/19 2024 10/28/19 2024 10/29/19 0237 10/29/19 0436 10/29/19 2021 10/30/19  0536 10/31/19 0810  WBC 2.8*   < > 1.5*   < > 1.8*  --   --  2.1* 1.7*  NEUTROABS  --   --   --   --   --   --   --  1.1* 0.7*  HGB 7.7*   < > 7.6*   < > 4.6*   < > 7.1* 7.5* 6.7*  HCT 23.3*   < > 24.0*   < > 14.2*   < > 20.9* 21.8* 19.4*  MCV 111.0*  --  113.7*  --  110.1*  --   --  101.9* 100.5*  PLT 150   < > 84*   < > 105*  --   --  103* 92*   < > = values in this interval not displayed.   Cardiac Enzymes: No results for input(s): CKTOTAL, CKMB, CKMBINDEX, TROPONINI in the last 168 hours. CBG: Recent Labs  Lab 10/30/19 0622 10/30/19 1119 10/30/19 1633 10/31/19 0644 10/31/19 1118  GLUCAP 73 79 92 101* 104*    Studies/Results: No results found. Medications: . dialysis solution 1.5% low-MG/low-CA     . sodium chloride   Intravenous Once  . sodium chloride   Intravenous Once  . calcitRIOL  0.75 mcg Oral Daily  . darbepoetin (ARANESP) injection - NON-DIALYSIS  150 mcg Subcutaneous Q Thu-1800  . doxazosin  4 mg Oral QHS  . ferrous sulfate  325 mg Oral BID WC  . folic acid   1 mg Oral Daily  . gentamicin cream  1 application Topical Daily  . insulin aspart  0-5 Units Subcutaneous QHS  . insulin aspart  0-6 Units Subcutaneous TID WC  . lisinopril  20 mg Oral Daily  . metoprolol tartrate  100 mg Oral BID  . valGANciclovir  450 mg Oral Q M,W,F  . vancomycin  125 mg Oral TID AC & HS  . vancomycin variable dose per unstable renal function (pharmacist dosing)   Does not apply See admin instructions  . warfarin  5 mg Oral ONCE-1600  . Warfarin - Pharmacist Dosing Inpatient   Does not apply [id number]

## 2019-10-31 NOTE — Progress Notes (Signed)
Pharmacy Antibiotic Note  James [name] is a 49 y.o. male admitted on 10/28/2019 with SBP.  Cefepime has been discontinued. Pharmacy continues with Vancomycin dosing.   The patient's random level today resulted at 21 mcg/ml - it is estimated that after CCPD this evening, the patient's level will be <20 mcg/ml. Will plan to concentrate Vancomycin in the CCPD fluids at a concentration of 25 mg/L starting on 4/11 evening. Will need to coordinate this with the nurse doing PD.   Plan: - No Vancomycin today - Will plan to add Vancomycin at a concentration of 25 mg/L to the CCPD fluids starting on 4/11 evening - Plan discussed with renal - will also coordinate with the RN administering CCPD for this patient - Will continue to monitor CCPD tolerance and eventual plans to transition to HD  Height: 5\' 6"  (167.6 cm) Weight: 69.8 kg (153 lb 14.1 oz) IBW/kg (Calculated) : 63.8  Temp (24hrs), Avg:98.5 F (36.9 C), Min:98.1 F (36.7 C), Max:99 F (37.2 C)  Recent Labs  Lab 10/28/19 1024 10/28/19 2024 10/28/19 2042 10/29/19 0237 10/29/19 0436 10/30/19 0536  WBC 2.8* 1.5*  --  1.8*  --  2.1*  CREATININE 13.42* 12.94*  --   --   --  13.29*  LATICACIDVEN  --   --  1.5  --  1.7  --     Estimated Creatinine Clearance: 6.3 mL/min (A) (by C-G formula based on SCr of 13.29 mg/dL (H)).    Allergies  Allergen Reactions  . Tramadol Hcl Shortness Of Breath and Other (See Comments)    Tramadol HCl = Shortness of breath and Tramadol = Rash  . Nsaids Other (See Comments)    Was told by MD to not take these  . Darvon [Propoxyphene] Rash and Other (See Comments)    Broke out on eyelid   . Tramadol Rash and Other (See Comments)    Tramadol = Rash and Tramadol HCl = Shortness of breath    Ceftaz 4/7 x 1 Rocephin 4/8 x 1 Cefepime 4/8>> 4/10 Vanc IV 4/7 >> Vanc po 4/10 >> Valcyte (for EBV)>>  4/7: Peritoneal fluid Cx: (negative gram stain)>> 4/9 CDiff >> antigen +, toxin -, PCR +   Thank you for  allowing pharmacy to be a part of this patient's care.  Alycia Rossetti, PharmD, BCPS Clinical Pharmacist Clinical phone for 10/31/2019: [phone] 10/31/2019 11:01 AM   **Pharmacist phone directory can now be found on [email or web address] ([email or web address]).  Listed under [place].

## 2019-10-31 NOTE — Progress Notes (Signed)
PT Cancellation Note  Patient Details Name: James [name] MRN: [id number] DOB: [birth year]   Cancelled Treatment:    Reason Eval/Treat Not Completed: Medical issues which prohibited therapy. Pt with Hgb this AM at 6.7. Pt's RN reporting that he will be receiving blood soon and requesting to hold until after it is finished. PT will continue to f/u with pt acutely as available and appropriate.    Clearnce Sorrel [name] 10/31/2019, 12:10 PM

## 2019-10-31 NOTE — Plan of Care (Signed)
  Problem: Health Behavior/Discharge Planning: Goal: Ability to manage health-related needs will improve Outcome: Completed/Met   Problem: Clinical Measurements: Goal: Diagnostic test results will improve Outcome: Completed/Met   Problem: Elimination: Goal: Will not experience complications related to bowel motility Outcome: Completed/Met Goal: Will not experience complications related to urinary retention Outcome: Completed/Met   Problem: Health Behavior/Discharge Planning: Goal: Ability to manage health-related needs will improve Outcome: Completed/Met   Problem: Clinical Measurements: Goal: Complications related to the disease process, condition or treatment will be avoided or minimized Outcome: Completed/Met

## 2019-11-01 DIAGNOSIS — K652 Spontaneous bacterial peritonitis: Secondary | ICD-10-CM | POA: Diagnosis not present

## 2019-11-01 LAB — BASIC METABOLIC PANEL
Anion gap: 14 (ref 5–15)
BUN: 30 mg/dL — ABNORMAL HIGH (ref 6–20)
CO2: 26 mmol/L (ref 22–32)
Calcium: 8.3 mg/dL — ABNORMAL LOW (ref 8.9–10.3)
Chloride: 93 mmol/L — ABNORMAL LOW (ref 98–111)
Creatinine, Ser: 11.49 mg/dL — ABNORMAL HIGH (ref 0.61–1.24)
GFR calc Af Amer: 5 mL/min — ABNORMAL LOW (ref >60)
GFR calc non Af Amer: 5 mL/min — ABNORMAL LOW (ref >60)
Glucose, Bld: 136 mg/dL — ABNORMAL HIGH (ref 70–99)
Potassium: 3.2 mmol/L — ABNORMAL LOW (ref 3.5–5.1)
Sodium: 133 mmol/L — ABNORMAL LOW (ref 135–145)

## 2019-11-01 LAB — BPAM RBC
Blood Product Expiration Date: 202105082359
Blood Product Expiration Date: 202105132359
ISSUE DATE / TIME: 202104081308
ISSUE DATE / TIME: 202104101215
Unit Type and Rh: 5100
Unit Type and Rh: 5100

## 2019-11-01 LAB — GLUCOSE, CAPILLARY
Glucose-Capillary: 108 mg/dL — ABNORMAL HIGH (ref 70–99)
Glucose-Capillary: 89 mg/dL (ref 70–99)
Glucose-Capillary: 151 mg/dL — ABNORMAL HIGH (ref 70–99)

## 2019-11-01 LAB — CBC WITH DIFFERENTIAL/PLATELET
Abs Immature Granulocytes: 0.01 10*3/uL (ref 0.00–0.07)
Basophils Absolute: 0 10*3/uL (ref 0.0–0.1)
Basophils Relative: 0 %
Eosinophils Absolute: 0 10*3/uL (ref 0.0–0.5)
Eosinophils Relative: 1 %
HCT: 24.7 % — ABNORMAL LOW (ref 39.0–52.0)
Hemoglobin: 8.6 g/dL — ABNORMAL LOW (ref 13.0–17.0)
Immature Granulocytes: 1 %
Lymphocytes Relative: 48 %
Lymphs Abs: 0.7 10*3/uL (ref 0.7–4.0)
MCH: 33.9 pg (ref 26.0–34.0)
MCHC: 34.8 g/dL (ref 30.0–36.0)
MCV: 97.2 fL (ref 80.0–100.0)
Monocytes Absolute: 0.2 10*3/uL (ref 0.1–1.0)
Monocytes Relative: 12 %
Neutro Abs: 0.6 10*3/uL — ABNORMAL LOW (ref 1.7–7.7)
Neutrophils Relative %: 38 %
Platelets: 85 10*3/uL — ABNORMAL LOW (ref 150–400)
RBC: 2.54 MIL/uL — ABNORMAL LOW (ref 4.22–5.81)
RDW: 17.6 % — ABNORMAL HIGH (ref 11.5–15.5)
WBC: 1.5 10*3/uL — ABNORMAL LOW (ref 4.0–10.5)
nRBC: 0 % (ref 0.0–0.2)

## 2019-11-01 LAB — TYPE AND SCREEN
ABO/RH(D): O POS
Antibody Screen: NEGATIVE
Unit division: 0
Unit division: 0

## 2019-11-01 LAB — HEPATITIS B SURFACE ANTIBODY,QUALITATIVE: Hep B S Ab: REACTIVE — AB

## 2019-11-01 LAB — SURGICAL PCR SCREEN
MRSA, PCR: NEGATIVE
Staphylococcus aureus: NEGATIVE

## 2019-11-01 LAB — PROTIME-INR
INR: 1.2 (ref 0.8–1.2)
Prothrombin Time: 15.3 seconds — ABNORMAL HIGH (ref 11.4–15.2)

## 2019-11-01 LAB — HEPATITIS B CORE ANTIBODY, TOTAL: Hep B Core Total Ab: NONREACTIVE

## 2019-11-01 LAB — HEPATITIS B SURFACE ANTIGEN: Hepatitis B Surface Ag: NONREACTIVE

## 2019-11-01 MED ORDER — VANCOMYCIN 100 MG/ML FOR DIALYSIS
Freq: Every day | Status: DC
  Filled 2019-11-01 (×2): qty 5000

## 2019-11-01 MED ORDER — POTASSIUM CHLORIDE CRYS ER 20 MEQ PO TBCR
40.0000 meq | EXTENDED_RELEASE_TABLET | Freq: Once | ORAL | Status: AC
  Administered 2019-11-01: 40 meq via ORAL
  Filled 2019-11-01: qty 2

## 2019-11-01 MED ORDER — VANCOMYCIN 100 MG/ML FOR DIALYSIS
Freq: Every day | Status: DC
  Filled 2019-11-01: qty 5000

## 2019-11-01 MED ORDER — DELFLEX-LC/2.5% DEXTROSE 394 MOSM/L IP SOLN: INTRAPERITONEAL | Status: DC

## 2019-11-01 MED ORDER — WARFARIN SODIUM 7.5 MG PO TABS
7.5000 mg | ORAL_TABLET | Freq: Once | ORAL | Status: AC
  Administered 2019-11-01: 7.5 mg via ORAL
  Filled 2019-11-01: qty 1

## 2019-11-01 MED ORDER — GENTAMICIN SULFATE 0.1 % EX CREA: 1.0000 "application " | TOPICAL_CREAM | Freq: Every day | CUTANEOUS | Status: DC

## 2019-11-01 MED ORDER — HEPARIN 1000 UNIT/ML FOR PERITONEAL DIALYSIS: 500.0000 [IU] | INTRAMUSCULAR | Status: DC | PRN

## 2019-11-01 MED ORDER — DELFLEX-LC/1.5% DEXTROSE 344 MOSM/L IP SOLN: INTRAPERITONEAL | Status: DC

## 2019-11-01 MED ORDER — VANCOMYCIN 100 MG/ML FOR DIALYSIS
Freq: Every day | Status: DC
  Filled 2019-11-01: qty 5000
  Filled 2019-11-01: qty 0
  Filled 2019-11-01: qty 5000

## 2019-11-01 MED ORDER — AMLODIPINE BESYLATE 5 MG PO TABS
5.0000 mg | ORAL_TABLET | Freq: Every day | ORAL | Status: DC
  Administered 2019-11-01 – 2019-11-02 (×2): 5 mg via ORAL
  Filled 2019-11-01 (×2): qty 1

## 2019-11-01 NOTE — Progress Notes (Signed)
  Progress Note    11/01/2019 8:59 AM * No surgery found *  Subjective:  No overnight issues  Vitals:   10/31/19 2212 11/01/19 0527  BP: (!) 179/87 (!) 152/87  Pulse: 89 85  Resp: 20 18  Temp: 98.7 F (37.1 C) 98.4 F (36.9 C)  SpO2: 97% 98%    Physical Exam: [id number] Nonlabored respirations  Bilateral radial pulses are palpable  CBC    Component Value Date/Time   WBC 1.5 (L) 11/01/2019 0533   RBC 2.54 (L) 11/01/2019 0533   HGB 8.6 (L) 11/01/2019 0533   HCT 24.7 (L) 11/01/2019 0533   PLT 85 (L) 11/01/2019 0533   MCV 97.2 11/01/2019 0533   MCH 33.9 11/01/2019 0533   MCHC 34.8 11/01/2019 0533   RDW 17.6 (H) 11/01/2019 0533   LYMPHSABS 0.7 11/01/2019 0533   MONOABS 0.2 11/01/2019 0533   EOSABS 0.0 11/01/2019 0533   BASOSABS 0.0 11/01/2019 0533    BMET    Component Value Date/Time   NA 133 (L) 11/01/2019 0533   K 3.2 (L) 11/01/2019 0533   CL 93 (L) 11/01/2019 0533   CO2 26 11/01/2019 0533   GLUCOSE 136 (H) 11/01/2019 0533   BUN 30 (H) 11/01/2019 0533   CREATININE 11.49 (H) 11/01/2019 0533   CALCIUM 8.3 (L) 11/01/2019 0533   GFRNONAA 5 (L) 11/01/2019 0533   GFRAA 5 (L) 11/01/2019 0533    INR    Component Value Date/Time   INR 1.2 11/01/2019 0533     Intake/Output Summary (Last 24 hours) at 11/01/2019 0859 Last data filed at 11/01/2019 0600 Gross per 24 hour  Intake 639.5 ml  Output 0 ml  Net 639.5 ml    Vein mapping reviewed appears to have suitable vein for fistula creation right upper extremity  Assessment/plan:  49 y.o. male is here with peritonitis from peritoneal dialysis.  Plan will be for conversion to hemodialysis.  Blood cultures are negative.  Will plan tunneled dialysis catheter and right arm AV fistula versus graft tomorrow in OR.   Maclaine Ahola C. Donzetta Matters, MD [facility] Office: [phone] Pager: [phone]  11/01/2019 8:59 AM

## 2019-11-01 NOTE — Progress Notes (Signed)
PROGRESS NOTE    James [name] [facility]  XTG:[id number] DOB: 02-Sep-1970 DOA: 10/28/2019 PCP: [name], MD   Brief Narrative:  Patient is a 49 year old male with history of end-stage renal disease status post renal transplant in 2008  , with failure in 2018 now on peritoneal dialysis, diabetes type 2, hypertension, hyperlipidemia who presented to the emergency department with 2 days complaints of abdomen pain, nausea, vomiting, diarrhea.  Also reported fever, chills.  Patient admitted for the management of possible spontaneous bacterial peritonitis.  Underwent paracentesis with confirmatory diagnosis of SBP.  Started on antibiotics.  Nephrology consulted and following. Plan is to stop PD and start on hemodialysis. C. difficile came out to be positive, started on oral vancomycin. Plan for tunneled dialysis catheter and right arm AV fistula/graft tomorrow by vascular surgery.  Assessment & Plan:   Principal Problem:   Spontaneous bacterial peritonitis ([place]) Active Problems:   ESRD on peritoneal dialysis ([place])   Diabetes mellitus without complication ([place])   Hypertension   Renal transplant recipient   SBP: Underwent paracentesis with findings of SBP.  He has been febrile, tachycardic on presentation. Peritoneal fluid culture,blood culture NGTD.  Gram stain did not show any organisms.  He had history of staphylococcal hemolyticus peritonitis back in 03/2019 and was treated with vancomycin.  We will continue vancomycin  for now.  We will change the antibiotic to doxycycline soon.  Abdominal pain/C diff/diarrhea: Presented with abdominal pain,diarrhoea CT abdomen/pelvis did not show any signs of bowel inflammation.Found to have Newly enlarged right external iliac lymph node, nonspecific. C. difficile antigen is positive, toxin is negative but patient was having diarrhea/abdominal pain so we will treat with oral vancomycin,10 days course.  ESRD on peritoneal dialysis: Nephrology following.  He has a  peritoneal  dialysis catheter.He has history of renal transplant which ultimately failed.  He follows with Dr. Percell Belt at [facility].  He takes prednisone, Prograf at home which are on hold due to possible underlying sepsis from SBP. Patient wants to stop peritoneal dialysis and go for hemodialysis now. Vascular surgery consulted for temporary dialysis catheter placement, AV graft/fistula.  Diabetes type 2: Continue sliding scale insulin.    He is on insulin at home.  He had episodes of hypoglycemia while hospitalized.  Hypertension: He has been hypertensive.  Continue his home medications.  Added amlodipine.  History of peripheral vascular disease: Status post recent right BKA.  Continue current meds  Pancytopenia: Chronic.  On looking at his previous blood works, he has chronic pancytopenia but it has worsened. S/p 1 unit of transfusion of  PRBC on this admission.  Normal iron, high ferritin, high vitamin B12.  Also has thrombocytopenia and leukopenia. Positive stool occult blood.  It could be associated with his colitis.  His hemoglobin has been  running in the range of 7 since last year, most likely this is associated with his ESRD.  He does not have any active bleeding at present.  He is also not a candidate for any kind of procedure due to possible underlying sepsis,C diff.    I will defer GI consultation. He has history of B-cell lymphoma, currently in remission.  He follows with hematology/oncology Dr. Cassell Clement, [facility].  I will recommend to follow-up with her as an outpatient in 2 weeks after discharge.  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 Will restart. Monitor INR  Generalized weakness, debility, deconditioning: We have put  request for PT.  DVT prophylaxis:Coumadin Code Status: Full Family Communication: None present at the bedside.  Patient states he does not have any family members. Status is: Inpatient  Remains inpatient  appropriate because:Ongoing diagnostic testing needed not appropriate for outpatient work up   Dispo: The patient is from: Home              Anticipated d/c is to: Home              Anticipated d/c date is: 2 days              Patient currently is not medically stable to d/c.        Consultants: Nephrology  Procedures: Paracentesis  Antimicrobials:  Anti-infectives (From admission, onward)   Start     Dose/Rate Route Frequency Ordered Stop   10/31/19 0830  vancomycin (VANCOCIN) 50 mg/mL oral solution 125 mg     125 mg Oral 3 times daily before meals & bedtime 10/31/19 0756     10/30/19 1000  valGANciclovir (VALCYTE) 450 MG tablet TABS 450 mg    Note to Pharmacy: Take one tablet (450 mg) by mouth on Monday, Wednesday, Friday before dialysis     450 mg Oral Every M-W-F 10/29/19 1332     10/29/19 1500  ceFEPIme (MAXIPIME) 1 g in sodium chloride 0.9 % 100 mL IVPB  Status:  Discontinued     1 g 200 mL/hr over 30 Minutes Intravenous Every 24 hours 10/29/19 1350 10/31/19 0759   10/29/19 0900  cefTRIAXone (ROCEPHIN) 2 g in sodium chloride 0.9 % 100 mL IVPB  Status:  Discontinued     2 g 200 mL/hr over 30 Minutes Intravenous Every 24 hours 10/29/19 0809 10/29/19 1332   10/28/19 1945  vancomycin (VANCOREADY) IVPB 1500 mg/300 mL     1,500 mg 150 mL/hr over 120 Minutes Intravenous  Once 10/28/19 1944 10/29/19 0132   10/28/19 1930  vancomycin (VANCOCIN) 1,500 mg in sodium chloride 0.9 % 500 mL IVPB  Status:  Discontinued     1,500 mg 250 mL/hr over 120 Minutes Intravenous  Once 10/28/19 1922 10/28/19 1943   10/28/19 1930  cefTAZidime (FORTAZ) 1 g in sodium chloride 0.9 % 100 mL IVPB  Status:  Discontinued     1 g 200 mL/hr over 30 Minutes Intravenous Every 24 hours 10/28/19 1922 10/29/19 0321   10/28/19 1922  vancomycin variable dose per unstable renal function (pharmacist dosing)      Does not apply See admin instructions 10/28/19 1922        Subjective:  Patient seen and examined  at the bedside this morning.  Hemodynamically stable.  Comfortable.  Lying on the bed.  Abdomen pain has improved today.  He is making more formed stools.  Denies any new complaints.  Objective: Vitals:   10/31/19 1913 10/31/19 1935 10/31/19 2212 11/01/19 0527  BP: (!) 167/101 (!) 175/100 (!) 179/87 (!) 152/87  Pulse: 93 90 89 85  Resp: 18 18 20 18   Temp: 98.3 F (36.8 C) 98.3 F (36.8 C) 98.7 F (37.1 C) 98.4 F (36.9 C)  TempSrc: Oral Oral    SpO2: 100% 100% 97% 98%  Weight:  72 kg    Height:        Intake/Output Summary (Last 24 hours) at 11/01/2019 0757 Last data filed at 11/01/2019 0600 Gross per 24 hour  Intake 639.5 ml  Output 0 ml  Net 639.5 ml   Filed Weights   10/28/19 1600 10/29/19 0317 10/31/19 1935  Weight:  68 kg 69.8 kg 72 kg    Examination:  General exam: Comfortable Respiratory system: Bilateral equal air entry, normal vesicular breath sounds, no wheezes or crackles  Cardiovascular system: S1 & S2 heard, RRR. No JVD, murmurs, rubs, gallops or clicks. Gastrointestinal system: Abdomen is nondistended, soft and nontender. No organomegaly or masses felt. Normal bowel sounds heard.  Peritoneal dialysis catheter Central nervous system: Alert and oriented. No focal neurological deficits. Extremities: No edema, no clubbing ,no cyanosis, right BKA  skin: No rashes, lesions or ulcers,no icterus ,no pallor    Data Reviewed: I have personally reviewed following labs and imaging studies  CBC: Recent Labs  Lab 10/28/19 2024 10/28/19 2024 10/29/19 0237 10/29/19 0436 10/29/19 2021 10/30/19 0536 10/31/19 0810 10/31/19 1839 11/01/19 0533  WBC 1.5*  --  1.8*  --   --  2.1* 1.7*  --  1.5*  NEUTROABS  --   --   --   --   --  1.1* 0.7*  --  0.6*  HGB 7.6*   < > 4.6*   < > 7.1* 7.5* 6.7* 8.3* 8.6*  HCT 24.0*   < > 14.2*   < > 20.9* 21.8* 19.4* 23.6* 24.7*  MCV 113.7*  --  110.1*  --   --  101.9* 100.5*  --  97.2  PLT 84*  --  105*  --   --  103* 92*  --  85*   <  > = values in this interval not displayed.   Basic Metabolic Panel: Recent Labs  Lab 10/28/19 1024 10/28/19 2024 10/30/19 0536 10/31/19 0810 11/01/19 0533  NA 137  --  135 131* 133*  K 3.6  --  4.0 3.6 3.2*  CL 97*  --  94* 94* 93*  CO2 25  --  26 24 26   GLUCOSE 57*  --  82 89 136*  BUN 31*  --  38* 35* 30*  CREATININE 13.42* 12.94* 13.29* 12.73* 11.49*  CALCIUM 8.8*  --  8.5* 7.9* 8.3*  PHOS  --   --  5.3*  --   --    GFR: Estimated Creatinine Clearance: 7.2 mL/min (A) (by C-G formula based on SCr of 11.49 mg/dL (H)). Liver Function Tests: Recent Labs  Lab 10/28/19 1024  AST 24  ALT 20  ALKPHOS 89  BILITOT 0.5  PROT 7.1  ALBUMIN 2.5*   Recent Labs  Lab 10/28/19 1024  LIPASE 24   No results for input(s): AMMONIA in the last 168 hours. Coagulation Profile: Recent Labs  Lab 10/31/19 0810 11/01/19 0533  INR 1.6* 1.2   Cardiac Enzymes: No results for input(s): CKTOTAL, CKMB, CKMBINDEX, TROPONINI in the last 168 hours. BNP (last 3 results) No results for input(s): PROBNP in the last 8760 hours. HbA1C: No results for input(s): HGBA1C in the last 72 hours. CBG: Recent Labs  Lab 10/31/19 0644 10/31/19 1118 10/31/19 1705 10/31/19 2228 11/01/19 0645  GLUCAP 101* 104* 141* 197* 108*   Lipid Profile: No results for input(s): CHOL, HDL, LDLCALC, TRIG, CHOLHDL, LDLDIRECT in the last 72 hours. Thyroid Function Tests: No results for input(s): TSH, T4TOTAL, FREET4, T3FREE, THYROIDAB in the last 72 hours. Anemia Panel: Recent Labs    10/30/19 0536  VITAMINB12 1,414*  FERRITIN 4,871*  TIBC 119*  IRON 64   Sepsis Labs: Recent Labs  Lab 10/28/19 2042 10/29/19 0436  LATICACIDVEN 1.5 1.7    Recent Results (from the past 240 hour(s))  SARS CORONAVIRUS 2 (TAT 6-24 HRS) Nasopharyngeal Nasopharyngeal  Swab     Status: None   Collection Time: 10/28/19  6:27 PM   Specimen: Nasopharyngeal Swab  Result Value Ref Range Status   SARS Coronavirus 2 NEGATIVE  NEGATIVE Final    Comment: (NOTE) SARS-CoV-2 target nucleic acids are NOT DETECTED. The SARS-CoV-2 RNA is generally detectable in upper and lower respiratory specimens during the acute phase of infection. Negative results do not preclude SARS-CoV-2 infection, do not rule out co-infections with other pathogens, and should not be used as the sole basis for treatment or other patient management decisions. Negative results must be combined with clinical observations, patient history, and epidemiological information. The expected result is Negative. Fact Sheet for Patients: SugarRoll.be Fact Sheet for Healthcare Providers: https://www.woods-mathews.com/ This test is not yet approved or cleared by the [place] FDA and  has been authorized for detection and/or diagnosis of SARS-CoV-2 by FDA under an Emergency Use Authorization (EUA). This EUA will remain  in effect (meaning this test can be used) for the duration of the COVID-19 declaration under Section 56 4(b)(1) of the Act, 21 U.S.C. section 360bbb-3(b)(1), unless the authorization is terminated or revoked sooner. Performed at [facility], [place] [address] [place], [place] [address]   Gram stain     Status: None   Collection Time: 10/28/19  9:40 PM   Specimen: Body Fluid  Result Value Ref Range Status   Specimen Description FLUID PERITONEAL DIALYSATE  Final   Special Requests NONE  Final   Gram Stain   Final    MODERATE WBC PRESENT, PREDOMINANTLY PMN NO ORGANISMS SEEN Performed at [facility], [address], [place], [place] [address]    Report Status 10/29/2019 FINAL  Final  Culture, body fluid-bottle     Status: None (Preliminary result)   Collection Time: 10/28/19  9:40 PM   Specimen: Fluid  Result Value Ref Range Status   Specimen Description FLUID PERITONEAL DIALYSATE  Final   Special Requests   Final    BOTTLES DRAWN AEROBIC AND ANAEROBIC Blood Culture adequate  volume   Culture   Final    NO GROWTH 2 DAYS Performed at [facility], [address], [place], [place] [address]    Report Status PENDING  Incomplete  Culture, blood (routine x 2)     Status: None (Preliminary result)   Collection Time: 10/29/19 12:46 AM   Specimen: BLOOD  Result Value Ref Range Status   Specimen Description BLOOD RIGHT HAND  Final   Special Requests   Final    BOTTLES DRAWN AEROBIC AND ANAEROBIC Blood Culture adequate volume   Culture   Final    NO GROWTH 2 DAYS Performed at [facility], [place] [address] [place], [place] [address]    Report Status PENDING  Incomplete  Culture, blood (routine x 2)     Status: None (Preliminary result)   Collection Time: 10/29/19 12:47 AM   Specimen: BLOOD  Result Value Ref Range Status   Specimen Description BLOOD RIGHT FINGER  Final   Special Requests   Final    BOTTLES DRAWN AEROBIC ONLY Blood Culture adequate volume   Culture   Final    NO GROWTH 2 DAYS Performed at [facility], [place] [address], [place], [place] [address]    Report Status PENDING  Incomplete  Gastrointestinal Panel by PCR , Stool     Status: None   Collection Time: 10/30/19 12:43 PM   Specimen: Stool  Result Value Ref  Range Status   Campylobacter species NOT DETECTED NOT DETECTED Final   Plesimonas shigelloides NOT DETECTED NOT DETECTED Final   Salmonella species NOT DETECTED NOT DETECTED Final   Yersinia enterocolitica NOT DETECTED NOT DETECTED Final   Vibrio species NOT DETECTED NOT DETECTED Final   Vibrio cholerae NOT DETECTED NOT DETECTED Final   Enteroaggregative E coli (EAEC) NOT DETECTED NOT DETECTED Final   Enteropathogenic E coli (EPEC) NOT DETECTED NOT DETECTED Final   Enterotoxigenic E coli (ETEC) NOT DETECTED NOT DETECTED Final   Shiga like toxin producing E coli (STEC) NOT DETECTED NOT DETECTED Final   Shigella/Enteroinvasive E coli (EIEC) NOT DETECTED NOT DETECTED Final   Cryptosporidium NOT DETECTED NOT DETECTED Final    Cyclospora cayetanensis NOT DETECTED NOT DETECTED Final   Entamoeba histolytica NOT DETECTED NOT DETECTED Final   Giardia lamblia NOT DETECTED NOT DETECTED Final   Adenovirus F40/41 NOT DETECTED NOT DETECTED Final   Astrovirus NOT DETECTED NOT DETECTED Final   Norovirus GI/GII NOT DETECTED NOT DETECTED Final   Rotavirus A NOT DETECTED NOT DETECTED Final   Sapovirus (I, II, IV, and V) NOT DETECTED NOT DETECTED Final    Comment: Performed at [facility], [address] [place], [place] [address]  C Difficile Quick Screen w PCR reflex     Status: Abnormal   Collection Time: 10/30/19 12:43 PM   Specimen: Stool  Result Value Ref Range Status   C Diff antigen POSITIVE (A) NEGATIVE Final   C Diff toxin NEGATIVE NEGATIVE Final   C Diff interpretation Results are indeterminate. See PCR results.  Final    Comment: Performed at [facility], [place] [address] [place], [place] [address]  C. Diff by PCR, Reflexed     Status: Abnormal   Collection Time: 10/30/19 12:43 PM  Result Value Ref Range Status   Toxigenic C. Difficile by PCR POSITIVE (A) NEGATIVE Final    Comment: Positive for toxigenic C. difficile with little to no toxin production. Only treat if clinical presentation suggests symptomatic illness. Performed at [facility], [place] [address], [place], [place] [address]          Radiology Studies: VAS [place] UPPER EXT VEIN MAPPING (PRE-OP AVF)  Result Date: 10/31/2019 UPPER EXTREMITY VEIN MAPPING  Indications: Pre-dialysis access. History: ESRD on peritoneal dialysis. Previous left upper arm fistula that was          usable and right upper extremity fistula that was never usable. History          of renal transplant 2009.  Limitations: IV, body habitus Comparison Study: No prior study on file Performing Technologist: Sharion Dove RVS  Examination Guidelines: A complete evaluation includes B-mode imaging, spectral Doppler, color Doppler, and power Doppler as needed of  all accessible portions of each vessel. Bilateral testing is considered an integral part of a complete examination. Limited examinations for reoccurring indications may be performed as noted. +-----------------+-------------+----------+--------------+ Right Cephalic   Diameter (cm)Depth (cm)   Findings    +-----------------+-------------+----------+--------------+ Prox upper arm       0.36        0.33                  +-----------------+-------------+----------+--------------+ Mid upper arm        0.30        0.47                  +-----------------+-------------+----------+--------------+ Dist upper arm  0.29        0.27     branching    +-----------------+-------------+----------+--------------+ Antecubital fossa    0.19        0.30                  +-----------------+-------------+----------+--------------+ Prox forearm         0.17        0.39                  +-----------------+-------------+----------+--------------+ Mid forearm          0.15        0.37     branching    +-----------------+-------------+----------+--------------+ Wrist                                   not visualized +-----------------+-------------+----------+--------------+ +-----------------+-------------+----------+--------------+ Right Basilic    Diameter (cm)Depth (cm)   Findings    +-----------------+-------------+----------+--------------+ Prox upper arm       0.55        0.65       origin     +-----------------+-------------+----------+--------------+ Mid upper arm        0.46        0.60                  +-----------------+-------------+----------+--------------+ Dist upper arm       0.46        0.48     branching    +-----------------+-------------+----------+--------------+ Antecubital fossa    0.37        0.54                  +-----------------+-------------+----------+--------------+ Prox forearm         0.22        0.23     branching     +-----------------+-------------+----------+--------------+ Mid forearm          0.22        0.24                  +-----------------+-------------+----------+--------------+ Wrist                                   not visualized +-----------------+-------------+----------+--------------+ +-----------------+-------------+----------+--------------+ Left Cephalic    Diameter (cm)Depth (cm)   Findings    +-----------------+-------------+----------+--------------+ Mid upper arm                           not visualized +-----------------+-------------+----------+--------------+ Dist upper arm                          not visualized +-----------------+-------------+----------+--------------+ Antecubital fossa    0.45        0.22     branching    +-----------------+-------------+----------+--------------+ Prox forearm         0.26        0.35     branching    +-----------------+-------------+----------+--------------+ Mid forearm          0.20        0.64                  +-----------------+-------------+----------+--------------+ Wrist  not visualized +-----------------+-------------+----------+--------------+ +-----------------+-------------+----------+--------+ Left Basilic     Diameter (cm)Depth (cm)Findings +-----------------+-------------+----------+--------+ Mid upper arm       735.00       1.19    origin  +-----------------+-------------+----------+--------+ Dist upper arm       0.68        0.75            +-----------------+-------------+----------+--------+ Antecubital fossa    0.24        0.63            +-----------------+-------------+----------+--------+ Prox forearm         0.30        0.27            +-----------------+-------------+----------+--------+ Mid forearm          0.23        0.47            +-----------------+-------------+----------+--------+ Wrist                0.27        0.31             +-----------------+-------------+----------+--------+ *See table(s) above for measurements and observations.  Diagnosing physician:    Preliminary         Scheduled Meds: . sodium chloride   Intravenous Once  . calcitRIOL  0.75 mcg Oral Daily  . darbepoetin (ARANESP) injection - NON-DIALYSIS  150 mcg Subcutaneous Q Thu-1800  . doxazosin  4 mg Oral QHS  . ferrous sulfate  325 mg Oral BID WC  . folic acid  1 mg Oral Daily  . gentamicin cream  1 application Topical Daily  . insulin aspart  0-5 Units Subcutaneous QHS  . insulin aspart  0-6 Units Subcutaneous TID WC  . lisinopril  20 mg Oral Daily  . metoprolol tartrate  100 mg Oral BID  . valGANciclovir  450 mg Oral Q M,W,F  . vancomycin  125 mg Oral TID AC & HS  . vancomycin variable dose per unstable renal function (pharmacist dosing)   Does not apply See admin instructions  . Warfarin - Pharmacist Dosing Inpatient   Does not apply q1600   Continuous Infusions: . dialysis solution 1.5% low-MG/low-CA       LOS: 4 days    Time spent:35 mins. More than 50% of that time was spent in counseling and/or coordination of care.      Shelly Coss, MD [facility] P4/05/2020, 7:57 AM

## 2019-11-01 NOTE — Plan of Care (Signed)
  Problem: Activity: Goal: Risk for activity intolerance will decrease Outcome: Progressing   

## 2019-11-01 NOTE — Progress Notes (Addendum)
Subjective:  abd pain  Improved ,some nausea  tolerating CCPD .plans for VVS tomorr [facility] cath and AVF vs avgg  placement   Objective Vital signs in last 24 hours: Vitals:   10/31/19 1935 10/31/19 2212 11/01/19 0527 11/01/19 0700  BP: (!) 175/100 (!) 179/87 (!) 152/87 (!) 156/82  Pulse: 90 89 85 85  Resp: 18 20 18 18   Temp: 98.3 F (36.8 C) 98.7 F (37.1 C) 98.4 F (36.9 C) 98.4 F (36.9 C)  TempSrc: Oral   Oral  SpO2: 100% 97% 98%   Weight: 72 kg   71.2 kg  Height:       Weight change:   Physical Exam: General:alert NAD Heart:RRR no rub Lungs:CTA Abdomen:BS pos , Soft, Tender no rebound, PD cath Extremities:no pedal edema Dialysis Access:pd cath  Dialysis Orders: [facility] [place] CCPD 9H 5 cycles 2.3L Last fill midday 1.5L  Problem/Plan:  1.Abdominal pain/Likely SBP: CT unrevealing.PD fluid with 4100 TNCs> 849 (4/07). PD cultures= no growth to date   and BC . Vanc empirically for now -in PD  Fluids  Per Phar consult .  Prior peritonitis 03/2019 was staph hemolyticus.Holding gram neg coverage given hx Cdif and also last peritonitis was staph. Send sample for cell count in am.  2. ESRDon CCPD. (3rd peritonitis)last pd tonight and  transition  to HD ,,tomorrow =Dr Donzetta Matters notes  [facility] cath and AVF vs avgg  Placement needs clip for OP [place] unit  3. HTN/volume-BP up some. improved with bp meds and 2.5% pd  Bag,No gross overload on exam. Lisinopril 10 mg  q day/ Metoprolol 100 mg bid  / amlodipine 5mg  added fu  bp closely with HD starting  4. ABLA/Anemia CKD -Am hgb 8.6. S/p 1 unit prbcs.for  hgb  6.7 . . FOBT+ HO C Diff .Last ESA 3/16 at outpatient center. Resume ESA here. Aranesp 138mcg  subcut  q tues ( given 4/08 )  5.MBD of CKD -Continue home binders/calcitriol when eating. Phos5.3PTHpend . 6. HO C Diff = po vanco  6.Nutrition.last Alb 2.5 ,refused Nepro,Prot supp  7. H/oB cell lymphoma  With Pancytopenia - in Remission / He follows with  hematology/oncology Dr. Cassell Clement, [facility].   8. 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  [facility] 11/01/2019, 2:00 PM     Labs: Basic Metabolic Panel: Recent Labs  Lab 10/30/19 0536 10/31/19 0810 11/01/19 0533  NA 135 131* 133*  K 4.0 3.6 3.2*  CL 94* 94* 93*  CO2 26 24 26   GLUCOSE 82 89 136*  BUN 38* 35* 30*  CREATININE 13.29* 12.73* 11.49*  CALCIUM 8.5* 7.9* 8.3*  PHOS 5.3*  --   --    Liver Function Tests: Recent Labs  Lab 10/28/19 1024  AST 24  ALT 20  ALKPHOS 89  BILITOT 0.5  PROT 7.1  ALBUMIN 2.5*   Recent Labs  Lab 10/28/19 1024  LIPASE 24   No results for input(s): AMMONIA in the last 168 hours. CBC: Recent Labs  Lab 10/28/19 2024 10/28/19 2024 10/29/19 0237 10/29/19 0436 10/30/19 0536 10/30/19 0536 10/31/19 0810 10/31/19 1839 11/01/19 0533  WBC 1.5*   < > 1.8*   < > 2.1*  --  1.7*  --  1.5*  NEUTROABS  --   --   --   --  1.1*  --  0.7*  --  0.6*  HGB 7.6*   < > 4.6*   < > 7.5*   < > 6.7* 8.3* 8.6*  HCT 24.0*   < > 14.2*   < > 21.8*  20.9*   < > 19.4* 23.6* 24.7*  MCV 113.7*  --  110.1*  --  101.9*  --  100.5*  --  97.2  PLT 84*   < > 105*   < > 103*  --  92*  --  85*   < > = values in this interval not displayed.   Cardiac Enzymes: No results for input(s): CKTOTAL, CKMB, CKMBINDEX, TROPONINI in the last 168 hours. CBG: Recent Labs  Lab 10/31/19 1118 10/31/19 1705 10/31/19 2228 11/01/19 0645 11/01/19 1127  GLUCAP 104* 141* 197* 108* 89    Studies/Results: VAS [place] UPPER EXT VEIN MAPPING (PRE-OP AVF)  Result Date: 10/31/2019 UPPER EXTREMITY VEIN MAPPING  Indications: Pre-dialysis access. History: ESRD on peritoneal dialysis. Previous left upper arm fistula that was          usable and right upper extremity fistula that was never  usable. History          of renal transplant 2009.  Limitations: IV, body habitus Comparison Study: No prior study on file Performing Technologist: Sharion Dove RVS  Examination Guidelines: A complete evaluation includes B-mode imaging, spectral Doppler, color Doppler, and power Doppler as needed of all accessible portions of each vessel. Bilateral testing is considered an integral part of a complete examination. Limited examinations for reoccurring indications may be performed as noted. +-----------------+-------------+----------+--------------+ Right Cephalic   Diameter (cm)Depth (cm)   Findings    +-----------------+-------------+----------+--------------+ Prox upper arm       0.36        0.33                  +-----------------+-------------+----------+--------------+ Mid upper arm        0.30        0.47                  +-----------------+-------------+----------+--------------+ Dist upper arm       0.29        0.27     branching    +-----------------+-------------+----------+--------------+ Antecubital fossa    0.19        0.30                  +-----------------+-------------+----------+--------------+ Prox forearm         0.17        0.39                  +-----------------+-------------+----------+--------------+ Mid forearm          0.15        0.37     branching    +-----------------+-------------+----------+--------------+ Wrist                                   not visualized +-----------------+-------------+----------+--------------+ +-----------------+-------------+----------+--------------+ Right Basilic    Diameter (cm)Depth (cm)   Findings    +-----------------+-------------+----------+--------------+ Prox upper arm       0.55        0.65       origin     +-----------------+-------------+----------+--------------+ Mid upper arm        0.46        0.60                  +-----------------+-------------+----------+--------------+  Dist upper  arm       0.46        0.48     branching    +-----------------+-------------+----------+--------------+ Antecubital fossa    0.37        0.54                  +-----------------+-------------+----------+--------------+ Prox forearm         0.22        0.23     branching    +-----------------+-------------+----------+--------------+ Mid forearm          0.22        0.24                  +-----------------+-------------+----------+--------------+ Wrist                                   not visualized +-----------------+-------------+----------+--------------+ +-----------------+-------------+----------+--------------+ Left Cephalic    Diameter (cm)Depth (cm)   Findings    +-----------------+-------------+----------+--------------+ Mid upper arm                           not visualized +-----------------+-------------+----------+--------------+ Dist upper arm                          not visualized +-----------------+-------------+----------+--------------+ Antecubital fossa    0.45        0.22     branching    +-----------------+-------------+----------+--------------+ Prox forearm         0.26        0.35     branching    +-----------------+-------------+----------+--------------+ Mid forearm          0.20        0.64                  +-----------------+-------------+----------+--------------+ Wrist                                   not visualized +-----------------+-------------+----------+--------------+ +-----------------+-------------+----------+--------+ Left Basilic     Diameter (cm)Depth (cm)Findings +-----------------+-------------+----------+--------+ Mid upper arm       735.00       1.19    origin  +-----------------+-------------+----------+--------+ Dist upper arm       0.68        0.75            +-----------------+-------------+----------+--------+ Antecubital fossa    0.24        0.63             +-----------------+-------------+----------+--------+ Prox forearm         0.30        0.27            +-----------------+-------------+----------+--------+ Mid forearm          0.23        0.47            +-----------------+-------------+----------+--------+ Wrist                0.27        0.31            +-----------------+-------------+----------+--------+ *See table(s) above for measurements and observations.  Diagnosing physician:    Preliminary    Medications: . dialysis solution 1.5% low-MG/low-CA    . dialysis solution 2.5% low-MG/low-CA    . dianeal solution for CAPD/CCPD  with additives     . sodium chloride   Intravenous Once  . amLODipine  5 mg Oral Daily  . calcitRIOL  0.75 mcg Oral Daily  . darbepoetin (ARANESP) injection - NON-DIALYSIS  150 mcg Subcutaneous Q Thu-1800  . doxazosin  4 mg Oral QHS  . ferrous sulfate  325 mg Oral BID WC  . folic acid  1 mg Oral Daily  . gentamicin cream  1 application Topical Daily  . gentamicin cream  1 application Topical Daily  . insulin aspart  0-5 Units Subcutaneous QHS  . insulin aspart  0-6 Units Subcutaneous TID WC  . lisinopril  20 mg Oral Daily  . metoprolol tartrate  100 mg Oral BID  . valGANciclovir  450 mg Oral Q M,W,F  . vancomycin  125 mg Oral TID AC & HS  . warfarin  7.5 mg Oral ONCE-1600  . Warfarin - Pharmacist Dosing Inpatient   Does not apply [id number]

## 2019-11-01 NOTE — Progress Notes (Signed)
ANTICOAGULATION CONSULT NOTE - Follow Up Consult  Pharmacy Consult for Warfarin Indication: Splenic infarcts  Allergies  Allergen Reactions  . Tramadol Hcl Shortness Of Breath and Other (See Comments)    Tramadol HCl = Shortness of breath and Tramadol = Rash  . Nsaids Other (See Comments)    Was told by MD to not take these  . Darvon [Propoxyphene] Rash and Other (See Comments)    Broke out on eyelid   . Tramadol Rash and Other (See Comments)    Tramadol = Rash and Tramadol HCl = Shortness of breath    Patient Measurements: Height: 5\' 6"  (167.6 cm) Weight: 72 kg (158 lb 11.7 oz) IBW/kg (Calculated) : 63.8  Vital Signs: Temp: 98.4 F (36.9 C) (04/11 0527) BP: 152/87 (04/11 0527) Pulse Rate: 85 (04/11 0527)  Labs: Recent Labs    10/30/19 0536 10/30/19 0536 10/31/19 0810 10/31/19 0810 10/31/19 1839 11/01/19 0533  HGB 7.5*   < > 6.7*   < > 8.3* 8.6*  HCT 21.8*   < > 19.4*  --  23.6* 24.7*  PLT 103*  --  92*  --   --  85*  LABPROT  --   --  19.3*  --   --  15.3*  INR  --   --  1.6*  --   --  1.2  CREATININE 13.29*  --  12.73*  --   --  11.49*   < > = values in this interval not displayed.    Estimated Creatinine Clearance: 7.2 mL/min (A) (by C-G formula based on SCr of 11.49 mg/dL (H)).   Assessment: 49 YOM on Warfarin PTA for hx splenic infarcts held on admission due to low Hgb. No INR taken on admit. PTA dose listed as 5 mg daily EXCEPT for 7.5 mg on Tues/Thurs. Pharmacy consulted to resume Warfarin dosing 4/10.  INR today remains SUBtherapeutic and downtrending (1.2 << 1.6, goal of 2-3). Hgb up to 8.6 s/p 1u PRBC, plts 85 - will watch. Will increase the warfarin dose this evening. No bleeding noted at this time.  Goal of Therapy:  INR 2-3 Monitor platelets by anticoagulation protocol: Yes   Plan:  - Warfarin 7.5 mg x 1 dose today - Daily PT/INR, CBC q72h - Will continue to monitor for any signs/symptoms of bleeding and will follow up with PT/INR in the a.m.     Thank you for allowing pharmacy to be a part of this patient's care.  Alycia Rossetti, PharmD, BCPS Clinical Pharmacist Clinical phone for 11/01/2019: [phone] 11/01/2019 8:54 AM   **Pharmacist phone directory can now be found on [email or web address] ([email or web address]).  Listed under [place].

## 2019-11-01 NOTE — Evaluation (Signed)
Physical Therapy Evaluation Patient Details Name: James [name] MRN: [id number] DOB: November 04, 1970 Today's Date: 11/01/2019   History of Present Illness  Patient is a 50 year old male with history of end-stage renal disease status post renal transplant in 2008  , with failure in 2018 now on peritoneal dialysis, diabetes type 2, R BKA, hypertension, hyperlipidemia who presented to the emergency department with 2 days complaints of abdomen pain, nausea, vomiting, diarrhea.  Also reported fever, chills.  Patient admitted for the management of possible spontaneous bacterial peritonitis.  Underwent paracentesis with confirmatory diagnosis of SBP.  Clinical Impression   Pt admitted with above diagnosis. Comes from home where he lives independently alone in one-floor apt with a small threshold to enter; Currently manages short household distances with RW, has a wheelchair he uses as well; [place] assists a few days per week; Presents to PT with decr functional mobility, and decr activity tolerance; Became dizzy after taking a few hop-steps with RW, unable to capture standing BP -- will plan for orthostatics next session; Pt currently with functional limitations due to the deficits listed below (see PT Problem List). Pt will benefit from skilled PT to increase their independence and safety with mobility to allow discharge to the venue listed below.    Please consider performing HD in HD recliner.     Follow Up Recommendations Home health PT;Other (comment)(Can we look into increasing his personal care attentdant time, knowing that he will need assistance getting to /from HD at least 3x/week?)    Equipment Recommendations  None recommended by PT    Recommendations for Other Services       Precautions / Restrictions Precautions Precautions: Fall Precaution Comments: dizzy after 1-2 minutes in standing      Mobility  Bed Mobility Overal bed mobility: Independent                Transfers Overall transfer level: Needs assistance Equipment used: Rolling walker (2 wheeled) Transfers: Sit to/from Stand Sit to Stand: Min guard         General transfer comment: Pulls up to stand on RW despite cues for hand placement and safety; minguard for safety  Ambulation/Gait Ambulation/Gait assistance: Min guard Gait Distance (Feet): 3 Feet Assistive device: Rolling walker (2 wheeled) Gait Pattern/deviations: (hop-steps)     General Gait Details: Overall steady hop-steps, with good use of the RW for support; after a few steps, became lightheaded, and needed to sit; unableto capture a standing BP  Stairs            Wheelchair Mobility    Modified Rankin (Stroke Patients Only)       Balance                                             Pertinent Vitals/Pain Pain Assessment: Faces Pain Score: 10-Worst pain ever Faces Pain Scale: Hurts little more Pain Location: Abdomen Pain Descriptors / Indicators: Discomfort Pain Intervention(s): Monitored during session    Home Living Family/patient expects to be discharged to:: Private residence Living Arrangements: Alone Available Help at Discharge: Personal care attendant;Family;Available PRN/intermittently Type of Home: Apartment Home Access: Stairs to enter   Entrance Stairs-Number of Steps: 1(threshold)   Home Equipment: Walker - 2 wheels;Wheelchair - manual;Tub bench      Prior Function Level of Independence: Needs assistance   Gait / Transfers Assistance Needed: Hops with  RW short, household distances  ADL's / Land Needed: independent with basic ADLs; PCA assist with household management        Hand Dominance        Extremity/Trunk Assessment   Upper Extremity Assessment Upper Extremity Assessment: Overall WFL for tasks assessed    Lower Extremity Assessment Lower Extremity Assessment: Generalized weakness;RLE deficits/detail RLE Deficits / Details:  Previous BKA (per pt, BKA was in Jan); noted scabbing around incision; knee ROM full; did not get the opportunity to assess hamstring length       Communication   Communication: No difficulties  Cognition Arousal/Alertness: Awake/alert Behavior During Therapy: WFL for tasks assessed/performed(reluctant to participate) Overall Cognitive Status: Within Functional Limits for tasks assessed                                 General Comments: Required repetitions of role of PT in the hospital, why doctors ordered PT, why assessment of functional mobility and activity tolerance is needed for dc recommendations      General Comments General comments (skin integrity, edema, etc.): REcommend orthsotatics next session    Exercises     Assessment/Plan    PT Assessment Patient needs continued PT services  PT Problem List Decreased strength;Decreased range of motion;Decreased activity tolerance;Decreased balance;Decreased mobility;Decreased safety awareness;Other (comment)(decr functional capacity)       PT Treatment Interventions DME instruction;Gait training;Stair training;Functional mobility training;Therapeutic activities;Therapeutic exercise;Balance training;Patient/family education    PT Goals (Current goals can be found in the Care Plan section)  Acute Rehab PT Goals Patient Stated Goal: go home PT Goal Formulation: With patient Time For Goal Achievement: 11/15/19 Potential to Achieve Goals: Good    Frequency Min 3X/week   Barriers to discharge        Co-evaluation               AM-PAC PT "6 Clicks" Mobility  Outcome Measure Help needed turning from your back to your side while in a flat bed without using bedrails?: None Help needed moving from lying on your back to sitting on the side of a flat bed without using bedrails?: None Help needed moving to and from a bed to a chair (including a wheelchair)?: A Little Help needed standing up from a chair using  your arms (e.g., wheelchair or bedside chair)?: A Little Help needed to walk in hospital room?: A Little Help needed climbing 3-5 steps with a railing? : A Lot 6 Click Score: 19    End of Session Equipment Utilized During Treatment: Gait belt Activity Tolerance: Other (comment)(limited by lightheadedness in standing) Patient left: in bed;with call bell/phone within reach;with bed alarm set Nurse Communication: Mobility status PT Visit Diagnosis: Unsteadiness on feet (R26.81);Other abnormalities of gait and mobility (R26.89);Dizziness and giddiness (R42)    Time: 0924-1000 PT Time Calculation (min) (ACUTE ONLY): 36 min   Charges:   PT Evaluation $PT Eval Moderate Complexity: 1 Mod PT Treatments $Therapeutic Activity: 8-22 mins        Roney Marion, PT  Acute Rehabilitation Services Pager [phone] Office [phone]   [place] 11/01/2019, 12:58 PM

## 2019-11-01 NOTE — H&P (View-Only) (Signed)
  Progress Note    11/01/2019 8:59 AM * No surgery found *  Subjective:  No overnight issues  Vitals:   10/31/19 2212 11/01/19 0527  BP: (!) 179/87 (!) 152/87  Pulse: 89 85  Resp: 20 18  Temp: 98.7 F (37.1 C) 98.4 F (36.9 C)  SpO2: 97% 98%    Physical Exam: [id number] Nonlabored respirations  Bilateral radial pulses are palpable  CBC    Component Value Date/Time   WBC 1.5 (L) 11/01/2019 0533   RBC 2.54 (L) 11/01/2019 0533   HGB 8.6 (L) 11/01/2019 0533   HCT 24.7 (L) 11/01/2019 0533   PLT 85 (L) 11/01/2019 0533   MCV 97.2 11/01/2019 0533   MCH 33.9 11/01/2019 0533   MCHC 34.8 11/01/2019 0533   RDW 17.6 (H) 11/01/2019 0533   LYMPHSABS 0.7 11/01/2019 0533   MONOABS 0.2 11/01/2019 0533   EOSABS 0.0 11/01/2019 0533   BASOSABS 0.0 11/01/2019 0533    BMET    Component Value Date/Time   NA 133 (L) 11/01/2019 0533   K 3.2 (L) 11/01/2019 0533   CL 93 (L) 11/01/2019 0533   CO2 26 11/01/2019 0533   GLUCOSE 136 (H) 11/01/2019 0533   BUN 30 (H) 11/01/2019 0533   CREATININE 11.49 (H) 11/01/2019 0533   CALCIUM 8.3 (L) 11/01/2019 0533   GFRNONAA 5 (L) 11/01/2019 0533   GFRAA 5 (L) 11/01/2019 0533    INR    Component Value Date/Time   INR 1.2 11/01/2019 0533     Intake/Output Summary (Last 24 hours) at 11/01/2019 0859 Last data filed at 11/01/2019 0600 Gross per 24 hour  Intake 639.5 ml  Output 0 ml  Net 639.5 ml    Vein mapping reviewed appears to have suitable vein for fistula creation right upper extremity  Assessment/plan:  49 y.o. male is here with peritonitis from peritoneal dialysis.  Plan will be for conversion to hemodialysis.  Blood cultures are negative.  Will plan tunneled dialysis catheter and right arm AV fistula versus graft tomorrow in OR.   Denicia Pagliarulo C. Donzetta Matters, MD [facility] Office: [phone] Pager: [phone]  11/01/2019 8:59 AM

## 2019-11-02 ENCOUNTER — Encounter (HOSPITAL_COMMUNITY)
Admission: EM | Disposition: A | Discharge status: Home / Self Care | Payer: Self-pay | Source: Home / Self Care | Attending: Internal Medicine

## 2019-11-02 ENCOUNTER — Encounter (HOSPITAL_COMMUNITY): Payer: Self-pay | Admitting: Internal Medicine

## 2019-11-02 ENCOUNTER — Inpatient Hospital Stay (HOSPITAL_COMMUNITY): Payer: Medicare Other

## 2019-11-02 DIAGNOSIS — N186 End stage renal disease: Secondary | ICD-10-CM

## 2019-11-02 DIAGNOSIS — K652 Spontaneous bacterial peritonitis: Secondary | ICD-10-CM | POA: Diagnosis not present

## 2019-11-02 HISTORY — PX: INSERTION OF DIALYSIS CATHETER: SHX1324

## 2019-11-02 HISTORY — PX: BASCILIC VEIN TRANSPOSITION: SHX5742

## 2019-11-02 HISTORY — PX: VENOGRAM: SHX5497

## 2019-11-02 HISTORY — PX: AV FISTULA PLACEMENT: SHX1204

## 2019-11-02 LAB — BASIC METABOLIC PANEL
Anion gap: 14 (ref 5–15)
BUN: 28 mg/dL — ABNORMAL HIGH (ref 6–20)
CO2: 28 mmol/L (ref 22–32)
Calcium: 8.4 mg/dL — ABNORMAL LOW (ref 8.9–10.3)
Chloride: 93 mmol/L — ABNORMAL LOW (ref 98–111)
Creatinine, Ser: 11.96 mg/dL — ABNORMAL HIGH (ref 0.61–1.24)
GFR calc Af Amer: 5 mL/min — ABNORMAL LOW (ref >60)
GFR calc non Af Amer: 4 mL/min — ABNORMAL LOW (ref >60)
Glucose, Bld: 118 mg/dL — ABNORMAL HIGH (ref 70–99)
Potassium: 3.3 mmol/L — ABNORMAL LOW (ref 3.5–5.1)
Sodium: 135 mmol/L (ref 135–145)

## 2019-11-02 LAB — CBC WITH DIFFERENTIAL/PLATELET
Abs Immature Granulocytes: 0.02 10*3/uL (ref 0.00–0.07)
Basophils Absolute: 0 10*3/uL (ref 0.0–0.1)
Basophils Relative: 0 %
Eosinophils Absolute: 0 10*3/uL (ref 0.0–0.5)
Eosinophils Relative: 1 %
HCT: 27.1 % — ABNORMAL LOW (ref 39.0–52.0)
Hemoglobin: 9.2 g/dL — ABNORMAL LOW (ref 13.0–17.0)
Immature Granulocytes: 1 %
Lymphocytes Relative: 38 %
Lymphs Abs: 0.7 10*3/uL (ref 0.7–4.0)
MCH: 33.8 pg (ref 26.0–34.0)
MCHC: 33.9 g/dL (ref 30.0–36.0)
MCV: 99.6 fL (ref 80.0–100.0)
Monocytes Absolute: 0.2 10*3/uL (ref 0.1–1.0)
Monocytes Relative: 13 %
Neutro Abs: 0.9 10*3/uL — ABNORMAL LOW (ref 1.7–7.7)
Neutrophils Relative %: 47 %
Platelets: 91 10*3/uL — ABNORMAL LOW (ref 150–400)
RBC: 2.72 MIL/uL — ABNORMAL LOW (ref 4.22–5.81)
RDW: 17.5 % — ABNORMAL HIGH (ref 11.5–15.5)
Smear Review: DECREASED
WBC: 1.9 10*3/uL — ABNORMAL LOW (ref 4.0–10.5)
nRBC: 0 % (ref 0.0–0.2)

## 2019-11-02 LAB — GLUCOSE, CAPILLARY
Glucose-Capillary: 81 mg/dL (ref 70–99)
Glucose-Capillary: 120 mg/dL — ABNORMAL HIGH (ref 70–99)
Glucose-Capillary: 79 mg/dL (ref 70–99)
Glucose-Capillary: 96 mg/dL (ref 70–99)
Glucose-Capillary: 107 mg/dL — ABNORMAL HIGH (ref 70–99)

## 2019-11-02 LAB — PROTIME-INR
INR: 1.3 — ABNORMAL HIGH (ref 0.8–1.2)
Prothrombin Time: 15.6 seconds — ABNORMAL HIGH (ref 11.4–15.2)

## 2019-11-02 LAB — ALBUMIN: Albumin: 1.8 g/dL — ABNORMAL LOW (ref 3.5–5.0)

## 2019-11-02 LAB — PHOSPHORUS: Phosphorus: 5 mg/dL — ABNORMAL HIGH (ref 2.5–4.6)

## 2019-11-02 SURGERY — INSERTION OF DIALYSIS CATHETER
Anesthesia: General | Site: Chest | Laterality: Right

## 2019-11-02 MED ORDER — LIDOCAINE 2% (20 MG/ML) 5 ML SYRINGE
INTRAMUSCULAR | Status: AC
  Filled 2019-11-02: qty 5

## 2019-11-02 MED ORDER — HEPARIN SODIUM (PORCINE) 1000 UNIT/ML IJ SOLN
INTRAMUSCULAR | Status: DC | PRN
  Administered 2019-11-02: 3400 [IU]

## 2019-11-02 MED ORDER — LIDOCAINE HCL (PF) 1 % IJ SOLN
INTRAMUSCULAR | Status: AC
  Filled 2019-11-02: qty 30

## 2019-11-02 MED ORDER — SODIUM CHLORIDE 0.9 % IV SOLN
INTRAVENOUS | Status: AC
  Filled 2019-11-02: qty 1.2

## 2019-11-02 MED ORDER — FENTANYL CITRATE (PF) 250 MCG/5ML IJ SOLN
INTRAMUSCULAR | Status: AC
  Filled 2019-11-02: qty 5

## 2019-11-02 MED ORDER — CHLORHEXIDINE GLUCONATE CLOTH 2 % EX PADS
6.0000 | MEDICATED_PAD | Freq: Every day | CUTANEOUS | Status: DC
  Administered 2019-11-03: 6 via TOPICAL

## 2019-11-02 MED ORDER — DEXAMETHASONE SODIUM PHOSPHATE 4 MG/ML IJ SOLN
INTRAMUSCULAR | Status: DC | PRN
  Administered 2019-11-02: 4 mg via INTRAVENOUS

## 2019-11-02 MED ORDER — ROCURONIUM BROMIDE 10 MG/ML (PF) SYRINGE
PREFILLED_SYRINGE | INTRAVENOUS | Status: AC
  Filled 2019-11-02: qty 10

## 2019-11-02 MED ORDER — PROMETHAZINE HCL 25 MG/ML IJ SOLN: 6.2500 mg | INTRAMUSCULAR | Status: DC | PRN

## 2019-11-02 MED ORDER — PHENYLEPHRINE HCL-NACL 10-0.9 MG/250ML-% IV SOLN
INTRAVENOUS | Status: DC | PRN
  Administered 2019-11-02: 80 ug/min via INTRAVENOUS
  Administered 2019-11-02: 40 ug/min via INTRAVENOUS

## 2019-11-02 MED ORDER — DEXAMETHASONE SODIUM PHOSPHATE 10 MG/ML IJ SOLN
INTRAMUSCULAR | Status: AC
  Filled 2019-11-02: qty 1

## 2019-11-02 MED ORDER — 0.9 % SODIUM CHLORIDE (POUR BTL) OPTIME
TOPICAL | Status: DC | PRN
  Administered 2019-11-02: 1000 mL

## 2019-11-02 MED ORDER — ONDANSETRON HCL 4 MG/2ML IJ SOLN
INTRAMUSCULAR | Status: AC
  Filled 2019-11-02: qty 2

## 2019-11-02 MED ORDER — MIDAZOLAM HCL 2 MG/2ML IJ SOLN
INTRAMUSCULAR | Status: AC
  Filled 2019-11-02: qty 2

## 2019-11-02 MED ORDER — LIDOCAINE 2% (20 MG/ML) 5 ML SYRINGE
INTRAMUSCULAR | Status: DC | PRN
  Administered 2019-11-02: 80 mg via INTRAVENOUS

## 2019-11-02 MED ORDER — PROPOFOL 10 MG/ML IV BOLUS
INTRAVENOUS | Status: DC | PRN
  Administered 2019-11-02: 150 mg via INTRAVENOUS

## 2019-11-02 MED ORDER — SODIUM CHLORIDE 0.9 % IV SOLN
1.0000 g | INTRAVENOUS | Status: DC
  Administered 2019-11-02: 1 g via INTRAVENOUS
  Filled 2019-11-02 (×2): qty 1

## 2019-11-02 MED ORDER — PHENYLEPHRINE 40 MCG/ML (10ML) SYRINGE FOR IV PUSH (FOR BLOOD PRESSURE SUPPORT)
PREFILLED_SYRINGE | INTRAVENOUS | Status: AC
  Filled 2019-11-02: qty 10

## 2019-11-02 MED ORDER — SODIUM CHLORIDE 0.9 % IV SOLN
INTRAVENOUS | Status: DC | PRN
  Administered 2019-11-02: 500 mL

## 2019-11-02 MED ORDER — HEPARIN SODIUM (PORCINE) 1000 UNIT/ML IJ SOLN
INTRAMUSCULAR | Status: AC
  Filled 2019-11-02: qty 1

## 2019-11-02 MED ORDER — FENTANYL CITRATE (PF) 100 MCG/2ML IJ SOLN
INTRAMUSCULAR | Status: DC | PRN
  Administered 2019-11-02 (×2): 50 ug via INTRAVENOUS
  Administered 2019-11-02: 25 ug via INTRAVENOUS
  Administered 2019-11-02 (×2): 50 ug via INTRAVENOUS

## 2019-11-02 MED ORDER — CEFAZOLIN SODIUM-DEXTROSE 1-4 GM/50ML-% IV SOLN
INTRAVENOUS | Status: DC | PRN
  Administered 2019-11-02: 2 g via INTRAVENOUS

## 2019-11-02 MED ORDER — PHENYLEPHRINE 40 MCG/ML (10ML) SYRINGE FOR IV PUSH (FOR BLOOD PRESSURE SUPPORT)
PREFILLED_SYRINGE | INTRAVENOUS | Status: DC | PRN
  Administered 2019-11-02 (×2): 160 ug via INTRAVENOUS
  Administered 2019-11-02 (×2): 120 ug via INTRAVENOUS

## 2019-11-02 MED ORDER — IODIXANOL 320 MG/ML IV SOLN
INTRAVENOUS | Status: DC | PRN
  Administered 2019-11-02: 10 mL via INTRA_ARTERIAL

## 2019-11-02 MED ORDER — SODIUM CHLORIDE 0.9 % IV SOLN: INTRAVENOUS | Status: DC

## 2019-11-02 MED ORDER — PROPOFOL 10 MG/ML IV BOLUS
INTRAVENOUS | Status: AC
  Filled 2019-11-02: qty 20

## 2019-11-02 MED ORDER — MIDAZOLAM HCL 5 MG/5ML IJ SOLN
INTRAMUSCULAR | Status: DC | PRN
  Administered 2019-11-02: 2 mg via INTRAVENOUS

## 2019-11-02 MED ORDER — PROTAMINE SULFATE 10 MG/ML IV SOLN
INTRAVENOUS | Status: DC | PRN
  Administered 2019-11-02: 60 mg via INTRAVENOUS

## 2019-11-02 MED ORDER — HEMOSTATIC AGENTS (NO CHARGE) OPTIME
TOPICAL | Status: DC | PRN
  Administered 2019-11-02: 1 via TOPICAL

## 2019-11-02 MED ORDER — HEPARIN SODIUM (PORCINE) 1000 UNIT/ML IJ SOLN
INTRAMUSCULAR | Status: DC | PRN
  Administered 2019-11-02: 6000 [IU] via INTRAVENOUS

## 2019-11-02 MED ORDER — FENTANYL CITRATE (PF) 100 MCG/2ML IJ SOLN: 25.0000 ug | INTRAMUSCULAR | Status: DC | PRN

## 2019-11-02 SURGICAL SUPPLY — 63 items
ARMBAND PINK RESTRICT EXTREMIT (MISCELLANEOUS) ×7 IMPLANT
BAG DECANTER FOR FLEXI CONT (MISCELLANEOUS) ×13 IMPLANT
BIOPATCH RED 1 DISK 7.0 (GAUZE/BANDAGES/DRESSINGS) ×4 IMPLANT
CANISTER SUCT 3000ML PPV (MISCELLANEOUS) ×4 IMPLANT
CANNULA VESSEL 3MM 2 BLNT TIP (CANNULA) ×4 IMPLANT
CATH PALINDROME RT-P 15FX19CM (CATHETERS) IMPLANT
CATH PALINDROME RT-P 15FX23CM (CATHETERS) ×1 IMPLANT
CATH PALINDROME RT-P 15FX28CM (CATHETERS) IMPLANT
CATH PALINDROME RT-P 15FX55CM (CATHETERS) IMPLANT
CATH STRAIGHT 5FR 65CM (CATHETERS) IMPLANT
CHLORAPREP W/TINT 26 (MISCELLANEOUS) ×4 IMPLANT
CLIP VESOCCLUDE MED 6/CT (CLIP) ×4 IMPLANT
CLIP VESOCCLUDE SM WIDE 6/CT (CLIP) ×4 IMPLANT
COVER PROBE W GEL 5X96 (DRAPES) IMPLANT
COVER SURGICAL LIGHT HANDLE (MISCELLANEOUS) ×4 IMPLANT
COVER WAND RF STERILE (DRAPES) ×3 IMPLANT
DECANTER SPIKE VIAL GLASS SM (MISCELLANEOUS) ×3 IMPLANT
DERMABOND ADVANCED (GAUZE/BANDAGES/DRESSINGS) ×2
DERMABOND ADVANCED .7 DNX12 (GAUZE/BANDAGES/DRESSINGS) ×3 IMPLANT
DEVICE TORQUE KENDALL .025-038 (MISCELLANEOUS) ×1 IMPLANT
DRAIN PENROSE 1/4X12 LTX STRL (WOUND CARE) ×4 IMPLANT
DRAPE C-ARM 42X72 X-RAY (DRAPES) ×4 IMPLANT
DRAPE CHEST BREAST 15X10 FENES (DRAPES) ×4 IMPLANT
DRSG COVADERM 4X6 (GAUZE/BANDAGES/DRESSINGS) ×1 IMPLANT
ELECT REM PT RETURN 9FT ADLT (ELECTROSURGICAL) ×4
ELECTRODE REM PT RTRN 9FT ADLT (ELECTROSURGICAL) ×3 IMPLANT
GAUZE 4X4 16PLY RFD (DISPOSABLE) ×5 IMPLANT
GLOVE BIO SURGEON STRL SZ7.5 (GLOVE) ×5 IMPLANT
GLOVE BIOGEL PI IND STRL 6.5 (GLOVE) IMPLANT
GLOVE BIOGEL PI INDICATOR 6.5 (GLOVE) ×3
GLOVE ECLIPSE 6.5 STRL STRAW (GLOVE) ×1 IMPLANT
GOWN STRL REUS W/ TWL LRG LVL3 (GOWN DISPOSABLE) ×9 IMPLANT
GOWN STRL REUS W/TWL LRG LVL3 (GOWN DISPOSABLE) ×20
GUIDEWIRE ANGLED .035X150CM (WIRE) ×1 IMPLANT
KIT BASIN OR (CUSTOM PROCEDURE TRAY) ×4 IMPLANT
KIT TURNOVER KIT B (KITS) ×4 IMPLANT
LOOP VESSEL MINI RED (MISCELLANEOUS) IMPLANT
NDL 18GX1X1/2 (RX/OR ONLY) (NEEDLE) ×3 IMPLANT
NDL HYPO 25GX1X1/2 BEV (NEEDLE) ×3 IMPLANT
NEEDLE 18GX1X1/2 (RX/OR ONLY) (NEEDLE) ×4 IMPLANT
NEEDLE HYPO 25GX1X1/2 BEV (NEEDLE) ×4 IMPLANT
NS IRRIG 1000ML POUR BTL (IV SOLUTION) ×4 IMPLANT
PACK CV ACCESS (CUSTOM PROCEDURE TRAY) ×4 IMPLANT
PACK SURGICAL SETUP 50X90 (CUSTOM PROCEDURE TRAY) ×4 IMPLANT
PAD ARMBOARD 7.5X6 YLW CONV (MISCELLANEOUS) ×8 IMPLANT
SET MICROPUNCTURE 5F STIFF (MISCELLANEOUS) ×1 IMPLANT
SPONGE SURGIFOAM ABS GEL 100 (HEMOSTASIS) IMPLANT
SUT ETHILON 3 0 PS 1 (SUTURE) ×4 IMPLANT
SUT PROLENE 6 0 CC (SUTURE) ×6 IMPLANT
SUT PROLENE 7 0 BV 1 (SUTURE) ×1 IMPLANT
SUT VIC AB 3-0 SH 27 (SUTURE) ×8
SUT VIC AB 3-0 SH 27X BRD (SUTURE) ×3 IMPLANT
SUT VIC AB 4-0 PS2 18 (SUTURE) ×2 IMPLANT
SUT VICRYL 4-0 PS2 18IN ABS (SUTURE) ×4 IMPLANT
SYR 10ML LL (SYRINGE) ×4 IMPLANT
SYR 20ML LL LF (SYRINGE) ×8 IMPLANT
SYR 5ML LL (SYRINGE) ×4 IMPLANT
SYR CONTROL 10ML LL (SYRINGE) ×3 IMPLANT
TOWEL GREEN STERILE (TOWEL DISPOSABLE) ×8 IMPLANT
TOWEL GREEN STERILE FF (TOWEL DISPOSABLE) ×4 IMPLANT
UNDERPAD 30X30 (UNDERPADS AND DIAPERS) ×4 IMPLANT
WATER STERILE IRR 1000ML POUR (IV SOLUTION) ×4 IMPLANT
WIRE AMPLATZ SS-J .035X180CM (WIRE) IMPLANT

## 2019-11-02 NOTE — Progress Notes (Signed)
Pharmacy Antibiotic Note  James [name] is a 49 y.o. male admitted on 10/28/2019 with SBP.  Cefepime and Vancomycin has been discontinued. Pharmacy consulted to change to Ceftazidime dosing. Patient is also Cdiff positive on po Vancomycin.   Last Ceftazidime dose was given IV on 4/7. None since.  Patient is ESRD on peritoneal dialysis but wants to stop and transition to hemodialysis. Current plan is to do this today after tunneled dialysis catheter and R-arm AV fistula/graft placement today by vascular surgery.  Sensitivities remain pending at this time.  WBC is low at 1.9. Patient is afebrile.   Plan: -Ceftazidime 1g IV every 24 hours for now as has not received GN coverage since 4/9 and then will follow-up plan for CCPD versus HD to schedule further doses.  -Monitor renal plans, culture results, and clinical status.   Height: 5\' 6"  (167.6 cm) Weight: 69.5 kg (153 lb 3.5 oz) IBW/kg (Calculated) : 63.8  Temp (24hrs), Avg:98.6 F (37 C), Min:98.3 F (36.8 C), Max:98.8 F (37.1 C)  Recent Labs  Lab 10/28/19 2024 10/28/19 2024 10/28/19 2042 10/29/19 0237 10/29/19 0436 10/30/19 0536 10/31/19 0810 10/31/19 0940 11/01/19 0533 11/02/19 0508  WBC 1.5*   < >  --  1.8*  --  2.1* 1.7*  --  1.5* 1.9*  CREATININE 12.94*  --   --   --   --  13.29* 12.73*  --  11.49* 11.96*  LATICACIDVEN  --   --  1.5  --  1.7  --   --   --   --   --   VANCORANDOM  --   --   --   --   --   --   --  21  --   --    < > = values in this interval not displayed.    Estimated Creatinine Clearance: 7 mL/min (A) (by C-G formula based on SCr of 11.96 mg/dL (H)).    Allergies  Allergen Reactions  . Tramadol Hcl Shortness Of Breath and Other (See Comments)    Tramadol HCl = Shortness of breath and Tramadol = Rash  . Nsaids Other (See Comments)    Was told by MD to not take these  . Darvon [Propoxyphene] Rash and Other (See Comments)    Broke out on eyelid   . Tramadol Rash and Other (See Comments)   Tramadol = Rash and Tramadol HCl = Shortness of breath    Ceftaz 4/7 x 1; 4/12 >> Rocephin 4/8 x 1 Cefepime 4/8>> 4/10 Vanc IV 4/7 >>4/12 Vanc po 4/10 >> Valcyte (for EBV)>>  4/7: Peritoneal fluid Cx: (negative gram stain)>> Pseudomonas fluorescens 4/9 CDiff >> antigen +, toxin -, PCR +   Thank you for allowing pharmacy to be a part of this patient's care.  Sloan Leiter, PharmD, BCPS, BCCCP Clinical Pharmacist Clinical phone 11/02/2019 until 3:30 [phone] Please refer to [facility] for [place] numbers 11/02/2019 8:11 AM   **Pharmacist phone directory can now be found on [email or web address] ([email or web address]).  Listed under [place].

## 2019-11-02 NOTE — Interval H&P Note (Signed)
History and Physical Interval Note:  11/02/2019 10:35 AM  [place]  has presented today for surgery, with the diagnosis of ESRD.  The various methods of treatment have been discussed with the patient and family. After consideration of risks, benefits and other options for treatment, the patient has consented to  Procedure(s): INSERTION OF TUNNELED DIALYSIS CATHETER (N/A) ARTERIOVENOUS (AV) FISTULA CREATION VS. GRAFT RIGHT ARM (Right) as a surgical intervention.  The patient's history has been reviewed, patient examined, no change in status, stable for surgery.  I have reviewed the patient's chart and labs.  Questions were answered to the patient's satisfaction.     Ruta Hinds

## 2019-11-02 NOTE — Progress Notes (Addendum)
James [name] Progress Note   Subjective: Awake, says he still has abdominal pain, some nausea/vomiting but feeling better. Afebrile overnight. Denies SOB. No excess volume.    Objective Vitals:   11/01/19 1643 11/01/19 1845 11/01/19 2051 11/02/19 0621  BP: (!) 156/92 (!) 143/84 (!) 168/100 (!) 179/97  Pulse: 85 92 97 98  Resp: 18 18 16 18   Temp: 98.8 F (37.1 C) 98.3 F (36.8 C) 98.6 F (37 C) 98.7 F (37.1 C)  TempSrc: Oral Axillary Oral Oral  SpO2: 98% 99% 98% 94%  Weight:  69.8 kg 69.5 kg   Height:       Physical Exam General: Pleasant chronically ill appearing male in NAD Heart:S1,S2 RRR No M/R/G Lungs: CTAB Abdomen: Guarding, still tender LUA. PD catheter drsg CDI. 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    Additional Objective Labs: Basic Metabolic Panel: Recent Labs  Lab 10/30/19 0536 10/30/19 0536 10/31/19 0810 11/01/19 0533 11/02/19 0508  NA 135   < > 131* 133* 135  K 4.0   < > 3.6 3.2* 3.3*  CL 94*   < > 94* 93* 93*  CO2 26   < > 24 26 28   GLUCOSE 82   < > 89 136* 118*  BUN 38*   < > 35* 30* 28*  CREATININE 13.29*   < > 12.73* 11.49* 11.96*  CALCIUM 8.5*   < > 7.9* 8.3* 8.4*  PHOS 5.3*  --   --   --   --    < > = values in this interval not displayed.   Liver Function Tests: Recent Labs  Lab 10/28/19 1024  AST 24  ALT 20  ALKPHOS 89  BILITOT 0.5  PROT 7.1  ALBUMIN 2.5*   Recent Labs  Lab 10/28/19 1024  LIPASE 24   CBC: Recent Labs  Lab 10/29/19 0237 10/29/19 0436 10/30/19 0536 10/30/19 0536 10/31/19 0810 10/31/19 0810 10/31/19 1839 11/01/19 0533 11/02/19 0508  WBC 1.8*   < > 2.1*   < > 1.7*  --   --  1.5* 1.9*  NEUTROABS  --   --  1.1*   < > 0.7*  --   --  0.6* 0.9*  HGB 4.6*   < > 7.5*   < > 6.7*   < > 8.3* 8.6* 9.2*  HCT 14.2*   < > 21.8*  20.9*   < > 19.4*   < > 23.6* 24.7* 27.1*  MCV 110.1*  --  101.9*  --  100.5*  --   --  97.2 99.6  PLT  105*   < > 103*   < > 92*  --   --  85* 91*   < > = values in this interval not displayed.   Blood Culture    Component Value Date/Time   SDES BLOOD RIGHT FINGER 10/29/2019 0047   SPECREQUEST  10/29/2019 0047    BOTTLES DRAWN AEROBIC ONLY Blood Culture adequate volume   CULT  10/29/2019 0047    NO GROWTH 4 DAYS Performed at [facility], [place] [address] [place], [place] [address]    REPTSTATUS PENDING 10/29/2019 0047    Cardiac Enzymes: No results for input(s): CKTOTAL, CKMB, CKMBINDEX, TROPONINI in the last 168 hours. CBG: Recent Labs  Lab 10/31/19 2228 11/01/19 0645 11/01/19 1127 11/01/19 2049 11/02/19 0652  GLUCAP 197* 108* 89 151* 120*   Iron Studies: No results for input(s):  IRON, TIBC, TRANSFERRIN, FERRITIN in the last 72 hours. @lablastinr3 @ Studies/Results: VAS [place] UPPER EXT VEIN MAPPING (PRE-OP AVF)  Result Date: 11/01/2019 UPPER EXTREMITY VEIN MAPPING  Indications: Pre-dialysis access. History: ESRD on peritoneal dialysis. Previous left upper arm fistula that was          usable and right upper extremity fistula that was never usable. History          of renal transplant 2009.  Limitations: IV, body habitus Comparison Study: No prior study on file Performing Technologist: Sharion Dove RVS  Examination Guidelines: A complete evaluation includes B-mode imaging, spectral Doppler, color Doppler, and power Doppler as needed of all accessible portions of each vessel. Bilateral testing is considered an integral part of a complete examination. Limited examinations for reoccurring indications may be performed as noted. +-----------------+-------------+----------+--------------+ Right Cephalic   Diameter (cm)Depth (cm)   Findings    +-----------------+-------------+----------+--------------+ Prox upper arm       0.36        0.33                  +-----------------+-------------+----------+--------------+ Mid upper arm        0.30        0.47                   +-----------------+-------------+----------+--------------+ Dist upper arm       0.29        0.27     branching    +-----------------+-------------+----------+--------------+ Antecubital fossa    0.19        0.30                  +-----------------+-------------+----------+--------------+ Prox forearm         0.17        0.39                  +-----------------+-------------+----------+--------------+ Mid forearm          0.15        0.37     branching    +-----------------+-------------+----------+--------------+ Wrist                                   not visualized +-----------------+-------------+----------+--------------+ +-----------------+-------------+----------+--------------+ Right Basilic    Diameter (cm)Depth (cm)   Findings    +-----------------+-------------+----------+--------------+ Prox upper arm       0.55        0.65       origin     +-----------------+-------------+----------+--------------+ Mid upper arm        0.46        0.60                  +-----------------+-------------+----------+--------------+ Dist upper arm       0.46        0.48     branching    +-----------------+-------------+----------+--------------+ Antecubital fossa    0.37        0.54                  +-----------------+-------------+----------+--------------+ Prox forearm         0.22        0.23     branching    +-----------------+-------------+----------+--------------+ Mid forearm          0.22        0.24                  +-----------------+-------------+----------+--------------+ Wrist  not visualized +-----------------+-------------+----------+--------------+ +-----------------+-------------+----------+--------------+ Left Cephalic    Diameter (cm)Depth (cm)   Findings    +-----------------+-------------+----------+--------------+ Mid upper arm                           not visualized  +-----------------+-------------+----------+--------------+ Dist upper arm                          not visualized +-----------------+-------------+----------+--------------+ Antecubital fossa    0.45        0.22     branching    +-----------------+-------------+----------+--------------+ Prox forearm         0.26        0.35     branching    +-----------------+-------------+----------+--------------+ Mid forearm          0.20        0.64                  +-----------------+-------------+----------+--------------+ Wrist                                   not visualized +-----------------+-------------+----------+--------------+ +-----------------+-------------+----------+--------+ Left Basilic     Diameter (cm)Depth (cm)Findings +-----------------+-------------+----------+--------+ Mid upper arm       735.00       1.19    origin  +-----------------+-------------+----------+--------+ Dist upper arm       0.68        0.75            +-----------------+-------------+----------+--------+ Antecubital fossa    0.24        0.63            +-----------------+-------------+----------+--------+ Prox forearm         0.30        0.27            +-----------------+-------------+----------+--------+ Mid forearm          0.23        0.47            +-----------------+-------------+----------+--------+ Wrist                0.27        0.31            +-----------------+-------------+----------+--------+ *See table(s) above for measurements and observations.  Diagnosing physician: Servando Snare MD Electronically signed by Servando Snare MD on 11/01/2019 at 1:40:45 PM.    Final    Medications: . cefTAZidime (FORTAZ)  IV     . sodium chloride   Intravenous Once  . amLODipine  5 mg Oral Daily  . calcitRIOL  0.75 mcg Oral Daily  . darbepoetin (ARANESP) injection - NON-DIALYSIS  150 mcg Subcutaneous Q Thu-1800  . doxazosin  4 mg Oral QHS  . ferrous sulfate  325 mg Oral BID  WC  . folic acid  1 mg Oral Daily  . gentamicin cream  1 application Topical Daily  . insulin aspart  0-5 Units Subcutaneous QHS  . insulin aspart  0-6 Units Subcutaneous TID WC  . lisinopril  20 mg Oral Daily  . metoprolol tartrate  100 mg Oral BID  . valGANciclovir  450 mg Oral Q M,W,F  . vancomycin  125 mg Oral TID AC & HS  . Warfarin - Pharmacist Dosing Inpatient   Does not apply q1600   Background: Former [facility] patient (Dr. Mattingly/Dr. Jimmy Footman) with recurrent peritonitis now transitioning to HD after  TDC/Perm access placed. Access placement planned for today. Wants to transfer from Dr. Joesph July back to [place]. Will need CLIP started.   Dialysis Orders: [facility] 9H 5 cycles 2.3L Last fill midday 1.5L  Assessment/Plan:  1.Abdominal pain/Likely SBP: CT unrevealing.PD fluid with 4100 TNCs> 849 (4/07). PD fluid cx few pseudomonas fluroescens 04/11/2021Per [facility] consult . Prior peritonitis 03/2019 was staph hemolyticus.Started ceftazidime today. Cefepime and Vanc DC'd.  2. [facility] CCPD. H/O recurrent peritonitis. Says he has had it "enough". No compelling HD needs for today. Will start T,Th, S schedule after access placed by [facility] today. He wishes to return to [facility] and hoping [facility] get [facility] which is [facility] to his home. CLIP coordinator notified. No issues with PD overnight. Doesn't know color of fluid or whether is PD drainage cloudy or clear.  3. HTN/volume-No evidence of volume overload by exam. BP still elevated, continue Home Meds. (Lisinopril 10 mg q day, Metoprolol 100 mg PO bid, started amlodipine 5mg  PO q HS.) Minimal UF with 1st HD tomorrow.  4. ABLA/Anemia CKD -HGB 9.2 today. HGB 6.7 on adm. S/P 2 units PRBCS. FOBT+ HO C Diff .Last ESA 3/16 at outpatient center. Resume ESA here.Aranesp 174mcg subcut q tues ( given 4/08 ) 5.MBD of CKD -Continue home binders/calcitriol when eating. RFP added to today's labs . 6. HO C Diff = po vanc per  primary 6.Nutrition.last Alb 2.5 ,refused Nepro,Prot supp  7. H/oB cell lymphoma  With Pancytopenia - in Remission /He follows with hematology/oncology Dr. Marcelene Butte.  8. 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 Has been resumed, pharmacy managing.   Rita H. Brown NP-C 11/02/2019, 9:39 AM  [employer] [phone]  I have seen and examined this patient and agree with plan and assessment in the above note with renal recommendations/intervention highlighted.  Agree with transition to incenter HD due to recurrent episodes of peritonitis.  Awaiting placement for outpatient HD.  Broadus John A Melisa Donofrio,MD 11/02/2019 3:12 PM

## 2019-11-02 NOTE — Anesthesia Procedure Notes (Signed)
Procedure Name: LMA Insertion Date/Time: 11/02/2019 11:04 AM Performed by: Jaiyon Wander T, CRNA Pre-anesthesia Checklist: Patient identified, Emergency Drugs available, Suction available and Patient being monitored Patient Re-evaluated:Patient Re-evaluated prior to induction Oxygen Delivery Method: Circle system utilized Preoxygenation: Pre-oxygenation with 100% oxygen Induction Type: IV induction Ventilation: Mask ventilation without difficulty LMA: LMA inserted LMA Size: 5.0 Number of attempts: 1 Placement Confirmation: breath sounds checked- equal and bilateral and positive ETCO2 Tube secured with: Tape Dental Injury: Teeth and Oropharynx as per pre-operative assessment

## 2019-11-02 NOTE — Transfer of Care (Signed)
Immediate Anesthesia Transfer of Care Note  Patient: James [name]  Procedure(s) Performed: INSERTION OF TUNNELED DIALYSIS CATHETER LEFT INTERNAL JUGULAR (Left ) ARTERIOVENOUS (AV) FISTULA CREATION RIGHT UPPER ARM (Right ) Central Venogram (N/A Chest) Right Cephalic Vein Transposition (Right )  Patient Location: PACU  Anesthesia Type:General  Level of Consciousness: drowsy  Airway & Oxygen Therapy: Patient Spontanous Breathing and Patient connected to nasal cannula oxygen  Post-op Assessment: Report given to RN, Post -op Vital signs reviewed and stable and Patient moving all extremities  Post vital signs: Reviewed and stable  Last Vitals:  Vitals Value Taken Time  BP    Temp    Pulse    Resp    SpO2      Last Pain:  Vitals:   11/02/19 0820  TempSrc: Oral  PainSc: 0-No pain      Patients Stated Pain Goal: 0 (41/03/01 3143)  Complications: No apparent anesthesia complications

## 2019-11-02 NOTE — Op Note (Signed)
Procedure:  1.  Insertion of left internal jugular vein palindrome catheter  2.  Central venogram  3.  Right cephalic vein transposition fistula  4.  Ultrasound right upper arm  Preoperative diagnosis: End-stage renal disease  Postoperative diagnosis: Same  Anesthesia: General  Assistant: Risa Grill, PA-C  Operative findings:   1. 2 mm right cephalic vein  2.  Narrowing of left internal jugular vein innominate junction  3.  Small right cephalic vein  Operative details: After informed consent, pacing the operating.  The patient placed supine position on the operating table.  After induction of general anesthesia, laryngeal mask was placed.  Next patient's entire neck and chest were prepped and draped in usual sterile fashion.  Ultrasound was used to identify the right and left internal jugular veins.  The right internal jugular vein was quite small the left internal jugular vein was of large caliber.  Therefore an introducer needle was used to cannulate the left internal jugular vein and an 035 J-tip guidewire advanced into the central venous circulation fluoroscopic guidance.  J-wire would not advance all the way into the right atrium.  I tried the back end of the wire as well as the front end of the J portion of the wire neither of these were passed.  Therefore a micropuncture sheath was placed over the guidewire and a contrast venogram was obtained of the central venous circulation.  This showed that the left innominate vein was patent but there was about a 75% stenosis of the junction of the left internal jugular vein junction with the innominate vein.  I was able to advance a micropuncture wire across this and then advance the micropuncture sheath in the innominate vein.  I was then able to advance the 035 J-wire all the way down into the inferior vena cava fluoroscopic guidance.  Neck sequential 07/06/2015 [place] dilators with peel-away sheath placed over the guidewire into the right  atrium.  Catheter was tunneled subcutaneously cut the length and the hub attached.  It was noted to flush and draw easily.  Catheter was sutured the skin with nylon sutures.  Neck insertion site was closed with a Vicryl stitch.  Catheter was loaded concentrated heparin solution.  Attention was then turned to the patient's right upper extremity.  Preoperative vein mapping had suggested that the right basilic vein may be of reasonable quality for fistula.  I performed an ultrasound of the right basilic and cephalic veins.  These were both fairly small.  About 2-1/2 mm in diameter.  Since the cephalic vein was essentially the same diameter as the basilic vein and I decided to use this is conduit for the fistula.  Longitudinal incision was made over the anterolateral aspect of the right upper arm and into the antecubital region to expose the cephalic vein.  It was about 2 mm in diameter.  It was of reasonable quality.  About 5 cm was dissected free circumferentially and small side branches ligated divided tween silk ties.  Next in the medial portion of the arm the brachial artery was exposed through a longitudinal incision.  Brachial artery was about 3 mm in diameter.  Had a good pulse within it.  It was dissected free circumferentially and Vesseloops placed around it.  Subcutaneous tunnel was then used to connect the medial to the lateral incision the distal cephalic vein was ligated with a 2-0 silk tie and swung under the subcutaneous tunnel over to the right brachial artery.  Patient was given 6000 units  of intravenous heparin.  The artery was controlled proximally distally with Vesseloops.  A longitudinal opening was made in the artery and the vein slightly spatulated.  It was then sewn end of vein to side of artery using a running 6-0 Prolene suture.  Despite completion anastomosis it was for blood backbled and thoroughly flushed anastomosis was secured clamps released there was some bleeding in 2 areas which were  repaired with single 7-0 Prolene sutures.  Hemoblast was applied.  Patient was also given 60 mg of protamine.  Hemostasis was obtained.  Both incisions were then closed with running 3-0 Vicryl subcutaneous layer and a 4-0 Vicryl subcuticular stitch followed by Dermabond.  There was good Doppler flow and a weakly palpable thrill at the proximal aspect of the fistula into the case.  Patient tolerated procedure well and there were no complications.  Initial sponge and needle counts correct in the case.  Patient was taken to recovery in stable condition.  Ruta Hinds, MD [facility] Office: [phone]

## 2019-11-02 NOTE — Progress Notes (Signed)
PROGRESS NOTE    James [name] [facility]  DQQ:[id number] DOB: Jun 12, 1971 DOA: 10/28/2019 PCP: [name], MD   Brief Narrative:  Patient is a 49 year old male with history of end-stage renal disease status post renal transplant in 2008  , with failure in 2018 now on peritoneal dialysis, diabetes type 2, hypertension, hyperlipidemia who presented to the emergency department with 2 days complaints of abdomen pain, nausea, vomiting, diarrhea.  Also reported fever, chills.  Patient admitted for the management of possible spontaneous bacterial peritonitis.  Underwent paracentesis with confirmatory diagnosis of SBP.  Peritoneal fluid culture showing Pseudomonas fluorescence so started on ceftazidime.   Nephrology consulted and following. Plan is to stop PD and start on hemodialysis. C. difficile came out to be positive, started on oral vancomycin. Underwent  tunneled dialysis catheter and right arm AV fistula/graft today by vascular surgery.  Assessment & Plan:   Principal Problem:   Spontaneous bacterial peritonitis ([place]) Active Problems:   ESRD on peritoneal dialysis ([place])   Diabetes mellitus without complication ([place])   Hypertension   Renal transplant recipient   SBP: Underwent paracentesis with findings of SBP.  He has been febrile, tachycardic on presentation. Peritoneal fluid culture,blood culture NGTD.  Gram stain did not show any organisms.  He had history of staphylococcal hemolyticus peritonitis back in 03/2019 and was treated with vancomycin. Peritoneal fluid culture now showing Pseudomonas fluorescence so started on ceftazidime.  We will follow susceptibility.  Abdominal pain/C diff/diarrhea: Presented with abdominal pain,diarrhoea CT abdomen/pelvis did not show any signs of bowel inflammation.Found to have Newly enlarged right external iliac lymph node, nonspecific. C. difficile antigen is positive, toxin is negative but patient was having diarrhea/abdominal pain so we will treat with oral  vancomycin,10 days course.  ESRD on peritoneal dialysis: Nephrology following.  He has a peritoneal  dialysis catheter.He has history of renal transplant which ultimately failed.  He follows with Dr. Percell Belt at [facility].  He takes prednisone, Prograf at home which are on hold due to possible underlying sepsis from SBP. Patient wants to stop peritoneal dialysis and go for hemodialysis now. Vascular surgery placed  temporary dialysis catheter , AV fistula on the right arm on 11/02/2019.  He will need outpatient dialysis center before discharge.  Diabetes type 2: Continue sliding scale insulin.    He is on insulin at home.  He had episodes of hypoglycemia while hospitalized.  Hypertension: He has been hypertensive.  Continue his home medications.  Added amlodipine.  History of peripheral vascular disease: Status post recent right BKA.  Continue current meds  Pancytopenia: Chronic.  On looking at his previous blood works, he has chronic pancytopenia but it has worsened. S/p 1 unit of transfusion of  PRBC on this admission.  Normal iron, high ferritin, high vitamin B12.  Also has thrombocytopenia and leukopenia. Positive stool occult blood.  It could be associated with his colitis.  His hemoglobin has been  running in the range of 7 since last year, most likely this is associated with his ESRD.  He does not have any active bleeding at present.  He is also not a candidate for any kind of procedure due to possible underlying sepsis,C diff.    I will defer GI consultation. He has history of B-cell lymphoma, currently in remission.  He follows with hematology/oncology Dr. Cassell Clement, [facility].  I will recommend to follow-up with her as an outpatient in 2 weeks after discharge.  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 Will restart. Monitor INR  Hypokalemia: Supplemented with potassium.  Generalized weakness, debility, deconditioning: PT recommended home health.          DVT prophylaxis:Coumadin Code Status: Full Family Communication: None present at the bedside.  Patient states he does not have any family members. Status is: Inpatient  Remains inpatient appropriate because:Ongoing diagnostic testing needed not appropriate for outpatient work up. Waiting for peritoneal fluid culture susceptibility, needs nephrology clearance before discharge, needs outpatient hemodialysis center.   Dispo: The patient is from: Home              Anticipated d/c is to: Home              Anticipated d/c date is: 2 days              Patient currently is not medically stable to d/c.        Consultants: Nephrology, vascular surgery  Procedures: Paracentesis, PermCath placement, AV fistula placement  Antimicrobials:  Anti-infectives (From admission, onward)   Start     Dose/Rate Route Frequency Ordered Stop   11/01/19 1800  vancomycin (VANCOCIN) 125 mg, heparin 2,500 Units in dialysis solution 1.5% low-MG/low-CA 5,000 mL dialysis solution  Status:  Discontinued      Peritoneal Dialysis Daily-1800 11/01/19 0834 11/01/19 0840   11/01/19 1800  vancomycin (VANCOCIN) 125 mg, heparin 2,500 Units in dialysis solution 2.5% low-MG/low-CA 5,000 mL dialysis solution  Status:  Discontinued      Peritoneal Dialysis Daily-1800 11/01/19 0840 11/02/19 0735   11/01/19 1800  vancomycin (VANCOCIN) 125 mg, heparin 2,500 Units in dialysis solution 1.5% low-MG/low-CA 5,000 mL dialysis solution  Status:  Discontinued      Peritoneal Dialysis Daily-1800 11/01/19 1304 11/02/19 0735   10/31/19 0830  vancomycin (VANCOCIN) 50 mg/mL oral solution 125 mg     125 mg Oral 3 times daily before meals & bedtime 10/31/19 0756     10/30/19 1000  valGANciclovir (VALCYTE) 450 MG tablet TABS 450 mg    Note to Pharmacy: Take one tablet (450 mg) by mouth on Monday, Wednesday, Friday before dialysis     450 mg Oral Every M-W-F 10/29/19 1332     10/29/19 1500  ceFEPIme (MAXIPIME) 1 g in sodium chloride  0.9 % 100 mL IVPB  Status:  Discontinued     1 g 200 mL/hr over 30 Minutes Intravenous Every 24 hours 10/29/19 1350 10/31/19 0759   10/29/19 0900  cefTRIAXone (ROCEPHIN) 2 g in sodium chloride 0.9 % 100 mL IVPB  Status:  Discontinued     2 g 200 mL/hr over 30 Minutes Intravenous Every 24 hours 10/29/19 0809 10/29/19 1332   10/28/19 1945  vancomycin (VANCOREADY) IVPB 1500 mg/300 mL     1,500 mg 150 mL/hr over 120 Minutes Intravenous  Once 10/28/19 1944 10/29/19 0132   10/28/19 1930  vancomycin (VANCOCIN) 1,500 mg in sodium chloride 0.9 % 500 mL IVPB  Status:  Discontinued     1,500 mg 250 mL/hr over 120 Minutes Intravenous  Once 10/28/19 1922 10/28/19 1943   10/28/19 1930  cefTAZidime (FORTAZ) 1 g in sodium chloride 0.9 % 100 mL IVPB  Status:  Discontinued     1 g 200 mL/hr over 30 Minutes Intravenous Every 24 hours 10/28/19 1922 10/29/19 0321   10/28/19 1922  vancomycin variable dose per unstable renal function (pharmacist dosing)  Status:  Discontinued      Does not apply See admin instructions 10/28/19 1922 11/01/19 8119  Subjective:  Patient seen and examined at the bedside this afternoon.  He just came back after tunneled catheter/AV fistula procedure.  Lying in bed .  Little sleepy.  He says his diarrhea has been improving and is making soft stools.  Denies any abdominal pain.  Objective: Vitals:   11/01/19 1643 11/01/19 1845 11/01/19 2051 11/02/19 0621  BP: (!) 156/92 (!) 143/84 (!) 168/100 (!) 179/97  Pulse: 85 92 97 98  Resp: 18 18 16 18   Temp: 98.8 F (37.1 C) 98.3 F (36.8 C) 98.6 F (37 C) 98.7 F (37.1 C)  TempSrc: Oral Axillary Oral Oral  SpO2: 98% 99% 98% 94%  Weight:  69.8 kg 69.5 kg   Height:        Intake/Output Summary (Last 24 hours) at 11/02/2019 0736 Last data filed at 11/02/2019 0600 Gross per 24 hour  Intake 720 ml  Output 0 ml  Net 720 ml   Filed Weights   11/01/19 0700 11/01/19 1845 11/01/19 2051  Weight: 71.2 kg 69.8 kg 69.5 kg     Examination:   General exam: Not in apparent distress, sleepy Respiratory system: Bilateral equal air entry, normal vesicular breath sounds, no wheezes or crackles  Cardiovascular system: S1 & S2 heard, RRR. No JVD, murmurs, rubs, gallops or clicks. Gastrointestinal system: Abdomen is nondistended, soft and nontender. No organomegaly or masses felt. Normal bowel sounds heard.  Peritoneal dialysis catheter Central nervous system: Alert and oriented. No focal neurological deficits. Extremities: No edema, no clubbing ,no cyanosis, right BKA  skin: No rashes, lesions or ulcers,no icterus ,no pallor    Data Reviewed: I have personally reviewed following labs and imaging studies  CBC: Recent Labs  Lab 10/29/19 0237 10/29/19 0436 10/30/19 0536 10/31/19 0810 10/31/19 1839 11/01/19 0533 11/02/19 0508  WBC 1.8*  --  2.1* 1.7*  --  1.5* 1.9*  NEUTROABS  --   --  1.1* 0.7*  --  0.6* 0.9*  HGB 4.6*   < > 7.5* 6.7* 8.3* 8.6* 9.2*  HCT 14.2*   < > 21.8*  20.9* 19.4* 23.6* 24.7* 27.1*  MCV 110.1*  --  101.9* 100.5*  --  97.2 99.6  PLT 105*  --  103* 92*  --  85* 91*   < > = values in this interval not displayed.   Basic Metabolic Panel: Recent Labs  Lab 10/28/19 1024 10/28/19 1024 10/28/19 2024 10/30/19 0536 10/31/19 0810 11/01/19 0533 11/02/19 0508  NA 137  --   --  135 131* 133* 135  K 3.6  --   --  4.0 3.6 3.2* 3.3*  CL 97*  --   --  94* 94* 93* 93*  CO2 25  --   --  26 24 26 28   GLUCOSE 57*  --   --  82 89 136* 118*  BUN 31*  --   --  38* 35* 30* 28*  CREATININE 13.42*   < > 12.94* 13.29* 12.73* 11.49* 11.96*  CALCIUM 8.8*  --   --  8.5* 7.9* 8.3* 8.4*  PHOS  --   --   --  5.3*  --   --   --    < > = values in this interval not displayed.   GFR: Estimated Creatinine Clearance: 7 mL/min (A) (by C-G formula based on SCr of 11.96 mg/dL (H)). Liver Function Tests: Recent Labs  Lab 10/28/19 1024  AST 24  ALT 20  ALKPHOS 89  BILITOT 0.5  PROT 7.1  ALBUMIN  2.5*    Recent Labs  Lab 10/28/19 1024  LIPASE 24   No results for input(s): AMMONIA in the last 168 hours. Coagulation Profile: Recent Labs  Lab 10/31/19 0810 11/01/19 0533 11/02/19 0508  INR 1.6* 1.2 1.3*   Cardiac Enzymes: No results for input(s): CKTOTAL, CKMB, CKMBINDEX, TROPONINI in the last 168 hours. BNP (last 3 results) No results for input(s): PROBNP in the last 8760 hours. HbA1C: No results for input(s): HGBA1C in the last 72 hours. CBG: Recent Labs  Lab 10/31/19 2228 11/01/19 0645 11/01/19 1127 11/01/19 2049 11/02/19 0652  GLUCAP 197* 108* 89 151* 120*   Lipid Profile: No results for input(s): CHOL, HDL, LDLCALC, TRIG, CHOLHDL, LDLDIRECT in the last 72 hours. Thyroid Function Tests: No results for input(s): TSH, T4TOTAL, FREET4, T3FREE, THYROIDAB in the last 72 hours. Anemia Panel: No results for input(s): VITAMINB12, FOLATE, FERRITIN, TIBC, IRON, RETICCTPCT in the last 72 hours. Sepsis Labs: Recent Labs  Lab 10/28/19 2042 10/29/19 0436  LATICACIDVEN 1.5 1.7    Recent Results (from the past 240 hour(s))  SARS CORONAVIRUS 2 (TAT 6-24 HRS) Nasopharyngeal Nasopharyngeal Swab     Status: None   Collection Time: 10/28/19  6:27 PM   Specimen: Nasopharyngeal Swab  Result Value Ref Range Status   SARS Coronavirus 2 NEGATIVE NEGATIVE Final    Comment: (NOTE) SARS-CoV-2 target nucleic acids are NOT DETECTED. The SARS-CoV-2 RNA is generally detectable in upper and lower respiratory specimens during the acute phase of infection. Negative results do not preclude SARS-CoV-2 infection, do not rule out co-infections with other pathogens, and should not be used as the sole basis for treatment or other patient management decisions. Negative results must be combined with clinical observations, patient history, and epidemiological information. The expected result is Negative. Fact Sheet for Patients: SugarRoll.be Fact Sheet for Healthcare  Providers: https://www.woods-mathews.com/ This test is not yet approved or cleared by the [place] FDA and  has been authorized for detection and/or diagnosis of SARS-CoV-2 by FDA under an Emergency Use Authorization (EUA). This EUA will remain  in effect (meaning this test can be used) for the duration of the COVID-19 declaration under Section 56 4(b)(1) of the Act, 21 U.S.C. section 360bbb-3(b)(1), unless the authorization is terminated or revoked sooner. Performed at [facility], [place] [address] [place], [place] [address]   Gram stain     Status: None   Collection Time: 10/28/19  9:40 PM   Specimen: Body Fluid  Result Value Ref Range Status   Specimen Description FLUID PERITONEAL DIALYSATE  Final   Special Requests NONE  Final   Gram Stain   Final    MODERATE WBC PRESENT, PREDOMINANTLY PMN NO ORGANISMS SEEN Performed at [facility], [address], [place], [place] [address]    Report Status 10/29/2019 FINAL  Final  Culture, body fluid-bottle     Status: None (Preliminary result)   Collection Time: 10/28/19  9:40 PM   Specimen: Fluid  Result Value Ref Range Status   Specimen Description FLUID PERITONEAL DIALYSATE  Final   Special Requests   Final    BOTTLES DRAWN AEROBIC AND ANAEROBIC Blood Culture adequate volume   Culture   Final    FEW PSEUDOMONAS FLUORESCENS CRITICAL RESULT CALLED TO, READ BACK BY AND VERIFIED WITH: Evangeline Dakin RN, AT [phone] 11/01/19 Performed at [facility], [address], [place], [place] [address]    Report Status PENDING  Incomplete  Culture, blood (routine x 2)  Status: None (Preliminary result)   Collection Time: 10/29/19 12:46 AM   Specimen: BLOOD  Result Value Ref Range Status   Specimen Description BLOOD RIGHT HAND  Final   Special Requests   Final    BOTTLES DRAWN AEROBIC AND ANAEROBIC Blood Culture adequate volume   Culture   Final    NO GROWTH 3 DAYS Performed at [facility], [address] [place], [place] [address]    Report Status PENDING  Incomplete  Culture, blood (routine x 2)     Status: None (Preliminary result)   Collection Time: 10/29/19 12:47 AM   Specimen: BLOOD  Result Value Ref Range Status   Specimen Description BLOOD RIGHT FINGER  Final   Special Requests   Final    BOTTLES DRAWN AEROBIC ONLY Blood Culture adequate volume   Culture   Final    NO GROWTH 3 DAYS Performed at [facility], [place] [address] [place], [place] [address]    Report Status PENDING  Incomplete  Gastrointestinal Panel by PCR , Stool     Status: None   Collection Time: 10/30/19 12:43 PM   Specimen: Stool  Result Value Ref Range Status   Campylobacter species NOT DETECTED NOT DETECTED Final   Plesimonas shigelloides NOT DETECTED NOT DETECTED Final   Salmonella species NOT DETECTED NOT DETECTED Final   Yersinia enterocolitica NOT DETECTED NOT DETECTED Final   Vibrio species NOT DETECTED NOT DETECTED Final   Vibrio cholerae NOT DETECTED NOT DETECTED Final   Enteroaggregative E coli (EAEC) NOT DETECTED NOT DETECTED Final   Enteropathogenic E coli (EPEC) NOT DETECTED NOT DETECTED Final   Enterotoxigenic E coli (ETEC) NOT DETECTED NOT DETECTED Final   Shiga like toxin producing E coli (STEC) NOT DETECTED NOT DETECTED Final   Shigella/Enteroinvasive E coli (EIEC) NOT DETECTED NOT DETECTED Final   Cryptosporidium NOT DETECTED NOT DETECTED Final   Cyclospora cayetanensis NOT DETECTED NOT DETECTED Final   Entamoeba histolytica NOT DETECTED NOT DETECTED Final   Giardia lamblia NOT DETECTED NOT DETECTED Final   Adenovirus F40/41 NOT DETECTED NOT DETECTED Final   Astrovirus NOT DETECTED NOT DETECTED Final   Norovirus GI/GII NOT DETECTED NOT DETECTED Final   Rotavirus A NOT DETECTED NOT DETECTED Final   Sapovirus (I, II, IV, and V) NOT DETECTED NOT DETECTED Final    Comment: Performed at [facility], [place] [place], [place] [address]  C Difficile Quick Screen w  PCR reflex     Status: Abnormal   Collection Time: 10/30/19 12:43 PM   Specimen: Stool  Result Value Ref Range Status   C Diff antigen POSITIVE (A) NEGATIVE Final   C Diff toxin NEGATIVE NEGATIVE Final   C Diff interpretation Results are indeterminate. See PCR results.  Final    Comment: Performed at [facility], [place] [address] [place], [place] [address]  C. Diff by PCR, Reflexed     Status: Abnormal   Collection Time: 10/30/19 12:43 PM  Result Value Ref Range Status   Toxigenic C. Difficile by PCR POSITIVE (A) NEGATIVE Final    Comment: Positive for toxigenic C. difficile with little to no toxin production. Only treat if clinical presentation suggests symptomatic illness. Performed at [facility], [place] [address], [place], [place] [address]   Surgical pcr screen     Status: None   Collection Time: 11/01/19  9:51 PM   Specimen: Nasal Mucosa; Nasal Swab  Result Value Ref Range Status  MRSA, PCR NEGATIVE NEGATIVE Final   Staphylococcus aureus NEGATIVE NEGATIVE Final    Comment: (NOTE) The Xpert SA Assay (FDA approved for NASAL specimens in patients 11 years of age and older), is one component of a comprehensive surveillance program. It is not intended to diagnose infection nor to guide or monitor treatment. Performed at [facility], [place] [address] [place], [place] [address]          Radiology Studies: VAS [place] UPPER EXT VEIN MAPPING (PRE-OP AVF)  Result Date: 11/01/2019 UPPER EXTREMITY VEIN MAPPING  Indications: Pre-dialysis access. History: ESRD on peritoneal dialysis. Previous left upper arm fistula that was          usable and right upper extremity fistula that was never usable. History          of renal transplant 2009.  Limitations: IV, body habitus Comparison Study: No prior study on file Performing Technologist: Sharion Dove RVS  Examination Guidelines: A complete evaluation includes B-mode imaging, spectral Doppler, color Doppler, and power Doppler  as needed of all accessible portions of each vessel. Bilateral testing is considered an integral part of a complete examination. Limited examinations for reoccurring indications may be performed as noted. +-----------------+-------------+----------+--------------+ Right Cephalic   Diameter (cm)Depth (cm)   Findings    +-----------------+-------------+----------+--------------+ Prox upper arm       0.36        0.33                  +-----------------+-------------+----------+--------------+ Mid upper arm        0.30        0.47                  +-----------------+-------------+----------+--------------+ Dist upper arm       0.29        0.27     branching    +-----------------+-------------+----------+--------------+ Antecubital fossa    0.19        0.30                  +-----------------+-------------+----------+--------------+ Prox forearm         0.17        0.39                  +-----------------+-------------+----------+--------------+ Mid forearm          0.15        0.37     branching    +-----------------+-------------+----------+--------------+ Wrist                                   not visualized +-----------------+-------------+----------+--------------+ +-----------------+-------------+----------+--------------+ Right Basilic    Diameter (cm)Depth (cm)   Findings    +-----------------+-------------+----------+--------------+ Prox upper arm       0.55        0.65       origin     +-----------------+-------------+----------+--------------+ Mid upper arm        0.46        0.60                  +-----------------+-------------+----------+--------------+ Dist upper arm       0.46        0.48     branching    +-----------------+-------------+----------+--------------+ Antecubital fossa    0.37        0.54                  +-----------------+-------------+----------+--------------+  Prox forearm         0.22        0.23     branching     +-----------------+-------------+----------+--------------+ Mid forearm          0.22        0.24                  +-----------------+-------------+----------+--------------+ Wrist                                   not visualized +-----------------+-------------+----------+--------------+ +-----------------+-------------+----------+--------------+ Left Cephalic    Diameter (cm)Depth (cm)   Findings    +-----------------+-------------+----------+--------------+ Mid upper arm                           not visualized +-----------------+-------------+----------+--------------+ Dist upper arm                          not visualized +-----------------+-------------+----------+--------------+ Antecubital fossa    0.45        0.22     branching    +-----------------+-------------+----------+--------------+ Prox forearm         0.26        0.35     branching    +-----------------+-------------+----------+--------------+ Mid forearm          0.20        0.64                  +-----------------+-------------+----------+--------------+ Wrist                                   not visualized +-----------------+-------------+----------+--------------+ +-----------------+-------------+----------+--------+ Left Basilic     Diameter (cm)Depth (cm)Findings +-----------------+-------------+----------+--------+ Mid upper arm       735.00       1.19    origin  +-----------------+-------------+----------+--------+ Dist upper arm       0.68        0.75            +-----------------+-------------+----------+--------+ Antecubital fossa    0.24        0.63            +-----------------+-------------+----------+--------+ Prox forearm         0.30        0.27            +-----------------+-------------+----------+--------+ Mid forearm          0.23        0.47            +-----------------+-------------+----------+--------+ Wrist                0.27        0.31             +-----------------+-------------+----------+--------+ *See table(s) above for measurements and observations.  Diagnosing physician: Servando Snare MD Electronically signed by Servando Snare MD on 11/01/2019 at 1:40:45 PM.    Final         Scheduled Meds: . sodium chloride   Intravenous Once  . amLODipine  5 mg Oral Daily  . calcitRIOL  0.75 mcg Oral Daily  . darbepoetin (ARANESP) injection - NON-DIALYSIS  150 mcg Subcutaneous Q Thu-1800  . doxazosin  4 mg Oral QHS  . ferrous sulfate  325 mg Oral BID WC  . folic acid  1  mg Oral Daily  . gentamicin cream  1 application Topical Daily  . insulin aspart  0-5 Units Subcutaneous QHS  . insulin aspart  0-6 Units Subcutaneous TID WC  . lisinopril  20 mg Oral Daily  . metoprolol tartrate  100 mg Oral BID  . valGANciclovir  450 mg Oral Q M,W,F  . vancomycin  125 mg Oral TID AC & HS  . Warfarin - Pharmacist Dosing Inpatient   Does not apply q1600   Continuous Infusions:    LOS: 5 days    Time spent:35 mins. More than 50% of that time was spent in counseling and/or coordination of care.      Shelly Coss, MD [facility] P4/06/2020, 7:36 AM

## 2019-11-02 NOTE — Anesthesia Preprocedure Evaluation (Signed)
Anesthesia Evaluation  Patient identified by MRN, date of birth, ID band Patient awake    Reviewed: Allergy & Precautions, NPO status , Patient's Chart, lab work & pertinent test results  Airway Mallampati: II  TM Distance: >3 FB Neck ROM: Full    Dental no notable dental hx.    Pulmonary neg pulmonary ROS, former smoker,    Pulmonary exam normal breath sounds clear to auscultation       Cardiovascular hypertension, Pt. on medications and Pt. on home beta blockers Normal cardiovascular exam Rhythm:Regular Rate:Normal     Neuro/Psych negative neurological ROS  negative psych ROS   GI/Hepatic negative GI ROS, Neg liver ROS,   Endo/Other  diabetes, Insulin Dependent  Renal/GU DialysisRenal disease  negative genitourinary   Musculoskeletal negative musculoskeletal ROS (+)   Abdominal   Peds negative pediatric ROS (+)  Hematology  (+) anemia ,   Anesthesia Other Findings   Reproductive/Obstetrics negative OB ROS                             Anesthesia Physical Anesthesia Plan  ASA: III  Anesthesia Plan: General   Post-op Pain Management:    Induction: Intravenous  PONV Risk Score and Plan: 2 and Ondansetron, Dexamethasone and Treatment may vary due to age or medical condition  Airway Management Planned: LMA  Additional Equipment:   Intra-op Plan:   Post-operative Plan: Extubation in OR  Informed Consent: I have reviewed the patients History and Physical, chart, labs and discussed the procedure including the risks, benefits and alternatives for the proposed anesthesia with the patient or authorized representative who has indicated his/her understanding and acceptance.     Dental advisory given  Plan Discussed with: CRNA and Surgeon  Anesthesia Plan Comments:         Anesthesia Quick Evaluation

## 2019-11-02 NOTE — TOC Initial Note (Signed)
Transition of Care [facility]) - Initial/Assessment Note    Patient Details  Name: James [name] MRN: [id number] Date of Birth: [birth year]  Transition of Care [facility]) CM/SW Contact:    Bartholomew Crews, RN Phone Number: [phone] 11/02/2019, 4:25 PM  Clinical Narrative:                  Spoke with patient at the bedside. PTA home alone. He states that his neighbor and sister assist him with shopping and transportation needs.   States that he will use Medicaid transportation, like [employer], to get to and from HD stating that is what he had done before.   Discussed recommendations for [facility] PT. Patient states that he was recently discharged from therapy through [facility]. Referral pending with [facility].   Patient states that he has caregiver services every other day through [facility]. He says the caregiver comes for about 2 hours every other day. Patient would like an increase in hours.   TOC following for transition needs.   Expected Discharge Plan: [place] Barriers to Discharge: Continued Medical Work up   Patient Goals and CMS Choice Patient states their goals for this hospitalization and ongoing recovery are:: return home CMS Medicare.gov Compare Post Acute Care list provided to:: Patient Choice offered to / list presented to : Patient  Expected Discharge Plan and Services Expected Discharge Plan: [place] In-house Referral: Clinical Social Work Discharge Planning Services: CM Consult Post Acute Care Choice: Agenda arrangements for the past 2 months: Apartment                           HH Arranged: PT, OT          Prior Living Arrangements/Services Living arrangements for the past 2 months: Apartment Lives with:: Self Patient language and need for interpreter reviewed:: Yes Do you feel safe going back to the place where you live?: Yes      Need for Family Participation in Patient Care: Yes (Comment) Care giver  support system in place?: Yes (comment)   Criminal Activity/Legal Involvement Pertinent to Current Situation/Hospitalization: No - Comment as needed  Activities of Daily Living Home Assistive Devices/Equipment: CBG Meter, Prosthesis, Wheelchair, Walker (specify type)(PD machine) ADL Screening (condition at time of admission) Patient's cognitive ability adequate to safely complete daily activities?: Yes Is the patient deaf or have difficulty hearing?: No Does the patient have difficulty seeing, even when wearing glasses/contacts?: No Does the patient have difficulty concentrating, remembering, or making decisions?: No Patient able to express need for assistance with ADLs?: Yes Does the patient have difficulty dressing or bathing?: No Independently performs ADLs?: Yes (appropriate for developmental age) Does the patient have difficulty walking or climbing stairs?: Yes Weakness of Legs: None(R BKA) Weakness of Arms/Hands: None  Permission Sought/Granted                  Emotional Assessment Appearance:: Appears older than stated age Attitude/Demeanor/Rapport: Engaged Affect (typically observed): Accepting Orientation: : Oriented to Self, Oriented to  Time, Oriented to Place, Oriented to Situation Alcohol / Substance Use: Not Applicable Psych Involvement: No (comment)  Admission diagnosis:  Spontaneous bacterial peritonitis ([place]) [K65.2] Generalized abdominal pain [R10.84] Patient Active Problem List   Diagnosis Date Noted  . Spontaneous bacterial peritonitis ([place]) 10/28/2019  . 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 Hypotension 03/27/2019  . Anemia   . Renal transplant disorder   .  Diarrhea 08/13/2015  . Tachycardia 04/09/2014  . ESRD on peritoneal dialysis ([place])   . Diabetes mellitus without complication ([place])   . Hypertension    PCP:  [name], MD Pharmacy:   [facility], [address] [place] [address] Phone: [phone] Fax: [phone]  [facility] [place], [place] - [address] AT [facility] [address] [place] [address] Phone: [phone] Fax: [phone]     Social Determinants of Health (SDOH) Interventions    Readmission Risk Interventions No flowsheet data found.

## 2019-11-02 NOTE — Progress Notes (Signed)
Physical Therapy Treatment Patient Details Name: James [name] MRN: [id number] DOB: 02/09/1971 Today's Date: 11/02/2019    History of Present Illness Patient is a 49 year old male with history of end-stage renal disease status post renal transplant in 2008  , with failure in 2018 now on peritoneal dialysis, diabetes type 2, R BKA, hypertension, hyperlipidemia who presented to the emergency department with 2 days complaints of abdomen pain, nausea, vomiting, diarrhea.  Also reported fever, chills.  Patient admitted for the management of possible spontaneous bacterial peritonitis.  Underwent paracentesis with confirmatory diagnosis of SBP.    PT Comments    Continuing work on functional mobility and activity tolerance;  Session focused on obtaining orthostatic BPs; Overall BPs are high (had not received BP meds yet); Still, notabel for a drop in SBP between supine and standing off 43mmHg, concurrent with lightheadedness;     11/02/19 0851 11/02/19 0859  Vital Signs  Patient Position (if appropriate) Orthostatic Vitals  --   Orthostatic Lying   BP- Lying (!) 166/94  --   Pulse- Lying 97  --   Orthostatic Sitting  BP- Sitting (!) 159/100 (!) 160/99  Pulse- Sitting 109 103  Orthostatic Standing at 0 minutes  BP- Standing at 0 minutes (!) 144/100  --   Pulse- Standing at 0 minutes 126  --    We began discussing pre-prosthetic exercises, and James [name] seemed interested; Worth considering a Prosthetist consult   Follow Up Recommendations  Home health PT(with possible incr PCA services)     Equipment Recommendations  None recommended by PT    Recommendations for Other Services  Consultation with Prosthetist     Precautions / Restrictions Precautions Precautions: Fall Precaution Comments: dizzy after 1-2 minutes in standing    Mobility  Bed Mobility Overal bed mobility: Independent                Transfers Overall transfer level: Needs assistance Equipment used: Rolling  walker (2 wheeled) Transfers: Sit to/from Stand Sit to Stand: Min guard(without physical contact)         General transfer comment: better hand placement to push up from bed; cues to self-monitor for activity tolerance; Tried to stand long enough for 3 minute BP, however noting what looks like LLE/quad fatigue, and sat back down to bed   Ambulation/Gait                 Stairs             Wheelchair Mobility    Modified Rankin (Stroke Patients Only)       Balance                                            Cognition Arousal/Alertness: Awake/alert Behavior During Therapy: WFL for tasks assessed/performed Overall Cognitive Status: Within Functional Limits for tasks assessed                                        Exercises      General Comments General comments (skin integrity, edema, etc.): During session, he expressed interest in getting his prosthesis; We discussed pre-prosthesis exercises, and James [name] was interested; educated him on the need for strong hip and knee extensors, and flexible (long) hip and knee flexors; Demonstrated a few exercises to him, today,  he declined practicing, but agrees to exercise next session      Pertinent Vitals/Pain Pain Assessment: Faces Faces Pain Scale: Hurts little more Pain Location: Abdoment and L knee after standing Pain Descriptors / Indicators: Discomfort Pain Intervention(s): Monitored during session    Home Living                      Prior Function            PT Goals (current goals can now be found in the care plan section) Acute Rehab PT Goals Patient Stated Goal: Wants a prosthesis PT Goal Formulation: With patient Time For Goal Achievement: 11/15/19 Potential to Achieve Goals: Good Progress towards PT goals: Progressing toward goals    Frequency    Min 3X/week      PT Plan Current plan remains appropriate    Co-evaluation               AM-PAC PT "6 Clicks" Mobility   Outcome Measure  Help needed turning from your back to your side while in a flat bed without using bedrails?: None Help needed moving from lying on your back to sitting on the side of a flat bed without using bedrails?: None Help needed moving to and from a bed to a chair (including a wheelchair)?: A Little Help needed standing up from a chair using your arms (e.g., wheelchair or bedside chair)?: A Little Help needed to walk in hospital room?: A Lot Help needed climbing 3-5 steps with a railing? : A Lot 6 Click Score: 18    End of Session Equipment Utilized During Treatment: Gait belt Activity Tolerance: Other (comment)(limited by lightheadedness in standing) Patient left: in bed;with call bell/phone within reach Nurse Communication: Mobility status PT Visit Diagnosis: Unsteadiness on feet (R26.81);Other abnormalities of gait and mobility (R26.89);Dizziness and giddiness (R42)     Time: 1017-5102 PT Time Calculation (min) (ACUTE ONLY): 30 min  Charges:  $Therapeutic Activity: 23-37 mins                     Roney Marion, [place]  Acute Rehabilitation Services Pager [phone] Office [place] 11/02/2019, 11:17 AM

## 2019-11-03 DIAGNOSIS — K652 Spontaneous bacterial peritonitis: Secondary | ICD-10-CM | POA: Diagnosis not present

## 2019-11-03 LAB — CULTURE, BODY FLUID W GRAM STAIN -BOTTLE: Special Requests: ADEQUATE

## 2019-11-03 LAB — BASIC METABOLIC PANEL
Anion gap: 14 (ref 5–15)
BUN: 38 mg/dL — ABNORMAL HIGH (ref 6–20)
CO2: 27 mmol/L (ref 22–32)
Calcium: 8.3 mg/dL — ABNORMAL LOW (ref 8.9–10.3)
Chloride: 97 mmol/L — ABNORMAL LOW (ref 98–111)
Creatinine, Ser: 14.31 mg/dL — ABNORMAL HIGH (ref 0.61–1.24)
GFR calc Af Amer: 4 mL/min — ABNORMAL LOW (ref >60)
GFR calc non Af Amer: 4 mL/min — ABNORMAL LOW (ref >60)
Glucose, Bld: 136 mg/dL — ABNORMAL HIGH (ref 70–99)
Potassium: 4 mmol/L (ref 3.5–5.1)
Sodium: 138 mmol/L (ref 135–145)

## 2019-11-03 LAB — CBC WITH DIFFERENTIAL/PLATELET
Abs Immature Granulocytes: 0.01 10*3/uL (ref 0.00–0.07)
Basophils Absolute: 0 10*3/uL (ref 0.0–0.1)
Basophils Relative: 0 %
Eosinophils Absolute: 0 10*3/uL (ref 0.0–0.5)
Eosinophils Relative: 0 %
HCT: 23.9 % — ABNORMAL LOW (ref 39.0–52.0)
Hemoglobin: 8.2 g/dL — ABNORMAL LOW (ref 13.0–17.0)
Immature Granulocytes: 1 %
Lymphocytes Relative: 22 %
Lymphs Abs: 0.5 10*3/uL — ABNORMAL LOW (ref 0.7–4.0)
MCH: 34 pg (ref 26.0–34.0)
MCHC: 34.3 g/dL (ref 30.0–36.0)
MCV: 99.2 fL (ref 80.0–100.0)
Monocytes Absolute: 0.2 10*3/uL (ref 0.1–1.0)
Monocytes Relative: 9 %
Neutro Abs: 1.5 10*3/uL — ABNORMAL LOW (ref 1.7–7.7)
Neutrophils Relative %: 68 %
Platelets: 64 10*3/uL — ABNORMAL LOW (ref 150–400)
RBC: 2.41 MIL/uL — ABNORMAL LOW (ref 4.22–5.81)
RDW: 17.3 % — ABNORMAL HIGH (ref 11.5–15.5)
WBC: 2.1 10*3/uL — ABNORMAL LOW (ref 4.0–10.5)
nRBC: 0 % (ref 0.0–0.2)

## 2019-11-03 LAB — CULTURE, BLOOD (ROUTINE X 2)
Culture: NO GROWTH
Culture: NO GROWTH
Special Requests: ADEQUATE
Special Requests: ADEQUATE

## 2019-11-03 LAB — GLUCOSE, CAPILLARY
Glucose-Capillary: 160 mg/dL — ABNORMAL HIGH (ref 70–99)
Glucose-Capillary: 85 mg/dL (ref 70–99)
Glucose-Capillary: 103 mg/dL — ABNORMAL HIGH (ref 70–99)

## 2019-11-03 LAB — PROTIME-INR
INR: 1.5 — ABNORMAL HIGH (ref 0.8–1.2)
Prothrombin Time: 18.1 seconds — ABNORMAL HIGH (ref 11.4–15.2)

## 2019-11-03 LAB — FOLATE RBC
Folate, Hemolysate: >620 ng/mL
Folate, RBC: >2967 ng/mL (ref >498)
Hematocrit: 20.9 % — ABNORMAL LOW (ref 37.5–51.0)

## 2019-11-03 LAB — PARATHYROID HORMONE, INTACT (NO CA): PTH: 206 pg/mL — ABNORMAL HIGH (ref 15–65)

## 2019-11-03 MED ORDER — SODIUM CHLORIDE 0.9 % IV SOLN
2.0000 g | INTRAVENOUS | Status: DC
  Filled 2019-11-03: qty 2

## 2019-11-03 MED ORDER — LIDOCAINE-PRILOCAINE 2.5-2.5 % EX CREA: 1.0000 "application " | TOPICAL_CREAM | CUTANEOUS | Status: DC | PRN

## 2019-11-03 MED ORDER — ALTEPLASE 2 MG IJ SOLR: 2.0000 mg | Freq: Once | INTRAMUSCULAR | Status: DC | PRN

## 2019-11-03 MED ORDER — WARFARIN SODIUM 5 MG PO TABS: 5.0000 mg | ORAL_TABLET | Freq: Once | ORAL | Status: DC

## 2019-11-03 MED ORDER — SODIUM CHLORIDE 0.9 % IV SOLN: 100.0000 mL | INTRAVENOUS | Status: DC | PRN

## 2019-11-03 MED ORDER — LISINOPRIL 40 MG PO TABS
40.0000 mg | ORAL_TABLET | Freq: Every day | ORAL | Status: DC
  Administered 2019-11-03 – 2019-11-04 (×2): 40 mg via ORAL
  Filled 2019-11-03 (×2): qty 1

## 2019-11-03 MED ORDER — SODIUM CHLORIDE 0.9 % IV SOLN: 2.0000 g | INTRAVENOUS | Status: DC

## 2019-11-03 MED ORDER — PENTAFLUOROPROP-TETRAFLUOROETH EX AERO: 1.0000 "application " | INHALATION_SPRAY | CUTANEOUS | Status: DC | PRN

## 2019-11-03 MED ORDER — HEPARIN SODIUM (PORCINE) 1000 UNIT/ML DIALYSIS: 1000.0000 [IU] | INTRAMUSCULAR | Status: DC | PRN

## 2019-11-03 MED ORDER — AMLODIPINE BESYLATE 10 MG PO TABS
10.0000 mg | ORAL_TABLET | Freq: Every day | ORAL | Status: DC
  Administered 2019-11-03 – 2019-11-04 (×2): 10 mg via ORAL
  Filled 2019-11-03 (×2): qty 1

## 2019-11-03 MED ORDER — CIPROFLOXACIN HCL 500 MG PO TABS
500.0000 mg | ORAL_TABLET | Freq: Every day | ORAL | Status: DC
  Administered 2019-11-03 – 2019-11-04 (×2): 500 mg via ORAL
  Filled 2019-11-03 (×2): qty 1

## 2019-11-03 MED ORDER — LIDOCAINE HCL (PF) 1 % IJ SOLN: 5.0000 mL | INTRAMUSCULAR | Status: DC | PRN

## 2019-11-03 MED FILL — Peritoneal Dialysis Solutions 394 MOSM/L: INTRAPERITONEAL | Qty: 5000 | Status: AC

## 2019-11-03 NOTE — Progress Notes (Addendum)
PROGRESS NOTE    [name]  URK:[id number] DOB: 1971/01/03 DOA: 10/28/2019 PCP: [name], MD   Brief Narrative:  Patient is a 49 year old male with history of end-stage renal disease status post renal transplant in 2008  , with failure in 2018 now on peritoneal dialysis, diabetes type 2, hypertension, hyperlipidemia who presented to the emergency department with 2 days complaints of abdomen pain, nausea, vomiting, diarrhea.  Also reported fever, chills.  Patient admitted for the management of possible spontaneous bacterial peritonitis.  Underwent paracentesis with confirmatory diagnosis of SBP.  Peritoneal fluid culture showing Pseudomonas fluorescence so started on ceftazidime which has been changed to Cipro now.  Nephrology consulted and following.He was having diarrhea, abdominal pain.  C. Difficile also came out to be positive, started on oral vancomycin. Underwent  tunneled dialysis catheter and right arm AV fistula/graft today by vascular surgery on 11/02/2019. Underwent  first session of hemodialysis today.  Plan for removal of peritoneal dialysis catheter by general surgery either today or tomorrow.  He needs outpatient dialysis center before discharge.  Assessment & Plan:   Principal Problem:   Spontaneous bacterial peritonitis ([place]) Active Problems:   ESRD on peritoneal dialysis ([place])   Diabetes mellitus without complication ([place])   Hypertension   Renal transplant recipient   SBP: Underwent paracentesis with findings of SBP.  He has been febrile, tachycardic on presentation. Peritoneal fluid culture,blood culture NGTD.  Gram stain did not show any organisms.  He had history of staphylococcal hemolyticus peritonitis back in 03/2019 and was treated with vancomycin. Peritoneal fluid culture this time showed  Pseudomonas fluorescence, so started on ceftazidime.  Susceptibilities showed Pseudomonas is sensitive to ciprofloxacin.  Antibiotics changed  to ciprofloxacin to complete  total of 14 days course.  Abdominal pain/C diff/diarrhea: Presented with abdominal pain,diarrhoea CT abdomen/pelvis did not show any signs of bowel inflammation.Found to have Newly enlarged right external iliac lymph node, nonspecific. C. difficile antigen is positive, toxin is negative but patient was having diarrhea/abdominal pain so we started on oral vancomycin.We will continue for vanco for 7 days past after  finishing oral ciprofloxacin.  Diarrhea , abdominal pain have resolved.  ESRD on peritoneal dialysis: Nephrology following.  He has a peritoneal  dialysis catheter.He has history of renal transplant which ultimately failed.  He follows with Dr. Percell Belt at [facility].  He takes prednisone, Prograf at home which are on hold due to possible underlying sepsis from SBP.we will check with nephrology before discharge if they need to be resumed. Patient wanted to stop peritoneal dialysis and go for hemodialysis now. Vascular surgery placed  temporary dialysis catheter , AV fistula on the right arm on 11/02/2019.  Underwent for session of hemodialysis today.  Plan is to remove peritoneal dialysis catheter by general surgery. he will need outpatient dialysis center before discharge.  Diabetes type 2: Continue sliding scale insulin.    He is on insulin at home.  He had episodes of hypoglycemia while hospitalized.  Hypertension: He has been hypertensive.  Continue his home medications.  Added amlodipine.  Increased the dose of lisinopril.  Continue metoprolol.  History of peripheral vascular disease: Status post recent right BKA.  Continue current meds  Pancytopenia: Chronic.  On looking at his previous blood works, he has chronic pancytopenia but it has worsened. S/p 1 unit of transfusion of  PRBC on this admission.  Normal iron, high ferritin, high vitamin B12.  Also has thrombocytopenia and leukopenia. Positive stool occult blood.  It could be associated  with his colitis.  His hemoglobin has been   running in the range of 7 since last year, most likely this is associated with his ESRD.  He does not have any active bleeding at present.  He is also not a candidate for any kind of procedure due to possible underlying sepsis,C diff.    I will defer GI consultation. He has history of B-cell lymphoma, currently in remission.  He follows with hematology/oncology Dr. Cassell Clement, [facility].  I will recommend to follow-up with her as an outpatient in 2 weeks after discharge.  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 Restarted . Monitor INR  Hypokalemia: Being monitored and supplemented.  Generalized weakness, debility, deconditioning: PT recommended home health.         DVT prophylaxis:Coumadin Code Status: Full Family Communication: None present at the bedside.  Patient states he does not have any family members. Status is: Inpatient  Remains inpatient appropriate because:Ongoing diagnostic testing needed not appropriate for outpatient work up. Plan for removal of peritoneal dialysis catheter today or tomorrow, needs nephrology clearance before discharge, needs outpatient hemodialysis center.   Dispo: The patient is from: Home              Anticipated d/c is to: Home              Anticipated d/c date is: 2 days              Patient currently is not medically stable to d/c.        Consultants: Nephrology, vascular surgery  Procedures: Paracentesis, PermCath placement, AV fistula placement  Antimicrobials:  Anti-infectives (From admission, onward)   Start     Dose/Rate Route Frequency Ordered Stop   11/02/19 0900  cefTAZidime (FORTAZ) 1 g in sodium chloride 0.9 % 100 mL IVPB     1 g 200 mL/hr over 30 Minutes Intravenous Every 24 hours 11/02/19 0827     11/01/19 1800  vancomycin (VANCOCIN) 125 mg, heparin 2,500 Units in dialysis solution 1.5% low-MG/low-CA 5,000 mL dialysis solution  Status:  Discontinued      Peritoneal Dialysis Daily-1800  11/01/19 0834 11/01/19 0840   11/01/19 1800  vancomycin (VANCOCIN) 125 mg, heparin 2,500 Units in dialysis solution 2.5% low-MG/low-CA 5,000 mL dialysis solution  Status:  Discontinued      Peritoneal Dialysis Daily-1800 11/01/19 0840 11/02/19 0735   11/01/19 1800  vancomycin (VANCOCIN) 125 mg, heparin 2,500 Units in dialysis solution 1.5% low-MG/low-CA 5,000 mL dialysis solution  Status:  Discontinued      Peritoneal Dialysis Daily-1800 11/01/19 1304 11/02/19 0735   10/31/19 0830  vancomycin (VANCOCIN) 50 mg/mL oral solution 125 mg     125 mg Oral 3 times daily before meals & bedtime 10/31/19 0756     10/30/19 1000  valGANciclovir (VALCYTE) 450 MG tablet TABS 450 mg    Note to Pharmacy: Take one tablet (450 mg) by mouth on Monday, Wednesday, Friday before dialysis     450 mg Oral Every M-W-F 10/29/19 1332     10/29/19 1500  ceFEPIme (MAXIPIME) 1 g in sodium chloride 0.9 % 100 mL IVPB  Status:  Discontinued     1 g 200 mL/hr over 30 Minutes Intravenous Every 24 hours 10/29/19 1350 10/31/19 0759   10/29/19 0900  cefTRIAXone (ROCEPHIN) 2 g in sodium chloride 0.9 % 100 mL IVPB  Status:  Discontinued     2 g 200 mL/hr over 30 Minutes Intravenous Every 24  hours 10/29/19 0809 10/29/19 1332   10/28/19 1945  vancomycin (VANCOREADY) IVPB 1500 mg/300 mL     1,500 mg 150 mL/hr over 120 Minutes Intravenous  Once 10/28/19 1944 10/29/19 0132   10/28/19 1930  vancomycin (VANCOCIN) 1,500 mg in sodium chloride 0.9 % 500 mL IVPB  Status:  Discontinued     1,500 mg 250 mL/hr over 120 Minutes Intravenous  Once 10/28/19 1922 10/28/19 1943   10/28/19 1930  cefTAZidime (FORTAZ) 1 g in sodium chloride 0.9 % 100 mL IVPB  Status:  Discontinued     1 g 200 mL/hr over 30 Minutes Intravenous Every 24 hours 10/28/19 1922 10/29/19 0321   10/28/19 1922  vancomycin variable dose per unstable renal function (pharmacist dosing)  Status:  Discontinued      Does not apply See admin instructions 10/28/19 1922 11/01/19 0852        Subjective: Patient seen and examined at the bedside this morning.  Hemodynamically stable.  He was at dialysis suite undergoing his first hemodialysis session.  Denies any complaints.  No diarrhea or abdominal pain.  Objective: Vitals:   11/02/19 1455 11/02/19 1510 11/02/19 1601 11/03/19 0420  BP: (!) 175/88 (!) 182/87 (!) 170/65 (!) 172/82  Pulse: (!) 111 (!) 112 89 93  Resp: 15 14 18 20   Temp:  97.6 F (36.4 C)  98.4 F (36.9 C)  TempSrc:    Oral  SpO2: 100% 100% 100% 99%  Weight:      Height:        Intake/Output Summary (Last 24 hours) at 11/03/2019 0725 Last data filed at 11/03/2019 0600 Gross per 24 hour  Intake 920.43 ml  Output 150 ml  Net 770.43 ml   Filed Weights   11/01/19 1845 11/01/19 2051 11/02/19 0820  Weight: 69.8 kg 69.5 kg 65.8 kg    Examination:   General exam: Comfortable Respiratory system: Bilateral equal air entry, normal vesicular breath sounds, no wheezes or crackles  Cardiovascular system: S1 & S2 heard, RRR. No JVD, murmurs, rubs, gallops or clicks.  Temporary dialysis catheter on the chest. Gastrointestinal system: Abdomen is nondistended, soft and nontender. No organomegaly or masses felt. Normal bowel sounds heard..  Peritoneal Dialysis catheter Central nervous system: Alert and oriented. No focal neurological deficits. Extremities: No edema, no clubbing ,no cyanosis, right BKA  skin: No rashes, lesions or ulcers,no icterus ,no pallor   Data Reviewed: I have personally reviewed following labs and imaging studies  CBC: Recent Labs  Lab 10/30/19 0536 10/30/19 0536 10/31/19 0810 10/31/19 1839 11/01/19 0533 11/02/19 0508 11/03/19 0332  WBC 2.1*  --  1.7*  --  1.5* 1.9* 2.1*  NEUTROABS 1.1*  --  0.7*  --  0.6* 0.9* 1.5*  HGB 7.5*   < > 6.7* 8.3* 8.6* 9.2* 8.2*  HCT 21.8*  20.9*   < > 19.4* 23.6* 24.7* 27.1* 23.9*  MCV 101.9*  --  100.5*  --  97.2 99.6 99.2  PLT 103*  --  92*  --  85* 91* 64*   < > = values in this interval not  displayed.   Basic Metabolic Panel: Recent Labs  Lab 10/30/19 0536 10/31/19 0810 11/01/19 0533 11/02/19 0508 11/03/19 0332  NA 135 131* 133* 135 138  K 4.0 3.6 3.2* 3.3* 4.0  CL 94* 94* 93* 93* 97*  CO2 26 24 26 28 27   GLUCOSE 82 89 136* 118* 136*  BUN 38* 35* 30* 28* 38*  CREATININE 13.29* 12.73* 11.49* 11.96* 14.31*  CALCIUM 8.5* 7.9* 8.3* 8.4* 8.3*  PHOS 5.3*  --   --  5.0*  --    GFR: Estimated Creatinine Clearance: 5.8 mL/min (A) (by C-G formula based on SCr of 14.31 mg/dL (H)). Liver Function Tests: Recent Labs  Lab 10/28/19 1024 11/02/19 0508  AST 24  --   ALT 20  --   ALKPHOS 89  --   BILITOT 0.5  --   PROT 7.1  --   ALBUMIN 2.5* 1.8*   Recent Labs  Lab 10/28/19 1024  LIPASE 24   No results for input(s): AMMONIA in the last 168 hours. Coagulation Profile: Recent Labs  Lab 10/31/19 0810 11/01/19 0533 11/02/19 0508 11/03/19 0332  INR 1.6* 1.2 1.3* 1.5*   Cardiac Enzymes: No results for input(s): CKTOTAL, CKMB, CKMBINDEX, TROPONINI in the last 168 hours. BNP (last 3 results) No results for input(s): PROBNP in the last 8760 hours. HbA1C: No results for input(s): HGBA1C in the last 72 hours. CBG: Recent Labs  Lab 11/01/19 2049 11/02/19 0652 11/02/19 0950 11/02/19 1426 11/02/19 1600  GLUCAP 151* 120* 79 96 107*   Lipid Profile: No results for input(s): CHOL, HDL, LDLCALC, TRIG, CHOLHDL, LDLDIRECT in the last 72 hours. Thyroid Function Tests: No results for input(s): TSH, T4TOTAL, FREET4, T3FREE, THYROIDAB in the last 72 hours. Anemia Panel: No results for input(s): VITAMINB12, FOLATE, FERRITIN, TIBC, IRON, RETICCTPCT in the last 72 hours. Sepsis Labs: Recent Labs  Lab 10/28/19 2042 10/29/19 0436  LATICACIDVEN 1.5 1.7    Recent Results (from the past 240 hour(s))  SARS CORONAVIRUS 2 (TAT 6-24 HRS) Nasopharyngeal Nasopharyngeal Swab     Status: None   Collection Time: 10/28/19  6:27 PM   Specimen: Nasopharyngeal Swab  Result Value Ref  Range Status   SARS Coronavirus 2 NEGATIVE NEGATIVE Final    Comment: (NOTE) SARS-CoV-2 target nucleic acids are NOT DETECTED. The SARS-CoV-2 RNA is generally detectable in upper and lower respiratory specimens during the acute phase of infection. Negative results do not preclude SARS-CoV-2 infection, do not rule out co-infections with other pathogens, and should not be used as the sole basis for treatment or other patient management decisions. Negative results must be combined with clinical observations, patient history, and epidemiological information. The expected result is Negative. Fact Sheet for Patients: SugarRoll.be Fact Sheet for Healthcare Providers: https://www.woods-mathews.com/ This test is not yet approved or cleared by the [place] FDA and  has been authorized for detection and/or diagnosis of SARS-CoV-2 by FDA under an Emergency Use Authorization (EUA). This EUA will remain  in effect (meaning this test can be used) for the duration of the COVID-19 declaration under Section 56 4(b)(1) of the Act, 21 U.S.C. section 360bbb-3(b)(1), unless the authorization is terminated or revoked sooner. Performed at [facility], [place] [address] [place], [place] [address]   Gram stain     Status: None   Collection Time: 10/28/19  9:40 PM   Specimen: Body Fluid  Result Value Ref Range Status   Specimen Description FLUID PERITONEAL DIALYSATE  Final   Special Requests NONE  Final   Gram Stain   Final    MODERATE WBC PRESENT, PREDOMINANTLY PMN NO ORGANISMS SEEN Performed at [facility], [address], [place], [place] [address]    Report Status 10/29/2019 FINAL  Final  Culture, body fluid-bottle     Status: None (Preliminary result)   Collection Time: 10/28/19  9:40 PM   Specimen: Fluid  Result Value Ref Range Status  Specimen Description FLUID PERITONEAL DIALYSATE  Final   Special Requests   Final    BOTTLES DRAWN  AEROBIC AND ANAEROBIC Blood Culture adequate volume   Culture   Final    FEW PSEUDOMONAS FLUORESCENS CRITICAL RESULT CALLED TO, READ BACK BY AND VERIFIED WITH: Evangeline Dakin RN, AT [phone] 11/01/19 REPEATING SUSCEPTIBILITIES Performed at [facility], [place] [address], [place], [place] [address]    Report Status PENDING  Incomplete  Culture, blood (routine x 2)     Status: None (Preliminary result)   Collection Time: 10/29/19 12:46 AM   Specimen: BLOOD  Result Value Ref Range Status   Specimen Description BLOOD RIGHT HAND  Final   Special Requests   Final    BOTTLES DRAWN AEROBIC AND ANAEROBIC Blood Culture adequate volume   Culture   Final    NO GROWTH 4 DAYS Performed at [facility], [place] [address] [place], [place] [address]    Report Status PENDING  Incomplete  Culture, blood (routine x 2)     Status: None (Preliminary result)   Collection Time: 10/29/19 12:47 AM   Specimen: BLOOD  Result Value Ref Range Status   Specimen Description BLOOD RIGHT FINGER  Final   Special Requests   Final    BOTTLES DRAWN AEROBIC ONLY Blood Culture adequate volume   Culture   Final    NO GROWTH 4 DAYS Performed at [facility], [place] [address], [place], [place] [address]    Report Status PENDING  Incomplete  Gastrointestinal Panel by PCR , Stool     Status: None   Collection Time: 10/30/19 12:43 PM   Specimen: Stool  Result Value Ref Range Status   Campylobacter species NOT DETECTED NOT DETECTED Final   Plesimonas shigelloides NOT DETECTED NOT DETECTED Final   Salmonella species NOT DETECTED NOT DETECTED Final   Yersinia enterocolitica NOT DETECTED NOT DETECTED Final   Vibrio species NOT DETECTED NOT DETECTED Final   Vibrio cholerae NOT DETECTED NOT DETECTED Final   Enteroaggregative E coli (EAEC) NOT DETECTED NOT DETECTED Final   Enteropathogenic E coli (EPEC) NOT DETECTED NOT DETECTED Final   Enterotoxigenic E coli (ETEC) NOT DETECTED NOT DETECTED Final   Shiga like toxin  producing E coli (STEC) NOT DETECTED NOT DETECTED Final   Shigella/Enteroinvasive E coli (EIEC) NOT DETECTED NOT DETECTED Final   Cryptosporidium NOT DETECTED NOT DETECTED Final   Cyclospora cayetanensis NOT DETECTED NOT DETECTED Final   Entamoeba histolytica NOT DETECTED NOT DETECTED Final   Giardia lamblia NOT DETECTED NOT DETECTED Final   Adenovirus F40/41 NOT DETECTED NOT DETECTED Final   Astrovirus NOT DETECTED NOT DETECTED Final   Norovirus GI/GII NOT DETECTED NOT DETECTED Final   Rotavirus A NOT DETECTED NOT DETECTED Final   Sapovirus (I, II, IV, and V) NOT DETECTED NOT DETECTED Final    Comment: Performed at [facility], [address] [place], [place] [address]  C Difficile Quick Screen w PCR reflex     Status: Abnormal   Collection Time: 10/30/19 12:43 PM   Specimen: Stool  Result Value Ref Range Status   C Diff antigen POSITIVE (A) NEGATIVE Final   C Diff toxin NEGATIVE NEGATIVE Final   C Diff interpretation Results are indeterminate. See PCR results.  Final    Comment: Performed at [facility], [place] [address], [place],  [address]  C. Diff by PCR, Reflexed     Status: Abnormal   Collection Time: 10/30/19  12:43 PM  Result Value Ref Range Status   Toxigenic C. Difficile by PCR POSITIVE (A) NEGATIVE Final    Comment: Positive for toxigenic C. difficile with little to no toxin production. Only treat if clinical presentation suggests symptomatic illness. Performed at [facility], [place] [address], [place], [place] [address]   Surgical pcr screen     Status: None   Collection Time: 11/01/19  9:51 PM   Specimen: Nasal Mucosa; Nasal Swab  Result Value Ref Range Status   MRSA, PCR NEGATIVE NEGATIVE Final   Staphylococcus aureus NEGATIVE NEGATIVE Final    Comment: (NOTE) The Xpert SA Assay (FDA approved for NASAL specimens in patients 73 years of age and older), is one component of a comprehensive surveillance program. It is not intended to diagnose  infection nor to guide or monitor treatment. Performed at [facility], [place] [address] [place], [place] [address]          Radiology Studies: DG Chest Port 1 View  Result Date: 11/02/2019 CLINICAL DATA:  Central line placement. EXAM: PORTABLE CHEST 1 VIEW COMPARISON:  March 14, 2019. FINDINGS: The heart size and mediastinal contours are within normal limits. Both lungs are clear. Interval placement of left internal jugular catheter with distal tip in expected position of cavoatrial junction. No pneumothorax or pleural effusion is noted. The visualized skeletal structures are unremarkable. IMPRESSION: Interval placement of left internal jugular catheter with distal tip in expected position of cavoatrial junction. No pneumothorax is noted. Electronically Signed   By: Marijo Conception M.D.   On: 11/02/2019 15:27        Scheduled Meds: . sodium chloride   Intravenous Once  . amLODipine  10 mg Oral Daily  . calcitRIOL  0.75 mcg Oral Daily  . Chlorhexidine Gluconate Cloth  6 each Topical Q0600  . darbepoetin (ARANESP) injection - NON-DIALYSIS  150 mcg Subcutaneous Q Thu-1800  . doxazosin  4 mg Oral QHS  . ferrous sulfate  325 mg Oral BID WC  . folic acid  1 mg Oral Daily  . gentamicin cream  1 application Topical Daily  . insulin aspart  0-5 Units Subcutaneous QHS  . insulin aspart  0-6 Units Subcutaneous TID WC  . lisinopril  40 mg Oral Daily  . metoprolol tartrate  100 mg Oral BID  . valGANciclovir  450 mg Oral Q M,W,F  . vancomycin  125 mg Oral TID AC & HS  . Warfarin - Pharmacist Dosing Inpatient   Does not apply q1600   Continuous Infusions: . sodium chloride    . sodium chloride    . sodium chloride 10 mL/hr at 11/02/19 1038  . cefTAZidime (FORTAZ)  IV 1 g (11/02/19 0955)     LOS: 6 days    Time spent:35 mins. More than 50% of that time was spent in counseling and/or coordination of care.      Shelly Coss, MD [facility] P4/13/2021, 7:25 AM

## 2019-11-03 NOTE — Consult Note (Signed)
[facility] Surgery Consult Note  Liborio [facility]cente [facility] Aug 03, 1970  [id number].    Requesting MD: Donato Heinz  Chief Complaint/Reason for Consult: infected PD catheter HPI:  Patient is a 49 year old male who was admitted to the hospitalist service at [facility] 10/28/19 with abdominal pain and nausea/vomiting. Patient was concerned that he may have some sort of enteritis because a family member also had a GI bug and symptoms started after eating [facility]. PMH significant for ESRD s/p kidney transplant 2008 with failure 2018, Hx of B-cell lymphoma, HTN, T2DM. Patient has been on peritoneal dialysis since 2019, PD catheter placed by Dr. Donnetta Hutching at [facility].  He has done well with this catheter since placement.  This admission his peritoneal fluid was tested and found to be positive for pseudomonas fluorescens, of note patient also had SBP with staphylococcus haemolyticus in Sept 2020.  Patient underwent right cephalic vein transposition, TDC placement and venogram 4/12 with Dr. Oneida Alar.  We have been asked to see the patient for removal of an infected PD cath.  Of note, his abdominal pain is resolved.  ROS: ROS: Please see HPI, otherwise all other systems have currently been reviewed and are negative.  He is s/p R BKA for vascular disease.  Family History  Adopted: Yes    Past Medical History:  Diagnosis Date  . Anemia   . Diabetes mellitus without complication ([place])   . Hypertension   . Influenza A   . Pneumonia 04/21/2017  . Renal disorder   . 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 He then got HD in [facility] until getting a renal transplant in 2009 at [facility], [place].  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    .  Smoker   . Tachycardia   . Transplant recipient     Past Surgical History:  Procedure Laterality Date  . BIOPSY  03/31/2019   Procedure: BIOPSY;  Surgeon: Clarene Essex, MD;  Location: [place];  Service: Endoscopy;;  . CHOLECYSTECTOMY    . COLONOSCOPY WITH PROPOFOL N/A 03/31/2019   Procedure: COLONOSCOPY WITH PROPOFOL;  Surgeon: Clarene Essex, MD;  Location: [place];  Service: Endoscopy;  Laterality: N/A;  . HIP SURGERY    . INSERTION OF DIALYSIS CATHETER Left 04/24/2017   Procedure: EXCHANGE  OF DIALYSIS CATHETER;  Surgeon: Rosetta Posner, MD;  Location: [place];  Service: Vascular;  Laterality: Left;  . KIDNEY TRANSPLANT    . PERITONEAL CATHETER INSERTION Left 08/2017  . POLYPECTOMY  03/31/2019   Procedure: POLYPECTOMY;  Surgeon: Clarene Essex, MD;  Location: [facility] ENDOSCOPY;  Service: Endoscopy;;    Social History:  reports that he has quit smoking. He smoked 0.20 packs per day. He has never used smokeless tobacco. He reports that he does not drink alcohol or use drugs.  Allergies:  Allergies  Allergen Reactions  . Tramadol Hcl Shortness Of Breath and Other (See Comments)    Tramadol HCl = Shortness of breath and Tramadol = Rash  . Nsaids Other (See Comments)    Was told by MD to not take these  . Darvon [Propoxyphene] Rash and Other (See Comments)    Broke  out on eyelid   . Tramadol Rash and Other (See Comments)    Tramadol = Rash and Tramadol HCl = Shortness of breath    Medications Prior to Admission  Medication Sig Dispense Refill  . acetaminophen (TYLENOL) 500 MG tablet Take 500-1,000 mg by mouth every 6 (six) hours as needed (pain or headaches).     . ASPIRIN LOW DOSE 81 MG EC tablet Take 81 mg by mouth daily.    . calcitRIOL (ROCALTROL) 0.25 MCG capsule Take 0.75 mcg by mouth daily.    . Cyanocobalamin (B-12) 500 MCG TABS Take 1 tablet by mouth daily.    . diphenhydrAMINE (BENADRYL) 25 mg capsule Take 25 mg by mouth every 6 (six) hours as needed for itching.    Marland Kitchen doxazosin  (CARDURA) 4 MG tablet Take 4 mg by mouth at bedtime.    . ferric citrate (AURYXIA) 1 GM 210 MG(Fe) tablet Take 210-420 mg by mouth See admin instructions. Take 210-420 mg by mouth one to three times a day with meals and 210 mg with each snack    . ferrous sulfate 325 (65 FE) MG tablet Take 325 mg by mouth 2 (two) times daily with a meal.    . folic acid (FOLVITE) 1 MG tablet Take 1 mg by mouth daily.    Marland Kitchen gabapentin (NEURONTIN) 300 MG capsule Take 300 mg by mouth at bedtime.     . insulin aspart (NOVOLOG FLEXPEN) 100 UNIT/ML FlexPen Inject 10 Units into the skin 3 (three) times daily with meals. (Patient taking differently: Inject 2-10 Units into the skin See admin instructions. Inject 2-10 units into the skin three times a day with meals, per sliding scale) 15 mL 11  . Insulin Detemir (LEVEMIR FLEXTOUCH) 100 UNIT/ML Pen Inject 15 Units into the skin at bedtime. (Patient taking differently: Inject 15 Units into the skin every other day. ) 15 mL 11  . latanoprost (XALATAN) 0.005 % ophthalmic solution Place 1 drop into both eyes at bedtime.    Marland Kitchen lisinopril (ZESTRIL) 20 MG tablet Take 20 mg by mouth daily.    Marland Kitchen loperamide (IMODIUM) 2 MG capsule Take 2 mg by mouth as needed for diarrhea or loose stools.     . magnesium oxide (MAG-OX) 400 MG tablet Take by mouth daily.    . Melatonin 5 MG CAPS Take 5 mg by mouth at bedtime.     . metoprolol tartrate (LOPRESSOR) 100 MG tablet Take 100 mg by mouth 2 (two) times daily.    . Multiple Vitamins-Minerals (CENTRUM MEN) TABS Take 1 tablet by mouth 2 (two) times daily.    Marland Kitchen omeprazole (PRILOSEC) 20 MG capsule Take 20 mg by mouth daily.    . potassium chloride SA (K-DUR) 20 MEQ tablet Take 20 mEq by mouth every Monday, Wednesday, and Friday.    . predniSONE (DELTASONE) 5 MG tablet Take 5 mg by mouth daily with breakfast.    . sodium bicarbonate 650 MG tablet Take 1,300 mg by mouth 3 (three) times daily.     . tacrolimus (PROGRAF) 0.5 MG capsule Take 0.5 mg by  mouth daily.     . temazepam (RESTORIL) 15 MG capsule Take 15 mg by mouth at bedtime.    Marland Kitchen terazosin (HYTRIN) 1 MG capsule Take 1 mg by mouth at bedtime.    . valGANciclovir (VALCYTE) 450 MG tablet Take 450 mg by mouth See admin instructions. Take one tablet (450 mg) by mouth on Monday, Wednesday, Friday before dialysis  5  .  warfarin (COUMADIN) 5 MG tablet Take 5-7.5 mg by mouth See admin instructions. 5mg  every day except 7.5mg  on Tuesday and Thursday    . zolpidem (AMBIEN) 10 MG tablet Take 10 mg by mouth at bedtime as needed for sleep.       Blood pressure (!) 137/53, pulse 98, temperature (P) 98.6 F (37 C), temperature source (P) Oral, resp. rate (P) 18, height 5\' 6"  (1.676 m), weight (P) 67.6 kg, SpO2 (P) 98 %. Physical Exam:  General: pleasant, WD, WN black male who is laying in bed in NAD HEENT: head is normocephalic, atraumatic.  Sclera are noninjected.  PERRL.  Ears and nose without any masses or lesions.  Mouth is pink and moist Heart: regular, rate, and rhythm.  Normal s1,s2. No obvious murmurs, gallops, or rubs noted.  Palpable thrill in RUE graft. Lungs: CTAB, no wheezes, rhonchi, or rales noted.  Respiratory effort nonlabored.  permcath present in Left upper chest Abd: soft, NT, ND, +BS, no masses, hernias, or organomegaly.  PD cath present with cuffs palpable under his skin in his epigastric region, just above his umbilicus. MS: all 4 extremities are symmetrical with no cyanosis, clubbing, or edema, except RLE as he has a BKA on this side. Skin: warm and dry with no masses, lesions, or rashes Neuro: Cranial nerves 2-12 grossly intact, speech is normal Psych: A&Ox3 with an appropriate affect.   Results for orders placed or performed during the hospital encounter of 10/28/19 (from the past 48 hour(s))  Glucose, capillary     Status: None   Collection Time: 11/01/19 11:27 AM  Result Value Ref Range   Glucose-Capillary 89 70 - 99 mg/dL    Comment: Glucose reference range  applies only to samples taken after fasting for at least 8 hours.  Glucose, capillary     Status: None   Collection Time: 11/01/19  4:42 PM  Result Value Ref Range   Glucose-Capillary 81 70 - 99 mg/dL    Comment: Glucose reference range applies only to samples taken after fasting for at least 8 hours.  Hepatitis B surface antigen     Status: None   Collection Time: 11/01/19  7:33 PM  Result Value Ref Range   Hepatitis B Surface Ag NON REACTIVE NON REACTIVE    Comment: Performed at [facility] [address] [place], [place] [address]  Hepatitis B surface antibody,qualitative     Status: Abnormal   Collection Time: 11/01/19  7:33 PM  Result Value Ref Range   Hep B S Ab Reactive (A) NON REACTIVE    Comment: (NOTE) Consistent with immunity, greater than 9.9 mIU/mL. Performed at [facility], [place] [address], [place], [place] [address]   Hepatitis B core antibody, total     Status: None   Collection Time: 11/01/19  7:33 PM  Result Value Ref Range   Hep B Core Total Ab NON REACTIVE NON REACTIVE    Comment: Performed at [facility] [address], [place], [place] [address]  Glucose, capillary     Status: Abnormal   Collection Time: 11/01/19  8:49 PM  Result Value Ref Range   Glucose-Capillary 151 (H) 70 - 99 mg/dL    Comment: Glucose reference range applies only to samples taken after fasting for at least 8 hours.  Surgical pcr screen     Status: None   Collection Time: 11/01/19  9:51 PM   Specimen: Nasal Mucosa; Nasal Swab  Result Value Ref Range  MRSA, PCR NEGATIVE NEGATIVE   Staphylococcus aureus NEGATIVE NEGATIVE    Comment: (NOTE) The Xpert SA Assay (FDA approved for NASAL specimens in patients 73 years of age and older), is one component of a comprehensive surveillance program. It is not intended to diagnose infection nor to guide or monitor treatment. Performed at [facility], [place] [address], [place], [place] [address]   Protime-INR      Status: Abnormal   Collection Time: 11/02/19  5:08 AM  Result Value Ref Range   Prothrombin Time 15.6 (H) 11.4 - 15.2 seconds   INR 1.3 (H) 0.8 - 1.2    Comment: (NOTE) INR goal varies based on device and disease states. Performed at [facility], [place] [address], [place], [place] [address]   CBC with Differential/Platelet     Status: Abnormal   Collection Time: 11/02/19  5:08 AM  Result Value Ref Range   WBC 1.9 (L) 4.0 - 10.5 K/uL   RBC 2.72 (L) 4.22 - 5.81 MIL/uL   Hemoglobin 9.2 (L) 13.0 - 17.0 g/dL   HCT 27.1 (L) 39.0 - 52.0 %   MCV 99.6 80.0 - 100.0 fL   MCH 33.8 26.0 - 34.0 pg   MCHC 33.9 30.0 - 36.0 g/dL   RDW 17.5 (H) 11.5 - 15.5 %   Platelets 91 (L) 150 - 400 K/uL    Comment: CONSISTENT WITH PREVIOUS RESULT Immature Platelet Fraction may be clinically indicated, consider ordering this additional test [id number]    nRBC 0.0 0.0 - 0.2 %   Neutrophils Relative % 47 %   Neutro Abs 0.9 (L) 1.7 - 7.7 K/uL   Lymphocytes Relative 38 %   Lymphs Abs 0.7 0.7 - 4.0 K/uL   Monocytes Relative 13 %   Monocytes Absolute 0.2 0.1 - 1.0 K/uL   Eosinophils Relative 1 %   Eosinophils Absolute 0.0 0.0 - 0.5 K/uL   Basophils Relative 0 %   Basophils Absolute 0.0 0.0 - 0.1 K/uL   Smear Review PLATELETS APPEAR DECREASED    Immature Granulocytes 1 %   Abs Immature Granulocytes 0.02 0.00 - 0.07 K/uL   Polychromasia PRESENT     Comment: Performed at [facility], [place] [address], [place], [place] [address]  Basic metabolic panel     Status: Abnormal   Collection Time: 11/02/19  5:08 AM  Result Value Ref Range   Sodium 135 135 - 145 mmol/L   Potassium 3.3 (L) 3.5 - 5.1 mmol/L   Chloride 93 (L) 98 - 111 mmol/L   CO2 28 22 - 32 mmol/L   Glucose, Bld 118 (H) 70 - 99 mg/dL    Comment: Glucose reference range applies only to samples taken after fasting for at least 8 hours.   BUN 28 (H) 6 - 20 mg/dL   Creatinine, Ser 11.96 (H) 0.61 - 1.24 mg/dL   Calcium 8.4 (L) 8.9 - 10.3  mg/dL   GFR calc non Af Amer 4 (L) >60 mL/min   GFR calc Af Amer 5 (L) >60 mL/min   Anion gap 14 5 - 15    Comment: Performed at [facility] [address], [place], [place] [address]  Albumin     Status: Abnormal   Collection Time: 11/02/19  5:08 AM  Result Value Ref Range   Albumin 1.8 (L) 3.5 - 5.0 g/dL    Comment: Performed at [facility], [place] [address], [place], [place] [address]  Phosphorus  Status: Abnormal   Collection Time: 11/02/19  5:08 AM  Result Value Ref Range   Phosphorus 5.0 (H) 2.5 - 4.6 mg/dL    Comment: Performed at [facility] [address] [place], [place] [address]  Glucose, capillary     Status: Abnormal   Collection Time: 11/02/19  6:52 AM  Result Value Ref Range   Glucose-Capillary 120 (H) 70 - 99 mg/dL    Comment: Glucose reference range applies only to samples taken after fasting for at least 8 hours.  Glucose, capillary     Status: None   Collection Time: 11/02/19  9:50 AM  Result Value Ref Range   Glucose-Capillary 79 70 - 99 mg/dL    Comment: Glucose reference range applies only to samples taken after fasting for at least 8 hours.  Glucose, capillary     Status: None   Collection Time: 11/02/19  2:26 PM  Result Value Ref Range   Glucose-Capillary 96 70 - 99 mg/dL    Comment: Glucose reference range applies only to samples taken after fasting for at least 8 hours.  Glucose, capillary     Status: Abnormal   Collection Time: 11/02/19  4:00 PM  Result Value Ref Range   Glucose-Capillary 107 (H) 70 - 99 mg/dL    Comment: Glucose reference range applies only to samples taken after fasting for at least 8 hours.  Protime-INR     Status: Abnormal   Collection Time: 11/03/19  3:32 AM  Result Value Ref Range   Prothrombin Time 18.1 (H) 11.4 - 15.2 seconds   INR 1.5 (H) 0.8 - 1.2    Comment: (NOTE) INR goal varies based on device and disease states. Performed at [facility], [place] [address] [place], [place] [address]    CBC with Differential/Platelet     Status: Abnormal   Collection Time: 11/03/19  3:32 AM  Result Value Ref Range   WBC 2.1 (L) 4.0 - 10.5 K/uL   RBC 2.41 (L) 4.22 - 5.81 MIL/uL   Hemoglobin 8.2 (L) 13.0 - 17.0 g/dL   HCT 23.9 (L) 39.0 - 52.0 %   MCV 99.2 80.0 - 100.0 fL   MCH 34.0 26.0 - 34.0 pg   MCHC 34.3 30.0 - 36.0 g/dL   RDW 17.3 (H) 11.5 - 15.5 %   Platelets 64 (L) 150 - 400 K/uL    Comment: CONSISTENT WITH PREVIOUS RESULT Immature Platelet Fraction may be clinically indicated, consider ordering this additional test [id number]    nRBC 0.0 0.0 - 0.2 %   Neutrophils Relative % 68 %   Neutro Abs 1.5 (L) 1.7 - 7.7 K/uL   Lymphocytes Relative 22 %   Lymphs Abs 0.5 (L) 0.7 - 4.0 K/uL   Monocytes Relative 9 %   Monocytes Absolute 0.2 0.1 - 1.0 K/uL   Eosinophils Relative 0 %   Eosinophils Absolute 0.0 0.0 - 0.5 K/uL   Basophils Relative 0 %   Basophils Absolute 0.0 0.0 - 0.1 K/uL   Immature Granulocytes 1 %   Abs Immature Granulocytes 0.01 0.00 - 0.07 K/uL    Comment: Performed at [facility], [place] [address], [place], [place] [address]  Basic metabolic panel     Status: Abnormal   Collection Time: 11/03/19  3:32 AM  Result Value Ref Range   Sodium 138 135 - 145 mmol/L   Potassium 4.0 3.5 - 5.1 mmol/L   Chloride 97 (L) 98 - 111 mmol/L   CO2  27 22 - 32 mmol/L   Glucose, Bld 136 (H) 70 - 99 mg/dL    Comment: Glucose reference range applies only to samples taken after fasting for at least 8 hours.   BUN 38 (H) 6 - 20 mg/dL   Creatinine, Ser 14.31 (H) 0.61 - 1.24 mg/dL   Calcium 8.3 (L) 8.9 - 10.3 mg/dL   GFR calc non Af Amer 4 (L) >60 mL/min   GFR calc Af Amer 4 (L) >60 mL/min   Anion gap 14 5 - 15    Comment: Performed at [facility] [address], [place], [place] [address]   DG Chest Port 1 View  Result Date: 11/02/2019 CLINICAL DATA:  Central line placement. EXAM: PORTABLE CHEST 1 VIEW COMPARISON:  March 14, 2019. FINDINGS: The heart size and  mediastinal contours are within normal limits. Both lungs are clear. Interval placement of left internal jugular catheter with distal tip in expected position of cavoatrial junction. No pneumothorax or pleural effusion is noted. The visualized skeletal structures are unremarkable. IMPRESSION: Interval placement of left internal jugular catheter with distal tip in expected position of cavoatrial junction. No pneumothorax is noted. Electronically Signed   By: Marijo Conception M.D.   On: 11/02/2019 15:27   DG Fluoro Guide CV Line Left  Result Date: 11/03/2019 CLINICAL DATA:  49 year old with end-stage renal disease. Placement of dialysis catheter by Dr. Ruta Hinds. EXAM: INTRAOPERATIVE FLUOROSCOPY CONTRAST:  See operative report FLUOROSCOPY TIME:  Fluoroscopy Time:  2 minutes and 6 seconds COMPARISON:  Chest radiograph 03/27/2019 FINDINGS: Venogram was performed in the left internal jugular vein. Mild narrowing near the junction of the left internal jugular vein and left innominate vein. Left innominate vein and SVC are patent. Left internal jugular dialysis catheter was placed. Tip of the catheter is not visualized. IMPRESSION: Placement of left internal jugular dialysis catheter. Catheter tip not visualized. Patent central veins. Electronically Signed   By: Markus Daft M.D.   On: 11/03/2019 08:10      Assessment/Plan ESRD s/p kidney transplant 2008 with failure 2018 Hx of B-cell lymphoma HTN T2DM  Abdominal pain Recurrent peritonitis with PD catheter - cx of peritoneal fluid from 10/28/19 grew pseudomonas - will plan for removal and discuss timing with MD.  He did eat breakfast this morning and has been restarted on his coumadin, but INR is currently 1.5. -to OR later today or tomorrow   Henreitta Cea, [facility] Surgery 11/03/2019, 9:22 AM Please see Amion for pager number during day hours 7:00am-4:30pm

## 2019-11-03 NOTE — TOC Progression Note (Addendum)
Transition of Care [facility]) - Progression Note    Patient Details  Name: James [name] MRN: [id number] Date of Birth: 11-26-1970  [facility] CM/SW Contact  Bartholomew Crews, RN Phone Number: [phone] 11/03/2019, 10:02 AM  Clinical Narrative:     Spoke with representative at [facility]. Able to confirm that patient currently has approximately 30 hours a month of PCS hours and his assigned home care agency is [facility]. Patient's services were initiated 09/16/2019, however, the agency submitted on 10/30/2019 a request on patient's behalf to increase his hours. Patient should receive a call from nurse at [facility] to complete assessment in order to process increased hours.   Spoke with DSS transportation about patient using Medicaid transportation for hemodialysis. He is currently active through September 2021, so will just need to schedule his transportation 3 days in advance.   Reached out to renal navigator. Clip referral received today. Will follow for HD outpatient home/schedule.   Update: Home Health referral for PT accepted by [facility]. HH order already provided.   TOC team following for transition needs.   Expected Discharge Plan: [place] Barriers to Discharge: Continued Medical Work up  Expected Discharge Plan and Services Expected Discharge Plan: [place] In-house Referral: Clinical Social Work Discharge Planning Services: CM Consult Post Acute Care Choice: [facility] arrangements for the past 2 months: Apartment                           HH Arranged: PT, OT           Social Determinants of Health (SDOH) Interventions    Readmission Risk Interventions No flowsheet data found.

## 2019-11-03 NOTE — Progress Notes (Signed)
Pt is on cipro for his SBP and PO vanc for c.diff. The treatment for SBP will extend beyond his c.dif treatement. D/w Dr. Tawanna Solo and we will extend his PO vanc 7d past the Cipro stop date.   Onnie Boer, PharmD, BCIDP, AAHIVP, CPP Infectious Disease Pharmacist 11/03/2019 1:40 PM

## 2019-11-03 NOTE — Anesthesia Postprocedure Evaluation (Signed)
Anesthesia Post Note  Patient: James [name]  Procedure([name]) Performed: INSERTION OF TUNNELED DIALYSIS CATHETER LEFT INTERNAL JUGULAR (Left ) ARTERIOVENOUS (AV) FISTULA CREATION RIGHT UPPER ARM (Right ) Central Venogram (N/A Chest) Right Cephalic Vein Transposition (Right )     Patient location during evaluation: PACU Anesthesia Type: General Level of consciousness: awake and alert Pain management: pain level controlled Vital Signs Assessment: post-procedure vital signs reviewed and stable Respiratory status: spontaneous breathing, nonlabored ventilation, respiratory function stable and patient connected to nasal cannula oxygen Cardiovascular status: blood pressure returned to baseline and stable Postop Assessment: no apparent nausea or vomiting Anesthetic complications: no    Last Vitals:  Vitals:   11/02/19 1601 11/03/19 0420  BP: (!) 170/65 (!) 172/82  Pulse: 89 93  Resp: 18 20  Temp:  36.9 C  SpO2: 100% 99%    Last Pain:  Vitals:   11/03/19 0420  TempSrc: Oral  PainSc:                  James [name]

## 2019-11-03 NOTE — Progress Notes (Signed)
PT Cancellation Note  Patient Details Name: James [name] MRN: [id number] DOB: 1971-07-09   Cancelled Treatment:    Reason Eval/Treat Not Completed: Patient at procedure or test/unavailable. Patient leaving for surgical procedure. Will follow up tomorrow.    Nakyia Dau 11/03/2019, 3:30 PM

## 2019-11-03 NOTE — Plan of Care (Signed)
  Problem: Activity: Goal: Risk for activity intolerance will decrease Outcome: Adequate for Discharge   Problem: Nutrition: Goal: Adequate nutrition will be maintained Outcome: Adequate for Discharge

## 2019-11-03 NOTE — Progress Notes (Signed)
Pt's surgery being cancelled today and moved to tomorrow. Report given to [facility], OR transport returning to floor.

## 2019-11-03 NOTE — Progress Notes (Addendum)
ANTICOAGULATION CONSULT NOTE - Follow Up Consult  Pharmacy Consult for Warfarin Indication: Splenic infarcts  Allergies  Allergen Reactions  . Tramadol Hcl Shortness Of Breath and Other (See Comments)    Tramadol HCl = Shortness of breath and Tramadol = Rash  . Nsaids Other (See Comments)    Was told by MD to not take these  . Darvon [Propoxyphene] Rash and Other (See Comments)    Broke out on eyelid   . Tramadol Rash and Other (See Comments)    Tramadol = Rash and Tramadol HCl = Shortness of breath    Patient Measurements: Height: 5\' 6"  (167.6 cm) Weight: 65.8 kg (145 lb 1 oz) IBW/kg (Calculated) : 63.8  Vital Signs: Temp: 98.4 F (36.9 C) (04/13 0420) Temp Source: Oral (04/13 0420) BP: 142/42 (04/13 0830) Pulse Rate: 94 (04/13 0830)  Labs: Recent Labs    11/01/19 0533 11/01/19 0533 11/02/19 0508 11/03/19 0332  HGB 8.6*   < > 9.2* 8.2*  HCT 24.7*  --  27.1* 23.9*  PLT 85*  --  91* 64*  LABPROT 15.3*  --  15.6* 18.1*  INR 1.2  --  1.3* 1.5*  CREATININE 11.49*  --  11.96* 14.31*   < > = values in this interval not displayed.    Estimated Creatinine Clearance: 5.8 mL/min (A) (by C-G formula based on SCr of 14.31 mg/dL (H)).   Assessment: 49 YOM on Warfarin PTA for hx splenic infarcts held on admission due to low Hgb. No INR taken on admit. PTA dose listed as 5 mg daily EXCEPT for 7.5 mg on Tues/Thurs. Pharmacy consulted to resume Warfarin dosing 4/10.  INR today remains SUBtherapeutic and uptrending (1.3>>1.5, goal of 2-3) despite no dose being given yesterday. Hgb 8.2, plts 64 - will watch. Will decrease the warfarin dose this evening. No bleeding noted at this time.  Goal of Therapy:  INR 2-3 Monitor platelets by anticoagulation protocol: Yes   Plan:  - Warfarin 5 mg x 1 dose today - Daily PT/INR, CBC q72h - Will continue to monitor for any signs/symptoms of bleeding and will follow up with PT/INR in the a.m.    Arieana Somoza A. Levada Dy, PharmD, BCPS,  FNKF Clinical Pharmacist [place] Please utilize Amion for appropriate phone number to reach the unit pharmacist ([place])  Addendum:  Patient going to surgery for removal of PD catheter. Will hold warfarin at this time.   Aminata Buffalo A. Levada Dy, PharmD, BCPS, FNKF Clinical Pharmacist [place] Please utilize Amion for appropriate phone number to reach the unit pharmacist ([place])

## 2019-11-03 NOTE — Progress Notes (Signed)
PHARMACY NOTE:  ANTIMICROBIAL RENAL DOSAGE ADJUSTMENT  Current antimicrobial regimen includes a mismatch between antimicrobial dosage and estimated renal function.  As per policy approved by the Pharmacy & Therapeutics and Medical Executive Committees, the antimicrobial dosage will be adjusted accordingly.  Current antimicrobial dosage:  Cipro 500mg  PO BID  Indication: peritonitis  Renal Function:  Estimated Creatinine Clearance: 5.8 mL/min (A) (by C-G formula based on SCr of 14.31 mg/dL (H)). [x]      On intermittent HD, scheduled: []      On CRRT    Antimicrobial dosage has been changed to:  Cipro 500mg  PO daily  Additional comments:   James [name] A. Levada Dy, PharmD, BCPS, FNKF Clinical Pharmacist [place] Please utilize Amion for appropriate phone number to reach the unit pharmacist ([place])   11/03/2019 11:42 AM

## 2019-11-03 NOTE — Procedures (Signed)
I was present at this dialysis session. I have reviewed the session itself and made appropriate changes.   Vital signs in last 24 hours:  Temp:  [97.6 F (36.4 C)-98.4 F (36.9 C)] 98.4 F (36.9 C) (04/13 0420) Pulse Rate:  [89-113] 94 (04/13 0830) Resp:  [14-20] 20 (04/13 0420) BP: (142-182)/(42-88) 142/42 (04/13 0830) SpO2:  [99 %-100 %] 99 % (04/13 0420) Weight change: -4 kg Filed Weights   11/01/19 1845 11/01/19 2051 11/02/19 0820  Weight: 69.8 kg 69.5 kg 65.8 kg    Recent Labs  Lab 11/02/19 0508 11/02/19 0508 11/03/19 0332  NA 135   < > 138  K 3.3*   < > 4.0  CL 93*   < > 97*  CO2 28   < > 27  GLUCOSE 118*   < > 136*  BUN 28*   < > 38*  CREATININE 11.96*   < > 14.31*  CALCIUM 8.4*   < > 8.3*  PHOS 5.0*  --   --    < > = values in this interval not displayed.    Recent Labs  Lab 11/01/19 0533 11/02/19 0508 11/03/19 0332  WBC 1.5* 1.9* 2.1*  NEUTROABS 0.6* 0.9* 1.5*  HGB 8.6* 9.2* 8.2*  HCT 24.7* 27.1* 23.9*  MCV 97.2 99.6 99.2  PLT 85* 91* 64*    Scheduled Meds: . sodium chloride   Intravenous Once  . amLODipine  10 mg Oral Daily  . calcitRIOL  0.75 mcg Oral Daily  . Chlorhexidine Gluconate Cloth  6 each Topical Q0600  . darbepoetin (ARANESP) injection - NON-DIALYSIS  150 mcg Subcutaneous Q Thu-1800  . doxazosin  4 mg Oral QHS  . ferrous sulfate  325 mg Oral BID WC  . folic acid  1 mg Oral Daily  . gentamicin cream  1 application Topical Daily  . insulin aspart  0-5 Units Subcutaneous QHS  . insulin aspart  0-6 Units Subcutaneous TID WC  . lisinopril  40 mg Oral Daily  . metoprolol tartrate  100 mg Oral BID  . valGANciclovir  450 mg Oral Q M,W,F  . vancomycin  125 mg Oral TID AC & HS  . Warfarin - Pharmacist Dosing Inpatient   Does not apply q1600   Continuous Infusions: . sodium chloride    . sodium chloride    . sodium chloride 10 mL/hr at 11/02/19 1038  . cefTAZidime (FORTAZ)  IV 1 g (11/02/19 0955)   PRN Meds:.sodium chloride, sodium  chloride, acetaminophen **OR** acetaminophen, alteplase, calcium carbonate (dosed in mg elemental calcium), camphor-menthol **AND** hydrOXYzine, docusate sodium, feeding supplement (NEPRO CARB STEADY), heparin, HYDROmorphone (DILAUDID) injection, lidocaine (PF), lidocaine-prilocaine, ondansetron **OR** ondansetron (ZOFRAN) IV, oxyCODONE-acetaminophen **AND** oxyCODONE, pentafluoroprop-tetrafluoroeth, sorbitol, zolpidem     Assessment/Plan:  1. Abdominal pain and recurrent SBP: CT unrevealing.PD fluid with 4100 TNCs> 849 (4/07). PD fluid cx few pseudomonas fluroescens 04/11/2021Per Phar consult now on fortaz. Prior peritonitis 03/2019 was staph hemolyticus.Started ceftazidime today. Awaiting sensitivities.  2. ESRDon CCPD.H/O recurrent peritonitis. Says he has had it "enough". No compelling HD needs for today. Will start T,Th, S schedule after access placed by [facility] 11/02/19. He wishes to return to [facility] and hoping to get into [facility] which is closest to his home. CLIP coordinator notified.  3. HTN/volume-No evidence of volume overload by exam. BP still elevated, continue Home Meds. (Lisinopril 10 mg q day, Metoprolol 100 mg PO bid, started amlodipine 5mg  PO q HS.) Minimal UF with 1st HD tomorrow.  4. ABLA/Anemia CKD -  HGB 8.2 today. HGB 6.7 on adm. S/P 2 units PRBCS. FOBT+ HO C Diff  Resume ESA here.Aranesp 164mcg subcut q tues ( given 4/08 ) 5. MBD of CKD -Continue home binders/calcitriol when eating. RFP added to today's labs . 6. HO C Diff = po vanc per primary 7. Nutrition.lastAlb 2.5 ,refused Nepro,Prot supp  8. H/oB cell lymphomaWith Pancytopenia - in Remission /He follows with hematology/oncology Dr. Marcelene Butte. 9. Hx failed KTx off meds 10. 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 Has been resumed, pharmacy managing. 11. Vascular access:  S/p LIJ TDC and RUE AVF faint thrill, +bruit.  PD catheter needs to be removed.  Will ask [facility] if they will do or if he needs to  f/u with Dr. Raul Del 12. Disposition: awaiting outpatient HD to be arranged before discharge.   Donetta Potts,  MD 11/03/2019, 8:46 AM

## 2019-11-03 NOTE — TOC Progression Note (Signed)
Transition of Care [facility]) - Progression Note    Patient Details  Name: James [name] MRN: [id number] Date of Birth: 1970-10-21  [facility]) CM/SW Contact  Bartholomew Crews, RN Phone Number: [phone] 11/03/2019, 8:59 AM  Clinical Narrative:     Notified by [facility] that they are unable to accept [facility] referral for patient at this time. TOC team following for transition needs,  Expected Discharge Plan: [place] Barriers to Discharge: Continued Medical Work up  Expected Discharge Plan and Services Expected Discharge Plan: [place] In-house Referral: Clinical Social Work Discharge Planning Services: CM Consult Post Acute Care Choice: [place] arrangements for the past 2 months: Apartment                           HH Arranged: PT, OT           Social Determinants of Health (SDOH) Interventions    Readmission Risk Interventions No flowsheet data found.

## 2019-11-03 NOTE — Progress Notes (Addendum)
  Postoperative hemodialysis access     Date of Surgery:  11/02/2019 Surgeon: Oneida Alar  Subjective:  No complaints; denies any hand pain  PHYSICAL EXAMINATION:  Vitals:   11/02/19 1601 11/03/19 0420  BP: (!) 170/65 (!) 172/82  Pulse: 89 93  Resp: 18 20  Temp:  98.4 F (36.9 C)  SpO2: 100% 99%    Incision is clean and dry Sensation in digits is intact;  There is a faint Thrill  There is bruit. The graft/fistula is faintly palpable  Right Radial pulse is not palpable   ASSESSMENT/PLAN:  James [name] is a 49 y.o. year old male who is s/p  Right cephalic vein transposition, TDC placement and venogram  -graft/fistula is patent with faintly palpable thrill; bruit is present -pt does not have evidence of steal sx -f/u with Dr. Oneida Alar in 4-6 weeks to check maturation of AVF -will sign off-call as needed.   Leontine Locket, PA-C [facility] [phone]  Agree with above  Ruta Hinds, MD [facility] [place] Office: [phone]

## 2019-11-03 NOTE — Progress Notes (Signed)
PT Cancellation Note  Patient Details Name: James [name] MRN: [id number] DOB: June 19, 1971   Cancelled Treatment:    Reason Eval/Treat Not Completed: Patient at procedure or test/unavailable. Patient at HD. Will try back at another time/day.    [name] 11/03/2019, 10:57 AM

## 2019-11-04 ENCOUNTER — Encounter (HOSPITAL_COMMUNITY)
Admission: EM | Disposition: A | Discharge status: Home / Self Care | Payer: Self-pay | Source: Home / Self Care | Attending: Internal Medicine

## 2019-11-04 ENCOUNTER — Inpatient Hospital Stay (HOSPITAL_COMMUNITY): Payer: Medicare Other | Admitting: Certified Registered"

## 2019-11-04 ENCOUNTER — Encounter (HOSPITAL_COMMUNITY): Payer: Self-pay | Admitting: Internal Medicine

## 2019-11-04 DIAGNOSIS — E119 Type 2 diabetes mellitus without complications: Secondary | ICD-10-CM | POA: Diagnosis not present

## 2019-11-04 DIAGNOSIS — K652 Spontaneous bacterial peritonitis: Secondary | ICD-10-CM | POA: Diagnosis not present

## 2019-11-04 DIAGNOSIS — N186 End stage renal disease: Secondary | ICD-10-CM

## 2019-11-04 DIAGNOSIS — Z940 Kidney transplant status: Secondary | ICD-10-CM

## 2019-11-04 DIAGNOSIS — Z992 Dependence on renal dialysis: Secondary | ICD-10-CM

## 2019-11-04 DIAGNOSIS — I10 Essential (primary) hypertension: Secondary | ICD-10-CM

## 2019-11-04 HISTORY — PX: CAPD REMOVAL: SHX5234

## 2019-11-04 LAB — RENAL FUNCTION PANEL
Albumin: 1.7 g/dL — ABNORMAL LOW (ref 3.5–5.0)
Anion gap: 9 (ref 5–15)
BUN: 20 mg/dL (ref 6–20)
CO2: 25 mmol/L (ref 22–32)
Calcium: 8.7 mg/dL — ABNORMAL LOW (ref 8.9–10.3)
Chloride: 104 mmol/L (ref 98–111)
Creatinine, Ser: 8.89 mg/dL — ABNORMAL HIGH (ref 0.61–1.24)
GFR calc Af Amer: 7 mL/min — ABNORMAL LOW (ref >60)
GFR calc non Af Amer: 6 mL/min — ABNORMAL LOW (ref >60)
Glucose, Bld: 84 mg/dL (ref 70–99)
Phosphorus: 4.6 mg/dL (ref 2.5–4.6)
Potassium: 3.9 mmol/L (ref 3.5–5.1)
Sodium: 138 mmol/L (ref 135–145)

## 2019-11-04 LAB — CBC WITH DIFFERENTIAL/PLATELET
Abs Immature Granulocytes: 0.03 10*3/uL (ref 0.00–0.07)
Basophils Absolute: 0 10*3/uL (ref 0.0–0.1)
Basophils Relative: 0 %
Eosinophils Absolute: 0 10*3/uL (ref 0.0–0.5)
Eosinophils Relative: 1 %
HCT: 22.3 % — ABNORMAL LOW (ref 39.0–52.0)
Hemoglobin: 7.4 g/dL — ABNORMAL LOW (ref 13.0–17.0)
Immature Granulocytes: 2 %
Lymphocytes Relative: 33 %
Lymphs Abs: 0.7 10*3/uL (ref 0.7–4.0)
MCH: 34.3 pg — ABNORMAL HIGH (ref 26.0–34.0)
MCHC: 33.2 g/dL (ref 30.0–36.0)
MCV: 103.2 fL — ABNORMAL HIGH (ref 80.0–100.0)
Monocytes Absolute: 0.2 10*3/uL (ref 0.1–1.0)
Monocytes Relative: 12 %
Neutro Abs: 1.1 10*3/uL — ABNORMAL LOW (ref 1.7–7.7)
Neutrophils Relative %: 52 %
Platelets: 62 10*3/uL — ABNORMAL LOW (ref 150–400)
RBC: 2.16 MIL/uL — ABNORMAL LOW (ref 4.22–5.81)
RDW: 17.6 % — ABNORMAL HIGH (ref 11.5–15.5)
WBC: 2.1 10*3/uL — ABNORMAL LOW (ref 4.0–10.5)
nRBC: 0 % (ref 0.0–0.2)

## 2019-11-04 LAB — GLUCOSE, CAPILLARY
Glucose-Capillary: 72 mg/dL (ref 70–99)
Glucose-Capillary: 81 mg/dL (ref 70–99)
Glucose-Capillary: 79 mg/dL (ref 70–99)

## 2019-11-04 LAB — TYPE AND SCREEN
ABO/RH(D): O POS
Antibody Screen: NEGATIVE

## 2019-11-04 LAB — PROTIME-INR
INR: 1.3 — ABNORMAL HIGH (ref 0.8–1.2)
Prothrombin Time: 16.2 seconds — ABNORMAL HIGH (ref 11.4–15.2)

## 2019-11-04 SURGERY — CONTINUOUS AMBULATORY PERITONEAL DIALYSIS  (CAPD) CATHETER REMOVAL
Anesthesia: General | Site: Abdomen | Laterality: Left

## 2019-11-04 MED ORDER — MIDAZOLAM HCL 5 MG/5ML IJ SOLN
INTRAMUSCULAR | Status: DC | PRN
  Administered 2019-11-04: 2 mg via INTRAVENOUS

## 2019-11-04 MED ORDER — FENTANYL CITRATE (PF) 250 MCG/5ML IJ SOLN
INTRAMUSCULAR | Status: AC
  Filled 2019-11-04: qty 5

## 2019-11-04 MED ORDER — CIPROFLOXACIN HCL 500 MG PO TABS: 500.0000 mg | ORAL_TABLET | Freq: Every day | ORAL | 0 refills | Status: AC

## 2019-11-04 MED ORDER — HYDROMORPHONE HCL 1 MG/ML IJ SOLN: 0.2500 mg | INTRAMUSCULAR | Status: DC | PRN

## 2019-11-04 MED ORDER — MIDAZOLAM HCL 2 MG/2ML IJ SOLN
INTRAMUSCULAR | Status: AC
  Filled 2019-11-04: qty 2

## 2019-11-04 MED ORDER — LIDOCAINE HCL 1 % IJ SOLN
INTRAMUSCULAR | Status: AC
  Filled 2019-11-04: qty 20

## 2019-11-04 MED ORDER — PHENYLEPHRINE 40 MCG/ML (10ML) SYRINGE FOR IV PUSH (FOR BLOOD PRESSURE SUPPORT)
PREFILLED_SYRINGE | INTRAVENOUS | Status: AC
  Filled 2019-11-04: qty 10

## 2019-11-04 MED ORDER — CEFAZOLIN SODIUM-DEXTROSE 2-3 GM-%(50ML) IV SOLR
INTRAVENOUS | Status: DC | PRN
  Administered 2019-11-04: 2 g via INTRAVENOUS

## 2019-11-04 MED ORDER — LISINOPRIL 40 MG PO TABS: 40.0000 mg | ORAL_TABLET | Freq: Every day | ORAL | 0 refills | Status: DC

## 2019-11-04 MED ORDER — OXYCODONE HCL 5 MG/5ML PO SOLN: 5.0000 mg | Freq: Once | ORAL | Status: DC | PRN

## 2019-11-04 MED ORDER — DEXAMETHASONE SODIUM PHOSPHATE 10 MG/ML IJ SOLN
INTRAMUSCULAR | Status: DC | PRN
  Administered 2019-11-04: 5 mg via INTRAVENOUS

## 2019-11-04 MED ORDER — AMLODIPINE BESYLATE 10 MG PO TABS: 10.0000 mg | ORAL_TABLET | Freq: Every day | ORAL | 0 refills | Status: AC

## 2019-11-04 MED ORDER — BUPIVACAINE-EPINEPHRINE 0.25% -1:200000 IJ SOLN
INTRAMUSCULAR | Status: DC | PRN
  Administered 2019-11-04: 3 mL

## 2019-11-04 MED ORDER — SODIUM CHLORIDE 0.9 % IV SOLN: INTRAVENOUS | Status: DC

## 2019-11-04 MED ORDER — DEXAMETHASONE SODIUM PHOSPHATE 10 MG/ML IJ SOLN
INTRAMUSCULAR | Status: AC
  Filled 2019-11-04: qty 1

## 2019-11-04 MED ORDER — 0.9 % SODIUM CHLORIDE (POUR BTL) OPTIME
TOPICAL | Status: DC | PRN
  Administered 2019-11-04: 1000 mL

## 2019-11-04 MED ORDER — PROMETHAZINE HCL 25 MG/ML IJ SOLN: 6.2500 mg | INTRAMUSCULAR | Status: DC | PRN

## 2019-11-04 MED ORDER — BUPIVACAINE HCL (PF) 0.25 % IJ SOLN
INTRAMUSCULAR | Status: AC
  Filled 2019-11-04: qty 30

## 2019-11-04 MED ORDER — OXYCODONE HCL 5 MG PO TABS: 5.0000 mg | ORAL_TABLET | Freq: Once | ORAL | Status: DC | PRN

## 2019-11-04 MED ORDER — DARBEPOETIN ALFA 200 MCG/0.4ML IJ SOSY: 200.0000 ug | PREFILLED_SYRINGE | INTRAMUSCULAR | Status: DC

## 2019-11-04 MED ORDER — FENTANYL CITRATE (PF) 100 MCG/2ML IJ SOLN
INTRAMUSCULAR | Status: DC | PRN
  Administered 2019-11-04 (×5): 50 ug via INTRAVENOUS

## 2019-11-04 MED ORDER — VANCOMYCIN HCL 125 MG PO CAPS: 125.0000 mg | ORAL_CAPSULE | Freq: Four times a day (QID) | ORAL | 0 refills | Status: AC

## 2019-11-04 MED ORDER — ONDANSETRON HCL 4 MG/2ML IJ SOLN
INTRAMUSCULAR | Status: DC | PRN
  Administered 2019-11-04: 4 mg via INTRAVENOUS

## 2019-11-04 MED ORDER — CEFAZOLIN SODIUM 1 G IJ SOLR
INTRAMUSCULAR | Status: AC
  Filled 2019-11-04: qty 20

## 2019-11-04 MED ORDER — PROPOFOL 10 MG/ML IV BOLUS
INTRAVENOUS | Status: DC | PRN
  Administered 2019-11-04: 140 mg via INTRAVENOUS

## 2019-11-04 MED ORDER — PHENYLEPHRINE 40 MCG/ML (10ML) SYRINGE FOR IV PUSH (FOR BLOOD PRESSURE SUPPORT)
PREFILLED_SYRINGE | INTRAVENOUS | Status: DC | PRN
  Administered 2019-11-04 (×7): 80 ug via INTRAVENOUS

## 2019-11-04 MED ORDER — LIDOCAINE 2% (20 MG/ML) 5 ML SYRINGE
INTRAMUSCULAR | Status: DC | PRN
  Administered 2019-11-04: 40 mg via INTRAVENOUS

## 2019-11-04 MED ORDER — ONDANSETRON HCL 4 MG/2ML IJ SOLN
INTRAMUSCULAR | Status: AC
  Filled 2019-11-04: qty 2

## 2019-11-04 MED ORDER — PROPOFOL 10 MG/ML IV BOLUS
INTRAVENOUS | Status: AC
  Filled 2019-11-04: qty 20

## 2019-11-04 MED FILL — CIPROFLOXACIN HCL 500 MG TA: 500 | 10 days supply | Qty: 10 | Fill #0

## 2019-11-04 MED FILL — LISINOPRIL 40 MG TABS: 40 | 30 days supply | Qty: 30 | Fill #0

## 2019-11-04 MED FILL — AMLODIPINE BESYLATE 10 MG T: 10 | 30 days supply | Qty: 30 | Fill #0

## 2019-11-04 MED FILL — VANCOMYCIN HCL 125 MG CAP: 125 | 16 days supply | Qty: 64 | Fill #0

## 2019-11-04 SURGICAL SUPPLY — 28 items
ADH SKN CLS APL DERMABOND .7 (GAUZE/BANDAGES/DRESSINGS) ×1
BLADE CLIPPER SURG (BLADE) IMPLANT
CANISTER SUCT 3000ML PPV (MISCELLANEOUS) IMPLANT
COVER SURGICAL LIGHT HANDLE (MISCELLANEOUS) ×2 IMPLANT
COVER WAND RF STERILE (DRAPES) ×2 IMPLANT
DERMABOND ADVANCED (GAUZE/BANDAGES/DRESSINGS) ×1
DERMABOND ADVANCED .7 DNX12 (GAUZE/BANDAGES/DRESSINGS) ×1 IMPLANT
DRAPE LAPAROTOMY T 102X78X121 (DRAPES) ×2 IMPLANT
ELECT REM PT RETURN 9FT ADLT (ELECTROSURGICAL) ×2
ELECTRODE REM PT RTRN 9FT ADLT (ELECTROSURGICAL) ×1 IMPLANT
GAUZE 4X4 16PLY RFD (DISPOSABLE) ×2 IMPLANT
GLOVE BIO SURGEON STRL SZ7.5 (GLOVE) ×2 IMPLANT
GOWN STRL REUS W/ TWL LRG LVL3 (GOWN DISPOSABLE) ×1 IMPLANT
GOWN STRL REUS W/ TWL XL LVL3 (GOWN DISPOSABLE) ×1 IMPLANT
GOWN STRL REUS W/TWL LRG LVL3 (GOWN DISPOSABLE) ×2
GOWN STRL REUS W/TWL XL LVL3 (GOWN DISPOSABLE) ×2
KIT BASIN OR (CUSTOM PROCEDURE TRAY) ×2 IMPLANT
KIT TURNOVER KIT B (KITS) ×2 IMPLANT
NEEDLE HYPO 25GX1X1/2 BEV (NEEDLE) ×2 IMPLANT
NS IRRIG 1000ML POUR BTL (IV SOLUTION) ×2 IMPLANT
PACK GENERAL/GYN (CUSTOM PROCEDURE TRAY) ×2 IMPLANT
PAD ARMBOARD 7.5X6 YLW CONV (MISCELLANEOUS) ×2 IMPLANT
PENCIL SMOKE EVACUATOR (MISCELLANEOUS) ×2 IMPLANT
SUT MNCRL AB 4-0 PS2 18 (SUTURE) ×2 IMPLANT
SUT VIC AB 3-0 SH 18 (SUTURE) IMPLANT
SYR CONTROL 10ML LL (SYRINGE) ×2 IMPLANT
TOWEL GREEN STERILE (TOWEL DISPOSABLE) ×2 IMPLANT
TOWEL GREEN STERILE FF (TOWEL DISPOSABLE) ×2 IMPLANT

## 2019-11-04 NOTE — Anesthesia Procedure Notes (Signed)
Procedure Name: LMA Insertion Date/Time: 11/04/2019 1:12 PM Performed by: Gwyndolyn Saxon, CRNA Pre-anesthesia Checklist: Patient identified, Emergency Drugs available, Suction available and Patient being monitored Patient Re-evaluated:Patient Re-evaluated prior to induction Oxygen Delivery Method: Circle system utilized Preoxygenation: Pre-oxygenation with 100% oxygen Induction Type: IV induction Ventilation: Mask ventilation without difficulty LMA: LMA inserted LMA Size: 4.0 Number of attempts: 1 Placement Confirmation: positive ETCO2 and breath sounds checked- equal and bilateral Tube secured with: Tape Dental Injury: Teeth and Oropharynx as per pre-operative assessment

## 2019-11-04 NOTE — Progress Notes (Signed)
PT Cancellation Note  Patient Details Name: James [name] MRN: [id number] DOB: 1971-03-07   Cancelled Treatment:    Reason Eval/Treat Not Completed: Patient at procedure or test/unavailable. PT will continue to f/u with pt acutely as available and appropriate.    [place] 11/04/2019, 1:02 PM

## 2019-11-04 NOTE — Progress Notes (Addendum)
[facility] Progress Note   Subjective: Seen in room, pleasant and cooperative however has been threatening to leave AMA, called nursing station, said he wanted to leave and "no one can stop me". Discussed need to stay until discharge plan in place.   Objective Vitals:   11/04/19 0525 11/04/19 0527 11/04/19 0955 11/04/19 1218  BP: (!) 157/66 (!) 168/72 (!) 154/47   Pulse: 99 92 93   Resp: 20 20 18    Temp: 98.7 F (37.1 C) 98.1 F (36.7 C) 98.3 F (36.8 C)   TempSrc:  Oral Oral   SpO2: 99% 98% 99%   Weight:    66.4 kg  Height:    5' 5.98" (1.676 m)   Physical Exam General: Pleasant chronically ill appearing male in NAD Heart:S1,S2 RRR No M/R/G Lungs: CTAB Abdomen: Guarding, still tender LUA. PD catheter drsg CDI. 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 LIJ TDC, R AVF + bruit    Additional Objective Labs: Basic Metabolic Panel: Recent Labs  Lab 10/30/19 0536 10/31/19 0810 11/02/19 0508 11/03/19 0332 11/04/19 0436  NA 135   < > 135 138 138  K 4.0   < > 3.3* 4.0 3.9  CL 94*   < > 93* 97* 104  CO2 26   < > 28 27 25   GLUCOSE 82   < > 118* 136* 84  BUN 38*   < > 28* 38* 20  CREATININE 13.29*   < > 11.96* 14.31* 8.89*  CALCIUM 8.5*   < > 8.4* 8.3* 8.7*  PHOS 5.3*  --  5.0*  --  4.6   < > = values in this interval not displayed.   Liver Function Tests: Recent Labs  Lab 11/02/19 0508 11/04/19 0436  ALBUMIN 1.8* 1.7*   No results for input(s): LIPASE, AMYLASE in the last 168 hours. CBC: Recent Labs  Lab 10/31/19 0810 10/31/19 1839 11/01/19 0533 11/01/19 0533 11/02/19 0508 11/03/19 0332 11/04/19 0436  WBC 1.7*  --  1.5*   < > 1.9* 2.1* 2.1*  NEUTROABS 0.7*  --  0.6*   < > 0.9* 1.5* 1.1*  HGB 6.7*   < > 8.6*   < > 9.2* 8.2* 7.4*  HCT 19.4*   < > 24.7*   < > 27.1* 23.9* 22.3*  MCV 100.5*  --  97.2  --  99.6 99.2 103.2*  PLT 92*  --  85*   < > 91* 64* 62*   < > = values in this  interval not displayed.   Blood Culture    Component Value Date/Time   SDES BLOOD RIGHT FINGER 10/29/2019 0047   SPECREQUEST  10/29/2019 0047    BOTTLES DRAWN AEROBIC ONLY Blood Culture adequate volume   CULT  10/29/2019 0047    NO GROWTH 5 DAYS Performed at [facility], [place] [address] [place], [place] [address]    REPTSTATUS 11/03/2019 FINAL 10/29/2019 0047    Cardiac Enzymes: No results for input(s): CKTOTAL, CKMB, CKMBINDEX, TROPONINI in the last 168 hours. CBG: Recent Labs  Lab 11/03/19 1123 11/03/19 1618 11/03/19 2155 11/04/19 0721 11/04/19 1135  GLUCAP 160* 85 103* 72 81   Iron Studies: No results for input(s): IRON, TIBC, TRANSFERRIN, FERRITIN in the last 72 hours. @lablastinr3 @ Studies/Results: DG Chest Port 1 View  Result Date: 11/02/2019 CLINICAL DATA:  Central line placement. EXAM: PORTABLE CHEST 1 VIEW COMPARISON:  March 14, 2019. FINDINGS: The heart  size and mediastinal contours are within normal limits. Both lungs are clear. Interval placement of left internal jugular catheter with distal tip in expected position of cavoatrial junction. No pneumothorax or pleural effusion is noted. The visualized skeletal structures are unremarkable. IMPRESSION: Interval placement of left internal jugular catheter with distal tip in expected position of cavoatrial junction. No pneumothorax is noted. Electronically Signed   By: Marijo Conception M.D.   On: 11/02/2019 15:27   Medications: . sodium chloride 10 mL/hr at 11/02/19 1038  . sodium chloride     . [MAR Hold] sodium chloride   Intravenous Once  . [MAR Hold] amLODipine  10 mg Oral Daily  . [MAR Hold] calcitRIOL  0.75 mcg Oral Daily  . [MAR Hold] Chlorhexidine Gluconate Cloth  6 each Topical Q0600  . [MAR Hold] ciprofloxacin  500 mg Oral Daily  . [MAR Hold] darbepoetin (ARANESP) injection - NON-DIALYSIS  150 mcg Subcutaneous Q Thu-1800  . [MAR Hold] doxazosin  4 mg Oral QHS  . [MAR Hold] ferrous sulfate  325 mg  Oral BID WC  . [MAR Hold] folic acid  1 mg Oral Daily  . [MAR Hold] gentamicin cream  1 application Topical Daily  . [MAR Hold] insulin aspart  0-5 Units Subcutaneous QHS  . [MAR Hold] insulin aspart  0-6 Units Subcutaneous TID WC  . [MAR Hold] lisinopril  40 mg Oral Daily  . [MAR Hold] metoprolol tartrate  100 mg Oral BID  . [MAR Hold] valGANciclovir  450 mg Oral Q M,W,F  . [MAR Hold] vancomycin  125 mg Oral TID AC & HS  . [MAR Hold] Warfarin - Pharmacist Dosing Inpatient   Does not apply q1600     Dialysis Orders: [facility] CCPD 9H 5 cycles 2.3L Last fill midday 1.5L  Assessment/Plan:  1.Abdominal pain/Likely SBP: CT unrevealing.PD fluid with 4100 TNCs> 849 (4/07). PD fluid cx few pseudomonas fluroescens 04/11/2021Per Phar consult . Prior peritonitis 03/2019 was staph hemolyticus.Started ceftazidime today. Cefepime and Vanc DC'd.  2. ESRDon CCPD.H/O recurrent peritonitis. Says he has had it "enough". No compelling HD needs for today. Has decided to remain with Dr. Joesph July and has been clipped to [facility] starting tomorrow.  3. HTN/volume-No evidence of volume overload by exam. BP still elevated, continue Home Meds. (Lisinopril 10 mg q day, Metoprolol 100 mg PO bid, started amlodipine 5mg  PO q HS.) Minimal UF with 1st HD tomorrow.  4. ABLA/Anemia CKD -HGB down trending  7.4 today. Increase Aranesp to 200 mcg IV weekly start tomorrow. HGB 6.7 on adm. S/P 2 units PRBCS. FOBT+ HO C Diff.  5.MBD of CKD -Continue home binders/calcitriol when eating. RFP added to today's labs . 6. HO C Diff = po vanc per primary 6.Nutrition.lastAlb 2.5 ,refused Nepro,Prot supp  7. H/oB cell lymphomaWith Pancytopenia - in Remission /He follows with hematology/oncology Dr. Marcelene Butte. 8. Hx failed KTx still on meds 9. 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 Has been resumed, pharmacy managing.   Disposition: Home after removal of PD catheter if stable.   Rita  H. Brown NP-C 11/04/2019, 12:18 PM  [facility] [phone]  I have seen and examined this patient and agree with plan and assessment in the above note with renal recommendations/intervention highlighted.  Pt wants to go home today and will follow up with outpatient HD at [facility] tomorrow.   Ok to resume transplant meds and continue taper as an outpatient.  Governor Rooks Armen Waring,MD 11/04/2019 3:34 PM

## 2019-11-04 NOTE — Progress Notes (Signed)
DISCHARGE NOTE HOME James [name] Saner to be discharged Home per MD order. Discussed prescriptions and follow up appointments with the patient. Prescriptions given to patient; medication list explained in detail. Patient verbalized understanding.  Skin clean, dry and intact without evidence of skin break down, no evidence of skin tears noted. IV catheter discontinued intact. Site without signs and symptoms of complications. Dressing and pressure applied. Pt denies pain at the site currently. No complaints noted.  Patient free of lines, drains, and wounds.   An After Visit Summary (AVS) was printed and given to the patient. Patient escorted via wheelchair, and discharged home via private auto.  Dorthey Sawyer, RN

## 2019-11-04 NOTE — TOC Transition Note (Signed)
Transition of Care [facility]) - CM/SW Discharge Note   Patient Details  Name: James [name] MRN: [id number] Date of Birth: Jan 24, 1971  Transition of Care [facility]) CM/SW Contact:  Verdell Carmine, RN Phone Number: 11/04/2019, 3:52 PM   Clinical Narrative:    Spoke to patient regarding discharge home. We have discharge transportation, but concern regarding when he gets home. He made some calls and someone will be there to receive him. Physician will send discharge  medications to transition of Care pharmacy, made sure they had oral vancomycin.  Called transport to see if they could get a door to door transport as well.. Ensured all patient information, address, phone was correct.  Will  have [facility], patient agreeable to this change.     Barriers to Discharge: Continued Medical Work up None  Patient Goals and CMS Choice Patient states their goals for this hospitalization and ongoing recovery are:: return home CMS Medicare.gov Compare Post Acute Care list provided to:: Patient Choice offered to / list presented to : Patient  Discharge Placement  Home with home health                     Discharge Plan and Services In-house Referral: Clinical Social Work Discharge Planning Services: CM Consult Post Acute Care Choice: Home Health                    HH Arranged: PT, OT          Social Determinants of Health (SDOH) Interventions     Readmission Risk Interventions No flowsheet data found.

## 2019-11-04 NOTE — Progress Notes (Signed)
ANTICOAGULATION CONSULT NOTE - Follow Up Consult  Pharmacy Consult for Warfarin Indication: Splenic infarcts  Allergies  Allergen Reactions  . Tramadol Hcl Shortness Of Breath and Other (See Comments)    Tramadol HCl = Shortness of breath and Tramadol = Rash  . Nsaids Other (See Comments)    Was told by MD to not take these  . Darvon [Propoxyphene] Rash and Other (See Comments)    Broke out on eyelid   . Tramadol Rash and Other (See Comments)    Tramadol = Rash and Tramadol HCl = Shortness of breath    Patient Measurements: Height: 5\' 6"  (167.6 cm) Weight: 66.4 kg (146 lb 6.2 oz) IBW/kg (Calculated) : 63.8  Vital Signs: Temp: 98.1 F (36.7 C) (04/14 0527) Temp Source: Oral (04/14 0527) BP: 168/72 (04/14 0527) Pulse Rate: 92 (04/14 0527)  Labs: Recent Labs    11/02/19 0508 11/02/19 0508 11/03/19 0332 11/04/19 0436  HGB 9.2*   < > 8.2* 7.4*  HCT 27.1*  --  23.9* 22.3*  PLT 91*  --  64* 62*  LABPROT 15.6*  --  18.1* 16.2*  INR 1.3*  --  1.5* 1.3*  CREATININE 11.96*  --  14.31* 8.89*   < > = values in this interval not displayed.    Estimated Creatinine Clearance: 9.4 mL/min (A) (by C-G formula based on SCr of 8.89 mg/dL (H)).   Assessment: 40 YOM on Warfarin PTA for hx splenic infarcts held on admission due to low Hgb. No INR taken on admit. PTA dose listed as 5 mg daily EXCEPT for 7.5 mg on Tues/Thurs. Pharmacy consulted to resume Warfarin dosing 4/10.  INR today remains SUBtherapeutic (1.3, goal of 2-3) no doses given 4/12 or 4/13 due to patient going to surgery for removal of PD catheter. Hgb 7.4, plts 62 - will watch. Will hold warfarin dose until cleared by surgery to continue. No bleeding noted at this time.  Goal of Therapy:  INR 2-3 Monitor platelets by anticoagulation protocol: Yes   Plan:  - Hold warfarin dose today - Daily PT/INR, CBC q72h - Will continue to monitor for any signs/symptoms of bleeding and will follow up with PT/INR in the a.m.     James [name], PharmD, BCPS, FNKF Clinical Pharmacist [place] Please utilize Amion for appropriate phone number to reach the unit pharmacist ([place])

## 2019-11-04 NOTE — Anesthesia Preprocedure Evaluation (Signed)
Anesthesia Evaluation  Patient identified by MRN, date of birth, ID band Patient awake    Reviewed: Allergy & Precautions, NPO status , Patient's Chart, lab work & pertinent test results  Airway Mallampati: II  TM Distance: >3 FB Neck ROM: Full    Dental no notable dental hx.    Pulmonary neg pulmonary ROS, former smoker,    Pulmonary exam normal breath sounds clear to auscultation       Cardiovascular hypertension, Pt. on medications and Pt. on home beta blockers Normal cardiovascular exam Rhythm:Regular Rate:Normal     Neuro/Psych negative neurological ROS  negative psych ROS   GI/Hepatic negative GI ROS, Neg liver ROS,   Endo/Other  diabetes, Insulin Dependent  Renal/GU DialysisRenal disease  negative genitourinary   Musculoskeletal negative musculoskeletal ROS (+)   Abdominal   Peds negative pediatric ROS (+)  Hematology  (+) anemia ,   Anesthesia Other Findings   Reproductive/Obstetrics negative OB ROS                             Anesthesia Physical  Anesthesia Plan  ASA: III  Anesthesia Plan: General   Post-op Pain Management:    Induction: Intravenous  PONV Risk Score and Plan: 2 and Ondansetron, Treatment may vary due to age or medical condition and Midazolam  Airway Management Planned: LMA  Additional Equipment:   Intra-op Plan:   Post-operative Plan: Extubation in OR  Informed Consent: I have reviewed the patients History and Physical, chart, labs and discussed the procedure including the risks, benefits and alternatives for the proposed anesthesia with the patient or authorized representative who has indicated his/her understanding and acceptance.     Dental advisory given  Plan Discussed with: CRNA and Surgeon  Anesthesia Plan Comments:         Anesthesia Quick Evaluation

## 2019-11-04 NOTE — Progress Notes (Signed)
Pt con't to do well Ready for exc of CAPD catheter  L sided CAPD catheter  To OR for exc of CAPD catheter. I d/w him the risks and benefits of the procedure to include but not limited to: infection, bleeding, damage to surrounding tissues, possible wound healing complications, possible need for further surgery.  PT voiced understanding and wishes to proceed.

## 2019-11-04 NOTE — Progress Notes (Signed)
Charge nurse informed of plan. Medications hand delivered to Mr Dugo. Transport (door to door) set up for 1645.

## 2019-11-04 NOTE — Discharge Summary (Signed)
Discharge Summary  James [name] QPR:[id number] DOB: [birth year]  PCP: [name], MD  Admit date: 10/28/2019 Discharge date: 11/04/2019  Time spent: 40 mins  Recommendations for Outpatient Follow-up:  1. Follow-up with PCP in 1 week with repeat labs 2. Follow-up with nephrology/dialysis  Discharge Diagnoses:  Active Hospital Problems   Diagnosis Date Noted  . Spontaneous bacterial peritonitis ([place]) 10/28/2019  . 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 ESRD on peritoneal dialysis ([place])   . Diabetes mellitus without complication ([place])   . Hypertension     Resolved Hospital Problems  No resolved problems to display.    Discharge Condition: Stable  Diet recommendation: Renal diet  Vitals:   11/04/19 1424 11/04/19 1446  BP: (!) 150/82 (!) 166/90  Pulse: 82 80  Resp: 12 18  Temp: 97.8 F (36.6 C)   SpO2: 100% 99%    History of present illness:  Patient is a 49 year old male with history of end-stage renal disease status post renal transplant in 2008  , with failure in 2018 now on peritoneal dialysis, diabetes type 2, hypertension, hyperlipidemia who presented to the emergency department with 2 days complaints of abdomen pain, nausea, vomiting, diarrhea.  Also reported fever, chills.  Patient admitted for the management of possible spontaneous bacterial peritonitis.  Underwent paracentesis with confirmatory diagnosis of SBP.  Peritoneal fluid culture showing Pseudomonas fluorescence so started on ceftazidime which has been changed to Cipro now. Nephrology consulted and following.He was having diarrhea, abdominal pain.  C. Difficile also came out to be positive, started on oral vancomycin. Underwent  tunneled dialysis catheter and right arm AV fistula/graft by vascular surgery on 11/02/2019. Underwent  first session of hemodialysis on 11/03/19. Removal of peritoneal dialysis catheter by general surgery was done on 11/04/19. Pt has been CLIP/has outpatient dialysis center  scheduled, next session on 11/05/19 (T/T/S)     Today, patient denies any new complaints, very eager to be discharged, has been threatening to leave AMA.  Denies any chest pain, worsening abdominal pain/diarrhea, denies any shortness of breath, fever/chills, nausea/vomiting.  Patient advised to follow-up with PCP in 1 week, compliance with HD.    Hospital Course:  Principal Problem:   Spontaneous bacterial peritonitis ([place]) Active Problems:   ESRD on peritoneal dialysis ([place])   Diabetes mellitus without complication ([place])   Hypertension   Renal transplant recipient   Sepsis likely 2/2 SBP On presentation, has been febrile, tachycardic Underwent paracentesis with findings of SBP BC x2 NGTD Peritoneal fluid culture grew Pseudomonas fluorescence, s/p ceftazidime, switched to ciprofloxacin to complete total of 14 days course Follow-up with PCP/nephrology with repeat labs  C diff diarrhea Presented with abdominal pain, diarrhea, currently resolved CT abdomen/pelvis did not show any signs of bowel inflammation. Found to have newly enlarged right external iliac lymph node, nonspecific. C. difficile antigen is positive, toxin is negative but patient was having diarrhea/abdominal pain so was started on oral vancomycin. Continue PO vanco for 7 days post finishing oral ciprofloxacin  ESRD now on HD, was on peritoneal dialysis Vascular surgery placed  temporary dialysis catheter , AV fistula on the right arm on 11/02/2019 Patient had first session of HD on 11/03/2019 PD catheter was removed on 11/04/2019 Nephrology consulted, follow up with [facility]. Percell Belt at [facility], restart prednisone, Prograf which have been held during this admission due to sepsis (Discussed with Dr Marval Regal on 11/04/19)  Diabetes type 2 Continue home regimen  Hypertension Uncontrolled Started amlodipine, increased home dose of lisinopril to 40 mg daily, continue  metoprolol  History of peripheral vascular  disease Status post recent right BKA  Pancytopenia/Hx of B-cell lymphoma/anemia of CKD In remission, chronic pancytopenia S/p 1 unit of transfusion of  PRBC on this admission.  Normal iron, high ferritin, high vitamin B12 Positive stool occult blood (likely 2/2 colitis), no signs of bleeding Follow up with hematology/oncology Dr. Cassell Clement, [facility] May need outpatient GI work if anemia/FOBT persists   History of EBV Continue PTA Valcyte  History of splenic infarcts Continue PTA Coumadin  Generalized weakness, debility, deconditioning PT recommended home health.         Malnutrition Type:      Malnutrition Characteristics:      Nutrition Interventions:      Estimated body mass index is 23.64 kg/m as calculated from the following:   Height as of this encounter: 5' 5.98" (1.676 m).   Weight as of this encounter: 66.4 kg.    Procedures: Vascular surgery placed  temporary dialysis catheter , AV fistula on the right arm on 11/02/2019 Patient had first session of HD on 11/03/2019 PD catheter was removed on 11/04/2019 Paracentesis  Consultations:  Nephrology  Vascular surgery  Discharge Exam: BP (!) 166/90 (BP Location: Left Wrist)   Pulse 80   Temp 97.8 F (36.6 C)   Resp 18   Ht 5' 5.98" (1.676 m)   Wt 66.4 kg   SpO2 99%   BMI 23.64 kg/m   General: NAD, talkative Cardiovascular: S1, S2 present Respiratory: CTA B Abdomen: Soft, nontender, nondistended, bowel sounds present    Discharge Instructions You were cared for by a hospitalist during your hospital stay. If you have any questions about your discharge medications or the care you received while you were in the hospital after you are discharged, you can call the unit and asked to speak with the hospitalist on call if the hospitalist that took care of you is not available. Once you are discharged, your primary care physician will handle any further medical issues. Please note that NO REFILLS  for any discharge medications will be authorized once you are discharged, as it is imperative that you return to your primary care physician (or establish a relationship with a primary care physician if you do not have one) for your aftercare needs so that they can reassess your need for medications and monitor your lab values.  Discharge Instructions    Diet - low sodium heart healthy   Complete by: As directed    Increase activity slowly   Complete by: As directed      Allergies as of 11/04/2019      Reactions   Tramadol Hcl Shortness Of Breath, Other (See Comments)   Tramadol HCl = Shortness of breath and Tramadol = Rash   Nsaids Other (See Comments)   Was told by MD to not take these   Darvon [propoxyphene] Rash, Other (See Comments)   Broke out on eyelid    Tramadol Rash, Other (See Comments)   Tramadol = Rash and Tramadol HCl = Shortness of breath      Medication List    TAKE these medications   acetaminophen 500 MG tablet Commonly known as: TYLENOL Take 500-1,000 mg by mouth every 6 (six) hours as needed (pain or headaches).   amLODipine 10 MG tablet Commonly known as: NORVASC Take 1 tablet (10 mg total) by mouth daily.   Aspirin Low Dose 81 MG EC tablet Generic drug: aspirin Take 81 mg by mouth daily.   B-12 500 MCG  Tabs Take 1 tablet by mouth daily.   calcitRIOL 0.25 MCG capsule Commonly known as: ROCALTROL Take 0.75 mcg by mouth daily.   Cardura 4 MG tablet Generic drug: doxazosin Take 4 mg by mouth at bedtime.   Centrum Men Tabs Take 1 tablet by mouth 2 (two) times daily.   ciprofloxacin 500 MG tablet Commonly known as: Cipro Take 1 tablet (500 mg total) by mouth daily for 10 days. THROUGH 11/15/2019 Start taking on: November 05, 2019   diphenhydrAMINE 25 mg capsule Commonly known as: BENADRYL Take 25 mg by mouth every 6 (six) hours as needed for itching.   ferric citrate 1 GM 210 MG(Fe) tablet Commonly known as: AURYXIA Take 210-420 mg by mouth See  admin instructions. Take 210-420 mg by mouth one to three times a day with meals and 210 mg with each snack   ferrous sulfate 325 (65 FE) MG tablet Take 325 mg by mouth 2 (two) times daily with a meal.   folic acid 1 MG tablet Commonly known as: FOLVITE Take 1 mg by mouth daily.   gabapentin 300 MG capsule Commonly known as: NEURONTIN Take 300 mg by mouth at bedtime.   insulin aspart 100 UNIT/ML FlexPen Commonly known as: NovoLOG FlexPen Inject 10 Units into the skin 3 (three) times daily with meals. What changed:   how much to take  when to take this  additional instructions   insulin detemir 100 UNIT/ML FlexPen Commonly known as: Levemir FlexTouch Inject 15 Units into the skin at bedtime. What changed: when to take this   latanoprost 0.005 % ophthalmic solution Commonly known as: XALATAN Place 1 drop into both eyes at bedtime.   lisinopril 40 MG tablet Commonly known as: ZESTRIL Take 1 tablet (40 mg total) by mouth daily. What changed:   medication strength  how much to take   loperamide 2 MG capsule Commonly known as: IMODIUM Take 2 mg by mouth as needed for diarrhea or loose stools.   magnesium oxide 400 MG tablet Commonly known as: MAG-OX Take by mouth daily.   Melatonin 5 MG Caps Take 5 mg by mouth at bedtime.   metoprolol tartrate 100 MG tablet Commonly known as: LOPRESSOR Take 100 mg by mouth 2 (two) times daily.   omeprazole 20 MG capsule Commonly known as: PRILOSEC Take 20 mg by mouth daily.   potassium chloride SA 20 MEQ tablet Commonly known as: KLOR-CON Take 20 mEq by mouth every Monday, Wednesday, and Friday.   predniSONE 5 MG tablet Commonly known as: DELTASONE Take 5 mg by mouth daily with breakfast.   sodium bicarbonate 650 MG tablet Take 1,300 mg by mouth 3 (three) times daily.   tacrolimus 0.5 MG capsule Commonly known as: PROGRAF Take 0.5 mg by mouth daily.   temazepam 15 MG capsule Commonly known as: RESTORIL Take 15  mg by mouth at bedtime.   terazosin 1 MG capsule Commonly known as: HYTRIN Take 1 mg by mouth at bedtime.   valGANciclovir 450 MG tablet Commonly known as: VALCYTE Take 450 mg by mouth See admin instructions. Take one tablet (450 mg) by mouth on Monday, Wednesday, Friday before dialysis   vancomycin 125 MG capsule Commonly known as: VANCOCIN Take 1 capsule (125 mg total) by mouth 4 (four) times daily for 16 days. THROUGH 11/20/2019   warfarin 5 MG tablet Commonly known as: COUMADIN Take 5-7.5 mg by mouth See admin instructions. 5mg  every day except 7.5mg  on Tuesday and Thursday   zolpidem 10 MG  tablet Commonly known as: AMBIEN Take 10 mg by mouth at bedtime as needed for sleep.      Allergies  Allergen Reactions  . Tramadol Hcl Shortness Of Breath and Other (See Comments)    Tramadol HCl = Shortness of breath and Tramadol = Rash  . Nsaids Other (See Comments)    Was told by MD to not take these  . Darvon [Propoxyphene] Rash and Other (See Comments)    Broke out on eyelid   . Tramadol Rash and Other (See Comments)    Tramadol = Rash and Tramadol HCl = Shortness of breath   Follow-up Information    Hessie Dibble, MD. Schedule an appointment as soon as possible for a visit in 2 week(s).   Specialty: Hematology and Oncology Contact information: [place] [address] [phone]        Care, [facility] Follow up.   Specialty: [place] Why: physical therapy Contact information: [address] [place] [address] [phone]            The results of significant diagnostics from this hospitalization (including imaging, microbiology, ancillary and laboratory) are listed below for reference.    Significant Diagnostic Studies: CT ABDOMEN PELVIS WO CONTRAST  Result Date: 10/28/2019 CLINICAL DATA:  Acute generalized abdominal pain, nausea and vomiting. EXAM: CT ABDOMEN AND PELVIS WITHOUT CONTRAST  TECHNIQUE: Multidetector CT imaging of the abdomen and pelvis was performed following the standard protocol without IV contrast. COMPARISON:  03/14/2019 CT abdomen/pelvis. FINDINGS: Lower chest: No significant pulmonary nodules or acute consolidative airspace disease. Low cardiac blood pool density suggests anemia. Suggestion of right coronary atherosclerosis. Hepatobiliary: Normal liver size. Simple scattered small right liver cysts, largest 1.2 cm. A few scattered subcentimeter hypodense liver lesions are too small to characterize and are unchanged and presumably benign. No new liver lesions. Cholecystectomy no biliary ductal dilatation. Pancreas: Normal, with no mass or duct dilation. Spleen: Normal size. No mass. Adrenals/Urinary Tract: Normal adrenals. Severe symmetric bilateral renal atrophy, unchanged. No hydronephrosis. No renal stones. Simple exophytic 1.7 cm posterior lower left renal cyst. No additional contour deforming renal lesions. Right lower quadrant renal transplant demonstrates no hydronephrosis, no renal stones and no contour deforming renal masses. Nondistended bladder obscured by streak artifact from right hip hardware with no gross bladder abnormality. Stomach/Bowel: Normal non-distended stomach. Normal caliber small bowel with no small bowel wall thickening. Candidate normal appendix. Scattered mild colonic diverticulosis with no large bowel wall thickening. Vascular/Lymphatic: Atherosclerotic nonaneurysmal abdominal aorta. Newly enlarged 1.3 cm right external iliac node (series 3/image 74). No additional pathologically enlarged lymph nodes in the abdomen or pelvis. Reproductive: Normal size prostate. Other: No pneumoperitoneum. Percutaneous dialysis catheter terminates in the anterior right lower peritoneal cavity. Small volume ascites with no focal fluid collection. Musculoskeletal: No aggressive appearing focal osseous lesions. Right total hip arthroplasty. IMPRESSION: 1. Newly enlarged  right external iliac lymph node, nonspecific. Suggest attention on follow-up CT abdomen/pelvis in 3 months. This recommendation follows ACR consensus guidelines: White Paper of the ACR Incidental Findings Committee II on Splenic and Nodal Findings. J Am Coll Radiol 2013;10:789-794. 2. Small volume ascites. Percutaneous dialysis catheter terminates in the anterior right lower peritoneal cavity. 3. No evidence of bowel obstruction or acute bowel inflammation. Scattered mild colonic diverticulosis with no evidence of acute diverticulitis. 4. Severe symmetric bilateral native renal atrophy. Right lower quadrant renal transplant appears unremarkable. 5. Low cardiac blood pool density suggests anemia. 6. Aortic Atherosclerosis (ICD10-I70.0). Electronically  Signed   By: Ilona Sorrel M.D.   On: 10/28/2019 15:50   DG Chest Port 1 View  Result Date: 11/02/2019 CLINICAL DATA:  Central line placement. EXAM: PORTABLE CHEST 1 VIEW COMPARISON:  March 14, 2019. FINDINGS: The heart size and mediastinal contours are within normal limits. Both lungs are clear. Interval placement of left internal jugular catheter with distal tip in expected position of cavoatrial junction. No pneumothorax or pleural effusion is noted. The visualized skeletal structures are unremarkable. IMPRESSION: Interval placement of left internal jugular catheter with distal tip in expected position of cavoatrial junction. No pneumothorax is noted. Electronically Signed   By: Marijo Conception M.D.   On: 11/02/2019 15:27   DG Fluoro Guide CV Line Left  Result Date: 11/03/2019 CLINICAL DATA:  49 year old with end-stage renal disease. Placement of dialysis catheter by Dr. Ruta Hinds. EXAM: INTRAOPERATIVE FLUOROSCOPY CONTRAST:  See operative report FLUOROSCOPY TIME:  Fluoroscopy Time:  2 minutes and 6 seconds COMPARISON:  Chest radiograph 03/27/2019 FINDINGS: Venogram was performed in the left internal jugular vein. Mild narrowing near the junction of the  left internal jugular vein and left innominate vein. Left innominate vein and SVC are patent. Left internal jugular dialysis catheter was placed. Tip of the catheter is not visualized. IMPRESSION: Placement of left internal jugular dialysis catheter. Catheter tip not visualized. Patent central veins. Electronically Signed   By: Markus Daft M.D.   On: 11/03/2019 08:10   VAS [place] UPPER EXT VEIN MAPPING (PRE-OP AVF)  Result Date: 11/01/2019 UPPER EXTREMITY VEIN MAPPING  Indications: Pre-dialysis access. History: ESRD on peritoneal dialysis. Previous left upper arm fistula that was          usable and right upper extremity fistula that was never usable. History          of renal transplant 2009.  Limitations: IV, body habitus Comparison Study: No prior study on file Performing Technologist: Sharion Dove RVS  Examination Guidelines: A complete evaluation includes B-mode imaging, spectral Doppler, color Doppler, and power Doppler as needed of all accessible portions of each vessel. Bilateral testing is considered an integral part of a complete examination. Limited examinations for reoccurring indications may be performed as noted. +-----------------+-------------+----------+--------------+ Right Cephalic   Diameter (cm)Depth (cm)   Findings    +-----------------+-------------+----------+--------------+ Prox upper arm       0.36        0.33                  +-----------------+-------------+----------+--------------+ Mid upper arm        0.30        0.47                  +-----------------+-------------+----------+--------------+ Dist upper arm       0.29        0.27     branching    +-----------------+-------------+----------+--------------+ Antecubital fossa    0.19        0.30                  +-----------------+-------------+----------+--------------+ Prox forearm         0.17        0.39                  +-----------------+-------------+----------+--------------+ Mid forearm           0.15        0.37     branching    +-----------------+-------------+----------+--------------+ Wrist  not visualized +-----------------+-------------+----------+--------------+ +-----------------+-------------+----------+--------------+ Right Basilic    Diameter (cm)Depth (cm)   Findings    +-----------------+-------------+----------+--------------+ Prox upper arm       0.55        0.65       origin     +-----------------+-------------+----------+--------------+ Mid upper arm        0.46        0.60                  +-----------------+-------------+----------+--------------+ Dist upper arm       0.46        0.48     branching    +-----------------+-------------+----------+--------------+ Antecubital fossa    0.37        0.54                  +-----------------+-------------+----------+--------------+ Prox forearm         0.22        0.23     branching    +-----------------+-------------+----------+--------------+ Mid forearm          0.22        0.24                  +-----------------+-------------+----------+--------------+ Wrist                                   not visualized +-----------------+-------------+----------+--------------+ +-----------------+-------------+----------+--------------+ Left Cephalic    Diameter (cm)Depth (cm)   Findings    +-----------------+-------------+----------+--------------+ Mid upper arm                           not visualized +-----------------+-------------+----------+--------------+ Dist upper arm                          not visualized +-----------------+-------------+----------+--------------+ Antecubital fossa    0.45        0.22     branching    +-----------------+-------------+----------+--------------+ Prox forearm         0.26        0.35     branching    +-----------------+-------------+----------+--------------+ Mid forearm          0.20        0.64                   +-----------------+-------------+----------+--------------+ Wrist                                   not visualized +-----------------+-------------+----------+--------------+ +-----------------+-------------+----------+--------+ Left Basilic     Diameter (cm)Depth (cm)Findings +-----------------+-------------+----------+--------+ Mid upper arm       735.00       1.19    origin  +-----------------+-------------+----------+--------+ Dist upper arm       0.68        0.75            +-----------------+-------------+----------+--------+ Antecubital fossa    0.24        0.63            +-----------------+-------------+----------+--------+ Prox forearm         0.30        0.27            +-----------------+-------------+----------+--------+ Mid forearm          0.23        0.47            +-----------------+-------------+----------+--------+  Wrist                0.27        0.31            +-----------------+-------------+----------+--------+ *See table(s) above for measurements and observations.  Diagnosing physician: Servando Snare MD Electronically signed by Servando Snare MD on 11/01/2019 at 1:40:45 PM.    Final     Microbiology: Recent Results (from the past 240 hour(s))  SARS CORONAVIRUS 2 (TAT 6-24 HRS) Nasopharyngeal Nasopharyngeal Swab     Status: None   Collection Time: 10/28/19  6:27 PM   Specimen: Nasopharyngeal Swab  Result Value Ref Range Status   SARS Coronavirus 2 NEGATIVE NEGATIVE Final    Comment: (NOTE) SARS-CoV-2 target nucleic acids are NOT DETECTED. The SARS-CoV-2 RNA is generally detectable in upper and lower respiratory specimens during the acute phase of infection. Negative results do not preclude SARS-CoV-2 infection, do not rule out co-infections with other pathogens, and should not be used as the sole basis for treatment or other patient management decisions. Negative results must be combined with clinical observations, patient history,  and epidemiological information. The expected result is Negative. Fact Sheet for Patients: SugarRoll.be Fact Sheet for Healthcare Providers: https://www.woods-mathews.com/ This test is not yet approved or cleared by the [place] FDA and  has been authorized for detection and/or diagnosis of SARS-CoV-2 by FDA under an Emergency Use Authorization (EUA). This EUA will remain  in effect (meaning this test can be used) for the duration of the COVID-19 declaration under Section 56 4(b)(1) of the Act, 21 U.S.C. section 360bbb-3(b)(1), unless the authorization is terminated or revoked sooner. Performed at [facility], [place] [address], [place], [place] [address]   Gram stain     Status: None   Collection Time: 10/28/19  9:40 PM   Specimen: Body Fluid  Result Value Ref Range Status   Specimen Description FLUID PERITONEAL DIALYSATE  Final   Special Requests NONE  Final   Gram Stain   Final    MODERATE WBC PRESENT, PREDOMINANTLY PMN NO ORGANISMS SEEN Performed at [facility], [address] [place], [place] [address]    Report Status 10/29/2019 FINAL  Final  Culture, body fluid-bottle     Status: None   Collection Time: 10/28/19  9:40 PM   Specimen: Fluid  Result Value Ref Range Status   Specimen Description FLUID PERITONEAL DIALYSATE  Final   Special Requests   Final    BOTTLES DRAWN AEROBIC AND ANAEROBIC Blood Culture adequate volume   Culture   Final    FEW PSEUDOMONAS FLUORESCENS CRITICAL RESULT CALLED TO, READ BACK BY AND VERIFIED WITH: Evangeline Dakin RN, AT [phone] 11/01/19 Performed at [facility], [address] [place], [place] [address]    Report Status 11/03/2019 FINAL  Final   Organism ID, Bacteria PSEUDOMONAS FLUORESCENS  Final      Susceptibility   Pseudomonas fluorescens - MIC*    CEFTAZIDIME <=1 SENSITIVE Sensitive     CIPROFLOXACIN <=0.25 SENSITIVE Sensitive     GENTAMICIN <=1 SENSITIVE Sensitive      IMIPENEM 2 SENSITIVE Sensitive     PIP/TAZO 8 SENSITIVE Sensitive     * FEW PSEUDOMONAS FLUORESCENS  Culture, blood (routine x 2)     Status: None   Collection Time: 10/29/19 12:46 AM   Specimen: BLOOD  Result Value Ref Range Status   Specimen Description BLOOD RIGHT HAND  Final   Special Requests   Final  BOTTLES DRAWN AEROBIC AND ANAEROBIC Blood Culture adequate volume   Culture   Final    NO GROWTH 5 DAYS Performed at [facility], [place] [address], [place], [place] [address]    Report Status 11/03/2019 FINAL  Final  Culture, blood (routine x 2)     Status: None   Collection Time: 10/29/19 12:47 AM   Specimen: BLOOD  Result Value Ref Range Status   Specimen Description BLOOD RIGHT FINGER  Final   Special Requests   Final    BOTTLES DRAWN AEROBIC ONLY Blood Culture adequate volume   Culture   Final    NO GROWTH 5 DAYS Performed at [facility], [place] [address] [place], [place] [address]    Report Status 11/03/2019 FINAL  Final  Gastrointestinal Panel by PCR , Stool     Status: None   Collection Time: 10/30/19 12:43 PM   Specimen: Stool  Result Value Ref Range Status   Campylobacter species NOT DETECTED NOT DETECTED Final   Plesimonas shigelloides NOT DETECTED NOT DETECTED Final   Salmonella species NOT DETECTED NOT DETECTED Final   Yersinia enterocolitica NOT DETECTED NOT DETECTED Final   Vibrio species NOT DETECTED NOT DETECTED Final   Vibrio cholerae NOT DETECTED NOT DETECTED Final   Enteroaggregative E coli (EAEC) NOT DETECTED NOT DETECTED Final   Enteropathogenic E coli (EPEC) NOT DETECTED NOT DETECTED Final   Enterotoxigenic E coli (ETEC) NOT DETECTED NOT DETECTED Final   Shiga like toxin producing E coli (STEC) NOT DETECTED NOT DETECTED Final   Shigella/Enteroinvasive E coli (EIEC) NOT DETECTED NOT DETECTED Final   Cryptosporidium NOT DETECTED NOT DETECTED Final   Cyclospora cayetanensis NOT DETECTED NOT DETECTED Final   Entamoeba histolytica NOT  DETECTED NOT DETECTED Final   Giardia lamblia NOT DETECTED NOT DETECTED Final   Adenovirus F40/41 NOT DETECTED NOT DETECTED Final   Astrovirus NOT DETECTED NOT DETECTED Final   Norovirus GI/GII NOT DETECTED NOT DETECTED Final   Rotavirus A NOT DETECTED NOT DETECTED Final   Sapovirus (I, II, IV, and V) NOT DETECTED NOT DETECTED Final    Comment: Performed at [facility], [address] [place], [place] [address]  C Difficile Quick Screen w PCR reflex     Status: Abnormal   Collection Time: 10/30/19 12:43 PM   Specimen: Stool  Result Value Ref Range Status   C Diff antigen POSITIVE (A) NEGATIVE Final   C Diff toxin NEGATIVE NEGATIVE Final   C Diff interpretation Results are indeterminate. See PCR results.  Final    Comment: Performed at [facility], [place] [address], [place], [place] [address]  C. Diff by PCR, Reflexed     Status: Abnormal   Collection Time: 10/30/19 12:43 PM  Result Value Ref Range Status   Toxigenic C. Difficile by PCR POSITIVE (A) NEGATIVE Final    Comment: Positive for toxigenic C. difficile with little to no toxin production. Only treat if clinical presentation suggests symptomatic illness. Performed at [facility], [place] [address], [place], [place] [address]   Surgical pcr screen     Status: None   Collection Time: 11/01/19  9:51 PM   Specimen: Nasal Mucosa; Nasal Swab  Result Value Ref Range Status   MRSA, PCR NEGATIVE NEGATIVE Final   Staphylococcus aureus NEGATIVE NEGATIVE Final    Comment: (NOTE) The Xpert SA Assay (FDA approved for NASAL specimens in patients 63 years of age and older), is one component of a  comprehensive surveillance program. It is not intended to diagnose infection nor to guide or monitor treatment. Performed at [facility], [place] [address], [place], [place] [address]      Labs: Basic Metabolic Panel: Recent Labs  Lab 10/30/19 0536 10/30/19 0536 10/31/19 0810 11/01/19 0533 11/02/19 0508  11/03/19 0332 11/04/19 0436  NA 135   < > 131* 133* 135 138 138  K 4.0   < > 3.6 3.2* 3.3* 4.0 3.9  CL 94*   < > 94* 93* 93* 97* 104  CO2 26   < > 24 26 28 27 25   GLUCOSE 82   < > 89 136* 118* 136* 84  BUN 38*   < > 35* 30* 28* 38* 20  CREATININE 13.29*   < > 12.73* 11.49* 11.96* 14.31* 8.89*  CALCIUM 8.5*   < > 7.9* 8.3* 8.4* 8.3* 8.7*  PHOS 5.3*  --   --   --  5.0*  --  4.6   < > = values in this interval not displayed.   Liver Function Tests: Recent Labs  Lab 11/02/19 0508 11/04/19 0436  ALBUMIN 1.8* 1.7*   No results for input(s): LIPASE, AMYLASE in the last 168 hours. No results for input(s): AMMONIA in the last 168 hours. CBC: Recent Labs  Lab 10/31/19 0810 10/31/19 0810 10/31/19 1839 11/01/19 0533 11/02/19 0508 11/03/19 0332 11/04/19 0436  WBC 1.7*  --   --  1.5* 1.9* 2.1* 2.1*  NEUTROABS 0.7*  --   --  0.6* 0.9* 1.5* 1.1*  HGB 6.7*   < > 8.3* 8.6* 9.2* 8.2* 7.4*  HCT 19.4*   < > 23.6* 24.7* 27.1* 23.9* 22.3*  MCV 100.5*  --   --  97.2 99.6 99.2 103.2*  PLT 92*  --   --  85* 91* 64* 62*   < > = values in this interval not displayed.   Cardiac Enzymes: No results for input(s): CKTOTAL, CKMB, CKMBINDEX, TROPONINI in the last 168 hours. BNP: BNP (last 3 results) No results for input(s): BNP in the last 8760 hours.  ProBNP (last 3 results) No results for input(s): PROBNP in the last 8760 hours.  CBG: Recent Labs  Lab 11/03/19 1618 11/03/19 2155 11/04/19 0721 11/04/19 1135 11/04/19 1405  GLUCAP 85 103* 72 81 79       Signed:  Alma Friendly, MD [facility] 11/04/2019, 3:41 PM

## 2019-11-04 NOTE — Op Note (Signed)
11/04/2019  1:40 PM  PATIENT:  James [name]  49 y.o. male  PRE-OPERATIVE DIAGNOSIS:  Infected PD catheter  POST-OPERATIVE DIAGNOSIS:  Infected PD catheter  PROCEDURE:  Procedure(s): CONTINUOUS AMBULATORY PERITONEAL DIALYSIS  (CAPD) CATHETER REMOVAL (Left)  SURGEON:  Surgeon(s) and Role:    Ralene Ok, MD - Primary  ANESTHESIA:   general  EBL:  2 mL   BLOOD ADMINISTERED:none  DRAINS: none   LOCAL MEDICATIONS USED:  NONE  SPECIMEN:  Source of Specimen:  Catheter tip  DISPOSITION OF SPECIMEN:  Microbiology  COUNTS:  YES  TOURNIQUET:  * No tourniquets in log *  DICTATION: .Dragon Dictation Details of procedure: After the patient was consented he was taken back to the OR and placed in supine position with bilateral SCDs in place.  He underwent general trach intubation.  Patient was then prepped and draped standard fashion.  Timeout was called and all facts verified.  Small 1 cm incisions were made at the area of the palpable cuffs.  2 incisions were made.  Dissection was taken down with cautery to maintain hemostasis and the caps were individually freed up from surrounding tissue.  This allowed [place] to remove the entire catheter including the tip.  The tip was sent off to microbiology for culture.  At this time the incision sites and the catheter exit site was checked for hemostasis which was excellent.  The incision sites and the catheter site was then reapproximated using 4-0 Monocryl in subcuticular fashion.  The skin was dressed with Dermabond.  The patient taught the procedure well was taken to the recovery in stable condition.  PLAN OF CARE: admitted  PATIENT DISPOSITION:  PACU - hemodynamically stable.   Delay start of Pharmacological VTE agent (>24hrs) due to surgical blood loss or risk of bleeding: not applicable

## 2019-11-04 NOTE — Transfer of Care (Signed)
Immediate Anesthesia Transfer of Care Note  Patient: James [name]  Procedure(s) Performed: CONTINUOUS AMBULATORY PERITONEAL DIALYSIS  (CAPD) CATHETER REMOVAL (Left Abdomen)  Patient Location: PACU  Anesthesia Type:General  Level of Consciousness: drowsy and patient cooperative  Airway & Oxygen Therapy: Patient Spontanous Breathing and Patient connected to face mask oxygen  Post-op Assessment: Report given to RN and Post -op Vital signs reviewed and stable  Post vital signs: Reviewed and stable  Last Vitals:  Vitals Value Taken Time  BP    Temp    Pulse 95 11/04/19 1401  Resp 12 11/04/19 1401  SpO2 93 % 11/04/19 1401  Vitals shown include unvalidated device data.  Last Pain:  Vitals:   11/04/19 0955  TempSrc: Oral  PainSc:       Patients Stated Pain Goal: 0 (15/94/58 [id number])  Complications: No apparent anesthesia complications

## 2019-11-05 NOTE — Anesthesia Postprocedure Evaluation (Signed)
Anesthesia Post Note  Patient: [place]  Procedure(s) Performed: CONTINUOUS AMBULATORY PERITONEAL DIALYSIS  (CAPD) CATHETER REMOVAL (Left Abdomen)     Patient location during evaluation: PACU Anesthesia Type: General Level of consciousness: awake and alert Pain management: pain level controlled Vital Signs Assessment: post-procedure vital signs reviewed and stable Respiratory status: spontaneous breathing, nonlabored ventilation and respiratory function stable Cardiovascular status: blood pressure returned to baseline and stable Postop Assessment: no apparent nausea or vomiting Anesthetic complications: no    Last Vitals:  Vitals:   11/04/19 1424 11/04/19 1446  BP: (!) 150/82 (!) 166/90  Pulse: 82 80  Resp: 12 18  Temp: 36.6 C   SpO2: 100% 99%    Last Pain:  Vitals:   11/04/19 1424  TempSrc:   PainSc: 0-No pain                 Lynda Rainwater

## 2019-11-07 LAB — CATH TIP CULTURE: Culture: NO GROWTH

## 2019-11-09 LAB — GLUCOSE, CAPILLARY: Glucose-Capillary: 137 mg/dL — ABNORMAL HIGH (ref 70–99)

## 2020-02-18 DIAGNOSIS — N186 End stage renal disease: Secondary | ICD-10-CM | POA: Insufficient documentation

## 2020-02-25 ENCOUNTER — Other Ambulatory Visit: Payer: Self-pay

## 2020-02-25 DIAGNOSIS — N186 End stage renal disease: Secondary | ICD-10-CM

## 2020-03-10 ENCOUNTER — Ambulatory Visit (HOSPITAL_COMMUNITY)
Admission: RE | Admit: 2020-03-10 | Discharge: 2020-03-10 | Disposition: A | Discharge status: Home / Self Care | Payer: Medicare Other | Source: Ambulatory Visit | Attending: Vascular Surgery | Admitting: Vascular Surgery

## 2020-03-10 ENCOUNTER — Encounter: Payer: Self-pay | Admitting: Vascular Surgery

## 2020-03-10 ENCOUNTER — Ambulatory Visit (INDEPENDENT_AMBULATORY_CARE_PROVIDER_SITE_OTHER): Payer: Medicare Other | Admitting: Vascular Surgery

## 2020-03-10 ENCOUNTER — Other Ambulatory Visit: Payer: Self-pay

## 2020-03-10 VITALS — BP 164/91 | HR 87 | Temp 98.0°F | Resp 20 | Ht 65.0 in | Wt 150.0 lb

## 2020-03-10 DIAGNOSIS — N186 End stage renal disease: Secondary | ICD-10-CM | POA: Insufficient documentation

## 2020-03-10 DIAGNOSIS — Z992 Dependence on renal dialysis: Secondary | ICD-10-CM

## 2020-03-10 NOTE — Progress Notes (Signed)
Patient is a 49 year old male who returns for follow-up today after placement of a right brachiocephalic AV fistula.  The fistula was placed November 02, 2019.  It was a fairly small vein.  He also had a left internal jugular vein palindrome catheter placed simultaneously.  He has no narrowing of the left internal jugular vein innominate junction.  He has no symptoms of steal in his right hand.  His hemodialysis today is Tuesday Thursday Saturday.  He previously had a left upper arm AV still which has now failed.  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  Assessment: Nonmaturing AV fistula right arm.  Patient will need a right upper arm AV graft.  Plan: Patient scheduled for right upper arm AV graft April 18, 2020.  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  We will need to stop this perioperatively.  We will stop it 3 days prior and then resume postoperatively.  Risk benefits possible complications and procedure details including not limited to bleeding infection graft thrombosis ischemic steal were discussed the patient today.  He understands and agrees to proceed.

## 2020-03-11 ENCOUNTER — Telehealth: Payer: Self-pay | Admitting: *Deleted

## 2020-03-11 NOTE — Telephone Encounter (Signed)
Left VM for pt to call to schedule surgery

## 2020-03-11 NOTE — Telephone Encounter (Signed)
2:16pm- Patient left VM stating he would like a call back to schedule surgery. If he does not answer it is ok to leave a detailed VM with Surgery date and details. Message given to Ellsworth Lennox, RN.

## 2020-03-14 ENCOUNTER — Encounter: Payer: Self-pay | Admitting: *Deleted

## 2020-03-14 ENCOUNTER — Other Ambulatory Visit: Payer: Self-pay | Admitting: *Deleted

## 2020-04-04 ENCOUNTER — Telehealth: Payer: Self-pay

## 2020-04-04 NOTE — Telephone Encounter (Signed)
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 Pt was recommended to reschedule his AVG placement for 9/20 until infection cleared. Surgery rescheduled for 04/25/20 with Covid test on 10/1. Pt voiced understanding. Updated letter will be mailed to patient.

## 2020-04-08 ENCOUNTER — Other Ambulatory Visit (HOSPITAL_COMMUNITY): Payer: Medicare Other

## 2020-04-22 ENCOUNTER — Telehealth: Payer: Self-pay

## 2020-04-22 ENCOUNTER — Encounter (HOSPITAL_COMMUNITY): Payer: Self-pay | Admitting: Vascular Surgery

## 2020-04-22 ENCOUNTER — Other Ambulatory Visit (HOSPITAL_COMMUNITY): Payer: Medicare Other

## 2020-04-22 NOTE — Telephone Encounter (Signed)
Placed call to follow up with patient regarding plans for surgery on 10/4 and transportation. Patient stated he forgot to call back, but that he has a friend that will take provide transportation for him and support after surgery. Preadmission nurse Arbie Cookey notified.

## 2020-04-22 NOTE — Progress Notes (Signed)
PCP - Carlyn Reichert, DO Cardiologist - Retia Passe, PA-C Hematology - Dr Cassell Clement Nephrology - Dr Cay Schillings  Chest x-ray - n/a EKG - 10/29/19 Stress Test -  Yes, normal ECHO - 08/15/15 Cardiac Cath - n/a  Fasting Blood Sugar - 98-130s Checks Blood Sugar _2_ times a day DM 2, diet controlled, no meds  . If your blood sugar is less than 70 mg/dL, you will need to treat for low blood sugar: o Treat a low blood sugar (less than 70 mg/dL) with  cup of clear juice (cranberry or apple), 4 glucose tablets, OR glucose gel. o Recheck blood sugar in 15 minutes after treatment (to make sure it is greater than 70 mg/dL). If your blood sugar is not greater than 70 mg/dL on recheck, call [phone] for further instructions.  Blood Thinner Instructions:  Last dose of coumadin was on 04/20/20.     Anesthesia review: Yes  STOP now taking any Aspirin (unless otherwise instructed by your surgeon), Aleve, Naproxen, Ibuprofen, Motrin, Advil, Goody's, BC's, all herbal medications, fish oil, and all vitamins.   Coronavirus Screening Covid test scheduled on 04/22/20 Do you have any of the following symptoms:  Cough yes/no: No Fever (>100.64F)  yes/no: No Runny nose yes/no: No Sore throat yes/no: No Difficulty breathing/shortness of breath  yes/no: No  Have you traveled in the last 14 days and where? yes/no: No  Patient verbalized understanding of instructions that were given via phone.

## 2020-04-22 NOTE — Telephone Encounter (Signed)
Contacted patient per concerns from [place] with [facility] preadmission of patient verbalizing that he lives alone and plans to take public transportation day of surgery with no support afterwards.   Discussed these concerns and safety requirements with patient. Patient will contact a friend to attempt arrange transportation and support post surgery and contact office back to verify.

## 2020-04-22 NOTE — Anesthesia Preprocedure Evaluation (Addendum)
Anesthesia Evaluation  Patient identified by MRN, date of birth, ID band Patient awake    Reviewed: Allergy & Precautions, NPO status , Patient's Chart, lab work & pertinent test results, reviewed documented beta blocker date and time   History of Anesthesia Complications (+) PROLONGED EMERGENCE and history of anesthetic complications  Airway Mallampati: II  TM Distance: >3 FB Neck ROM: Full    Dental  (+) Dental Advisory Given   Pulmonary Current Smoker and Patient abstained from smoking.,    Pulmonary exam normal        Cardiovascular hypertension, Pt. on home beta blockers and Pt. on medications + Peripheral Vascular Disease  Normal cardiovascular exam     Neuro/Psych negative neurological ROS  negative psych ROS   GI/Hepatic Neg liver ROS, GERD  Controlled,  Endo/Other  diabetes, Type 2  Renal/GU ESRF and DialysisRenal disease     Musculoskeletal negative musculoskeletal ROS (+)   Abdominal   Peds  Hematology  (+) anemia ,   Anesthesia Other Findings Covid test negative   Reproductive/Obstetrics                           Anesthesia Physical Anesthesia Plan  ASA: III  Anesthesia Plan: MAC   Post-op Pain Management:    Induction: Intravenous  PONV Risk Score and Plan: 1 and Propofol infusion and Treatment may vary due to age or medical condition  Airway Management Planned: Natural Airway and Simple Face Mask  Additional Equipment: None  Intra-op Plan:   Post-operative Plan:   Informed Consent: I have reviewed the patients History and Physical, chart, labs and discussed the procedure including the risks, benefits and alternatives for the proposed anesthesia with the patient or authorized representative who has indicated his/her understanding and acceptance.       Plan Discussed with: CRNA and Anesthesiologist  Anesthesia Plan Comments: (  )      Anesthesia  Quick Evaluation

## 2020-04-22 NOTE — Progress Notes (Signed)
Patient states he was planning on using [place] to drive him home after d/c from hospital.  Patient informed that he would have to have an adult with him in order to use [facility] for transportation.  Patient stated he lives alone and does not have anyone that can help. Patient informed that I would contact MD's office to let them know of the transportation issue.    Called office [phone], left message on machine for a return call.

## 2020-04-22 NOTE — Progress Notes (Signed)
Kia from MD's office returned my call.  Informed her that patient did not have an adult who could stay with him after surgery or ride home after d/c with Education officer, environmental.  Kia to discuss situation with MD and will follow up with patient.

## 2020-04-22 NOTE — Progress Notes (Signed)
Anesthesia Chart Review: SAME DAY WORK-UP    Case: [id number] Date/Time: 04/25/20 0950   Procedure: INSERTION OF ARTERIOVENOUS (AV) GORE-TEX GRAFT ARM (Right )   Anesthesia type: Choice   Pre-op diagnosis: esrd   [facility] ROOM 3 / [facility]   Surgeons: Elam Dutch, MD      DISCUSSION: Patient is a 49 year old male scheduled for the above procedure.  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 Reported he is slow to wake up after anesthesia.   PAT phone RN is communicating with surgeon's office regarding patient reportedly not having anyone to stay with him for 24 hours after surgery. He uses [employer].  Last warfarin dose 04/20/2020.  He is scheduled for presurgical COVID-19 testing on 04/22/2020.  He is a same-day work-up, so he is for labs and anesthesia team to evaluation on the day of surgery.      VS: Ht _0  (1.702 m)   Wt 67.1 kg   BMI 23.18 kg/m   BP Readings from Last 3 Encounters:  03/10/20 (!) 164/91  11/04/19 (!) 166/90  06/04/19 (!) 185/106   Pulse Readings from Last 3 Encounters:  03/10/20 87  11/04/19 80  06/04/19 88    PROVIDERS: [name], MD is listed as PCP. Last visit 02/24/20 with Carlyn Reichert, DO for annual physical ([place]) Beatris Ship, MD is vascular surgeon ([place]) for PAD Alver Sorrow, MD  is HEM-ONC ([place]) Fenton Malling, MD is urologist ([place]) Cay Schillings, MD is nNephrologist   LABS: For day of surgery. Labs from 04/05/20 [facility]) include c-Met and CBC.  ALT and AST within normal limits.  WBC 5.1, hemoglobin 10.6, hematocrit 31.2, platelet count 89K (PLT range ~ 62-107K since 09/13/19 except one reading of 150K on 10/28/19).  A1c 4.9% on 02/24/2020 [facility]).   IMAGES: 1V PCXR 11/02/19: FINDINGS: The heart size and mediastinal contours are within normal limits. Both lungs are clear. Interval placement of left internal jugular catheter with distal tip in expected position of cavoatrial junction. No pneumothorax or pleural effusion is noted. The visualized skeletal structures are unremarkable. IMPRESSION: Interval placement of left internal jugular catheter with distal tip in expected position of cavoatrial junction. No pneumothorax is noted.   EKG: 10/29/19:  Sinus tachycardia at 120 bpm Nonspecific ST and T wave abnormality Abnormal ECG Lateral TWI New since previous tracing Confirmed by Kirk Ruths [phone]) on 10/29/2019 8:42:14 AM   CV: LLE arteriogram 02/08/20 [facility]): OPERATIVE FINDINGS: Aorto-iliac segment: Patent without evidence of hemodynamically significant stenosis.  Common femoral: Patent without evidence of hemodynamically significant stenosis.  SFA: Patent without evidence of hemodynamically significant stenosis.  Profunda: Patent without evidence of hemodynamically significant stenosis.  Popliteal: Patent without evidence of hemodynamically significant stenosis.  Tibials: Trifurcation patent with PT as dominant flow to the foot. PT and AT cross the ankle into a disease DP and plantar arch with significant small vessel disease and minimal flow into the toes. The peroneal dissipates just above the ankle  with faint flow into the posterior crows foot.  - Per Dr. Harriet Pho report, "Discussed that no further  revascularization options are available. Will refer to podiatry for management of toe wound. May progress to requiring either a toe amp, TMA, or even BKA."  Echo with bubble study 08/27/19 [facility] CE): SUMMARY  Injection of agitated saline contrast performed to evaluate for  possible shunt  There is mild concentric left ventricular hypertrophy.  Left ventricular systolic function is low normal.  LV ejection fraction = 50-55%.  The left atrial size is normal.  There is no significant change in comparison with the last study  Report done elsewhere in 2019  No intracardiac source of embolus identified   Reported history of prior normal stress test.  Denied prior cardiac cath.   Past Medical History:  Diagnosis Date  . Anemia   . Complication of anesthesia    slow to wake up  . Diabetes mellitus without complication ([place])    type 2 - pt states no meds, diet controlled as of 04/22/20  . Dyspnea    occasional  . GERD (gastroesophageal reflux disease)   . History of blood transfusion   . Hypertension   . Influenza A   . Pneumonia 04/21/2017   several times per pt on 04/22/20  . Renal disorder    dialysis m w f  . 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 He then got HD in [facility] until getting a renal transplant in 2009 at [place], [place].  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    . Smoker   . Smoker    tx wellbutriin  . Tachycardia   . Transplant recipient     Past Surgical History:  Procedure Laterality Date  . AV FISTULA PLACEMENT Right 11/02/2019   Procedure: ARTERIOVENOUS (AV) FISTULA CREATION RIGHT UPPER ARM;  Surgeon: Elam Dutch, MD;  Location: [place];  Service: Vascular;  Laterality: Right;  . BASCILIC VEIN  TRANSPOSITION Right 11/02/2019   Procedure: Right Cephalic Vein Transposition;  Surgeon: Elam Dutch, MD;  Location: [place];  Service: Vascular;  Laterality: Right;  . BIOPSY  03/31/2019   Procedure: BIOPSY;  Surgeon: Clarene Essex, MD;  Location: [place];  Service: Endoscopy;;  . CAPD REMOVAL Left 11/04/2019   Procedure: CONTINUOUS AMBULATORY PERITONEAL DIALYSIS  (CAPD) CATHETER REMOVAL;  Surgeon: Ralene Ok, MD;  Location: [place];  Service: General;  Laterality: Left;  . CHOLECYSTECTOMY    . COLONOSCOPY WITH PROPOFOL N/A 03/31/2019   Procedure: COLONOSCOPY WITH PROPOFOL;  Surgeon: Clarene Essex, MD;  Location: [place];  Service: Endoscopy;  Laterality: N/A;  . HIP SURGERY    . INSERTION OF DIALYSIS CATHETER Left 04/24/2017   Procedure: EXCHANGE  OF DIALYSIS CATHETER;  Surgeon: Rosetta Posner, MD;  Location: [place];  Service: Vascular;  Laterality: Left;  . INSERTION OF DIALYSIS CATHETER Left 11/02/2019   Procedure: INSERTION OF TUNNELED DIALYSIS CATHETER LEFT INTERNAL JUGULAR;  Surgeon: Elam Dutch, MD;  Location: [place];  Service: Vascular;  Laterality: Left;  . JOINT REPLACEMENT Right    hip  . KIDNEY TRANSPLANT Right   . PERITONEAL CATHETER INSERTION Left  08/2017  . POLYPECTOMY  03/31/2019   Procedure: POLYPECTOMY;  Surgeon: Clarene Essex, MD;  Location: [place];  Service: Endoscopy;;  . VENOGRAM N/A 11/02/2019   Procedure: Elaina Hoops;  Surgeon: Elam Dutch, MD;  Location: [facility] OR;  Service: Vascular;  Laterality: N/A;    MEDICATIONS: No current facility-administered medications for this encounter.   Marland Kitchen acetaminophen (TYLENOL) 500 MG tablet  . amLODipine (NORVASC) 10 MG tablet  . ASPIRIN LOW DOSE 81 MG EC tablet  . buPROPion (WELLBUTRIN XL) 150 MG 24 hr tablet  . Cyanocobalamin (B-12) 500 MCG TABS  . diphenhydrAMINE (BENADRYL) 25 mg capsule  . doxazosin (CARDURA) 2 MG tablet  . ferric citrate (AURYXIA) 1 GM 210 MG(Fe) tablet  . folic acid (FOLVITE) 1 MG  tablet  . gabapentin (NEURONTIN) 100 MG capsule  . lisinopril (ZESTRIL) 10 MG tablet  . loperamide (IMODIUM) 2 MG capsule  . Melatonin 5 MG CAPS  . metoprolol tartrate (LOPRESSOR) 100 MG tablet  . Multiple Vitamin (MULTIVITAMIN WITH MINERALS) TABS tablet  . omeprazole (PRILOSEC) 20 MG capsule  . predniSONE (DELTASONE) 5 MG tablet  . tacrolimus (PROGRAF) 0.5 MG capsule  . triamcinolone cream (KENALOG) 0.1 %  . valGANciclovir (VALCYTE) 450 MG tablet  . warfarin (COUMADIN) 5 MG tablet  . zolpidem (AMBIEN) 10 MG tablet  . ferrous sulfate 325 (65 FE) MG tablet  . insulin aspart (NOVOLOG FLEXPEN) 100 UNIT/ML FlexPen  . Insulin Detemir (LEVEMIR FLEXTOUCH) 100 UNIT/ML Pen  . latanoprost (XALATAN) 0.005 % ophthalmic solution    Myra Gianotti, PA-C Surgical Short Stay/Anesthesiology [facility] Phone [phone] [facility] Phone [phone] 04/22/2020 1:45 PM

## 2020-04-25 ENCOUNTER — Encounter (HOSPITAL_COMMUNITY): Payer: Self-pay | Admitting: Vascular Surgery

## 2020-04-25 ENCOUNTER — Encounter (HOSPITAL_COMMUNITY)
Admission: RE | Disposition: A | Discharge status: Home / Self Care | Payer: Self-pay | Source: Home / Self Care | Attending: Vascular Surgery

## 2020-04-25 ENCOUNTER — Other Ambulatory Visit: Payer: Self-pay

## 2020-04-25 ENCOUNTER — Ambulatory Visit (HOSPITAL_COMMUNITY)
Admission: RE | Admit: 2020-04-25 | Discharge: 2020-04-25 | Disposition: A | Discharge status: Home / Self Care | Payer: Medicare Other | Attending: Vascular Surgery | Admitting: Vascular Surgery

## 2020-04-25 ENCOUNTER — Ambulatory Visit (HOSPITAL_COMMUNITY): Payer: Medicare Other | Admitting: Vascular Surgery

## 2020-04-25 DIAGNOSIS — E1151 Type 2 diabetes mellitus with diabetic peripheral angiopathy without gangrene: Secondary | ICD-10-CM | POA: Diagnosis not present

## 2020-04-25 DIAGNOSIS — F17200 Nicotine dependence, unspecified, uncomplicated: Secondary | ICD-10-CM | POA: Diagnosis not present

## 2020-04-25 DIAGNOSIS — Z992 Dependence on renal dialysis: Secondary | ICD-10-CM | POA: Insufficient documentation

## 2020-04-25 DIAGNOSIS — E1122 Type 2 diabetes mellitus with diabetic chronic kidney disease: Secondary | ICD-10-CM | POA: Insufficient documentation

## 2020-04-25 DIAGNOSIS — Z940 Kidney transplant status: Secondary | ICD-10-CM | POA: Diagnosis not present

## 2020-04-25 DIAGNOSIS — Z7952 Long term (current) use of systemic steroids: Secondary | ICD-10-CM | POA: Diagnosis not present

## 2020-04-25 DIAGNOSIS — D631 Anemia in chronic kidney disease: Secondary | ICD-10-CM | POA: Insufficient documentation

## 2020-04-25 DIAGNOSIS — K219 Gastro-esophageal reflux disease without esophagitis: Secondary | ICD-10-CM | POA: Diagnosis not present

## 2020-04-25 DIAGNOSIS — Z79899 Other long term (current) drug therapy: Secondary | ICD-10-CM | POA: Diagnosis not present

## 2020-04-25 DIAGNOSIS — T82898A Other specified complication of vascular prosthetic devices, implants and grafts, initial encounter: Secondary | ICD-10-CM | POA: Insufficient documentation

## 2020-04-25 DIAGNOSIS — I120 Hypertensive chronic kidney disease with stage 5 chronic kidney disease or end stage renal disease: Secondary | ICD-10-CM | POA: Diagnosis not present

## 2020-04-25 DIAGNOSIS — Z20822 Contact with and (suspected) exposure to covid-19: Secondary | ICD-10-CM | POA: Diagnosis not present

## 2020-04-25 DIAGNOSIS — N186 End stage renal disease: Secondary | ICD-10-CM | POA: Diagnosis present

## 2020-04-25 DIAGNOSIS — Z794 Long term (current) use of insulin: Secondary | ICD-10-CM | POA: Insufficient documentation

## 2020-04-25 DIAGNOSIS — Z9049 Acquired absence of other specified parts of digestive tract: Secondary | ICD-10-CM | POA: Insufficient documentation

## 2020-04-25 DIAGNOSIS — Z7901 Long term (current) use of anticoagulants: Secondary | ICD-10-CM | POA: Diagnosis not present

## 2020-04-25 DIAGNOSIS — N185 Chronic kidney disease, stage 5: Secondary | ICD-10-CM

## 2020-04-25 DIAGNOSIS — X58XXXA Exposure to other specified factors, initial encounter: Secondary | ICD-10-CM | POA: Insufficient documentation

## 2020-04-25 HISTORY — DX: Other complications of anesthesia, initial encounter: T88.59XA

## 2020-04-25 HISTORY — DX: Peripheral vascular disease, unspecified: I73.9

## 2020-04-25 HISTORY — PX: AV FISTULA PLACEMENT: SHX1204

## 2020-04-25 HISTORY — DX: Gastro-esophageal reflux disease without esophagitis: K21.9

## 2020-04-25 HISTORY — DX: Personal history of other medical treatment: Z92.89

## 2020-04-25 LAB — POCT I-STAT, CHEM 8
BUN: 46 mg/dL — ABNORMAL HIGH (ref 6–20)
Calcium, Ion: 1.11 mmol/L — ABNORMAL LOW (ref 1.15–1.40)
Chloride: 102 mmol/L (ref 98–111)
Creatinine, Ser: 17.6 mg/dL — ABNORMAL HIGH (ref 0.61–1.24)
Glucose, Bld: 80 mg/dL (ref 70–99)
HCT: 30 % — ABNORMAL LOW (ref 39.0–52.0)
Hemoglobin: 10.2 g/dL — ABNORMAL LOW (ref 13.0–17.0)
Potassium: 3.9 mmol/L (ref 3.5–5.1)
Sodium: 140 mmol/L (ref 135–145)
TCO2: 21 mmol/L — ABNORMAL LOW (ref 22–32)

## 2020-04-25 LAB — GLUCOSE, CAPILLARY
Glucose-Capillary: 89 mg/dL (ref 70–99)
Glucose-Capillary: 110 mg/dL — ABNORMAL HIGH (ref 70–99)
Glucose-Capillary: 67 mg/dL — ABNORMAL LOW (ref 70–99)

## 2020-04-25 LAB — PROTIME-INR
INR: 1.1 (ref 0.8–1.2)
Prothrombin Time: 13.9 seconds (ref 11.4–15.2)

## 2020-04-25 LAB — SARS CORONAVIRUS 2 BY RT PCR (HOSPITAL ORDER, PERFORMED IN ~~LOC~~ HOSPITAL LAB): SARS Coronavirus 2: NEGATIVE

## 2020-04-25 SURGERY — INSERTION OF ARTERIOVENOUS (AV) GORE-TEX GRAFT ARM
Anesthesia: Monitor Anesthesia Care | Laterality: Right

## 2020-04-25 MED ORDER — CHLORHEXIDINE GLUCONATE 0.12 % MT SOLN: 15.0000 mL | Freq: Once | OROMUCOSAL | Status: AC

## 2020-04-25 MED ORDER — CHLORHEXIDINE GLUCONATE 4 % EX LIQD: 60.0000 mL | Freq: Once | CUTANEOUS | Status: DC

## 2020-04-25 MED ORDER — HEPARIN SODIUM (PORCINE) 1000 UNIT/ML IJ SOLN
INTRAMUSCULAR | Status: DC | PRN
  Administered 2020-04-25: 5000 [IU] via INTRAVENOUS

## 2020-04-25 MED ORDER — PROTAMINE SULFATE 10 MG/ML IV SOLN
INTRAVENOUS | Status: DC | PRN
  Administered 2020-04-25: 20 mg via INTRAVENOUS
  Administered 2020-04-25: 10 mg via INTRAVENOUS
  Administered 2020-04-25: 20 mg via INTRAVENOUS

## 2020-04-25 MED ORDER — ORAL CARE MOUTH RINSE: 15.0000 mL | Freq: Once | OROMUCOSAL | Status: AC

## 2020-04-25 MED ORDER — HEMOSTATIC AGENTS (NO CHARGE) OPTIME
TOPICAL | Status: DC | PRN
  Administered 2020-04-25 (×2): 1 via TOPICAL

## 2020-04-25 MED ORDER — DEXAMETHASONE SODIUM PHOSPHATE 10 MG/ML IJ SOLN
INTRAMUSCULAR | Status: DC | PRN
  Administered 2020-04-25: 5 mg via INTRAVENOUS

## 2020-04-25 MED ORDER — MIDAZOLAM HCL 2 MG/2ML IJ SOLN
INTRAMUSCULAR | Status: AC
  Filled 2020-04-25: qty 2

## 2020-04-25 MED ORDER — FENTANYL CITRATE (PF) 100 MCG/2ML IJ SOLN
INTRAMUSCULAR | Status: DC | PRN
  Administered 2020-04-25 (×2): 25 ug via INTRAVENOUS

## 2020-04-25 MED ORDER — HEPARIN SODIUM (PORCINE) 1000 UNIT/ML IJ SOLN
INTRAMUSCULAR | Status: AC
  Filled 2020-04-25: qty 2

## 2020-04-25 MED ORDER — LIDOCAINE 2% (20 MG/ML) 5 ML SYRINGE
INTRAMUSCULAR | Status: AC
  Filled 2020-04-25: qty 5

## 2020-04-25 MED ORDER — 0.9 % SODIUM CHLORIDE (POUR BTL) OPTIME
TOPICAL | Status: DC | PRN
  Administered 2020-04-25: 1000 mL

## 2020-04-25 MED ORDER — LIDOCAINE HCL (PF) 1 % IJ SOLN
INTRAMUSCULAR | Status: DC | PRN
  Administered 2020-04-25: 42 mL

## 2020-04-25 MED ORDER — SODIUM CHLORIDE 0.9 % IV SOLN
INTRAVENOUS | Status: AC
  Filled 2020-04-25: qty 1.2

## 2020-04-25 MED ORDER — CEFAZOLIN SODIUM-DEXTROSE 2-4 GM/100ML-% IV SOLN
2.0000 g | INTRAVENOUS | Status: AC
  Administered 2020-04-25: 2 g via INTRAVENOUS
  Filled 2020-04-25: qty 100

## 2020-04-25 MED ORDER — PROTAMINE SULFATE 10 MG/ML IV SOLN
INTRAVENOUS | Status: AC
  Filled 2020-04-25: qty 10

## 2020-04-25 MED ORDER — FENTANYL CITRATE (PF) 250 MCG/5ML IJ SOLN
INTRAMUSCULAR | Status: AC
  Filled 2020-04-25: qty 5

## 2020-04-25 MED ORDER — PHENYLEPHRINE 40 MCG/ML (10ML) SYRINGE FOR IV PUSH (FOR BLOOD PRESSURE SUPPORT)
PREFILLED_SYRINGE | INTRAVENOUS | Status: AC
  Filled 2020-04-25: qty 10

## 2020-04-25 MED ORDER — SODIUM CHLORIDE 0.9 % IV SOLN: INTRAVENOUS | Status: DC

## 2020-04-25 MED ORDER — PHENYLEPHRINE HCL-NACL 10-0.9 MG/250ML-% IV SOLN
INTRAVENOUS | Status: DC | PRN
  Administered 2020-04-25: 40 ug/min via INTRAVENOUS

## 2020-04-25 MED ORDER — LIDOCAINE HCL (PF) 1 % IJ SOLN
INTRAMUSCULAR | Status: AC
  Filled 2020-04-25: qty 60

## 2020-04-25 MED ORDER — PROPOFOL 500 MG/50ML IV EMUL
INTRAVENOUS | Status: DC | PRN
  Administered 2020-04-25: 50 ug/kg/min via INTRAVENOUS
  Administered 2020-04-25: 25 ug/kg/min via INTRAVENOUS

## 2020-04-25 MED ORDER — MIDAZOLAM HCL 5 MG/5ML IJ SOLN
INTRAMUSCULAR | Status: DC | PRN
  Administered 2020-04-25: 1 mg via INTRAVENOUS

## 2020-04-25 MED ORDER — CHLORHEXIDINE GLUCONATE 0.12 % MT SOLN
OROMUCOSAL | Status: AC
  Administered 2020-04-25: 15 mL
  Filled 2020-04-25: qty 15

## 2020-04-25 MED ORDER — OXYCODONE-ACETAMINOPHEN 5-325 MG PO TABS: 1.0000 | ORAL_TABLET | Freq: Four times a day (QID) | ORAL | 0 refills | Status: DC | PRN

## 2020-04-25 MED ORDER — PHENYLEPHRINE 40 MCG/ML (10ML) SYRINGE FOR IV PUSH (FOR BLOOD PRESSURE SUPPORT)
PREFILLED_SYRINGE | INTRAVENOUS | Status: DC | PRN
  Administered 2020-04-25: 80 ug via INTRAVENOUS
  Administered 2020-04-25: 120 ug via INTRAVENOUS
  Administered 2020-04-25: 80 ug via INTRAVENOUS

## 2020-04-25 MED ORDER — SODIUM CHLORIDE 0.9 % IV SOLN: INTRAVENOUS | Status: DC | PRN

## 2020-04-25 MED ORDER — LIDOCAINE HCL (CARDIAC) PF 100 MG/5ML IV SOSY
PREFILLED_SYRINGE | INTRAVENOUS | Status: DC | PRN
  Administered 2020-04-25: 40 mg via INTRAVENOUS

## 2020-04-25 SURGICAL SUPPLY — 42 items
AGENT HMST SPONGE THK3/8 (HEMOSTASIS) ×2
ARMBAND PINK RESTRICT EXTREMIT (MISCELLANEOUS) ×4 IMPLANT
CANISTER SUCT 3000ML PPV (MISCELLANEOUS) ×2 IMPLANT
CANNULA VESSEL 3MM 2 BLNT TIP (CANNULA) ×2 IMPLANT
CLIP VESOCCLUDE MED 6/CT (CLIP) ×2 IMPLANT
CLIP VESOCCLUDE SM WIDE 6/CT (CLIP) ×2 IMPLANT
COVER WAND RF STERILE (DRAPES) ×2 IMPLANT
DECANTER SPIKE VIAL GLASS SM (MISCELLANEOUS) ×4 IMPLANT
DERMABOND ADVANCED (GAUZE/BANDAGES/DRESSINGS) ×1
DERMABOND ADVANCED .7 DNX12 (GAUZE/BANDAGES/DRESSINGS) ×1 IMPLANT
ELECT REM PT RETURN 9FT ADLT (ELECTROSURGICAL) ×2
ELECTRODE REM PT RTRN 9FT ADLT (ELECTROSURGICAL) ×1 IMPLANT
GAUZE 4X4 16PLY RFD (DISPOSABLE) ×2 IMPLANT
GAUZE SPONGE 4X4 12PLY STRL (GAUZE/BANDAGES/DRESSINGS) ×2 IMPLANT
GLOVE BIO SURGEON STRL SZ7.5 (GLOVE) ×2 IMPLANT
GLOVE BIOGEL PI IND STRL 6.5 (GLOVE) ×2 IMPLANT
GLOVE BIOGEL PI INDICATOR 6.5 (GLOVE) ×2
GLOVE ECLIPSE 7.5 STRL STRAW (GLOVE) ×2 IMPLANT
GLOVE SURG SS PI 7.0 STRL IVOR (GLOVE) ×2 IMPLANT
GOWN STRL REUS W/ TWL LRG LVL3 (GOWN DISPOSABLE) ×4 IMPLANT
GOWN STRL REUS W/TWL LRG LVL3 (GOWN DISPOSABLE) ×8
GRAFT GORETEX STRT 4-7X45 (Vascular Products) ×2 IMPLANT
HEMOSTAT SPONGE AVITENE ULTRA (HEMOSTASIS) ×4 IMPLANT
KIT BASIN OR (CUSTOM PROCEDURE TRAY) ×2 IMPLANT
KIT TURNOVER KIT B (KITS) ×2 IMPLANT
NEEDLE HYPO 25GX1X1/2 BEV (NEEDLE) ×2 IMPLANT
NS IRRIG 1000ML POUR BTL (IV SOLUTION) ×2 IMPLANT
PACK CV ACCESS (CUSTOM PROCEDURE TRAY) IMPLANT
PACK PERIPHERAL VASCULAR (CUSTOM PROCEDURE TRAY) ×2 IMPLANT
PAD ARMBOARD 7.5X6 YLW CONV (MISCELLANEOUS) ×4 IMPLANT
STOCKINETTE 6  STRL (DRAPES) ×2
STOCKINETTE 6 STRL (DRAPES) ×1 IMPLANT
SUT PROLENE 6 0 CC (SUTURE) ×4 IMPLANT
SUT VIC AB 3-0 SH 27 (SUTURE) ×4
SUT VIC AB 3-0 SH 27X BRD (SUTURE) ×2 IMPLANT
SUT VICRYL 4-0 PS2 18IN ABS (SUTURE) ×4 IMPLANT
SYR BULB IRRIG 60ML STRL (SYRINGE) ×2 IMPLANT
SYR CONTROL 10ML LL (SYRINGE) ×2 IMPLANT
SYR TOOMEY 50ML (SYRINGE) IMPLANT
TOWEL GREEN STERILE (TOWEL DISPOSABLE) ×2 IMPLANT
UNDERPAD 30X36 HEAVY ABSORB (UNDERPADS AND DIAPERS) ×2 IMPLANT
WATER STERILE IRR 1000ML POUR (IV SOLUTION) ×2 IMPLANT

## 2020-04-25 NOTE — Op Note (Signed)
Procedure: Right Upper Arm AV graft  Preop: ESRD  Postop: ESRD  Anesthesia: Local with sedation  Findings:4-7 mm PTFE end to side to axillary vein  Asst: Leontine Locket PA-C   Procedure Details: After adequate sedation, the right upper extremity was prepped and draped in usual sterile fashion. Local anesthesia was infiltrated in the antecubital crease and axilla.   A longitudinal incision was then made near the antecubital crease the right arm.  There were no suitable antecubital veins for outflow.   The incision was carried into the subcutaneous tissues down to level of the brachial artery.  There was some scar in this area from prior brachiocephalic fistula.   Next the brachial artery was dissected free in the medial portion incision. The artery was  3 mm in diameter. The vessel loops were placed proximal and distal to the planned site of arteriotomy. At this point, a longitudinal incision was made in the axilla and carried through the subcutaneous tissues and fascia to expose the axillary vein.  The nerves were protected.  The vein was approximately 4-5 mm in diameter. Next, a subcutaneous tunnel was created connecting the upper arm to the lower arm incision in an arcing configuration over the biceps muscle.  A 4-7 mm PTFE graft was then brought through this subcutaneous tunnel.  There was some tunnel bleeding which was controlled with direct pressure. The patient was given 5000 units of intravenous heparin. After appropriate circulation time, the vessel loops were used to control the artery. A longitudinal opening was made in the right brachial artery.  The 4 mm end of the graft was beveled and sewn end to side to the artery using a 6 0 prolene.  At completion of the anastomosis the artery was forward bled, backbled and thoroughly flushed.  The anastomosis was secured, vessel loops were released and there was palpable pulse in the graft.  The graft was clamped just above the arterial anastomosis  with a fistula clamp. The graft was then pulled taut to length at the axillary incision.  The axillary vein was controlled with a fine bulldog clamp in the upper axilla proximally and distally.  The vein was opened longitudinally.  The distal end of the graft was then beveled and sewn end to side to the vein using a running 6 0 prolene.  Just prior to completion of the anastomosis, everything was forward bled, back bled and thoroughly flushed.  The anastomosis was secured and the fistula clamp removed from the proximal graft.  The pt had good radial and ulnar doppler flow.  A thrill was immediately palpable in the graft.  After hemostasis was obtained, the subcutaneous tissues were reapproximated using a running 3-0 Vicryl suture. The skin was then closed with a 4 0 Vicryl subcuticular stitch. Dermabond was applied to the skin incisions.  The patient tolerated the procedure well and there were no complications.  Instrument sponge and needle count was correct at the end of the case.  The patient was taken to the recovery room in stable condition.   Ruta Hinds, MD [facility] Office: [phone] Pager: [phone]

## 2020-04-25 NOTE — Discharge Instructions (Signed)
[facility]  Discharge Instructions  AV Fistula or Graft Surgery for Dialysis Access  Please refer to the following instructions for your post-procedure care. Your surgeon or physician assistant will discuss any changes with you.  Activity  You may drive the day following your surgery, if you are comfortable and no longer taking prescription pain medication. Resume full activity as the soreness in your incision resolves.  Bathing/Showering  You may shower after you go home. Keep your incision dry for 48 hours. Do not soak in a bathtub, hot tub, or swim until the incision heals completely. You may not shower if you have a hemodialysis catheter.  Incision Care  Clean your incision with mild soap and water after 48 hours. Pat the area dry with a clean towel. You do not need a bandage unless otherwise instructed. Do not apply any ointments or creams to your incision. You may have skin glue on your incision. Do not peel it off. It will come off on its own in about one week. Your arm may swell a bit after surgery. To reduce swelling use pillows to elevate your arm so it is above your heart. Your doctor will tell you if you need to lightly wrap your arm with an ACE bandage.  Diet  Resume your normal diet. There are not special food restrictions following this procedure. In order to heal from your surgery, it is CRITICAL to get adequate nutrition. Your body requires vitamins, minerals, and protein. Vegetables are the best source of vitamins and minerals. Vegetables also provide the perfect balance of protein. Processed food has little nutritional value, so try to avoid this.  Medications  Resume taking all of your medications. If your incision is causing pain, you may take over-the counter pain relievers such as acetaminophen (Tylenol). If you were prescribed a stronger pain medication, please be aware these medications can cause nausea and constipation. Prevent  nausea by taking the medication with a snack or meal. Avoid constipation by drinking plenty of fluids and eating foods with high amount of fiber, such as fruits, vegetables, and grains.  Do not take Tylenol if you are taking prescription pain medications.   RESUME YOUR REGULAR COUMADIN DOSE ON Wednesday April 27, 2020.   Follow up Your surgeon may want to see you in the office following your access surgery. If so, this will be arranged at the time of your surgery.  Please call us immediately for any of the following conditions:  . Increased pain, redness, drainage (pus) from your incision site . Fever of 101 degrees or higher . Severe or worsening pain at your incision site . Hand pain or numbness. .  Reduce your risk of vascular disease:  . Stop smoking. If you would like help, call [facility] at 1-800-QUIT-NOW 573-428-1889) or [place] at (706) 316-7537  . Manage your cholesterol . Maintain a desired weight . Control your diabetes . Keep your blood pressure down  Dialysis  It will take several weeks to several months for your new dialysis access to be ready for use. Your surgeon will determine when it is okay to use it. Your nephrologist will continue to direct your dialysis. You can continue to use your Permcath until your new access is ready for use.   04/25/2020 [place] [id number] [birth year]  Surgeon(s): Fields, Jessy Oto, MD  Procedure(s): INSERTION OF ARTERIOVENOUS (AV) GORE-TEX GRAFT ARM  x Do not stick graft for 4 weeks    If you have  any questions, please call the office at [phone].

## 2020-04-25 NOTE — H&P (Signed)
Patient is a 49 year old male who returns for follow-up today after placement of a right brachiocephalic AV fistula.  The fistula was placed November 02, 2019.  It was a fairly small vein.  He also had a left internal jugular vein palindrome catheter placed simultaneously.  He has no narrowing of the left internal jugular vein innominate junction.  He has no symptoms of steal in his right hand.  His hemodialysis today is Tuesday Thursday Saturday.  He previously had a left upper arm AV still which has now failed.  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  Assessment: Nonmaturing AV fistula right arm.  Patient will need a right upper arm AV graft.  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  We will need to stop this perioperatively.  We will stop it 3 days prior and then resume postoperatively.  Risk benefits possible complications and procedure details including not limited to bleeding infection graft thrombosis ischemic steal were discussed the patient today.  He understands and agrees to proceed.   Ruta Hinds, MD [facility] Office: [phone]

## 2020-04-25 NOTE — Progress Notes (Signed)
PDMP aware reviewed and narcotic prescribed.  James [name], [facility] 04/25/2020 11:49 AM

## 2020-04-25 NOTE — Transfer of Care (Signed)
Immediate Anesthesia Transfer of Care Note  Patient: James [name]  Procedure(s) Performed: INSERTION OF ARTERIOVENOUS (AV) GORE-TEX GRAFT ARM (Right )  Patient Location: PACU  Anesthesia Type:MAC  Level of Consciousness: awake, alert  and oriented  Airway & Oxygen Therapy: Patient Spontanous Breathing  Post-op Assessment: Report given to RN and Post -op Vital signs reviewed and stable  Post vital signs: Reviewed and stable  Last Vitals:  Vitals Value Taken Time  BP 127/47 04/25/20 1151  Temp    Pulse 87 04/25/20 1155  Resp 18 04/25/20 1155  SpO2 99 % 04/25/20 1155  Vitals shown include unvalidated device data.  Last Pain:  Vitals:   04/25/20 0821  PainSc: 0-No pain         Complications: No complications documented.

## 2020-04-25 NOTE — Progress Notes (Signed)
Hypoglycemic Event  CBG: 67 (@1152 )  Treatment: 4 oz juice/soda  Symptoms: None  Follow-up CBG: Time:1212 CBG Result:110  Possible Reasons for Event: Inadequate meal intake, NPO for surgery    Abhishek Levesque, Jacinto Halim

## 2020-04-25 NOTE — Anesthesia Postprocedure Evaluation (Signed)
Anesthesia Post Note  Patient: James [name]  Procedure(s) Performed: INSERTION OF ARTERIOVENOUS (AV) GORE-TEX GRAFT ARM (Right )     Patient location during evaluation: PACU Anesthesia Type: MAC Level of consciousness: awake and alert Pain management: pain level controlled Vital Signs Assessment: post-procedure vital signs reviewed and stable Respiratory status: spontaneous breathing, nonlabored ventilation and respiratory function stable Cardiovascular status: stable and blood pressure returned to baseline Anesthetic complications: no   No complications documented.  Last Vitals:  Vitals:   04/25/20 1236 04/25/20 1251  BP: 127/85 131/64  Pulse: 80 80  Resp: (!) 21 18  Temp:  36.5 C  SpO2: 100% 100%    Last Pain:  Vitals:   04/25/20 1251  PainSc: 0-No pain                 Audry Pili

## 2020-04-25 NOTE — Progress Notes (Signed)
Patient discharging home via [employer], sister Denman George will be at home and stay with him post-operatively.

## 2020-04-26 ENCOUNTER — Encounter (HOSPITAL_COMMUNITY): Payer: Self-pay | Admitting: Vascular Surgery

## 2020-08-01 ENCOUNTER — Emergency Department (HOSPITAL_COMMUNITY): Payer: Medicare Other

## 2020-08-01 ENCOUNTER — Encounter (HOSPITAL_COMMUNITY): Payer: Self-pay | Admitting: Physician Assistant

## 2020-08-01 ENCOUNTER — Inpatient Hospital Stay (HOSPITAL_COMMUNITY)
Admission: EM | Admit: 2020-08-01 | Discharge: 2020-08-10 | DRG: 280 | Disposition: A | Discharge status: Home Health | Payer: Medicare Other | Attending: Internal Medicine | Admitting: Internal Medicine

## 2020-08-01 ENCOUNTER — Other Ambulatory Visit: Payer: Self-pay | Admitting: Physician Assistant

## 2020-08-01 ENCOUNTER — Inpatient Hospital Stay (HOSPITAL_COMMUNITY): Payer: Medicare Other

## 2020-08-01 DIAGNOSIS — N2581 Secondary hyperparathyroidism of renal origin: Secondary | ICD-10-CM | POA: Diagnosis present

## 2020-08-01 DIAGNOSIS — E872 Acidosis: Secondary | ICD-10-CM | POA: Diagnosis present

## 2020-08-01 DIAGNOSIS — K644 Residual hemorrhoidal skin tags: Secondary | ICD-10-CM | POA: Diagnosis present

## 2020-08-01 DIAGNOSIS — T8612 Kidney transplant failure: Secondary | ICD-10-CM | POA: Diagnosis present

## 2020-08-01 DIAGNOSIS — I214 Non-ST elevation (NSTEMI) myocardial infarction: Secondary | ICD-10-CM

## 2020-08-01 DIAGNOSIS — E875 Hyperkalemia: Secondary | ICD-10-CM | POA: Diagnosis present

## 2020-08-01 DIAGNOSIS — R079 Chest pain, unspecified: Secondary | ICD-10-CM

## 2020-08-01 DIAGNOSIS — I340 Nonrheumatic mitral (valve) insufficiency: Secondary | ICD-10-CM | POA: Diagnosis present

## 2020-08-01 DIAGNOSIS — Y830 Surgical operation with transplant of whole organ as the cause of abnormal reaction of the patient, or of later complication, without mention of misadventure at the time of the procedure: Secondary | ICD-10-CM | POA: Diagnosis present

## 2020-08-01 DIAGNOSIS — Z9119 Patient's noncompliance with other medical treatment and regimen: Secondary | ICD-10-CM

## 2020-08-01 DIAGNOSIS — I21A1 Myocardial infarction type 2: Secondary | ICD-10-CM | POA: Diagnosis present

## 2020-08-01 DIAGNOSIS — J9601 Acute respiratory failure with hypoxia: Secondary | ICD-10-CM | POA: Diagnosis present

## 2020-08-01 DIAGNOSIS — M8619 Other acute osteomyelitis, multiple sites: Secondary | ICD-10-CM | POA: Diagnosis not present

## 2020-08-01 DIAGNOSIS — K219 Gastro-esophageal reflux disease without esophagitis: Secondary | ICD-10-CM | POA: Diagnosis present

## 2020-08-01 DIAGNOSIS — R9431 Abnormal electrocardiogram [ECG] [EKG]: Secondary | ICD-10-CM | POA: Insufficient documentation

## 2020-08-01 DIAGNOSIS — Z86010 Personal history of colonic polyps: Secondary | ICD-10-CM

## 2020-08-01 DIAGNOSIS — T8744 Infection of amputation stump, left lower extremity: Secondary | ICD-10-CM | POA: Diagnosis present

## 2020-08-01 DIAGNOSIS — D649 Anemia, unspecified: Secondary | ICD-10-CM | POA: Diagnosis not present

## 2020-08-01 DIAGNOSIS — L02612 Cutaneous abscess of left foot: Secondary | ICD-10-CM | POA: Diagnosis present

## 2020-08-01 DIAGNOSIS — D47Z1 Post-transplant lymphoproliferative disorder (PTLD): Secondary | ICD-10-CM | POA: Diagnosis present

## 2020-08-01 DIAGNOSIS — E8779 Other fluid overload: Secondary | ICD-10-CM | POA: Diagnosis present

## 2020-08-01 DIAGNOSIS — T447X6A Underdosing of beta-adrenoreceptor antagonists, initial encounter: Secondary | ICD-10-CM | POA: Diagnosis present

## 2020-08-01 DIAGNOSIS — M86172 Other acute osteomyelitis, left ankle and foot: Secondary | ICD-10-CM | POA: Diagnosis present

## 2020-08-01 DIAGNOSIS — I420 Dilated cardiomyopathy: Secondary | ICD-10-CM | POA: Diagnosis present

## 2020-08-01 DIAGNOSIS — Z8679 Personal history of other diseases of the circulatory system: Secondary | ICD-10-CM

## 2020-08-01 DIAGNOSIS — I5020 Unspecified systolic (congestive) heart failure: Secondary | ICD-10-CM

## 2020-08-01 DIAGNOSIS — Z886 Allergy status to analgesic agent status: Secondary | ICD-10-CM

## 2020-08-01 DIAGNOSIS — R195 Other fecal abnormalities: Secondary | ICD-10-CM | POA: Diagnosis present

## 2020-08-01 DIAGNOSIS — Z89511 Acquired absence of right leg below knee: Secondary | ICD-10-CM

## 2020-08-01 DIAGNOSIS — Z8572 Personal history of non-Hodgkin lymphomas: Secondary | ICD-10-CM

## 2020-08-01 DIAGNOSIS — N186 End stage renal disease: Secondary | ICD-10-CM | POA: Diagnosis present

## 2020-08-01 DIAGNOSIS — Z9049 Acquired absence of other specified parts of digestive tract: Secondary | ICD-10-CM

## 2020-08-01 DIAGNOSIS — G9341 Metabolic encephalopathy: Secondary | ICD-10-CM | POA: Diagnosis present

## 2020-08-01 DIAGNOSIS — Z9115 Patient's noncompliance with renal dialysis: Secondary | ICD-10-CM

## 2020-08-01 DIAGNOSIS — Z89432 Acquired absence of left foot: Secondary | ICD-10-CM

## 2020-08-01 DIAGNOSIS — R000 Tachycardia, unspecified: Secondary | ICD-10-CM | POA: Diagnosis not present

## 2020-08-01 DIAGNOSIS — Z96641 Presence of right artificial hip joint: Secondary | ICD-10-CM | POA: Diagnosis present

## 2020-08-01 DIAGNOSIS — K648 Other hemorrhoids: Secondary | ICD-10-CM | POA: Diagnosis present

## 2020-08-01 DIAGNOSIS — Z940 Kidney transplant status: Secondary | ICD-10-CM

## 2020-08-01 DIAGNOSIS — K635 Polyp of colon: Secondary | ICD-10-CM | POA: Diagnosis present

## 2020-08-01 DIAGNOSIS — H409 Unspecified glaucoma: Secondary | ICD-10-CM | POA: Diagnosis present

## 2020-08-01 DIAGNOSIS — R778 Other specified abnormalities of plasma proteins: Secondary | ICD-10-CM

## 2020-08-01 DIAGNOSIS — I70203 Unspecified atherosclerosis of native arteries of extremities, bilateral legs: Secondary | ICD-10-CM | POA: Diagnosis present

## 2020-08-01 DIAGNOSIS — I132 Hypertensive heart and chronic kidney disease with heart failure and with stage 5 chronic kidney disease, or end stage renal disease: Principal | ICD-10-CM | POA: Diagnosis present

## 2020-08-01 DIAGNOSIS — I160 Hypertensive urgency: Secondary | ICD-10-CM | POA: Diagnosis present

## 2020-08-01 DIAGNOSIS — E1151 Type 2 diabetes mellitus with diabetic peripheral angiopathy without gangrene: Secondary | ICD-10-CM | POA: Diagnosis present

## 2020-08-01 DIAGNOSIS — Z7901 Long term (current) use of anticoagulants: Secondary | ICD-10-CM

## 2020-08-01 DIAGNOSIS — E1169 Type 2 diabetes mellitus with other specified complication: Secondary | ICD-10-CM | POA: Diagnosis present

## 2020-08-01 DIAGNOSIS — I5041 Acute combined systolic (congestive) and diastolic (congestive) heart failure: Secondary | ICD-10-CM | POA: Diagnosis present

## 2020-08-01 DIAGNOSIS — D509 Iron deficiency anemia, unspecified: Secondary | ICD-10-CM | POA: Diagnosis not present

## 2020-08-01 DIAGNOSIS — E876 Hypokalemia: Secondary | ICD-10-CM | POA: Diagnosis not present

## 2020-08-01 DIAGNOSIS — J9690 Respiratory failure, unspecified, unspecified whether with hypoxia or hypercapnia: Secondary | ICD-10-CM | POA: Diagnosis present

## 2020-08-01 DIAGNOSIS — Z7952 Long term (current) use of systemic steroids: Secondary | ICD-10-CM

## 2020-08-01 DIAGNOSIS — I2510 Atherosclerotic heart disease of native coronary artery without angina pectoris: Secondary | ICD-10-CM | POA: Diagnosis present

## 2020-08-01 DIAGNOSIS — J9621 Acute and chronic respiratory failure with hypoxia: Secondary | ICD-10-CM

## 2020-08-01 DIAGNOSIS — Z885 Allergy status to narcotic agent status: Secondary | ICD-10-CM

## 2020-08-01 DIAGNOSIS — K5903 Drug induced constipation: Secondary | ICD-10-CM | POA: Diagnosis present

## 2020-08-01 DIAGNOSIS — E1122 Type 2 diabetes mellitus with diabetic chronic kidney disease: Secondary | ICD-10-CM | POA: Diagnosis present

## 2020-08-01 DIAGNOSIS — Z8546 Personal history of malignant neoplasm of prostate: Secondary | ICD-10-CM

## 2020-08-01 DIAGNOSIS — I5043 Acute on chronic combined systolic (congestive) and diastolic (congestive) heart failure: Secondary | ICD-10-CM | POA: Diagnosis not present

## 2020-08-01 DIAGNOSIS — Z992 Dependence on renal dialysis: Secondary | ICD-10-CM

## 2020-08-01 DIAGNOSIS — Z79899 Other long term (current) drug therapy: Secondary | ICD-10-CM

## 2020-08-01 DIAGNOSIS — G9340 Encephalopathy, unspecified: Secondary | ICD-10-CM | POA: Diagnosis not present

## 2020-08-01 DIAGNOSIS — Z9079 Acquired absence of other genital organ(s): Secondary | ICD-10-CM

## 2020-08-01 DIAGNOSIS — Z794 Long term (current) use of insulin: Secondary | ICD-10-CM

## 2020-08-01 DIAGNOSIS — Z20822 Contact with and (suspected) exposure to covid-19: Secondary | ICD-10-CM | POA: Diagnosis present

## 2020-08-01 DIAGNOSIS — J810 Acute pulmonary edema: Secondary | ICD-10-CM | POA: Diagnosis not present

## 2020-08-01 DIAGNOSIS — N400 Enlarged prostate without lower urinary tract symptoms: Secondary | ICD-10-CM | POA: Diagnosis present

## 2020-08-01 DIAGNOSIS — D72829 Elevated white blood cell count, unspecified: Secondary | ICD-10-CM | POA: Diagnosis present

## 2020-08-01 DIAGNOSIS — K2970 Gastritis, unspecified, without bleeding: Secondary | ICD-10-CM | POA: Diagnosis not present

## 2020-08-01 DIAGNOSIS — K5730 Diverticulosis of large intestine without perforation or abscess without bleeding: Secondary | ICD-10-CM | POA: Diagnosis present

## 2020-08-01 DIAGNOSIS — D631 Anemia in chronic kidney disease: Secondary | ICD-10-CM | POA: Diagnosis present

## 2020-08-01 DIAGNOSIS — K2980 Duodenitis without bleeding: Secondary | ICD-10-CM | POA: Diagnosis present

## 2020-08-01 DIAGNOSIS — Z9181 History of falling: Secondary | ICD-10-CM

## 2020-08-01 DIAGNOSIS — T8789 Other complications of amputation stump: Secondary | ICD-10-CM | POA: Diagnosis not present

## 2020-08-01 DIAGNOSIS — G8929 Other chronic pain: Secondary | ICD-10-CM | POA: Diagnosis present

## 2020-08-01 DIAGNOSIS — I10 Essential (primary) hypertension: Secondary | ICD-10-CM | POA: Diagnosis not present

## 2020-08-01 DIAGNOSIS — F17210 Nicotine dependence, cigarettes, uncomplicated: Secondary | ICD-10-CM | POA: Diagnosis present

## 2020-08-01 DIAGNOSIS — I739 Peripheral vascular disease, unspecified: Secondary | ICD-10-CM | POA: Diagnosis not present

## 2020-08-01 DIAGNOSIS — E781 Pure hyperglyceridemia: Secondary | ICD-10-CM | POA: Diagnosis present

## 2020-08-01 DIAGNOSIS — Z7982 Long term (current) use of aspirin: Secondary | ICD-10-CM

## 2020-08-01 HISTORY — DX: Pure hyperglyceridemia: E78.1

## 2020-08-01 HISTORY — DX: Neoplasm of uncertain behavior of lymphoid, hematopoietic and related tissue, unspecified: D47.9

## 2020-08-01 HISTORY — DX: End stage renal disease: N18.6

## 2020-08-01 HISTORY — DX: Malignant neoplasm of prostate: C61

## 2020-08-01 HISTORY — DX: Abnormal electrocardiogram (ECG) (EKG): R94.31

## 2020-08-01 HISTORY — DX: Benign prostatic hyperplasia without lower urinary tract symptoms: N40.0

## 2020-08-01 HISTORY — DX: Type 2 diabetes mellitus without complications: E11.9

## 2020-08-01 HISTORY — DX: Idiopathic aseptic necrosis of unspecified bone: M87.00

## 2020-08-01 HISTORY — DX: Thrombocytopenia, unspecified: D69.6

## 2020-08-01 HISTORY — DX: Unspecified glaucoma: H40.9

## 2020-08-01 LAB — I-STAT CHEM 8, ED
BUN: 66 mg/dL — ABNORMAL HIGH (ref 6–20)
Calcium, Ion: 1.17 mmol/L (ref 1.15–1.40)
Chloride: 100 mmol/L (ref 98–111)
Creatinine, Ser: 12.2 mg/dL — ABNORMAL HIGH (ref 0.61–1.24)
Glucose, Bld: 87 mg/dL (ref 70–99)
HCT: 27 % — ABNORMAL LOW (ref 39.0–52.0)
Hemoglobin: 9.2 g/dL — ABNORMAL LOW (ref 13.0–17.0)
Potassium: 5.9 mmol/L — ABNORMAL HIGH (ref 3.5–5.1)
Sodium: 135 mmol/L (ref 135–145)
TCO2: 22 mmol/L (ref 22–32)

## 2020-08-01 LAB — BASIC METABOLIC PANEL
Anion gap: 21 — ABNORMAL HIGH (ref 5–15)
BUN: 69 mg/dL — ABNORMAL HIGH (ref 6–20)
CO2: 18 mmol/L — ABNORMAL LOW (ref 22–32)
Calcium: 9.8 mg/dL (ref 8.9–10.3)
Chloride: 95 mmol/L — ABNORMAL LOW (ref 98–111)
Creatinine, Ser: 12.15 mg/dL — ABNORMAL HIGH (ref 0.61–1.24)
GFR, Estimated: 5 mL/min — ABNORMAL LOW (ref >60)
Glucose, Bld: 82 mg/dL (ref 70–99)
Potassium: 6 mmol/L — ABNORMAL HIGH (ref 3.5–5.1)
Sodium: 134 mmol/L — ABNORMAL LOW (ref 135–145)

## 2020-08-01 LAB — TROPONIN I (HIGH SENSITIVITY)
Troponin I (High Sensitivity): 1049 ng/L (ref <18)
Troponin I (High Sensitivity): 1925 ng/L (ref <18)
Troponin I (High Sensitivity): 1964 ng/L (ref <18)

## 2020-08-01 LAB — I-STAT VENOUS BLOOD GAS, ED
Acid-base deficit: 2 mmol/L (ref 0.0–2.0)
Bicarbonate: 21.6 mmol/L (ref 20.0–28.0)
Calcium, Ion: 1.14 mmol/L — ABNORMAL LOW (ref 1.15–1.40)
HCT: 29 % — ABNORMAL LOW (ref 39.0–52.0)
Hemoglobin: 9.9 g/dL — ABNORMAL LOW (ref 13.0–17.0)
O2 Saturation: 75 %
Potassium: 5.8 mmol/L — ABNORMAL HIGH (ref 3.5–5.1)
Sodium: 136 mmol/L (ref 135–145)
TCO2: 23 mmol/L (ref 22–32)
pCO2, Ven: 33.6 mmHg — ABNORMAL LOW (ref 44.0–60.0)
pH, Ven: 7.416 (ref 7.250–7.430)
pO2, Ven: 39 mmHg (ref 32.0–45.0)

## 2020-08-01 LAB — CBC
HCT: 27.6 % — ABNORMAL LOW (ref 39.0–52.0)
Hemoglobin: 8.8 g/dL — ABNORMAL LOW (ref 13.0–17.0)
MCH: 36.5 pg — ABNORMAL HIGH (ref 26.0–34.0)
MCHC: 31.9 g/dL (ref 30.0–36.0)
MCV: 114.5 fL — ABNORMAL HIGH (ref 80.0–100.0)
Platelets: 324 10*3/uL (ref 150–400)
RBC: 2.41 MIL/uL — ABNORMAL LOW (ref 4.22–5.81)
RDW: 14.3 % (ref 11.5–15.5)
WBC: 20.6 10*3/uL — ABNORMAL HIGH (ref 4.0–10.5)
nRBC: 0 % (ref 0.0–0.2)

## 2020-08-01 LAB — RESP PANEL BY RT-PCR (FLU A&B, COVID) ARPGX2
Influenza A by PCR: NEGATIVE
Influenza B by PCR: NEGATIVE
SARS Coronavirus 2 by RT PCR: NEGATIVE

## 2020-08-01 LAB — CBG MONITORING, ED: Glucose-Capillary: 67 mg/dL — ABNORMAL LOW (ref 70–99)

## 2020-08-01 LAB — CK: Total CK: 145 U/L (ref 49–397)

## 2020-08-01 LAB — HIV ANTIBODY (ROUTINE TESTING W REFLEX): HIV Screen 4th Generation wRfx: NONREACTIVE

## 2020-08-01 LAB — IRON AND TIBC
Iron: 40 ug/dL — ABNORMAL LOW (ref 45–182)
Saturation Ratios: 27 % (ref 17.9–39.5)
TIBC: 147 ug/dL — ABNORMAL LOW (ref 250–450)
UIBC: 107 ug/dL

## 2020-08-01 LAB — PROTIME-INR
INR: 2.1 — ABNORMAL HIGH (ref 0.8–1.2)
Prothrombin Time: 22.8 seconds — ABNORMAL HIGH (ref 11.4–15.2)

## 2020-08-01 LAB — FERRITIN: Ferritin: 3132 ng/mL — ABNORMAL HIGH (ref 24–336)

## 2020-08-01 MED ORDER — POLYETHYLENE GLYCOL 3350 17 G PO PACK: 17.0000 g | PACK | Freq: Every day | ORAL | Status: DC | PRN

## 2020-08-01 MED ORDER — TACROLIMUS 0.5 MG PO CAPS
0.5000 mg | ORAL_CAPSULE | Freq: Every day | ORAL | Status: DC
  Administered 2020-08-02 – 2020-08-05 (×4): 0.5 mg via ORAL
  Filled 2020-08-01 (×9): qty 1

## 2020-08-01 MED ORDER — PREDNISONE 5 MG PO TABS
5.0000 mg | ORAL_TABLET | Freq: Every day | ORAL | Status: DC
  Administered 2020-08-03 – 2020-08-10 (×8): 5 mg via ORAL
  Filled 2020-08-01 (×9): qty 1

## 2020-08-01 MED ORDER — ACETAMINOPHEN 650 MG RE SUPP: 650.0000 mg | Freq: Four times a day (QID) | RECTAL | Status: DC | PRN

## 2020-08-01 MED ORDER — NITROGLYCERIN 2 % TD OINT
1.0000 [in_us] | TOPICAL_OINTMENT | Freq: Once | TRANSDERMAL | Status: AC
  Administered 2020-08-01: 1 [in_us] via TOPICAL
  Filled 2020-08-01: qty 1

## 2020-08-01 MED ORDER — SODIUM ZIRCONIUM CYCLOSILICATE 10 G PO PACK
10.0000 g | PACK | Freq: Once | ORAL | Status: AC
  Administered 2020-08-01: 10 g via ORAL
  Filled 2020-08-01: qty 1

## 2020-08-01 MED ORDER — SODIUM CHLORIDE 0.9 % IV SOLN
1.0000 g | Freq: Once | INTRAVENOUS | Status: AC
  Administered 2020-08-01: 1 g via INTRAVENOUS
  Filled 2020-08-01: qty 10

## 2020-08-01 MED ORDER — SODIUM CHLORIDE 0.9 % IV SOLN
500.0000 mg | Freq: Once | INTRAVENOUS | Status: DC
  Filled 2020-08-01: qty 500

## 2020-08-01 MED ORDER — INSULIN GLARGINE 100 UNIT/ML ~~LOC~~ SOLN: 10.0000 [IU] | Freq: Every day | SUBCUTANEOUS | Status: DC

## 2020-08-01 MED ORDER — IOHEXOL 350 MG/ML SOLN
100.0000 mL | Freq: Once | INTRAVENOUS | Status: AC | PRN
  Administered 2020-08-02: 100 mL via INTRAVENOUS

## 2020-08-01 MED ORDER — INSULIN ASPART 100 UNIT/ML ~~LOC~~ SOLN
0.0000 [IU] | Freq: Three times a day (TID) | SUBCUTANEOUS | Status: DC
  Administered 2020-08-10: 1 [IU] via SUBCUTANEOUS

## 2020-08-01 MED ORDER — HEPARIN SODIUM (PORCINE) 5000 UNIT/ML IJ SOLN: 5000.0000 [IU] | Freq: Three times a day (TID) | INTRAMUSCULAR | Status: DC

## 2020-08-01 MED ORDER — CHLORHEXIDINE GLUCONATE CLOTH 2 % EX PADS
6.0000 | MEDICATED_PAD | Freq: Every day | CUTANEOUS | Status: DC
  Administered 2020-08-03 – 2020-08-08 (×4): 6 via TOPICAL

## 2020-08-01 MED ORDER — ACETAMINOPHEN 325 MG PO TABS
650.0000 mg | ORAL_TABLET | Freq: Four times a day (QID) | ORAL | Status: DC | PRN
  Administered 2020-08-02 – 2020-08-06 (×6): 650 mg via ORAL
  Filled 2020-08-01 (×7): qty 2

## 2020-08-01 MED ORDER — LISINOPRIL 10 MG PO TABS: 10.0000 mg | ORAL_TABLET | Freq: Every day | ORAL | Status: DC

## 2020-08-01 MED ORDER — AMLODIPINE BESYLATE 10 MG PO TABS
10.0000 mg | ORAL_TABLET | Freq: Every day | ORAL | Status: DC
  Administered 2020-08-01 – 2020-08-10 (×9): 10 mg via ORAL
  Filled 2020-08-01 (×8): qty 1
  Filled 2020-08-01 (×2): qty 2

## 2020-08-01 MED ORDER — LABETALOL HCL 5 MG/ML IV SOLN
20.0000 mg | Freq: Once | INTRAVENOUS | Status: AC
  Administered 2020-08-01: 20 mg via INTRAVENOUS
  Filled 2020-08-01: qty 4

## 2020-08-01 NOTE — ED Notes (Signed)
Patient tolerating PO well, 1 cup OJ given, BGL 67.

## 2020-08-01 NOTE — ED Triage Notes (Signed)
Patient arrived via EMS; from home. C/O difficulty breathing, chest pain and unknown last dialysis session. S/p fall x 2 days; reported s/p left toe amputation a week ago.

## 2020-08-01 NOTE — Progress Notes (Signed)
Patient trialed off BIPAP. Patient tolerating 4L nasal cannula at this time.

## 2020-08-01 NOTE — Consult Note (Signed)
ESRD Consult Note [facility]  Requesting provider: Dorie Rank, MD  Outpatient dialysis unit: [place] Outpatient dialysis schedule: MWF  Assessment/Recommendations:   ESRD:  -outpatient orders: F200, 3.5HRS, 350/700. 2K/2.5Ca. Heparin 1k units bolus -HD today, resume MWF thereafter  Hyperkalemia: HD today with 2K bath.  Per review of his admission at [facility], seems to be happening pretty frequently.  May very well need Lokelma on off dialysis days.  Monitor for now  Volume/ hypertension/pulmonary edema: -edw 69kg, UF as tolerated. Resume home anti-htns  Anemia of Chronic Kidney Disease: Hemoglobin 9.9, iron panel ordered -aranesp 61mcg every Wed -venofer once every 2 weeks  Secondary Hyperparathyroidism/Hyperphosphatemia: resume home binders (on renvela), monitor phos and PTH  -hectorol 4.8mcg qtreatment  Vascular access: rue avf +b/t  # Additional recommendations: - Dose all meds for creatinine clearance < 10 ml/min  - Unless absolutely necessary, no MRIs with gadolinium.  - Implement save arm precautions.  Prefer needle sticks in the dorsum of the hands or wrists.  No blood pressure measurements in arm. - If blood transfusion is requested during hemodialysis sessions, please alert [place] prior to the session.  - If a hemodialysis catheter line culture is requested, please alert [place] as only hemodialysis nurses are able to collect those specimens.   Gean Quint, MD [facility]  History of Present Illness: James [name] is a/an 50 y.o. 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  He was recently admitted at [facility] and was discharged on January 6.  During this timeframe he had been receiving dialysis on schedule (Monday Wednesday Friday).  It is  unclear whether or not he went to dialysis on January 7. COVID negative here. Called his outpatient dialysis unit, last HD was indeed on Friday (patient cannot recall). His biggest concern is his high potassium. Makes very minimal amounts of urine (essentially anuric)   Medications:  Current Facility-Administered Medications  Medication Dose Route Frequency Provider Last Rate Last Admin  . Chlorhexidine Gluconate Cloth 2 % PADS 6 each  6 each Topical Q0600 Gean Quint, MD      . sodium zirconium cyclosilicate (LOKELMA) packet 10 g  10 g Oral Once Dorie Rank, MD       Current Outpatient Medications  Medication Sig Dispense Refill  . acetaminophen (TYLENOL) 500 MG tablet Take 500-1,000 mg by mouth every 6 (six) hours as needed (pain or headaches).     Marland Kitchen amLODipine (NORVASC) 10 MG tablet Take 1 tablet (10 mg total) by mouth daily. 30 tablet 0  . ASPIRIN LOW DOSE 81 MG EC tablet Take 81 mg by mouth daily.     Marland Kitchen buPROPion (WELLBUTRIN XL) 150 MG 24 hr tablet Take 150 mg by mouth daily.    . Cyanocobalamin (B-12) 500 MCG TABS Take 500 mcg by mouth daily.     . diphenhydrAMINE (BENADRYL) 25 mg capsule Take 25 mg by mouth every 6 (six) hours as needed for itching.     Marland Kitchen doxazosin (CARDURA) 2 MG tablet Take 2 mg by mouth at bedtime.    . ferric citrate (AURYXIA) 1 GM 210 MG(Fe) tablet Take 210-420 mg by mouth See admin instructions. Take 210-630 mg by mouth one to three times a day with meals and 210 mg with each snack    . ferrous sulfate  325 (65 FE) MG tablet Take 325 mg by mouth 2 (two) times daily with a meal.     . folic acid (FOLVITE) 1 MG tablet Take 1 mg by mouth daily.     Marland Kitchen gabapentin (NEURONTIN) 100 MG capsule Take 100 mg by mouth at bedtime.    . insulin aspart (NOVOLOG FLEXPEN) 100 UNIT/ML FlexPen Inject 10 Units into the skin 3 (three) times daily with meals. 15 mL 11  . Insulin Detemir (LEVEMIR FLEXTOUCH) 100 UNIT/ML Pen Inject 15 Units into the skin at bedtime. 15 mL 11  .  latanoprost (XALATAN) 0.005 % ophthalmic solution Place 1 drop into both eyes at bedtime.     Marland Kitchen lisinopril (ZESTRIL) 10 MG tablet Take 10 mg by mouth at bedtime.    Marland Kitchen loperamide (IMODIUM) 2 MG capsule Take 2 mg by mouth as needed for diarrhea or loose stools.     . Melatonin 5 MG CAPS Take 5 mg by mouth at bedtime.     . metoprolol tartrate (LOPRESSOR) 100 MG tablet Take 100 mg by mouth 2 (two) times daily.     . Multiple Vitamin (MULTIVITAMIN WITH MINERALS) TABS tablet Take 1 tablet by mouth daily.    Marland Kitchen omeprazole (PRILOSEC) 20 MG capsule Take 20 mg by mouth daily.     Marland Kitchen oxyCODONE-acetaminophen (PERCOCET) 5-325 MG tablet Take 1 tablet by mouth every 6 (six) hours as needed. 20 tablet 0  . predniSONE (DELTASONE) 5 MG tablet Take 5 mg by mouth daily with breakfast.     . tacrolimus (PROGRAF) 0.5 MG capsule Take 0.5 mg by mouth daily.     Marland Kitchen triamcinolone cream (KENALOG) 0.1 % Apply 1 application topically daily. (dermatitis) After bathing    . valGANciclovir (VALCYTE) 450 MG tablet Take 450 mg by mouth every Monday, Wednesday, and Friday with hemodialysis.   5  . warfarin (COUMADIN) 5 MG tablet Take 5-7.5 mg by mouth See admin instructions. Take 1.5 tablets (7.5 mg) by mouth in the evenings on Mondays, Wednesdays, & Fridays. Take 1 tablet (5 mg) by mouth in the evening on Sundays, Tuesdays, Thursdays, & Saturdays.    Marland Kitchen zolpidem (AMBIEN) 10 MG tablet Take 10 mg by mouth at bedtime as needed for sleep.        ALLERGIES Tramadol hcl, Nsaids, and Darvon [propoxyphene]  MEDICAL HISTORY Past Medical History:  Diagnosis Date  . Anemia   . Cancer [facility])    prostate cancer, s/p prostatectomy 04/24/19  . Complication of anesthesia    slow to wake up  . Diabetes mellitus without complication ([place])    type 2 - pt states no meds, diet controlled as of 04/22/20  . Dyspnea    occasional  . GERD (gastroesophageal reflux disease)   . History of blood transfusion   . Hypertension   . Influenza A   .  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 Pneumonia 04/21/2017   several times per pt on 04/22/20  . Renal disorder    dialysis m w f  . 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 He then got HD in [facility] until getting a renal transplant in 2009 at [place], [place].  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    . Smoker   . Smoker    tx wellbutriin  . Tachycardia   . Transplant recipient      SOCIAL HISTORY Social History   Socioeconomic History  . Marital status: Single    Spouse name: Not on file  . Number of children: Not on file  . Years of education: Not on file  . Highest education level: Not on file  Occupational History  . Not on file  Tobacco Use  . Smoking status: Current Every Day Smoker    Packs/day: 0.25    Years: 26.00    Pack years: 6.50    Types: Cigarettes  . Smokeless tobacco: Never Used  Vaping Use  . Vaping Use: Never used  Substance and Sexual Activity  . Alcohol use: No  . Drug use: No  . Sexual activity: Not on file  Other Topics Concern  . Not on file  Social History Narrative  . Not on file   Social Determinants of Health   Financial Resource Strain: Not on file  Food Insecurity: Not on file  Transportation Needs: Not on file  Physical Activity: Not on file  Stress: Not on file  Social Connections: Not on file  Intimate Partner Violence: Not on file     FAMILY HISTORY Family History  Adopted: Yes     Review of Systems: 12 systems were reviewed and negative except per HPI  Physical Exam: Vitals:   08/01/20 1059 08/01/20 1115  BP:  (!) 171/102  Pulse: (!) 112 (!) 114  Resp: (!) 28 (!) 27  Temp:    SpO2: 100% 100%   No intake/output data recorded. No intake  or output data in the 24 hours ending 08/01/20 1132 General: well-appearing, no acute distress, sitting up in bed HEENT: anicteric sclera, MMM CV: tachycardic, no murmurs, no edema Lungs: BIPAP in place, bilateral chest rise, normal wob Abd: soft, non-tender, non-distended Skin: no visible lesions or rashes Msk: left tma, right bka Psych: alert, engaged, appropriate mood and affect Neuro: normal speech, no gross focal deficits   Test Results Reviewed Lab Results  Component Value Date   NA 136 08/01/2020   NA 135 08/01/2020   K 5.8 (H) 08/01/2020   K 5.9 (H) 08/01/2020   CL 100 08/01/2020   CO2 25 11/04/2019   BUN 66 (H) 08/01/2020   CREATININE 12.20 (H) 08/01/2020   CALCIUM 8.7 (L) 11/04/2019   ALBUMIN 1.7 (L) 11/04/2019   PHOS 4.6 11/04/2019    I have reviewed relevant outside healthcare records

## 2020-08-01 NOTE — ED Notes (Signed)
Lab to add CK to latest blood sample sent at [place],

## 2020-08-01 NOTE — Progress Notes (Signed)
Per Dr. Candiss Norse move HD tx to tomorrow.

## 2020-08-01 NOTE — ED Provider Notes (Signed)
[facility] EMERGENCY DEPARTMENT Provider Note   CSN: [id number] Arrival date & time: 08/01/20  1002     History Chief Complaint  Patient presents with  . Respiratory Distress    James [name] is a 50 y.o. male.  HPI   Patient presents to the ED with complaints of shortness of breath.  Patient has a history of renal failure.  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  Patient was recently in the hospital at [facility] and was admitted on December 26.  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  Patient was receiving dialysis while he was in the hospital.  He was discharged on January 6.  Patient would have received dialysis on January 5.  Patient cannot tell me the last time he went to dialysis.  He can also not tell me when he started to feel short of breath.  Pain in his chest.  Cording to the EMS report he also fell 2 days ago.  Unknown whether the patient's been vaccinated for COVID.  Unknown if he has any recent fevers or chills.  Past Medical History:  Diagnosis Date  . Anemia   . Cancer [facility])    prostate cancer, s/p prostatectomy 04/24/19  . Complication of anesthesia    slow to wake up  . Diabetes mellitus without complication ([place])    type 2 - pt states no meds, diet controlled as of 04/22/20  . Dyspnea    occasional  . GERD (gastroesophageal reflux disease)   . History of blood transfusion   . Hypertension   . Influenza A   . 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 Pneumonia 04/21/2017   several times per pt on 04/22/20  . Renal disorder    dialysis m w f  . 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 He then got HD in [facility] until getting a renal transplant in 2009 at [place], [place].  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    . Smoker   . Smoker    tx wellbutriin  . Tachycardia   . Transplant recipient     Patient Active Problem List   Diagnosis Date Noted  . Spontaneous bacterial peritonitis ([place]) 10/28/2019  . 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 Hypotension 03/27/2019  . Anemia   . Renal transplant disorder   . Diarrhea 08/13/2015  . Tachycardia 04/09/2014  . ESRD on peritoneal dialysis ([place])   . Diabetes mellitus without complication ([place])   . Hypertension     Past Surgical History:  Procedure Laterality Date  . AV FISTULA PLACEMENT Right 11/02/2019   Procedure: ARTERIOVENOUS (AV) FISTULA CREATION RIGHT UPPER ARM;  Surgeon: Elam Dutch, MD;  Location: [facility] OR;  Service: Vascular;  Laterality: Right;  . AV FISTULA PLACEMENT Right 04/25/2020   Procedure: INSERTION OF ARTERIOVENOUS (AV) GORE-TEX GRAFT ARM;  Surgeon: Elam Dutch, MD;  Location: [facility];  Service: Vascular;  Laterality: Right;  . BASCILIC VEIN TRANSPOSITION Right 11/02/2019   Procedure: Right Cephalic Vein Transposition;  Surgeon:  Elam Dutch, MD;  Location: [facility] OR;  Service: Vascular;  Laterality: Right;  . BIOPSY  03/31/2019   Procedure: BIOPSY;  Surgeon: Clarene Essex, MD;  Location: [place];  Service: Endoscopy;;  . CAPD REMOVAL Left 11/04/2019   Procedure: CONTINUOUS AMBULATORY PERITONEAL DIALYSIS  (CAPD) CATHETER REMOVAL;  Surgeon: Ralene Ok, MD;  Location: [facility];  Service: General;  Laterality: Left;  . CHOLECYSTECTOMY    . COLONOSCOPY WITH PROPOFOL N/A 03/31/2019   Procedure: COLONOSCOPY WITH PROPOFOL;  Surgeon: Clarene Essex, MD;  Location: [facility];  Service: Endoscopy;  Laterality: N/A;  . HIP SURGERY    . INSERTION OF DIALYSIS CATHETER Left 04/24/2017   Procedure: EXCHANGE  OF DIALYSIS CATHETER;  Surgeon: Rosetta Posner, MD;  Location: [facility];   Service: Vascular;  Laterality: Left;  . INSERTION OF DIALYSIS CATHETER Left 11/02/2019   Procedure: INSERTION OF TUNNELED DIALYSIS CATHETER LEFT INTERNAL JUGULAR;  Surgeon: Elam Dutch, MD;  Location: [place];  Service: Vascular;  Laterality: Left;  . JOINT REPLACEMENT Right    hip  . KIDNEY TRANSPLANT Right   . PERITONEAL CATHETER INSERTION Left 08/2017  . POLYPECTOMY  03/31/2019   Procedure: POLYPECTOMY;  Surgeon: Clarene Essex, MD;  Location: [facility];  Service: Endoscopy;;  . VENOGRAM N/A 11/02/2019   Procedure: Elaina Hoops;  Surgeon: Elam Dutch, MD;  Location: [facility] OR;  Service: Vascular;  Laterality: N/A;       Family History  Adopted: Yes    Social History   Tobacco Use  . Smoking status: Current Every Day Smoker    Packs/day: 0.25    Years: 26.00    Pack years: 6.50    Types: Cigarettes  . Smokeless tobacco: Never Used  Vaping Use  . Vaping Use: Never used  Substance Use Topics  . Alcohol use: No  . Drug use: No    Home Medications Prior to Admission medications   Medication Sig Start Date End Date Taking? Authorizing Provider  acetaminophen (TYLENOL) 500 MG tablet Take 500-1,000 mg by mouth every 6 (six) hours as needed (pain or headaches).     [provider]  amLODipine (NORVASC) 10 MG tablet Take 1 tablet (10 mg total) by mouth daily. 11/04/19 04/14/21  Alma Friendly, MD  ASPIRIN LOW DOSE 81 MG EC tablet Take 81 mg by mouth daily.  09/18/19   [provider]  buPROPion (WELLBUTRIN XL) 150 MG 24 hr tablet Take 150 mg by mouth daily.    [provider]  Cyanocobalamin (B-12) 500 MCG TABS Take 500 mcg by mouth daily.  08/12/19   [provider]  diphenhydrAMINE (BENADRYL) 25 mg capsule Take 25 mg by mouth every 6 (six) hours as needed for itching.     [provider]  doxazosin (CARDURA) 2 MG tablet Take 2 mg by mouth at bedtime.    [provider]  ferric citrate (AURYXIA) 1 GM 210 MG(Fe)  tablet Take 210-420 mg by mouth See admin instructions. Take 210-630 mg by mouth one to three times a day with meals and 210 mg with each snack    [provider]  ferrous sulfate 325 (65 FE) MG tablet Take 325 mg by mouth 2 (two) times daily with a meal.     [provider]  folic acid (FOLVITE) 1 MG tablet Take 1 mg by mouth daily.  08/12/19   [provider]  gabapentin (NEURONTIN) 100 MG capsule Take 100 mg by mouth at bedtime. 03/11/20  [provider]  insulin aspart (NOVOLOG FLEXPEN) 100 UNIT/ML FlexPen Inject 10 Units into the skin 3 (three) times daily with meals. 04/25/17   Mariel Aloe, MD  Insulin Detemir (LEVEMIR FLEXTOUCH) 100 UNIT/ML Pen Inject 15 Units into the skin at bedtime. 09/23/17   Regalado, Belkys A, MD  latanoprost (XALATAN) 0.005 % ophthalmic solution Place 1 drop into both eyes at bedtime.     [provider]  lisinopril (ZESTRIL) 10 MG tablet Take 10 mg by mouth at bedtime. 03/11/20   [provider]  loperamide (IMODIUM) 2 MG capsule Take 2 mg by mouth as needed for diarrhea or loose stools.     [provider]  Melatonin 5 MG CAPS Take 5 mg by mouth at bedtime.     [provider]  metoprolol tartrate (LOPRESSOR) 100 MG tablet Take 100 mg by mouth 2 (two) times daily.  10/13/19   [provider]  Multiple Vitamin (MULTIVITAMIN WITH MINERALS) TABS tablet Take 1 tablet by mouth daily.    [provider]  omeprazole (PRILOSEC) 20 MG capsule Take 20 mg by mouth daily.     [provider]  oxyCODONE-acetaminophen (PERCOCET) 5-325 MG tablet Take 1 tablet by mouth every 6 (six) hours as needed. 04/25/20   Rhyne, Hulen Shouts, PA-C  predniSONE (DELTASONE) 5 MG tablet Take 5 mg by mouth daily with breakfast.     [provider]  tacrolimus (PROGRAF) 0.5 MG capsule Take 0.5 mg by mouth daily.     [provider]  triamcinolone cream (KENALOG) 0.1 % Apply 1 application  topically daily. (dermatitis) After bathing    [provider]  valGANciclovir (VALCYTE) 450 MG tablet Take 450 mg by mouth every Monday, Wednesday, and Friday with hemodialysis.  08/22/17   [provider]  warfarin (COUMADIN) 5 MG tablet Take 5-7.5 mg by mouth See admin instructions. Take 1.5 tablets (7.5 mg) by mouth in the evenings on Mondays, Wednesdays, & Fridays. Take 1 tablet (5 mg) by mouth in the evening on Sundays, Tuesdays, Thursdays, & Saturdays. 10/11/19   [provider]  zolpidem (AMBIEN) 10 MG tablet Take 10 mg by mouth at bedtime as needed for sleep.     [provider]    Allergies    Tramadol hcl, Nsaids, and Darvon [propoxyphene]  Review of Systems   Review of Systems  All other systems reviewed and are negative.   Physical Exam Updated Vital Signs BP (!) 154/137   Pulse (!) 118   Temp 98.2 F (36.8 C) (Oral)   Resp (!) 28   SpO2 100%   Physical Exam Vitals and nursing note reviewed.  Constitutional:      General: He is in acute distress.     Appearance: He is well-developed and well-nourished. He is ill-appearing and diaphoretic.  HENT:     Head: Normocephalic and atraumatic.     Right Ear: External ear normal.     Left Ear: External ear normal.  Eyes:     General: No scleral icterus.       Right eye: No discharge.        Left eye: No discharge.     Conjunctiva/sclera: Conjunctivae normal.  Neck:     Trachea: No tracheal deviation.  Cardiovascular:     Rate and Rhythm: Regular rhythm. Tachycardia present.     Pulses: Intact distal pulses.  Pulmonary:     Effort: Pulmonary effort is normal. No respiratory distress.     Breath  sounds: No stridor. Rales present. No wheezing.     Comments: Right greater than left Abdominal:     General: Bowel sounds are normal. There is no distension.     Palpations: Abdomen is soft.     Tenderness: There is no abdominal tenderness. There is no guarding or rebound.   Musculoskeletal:        General: No edema.     Cervical back: Neck supple.     Comments: S/p amputation digits left foot no erythema, no calf tenderness of swelling  Skin:    Findings: No erythema or rash.  Neurological:     Mental Status: He is alert.     Cranial Nerves: No cranial nerve deficit (no facial droop, extraocular movements intact, no slurred speech).     Sensory: No sensory deficit.     Motor: No abnormal muscle tone or seizure activity.     Coordination: Coordination normal.     Deep Tendon Reflexes: Strength normal.  Psychiatric:        Mood and Affect: Mood and affect normal.     Comments: anxious     ED Results / Procedures / Treatments   Labs (all labs ordered are listed, but only abnormal results are displayed) Labs Reviewed  BASIC METABOLIC PANEL - Abnormal; Notable for the following components:      Result Value   Sodium 134 (*)    Potassium 6.0 (*)    Chloride 95 (*)    CO2 18 (*)    BUN 69 (*)    Creatinine, Ser 12.15 (*)    GFR, Estimated 5 (*)    Anion gap 21 (*)    All other components within normal limits  CBC - Abnormal; Notable for the following components:   WBC 20.6 (*)    RBC 2.41 (*)    Hemoglobin 8.8 (*)    HCT 27.6 (*)    MCV 114.5 (*)    MCH 36.5 (*)    All other components within normal limits  I-STAT VENOUS BLOOD GAS, ED - Abnormal; Notable for the following components:   pCO2, Ven 33.6 (*)    Potassium 5.8 (*)    Calcium, Ion 1.14 (*)    HCT 29.0 (*)    Hemoglobin 9.9 (*)    All other components within normal limits  I-STAT CHEM 8, ED - Abnormal; Notable for the following components:   Potassium 5.9 (*)    BUN 66 (*)    Creatinine, Ser 12.20 (*)    Hemoglobin 9.2 (*)    HCT 27.0 (*)    All other components within normal limits  TROPONIN I (HIGH SENSITIVITY) - Abnormal; Notable for the following components:   Troponin I (High Sensitivity) 1,049 (*)    All other components within normal limits  RESP PANEL BY RT-PCR (FLU  A&B, COVID) ARPGX2  POC SARS CORONAVIRUS 2 AG -  ED  TROPONIN I (HIGH SENSITIVITY)    EKG EKG Interpretation  Date/Time:  Monday August 01 2020 10:05:29 EST Ventricular Rate:  161 PR Interval:    QRS Duration: 101 QT Interval:  334 QTC Calculation: 547 R Axis:   21 Text Interpretation: Sinus tachycardia Consider right atrial enlargement Probable left ventricular hypertrophy Abnrm T, consider ischemia, anterolateral lds Anterior ST elevation, probably due to LVH Prolonged QT interval Since last tracing rate faster st and t wave changes new since last tracing Confirmed by Dorie Rank [phone]) on 08/01/2020 10:19:08 AM   Radiology DG Chest Portable 1  View  Result Date: 08/01/2020 CLINICAL DATA:  Shortness of breath EXAM: PORTABLE CHEST 1 VIEW COMPARISON:  11/02/2019 FINDINGS: Cardiomegaly. Atherosclerotic calcification of the aortic knob. Pulmonary vascular congestion with diffuse bilateral interstitial opacities. More confluent airspace disease within the lung bases, right worse than left. Possible trace right pleural effusion. No pneumothorax. IMPRESSION: Cardiomegaly with bilateral interstitial opacities, likely pulmonary edema. A superimposed infectious process would be difficult to exclude, particularly at the right lung base. Electronically Signed   By: Davina Poke D.O.   On: 08/01/2020 10:50    Procedures .Critical Care Performed by: Dorie Rank, MD Authorized by: Dorie Rank, MD   Critical care provider statement:    Critical care time (minutes):  45   Critical care was time spent personally by me on the following activities:  Discussions with consultants, evaluation of patient's response to treatment, examination of patient, ordering and performing treatments and interventions, ordering and review of laboratory studies, ordering and review of radiographic studies, pulse oximetry, re-evaluation of patient's condition, obtaining history from patient or surrogate and review of old  charts   (including critical care time)  Medications Ordered in ED Medications  sodium zirconium cyclosilicate (LOKELMA) packet 10 g (has no administration in time range)  Chlorhexidine Gluconate Cloth 2 % PADS 6 each (has no administration in time range)  cefTRIAXone (ROCEPHIN) 1 g in sodium chloride 0.9 % 100 mL IVPB (has no administration in time range)  azithromycin (ZITHROMAX) 500 mg in sodium chloride 0.9 % 250 mL IVPB (has no administration in time range)  nitroGLYCERIN (NITROGLYN) 2 % ointment 1 inch (1 inch Topical Given 08/01/20 1031)  labetalol (NORMODYNE) injection 20 mg (20 mg Intravenous Given 08/01/20 1032)    ED Course  I have reviewed the triage vital signs and the nursing notes.  Pertinent labs & imaging results that were available during my care of the patient were reviewed by me and considered in my medical decision making (see chart for details).  Clinical Course as of 08/01/20 1317  Mon Aug 01, 2020  1109 Chest x-ray shows vascular congestion.  Electrolyte panel shows hyperkalemia and elevated BUN and creatinine [JK]  1109 Venous blood gas does not show any significant acidosis. [KK]  9381 Discussed case with nephrology Dr Candiss Norse.  Plan on dialysis. [WE]  9937 Blood pressure has improved most recent 154/137.  Patient remains comfortable on dialysis. [JK]    Clinical Course User Index [JK] Dorie Rank, MD   MDM Rules/Calculators/A&P                         Appears to be in distress.  Question hypertensive emergency, fluid overload from lack of dialysis, possible pneumonia, PE also concern with his recent surgery.  We will proceed with laboratory test x-rays.  Start nitroglycerin.  BiPAP ordered.  Patient's x-ray shows pulmonary edema.  This certainly would account for his shortness of breath and his oxygen requirement.  I doubt pulmonary embolism.  She has improved with nitroglycerin as well as BiPAP.  Patient does have leukocytosis.  Infection is a possibility.   Patient's white blood cell count was 6.8 on January 6 at [facility].  Patient given dose of antibiotics.  Nephrology consulted regarding his missed dialysis.  COVID test is currently pending.  Will consult with medical service for admission.  Final Clinical Impression(s) / ED Diagnoses Final diagnoses:  Acute pulmonary edema ([place])  Hypertensive urgency      Dorie Rank, MD 08/01/20  1317  

## 2020-08-01 NOTE — H&P (Cosign Needed Addendum)
Date: 08/01/2020               Patient Name:  James [name] MRN: [id number]  DOB: Dec 29, 1970 Age / Sex: 50 y.o., male   PCP: [name], MD         Medical Service: Internal Medicine Teaching Service         Attending Physician: Dr. Lucious Groves, DO    First Contact: Dr. Johnney Ou Pager: [phone]  Second Contact: Dr. Gilford Rile Pager: [phone]       After Hours (After 5p/  First Contact Pager: [phone]  weekends / holidays): Second Contact Pager: [phone]   Chief Complaint: Shortness of breath, Confusion  History of Present Illness:   Mr. 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 Per the patient, he was recently discharged from [facility] for ischemic necrosis of the left third, fourth toes s/p transmetatarsal amputation. Patient refused post-op SNF/Rehab facility at that time.   The patient is unsure as to what ensued prior to his admission, but states the last thing he remembers was his home nurse talking with him and telling him he was not making sense. He is unsure if he lost consciousness or what he was doing at the time. The patient is unable to recall the past few days prior to arrival. He notes his home nurse then called EMS. Per further chart review [facility], patient's nurse noted the patient not feeling well. She did an assessment which revealed heart rate of 147 and chest pain.  He endorsed persistent shortness of breath, currently relieved with BiPAP. He noted sharp non radiating left sided chest pain that has resolved. He is unsure of his last dialysis session. He is unsure if he has been sick recently. He endorses pain in his left foot from his recent transmetatarsal amputation. He denies fever, chills, cough, abdominal  pain.  The patient lives at home alone. He is unsure the last time he took his medications. Unsure of the last time he received dialysis. Per nephrology who called pt's dialysis center, patient last dialysis session was 07/29/20 (4 days prior to admission)  Denies history of stroke, MI, heart cath.  ED Course - Patient initial BP of 158/107. Chest x-ray positive for pulmonary edema. Noted improvement of shortness of breath with BiPAP and nitro paste. Significant lab values, potassium of 6.0. BUN of 69. Gap of 21. Troponin I of 1000. EKG sinus tachycardia. Admitted to IMTS as well as nephrology/cardiology consults.   Meds: No current facility-administered medications on file prior to encounter.   Current Outpatient Medications on File Prior to Encounter  Medication Sig Dispense Refill  . aspirin EC 81 MG tablet Take 81 mg by mouth daily. Swallow whole.    Marland Kitchen acetaminophen (TYLENOL) 500 MG tablet Take 500-1,000 mg by mouth every 6 (six) hours as needed (pain or headaches).     Marland Kitchen amLODipine (NORVASC) 10 MG tablet Take 1 tablet (10 mg total) by mouth daily. 30 tablet 0  . buPROPion (WELLBUTRIN XL) 150 MG 24 hr tablet Take 150 mg by mouth daily.    . Cyanocobalamin (B-12) 500 MCG TABS Take 500 mcg by mouth daily.     . diphenhydrAMINE (BENADRYL) 25 mg capsule Take 25 mg  by mouth every 6 (six) hours as needed for itching.     Marland Kitchen doxazosin (CARDURA) 2 MG tablet Take 2 mg by mouth at bedtime.    . ferric citrate (AURYXIA) 1 GM 210 MG(Fe) tablet Take 210-420 mg by mouth See admin instructions. Take 2 tablets (420 mg) by mouth up to three times daily with meals and 1 tablet (210 mg) with snacks    . gabapentin (NEURONTIN) 100 MG capsule Take 100 mg by mouth at bedtime.    . insulin aspart (NOVOLOG FLEXPEN) 100 UNIT/ML FlexPen Inject 10 Units into the skin 3 (three) times daily with meals. (Patient taking differently: Inject 2 Units into the skin 3 (three) times daily as needed for high blood sugar (CBG  >180).) 15 mL 11  . Insulin Detemir (LEVEMIR FLEXTOUCH) 100 UNIT/ML Pen Inject 15 Units into the skin at bedtime. (Patient not taking: Reported on 08/01/2020) 15 mL 11  . latanoprost (XALATAN) 0.005 % ophthalmic solution Place 1 drop into both eyes at bedtime.     Marland Kitchen lisinopril (ZESTRIL) 10 MG tablet Take 10 mg by mouth at bedtime.    Marland Kitchen loperamide (IMODIUM) 2 MG capsule Take 2 mg by mouth as needed for diarrhea or loose stools.     . Melatonin 5 MG CAPS Take 5 mg by mouth at bedtime as needed (sleep).    . metoprolol tartrate (LOPRESSOR) 100 MG tablet Take 100 mg by mouth 2 (two) times daily.     Marland Kitchen omeprazole (PRILOSEC) 20 MG capsule Take 20 mg by mouth daily.     Marland Kitchen oxyCODONE (OXY IR/ROXICODONE) 5 MG immediate release tablet Take 5 mg by mouth every 6 (six) hours as needed (pain).    Marland Kitchen oxyCODONE-acetaminophen (PERCOCET) 5-325 MG tablet Take 1 tablet by mouth every 6 (six) hours as needed. 20 tablet 0  . predniSONE (DELTASONE) 5 MG tablet Take 5 mg by mouth daily with breakfast.     . tacrolimus (PROGRAF) 0.5 MG capsule Take 0.5 mg by mouth daily.     Marland Kitchen triamcinolone cream (KENALOG) 0.1 % Apply 1 application topically daily. (dermatitis) After bathing    . valGANciclovir (VALCYTE) 450 MG tablet Take 450 mg by mouth every Monday, Wednesday, and Friday with hemodialysis.   5  . warfarin (COUMADIN) 5 MG tablet Take 5-7.5 mg by mouth See admin instructions. Take 1.5 tablets (7.5 mg) by mouth in the evenings on Mondays, Wednesdays, & Fridays. Take 1 tablet (5 mg) by mouth in the evening on Sundays, Tuesdays, Thursdays, & Saturdays.    Marland Kitchen zolpidem (AMBIEN) 10 MG tablet Take 10 mg by mouth at bedtime as needed for sleep.      Allergies: Allergies as of 08/01/2020 - Review Complete 08/01/2020  Allergen Reaction Noted  . Tramadol hcl Shortness Of Breath and Other (See Comments) 08/05/2009  . Nsaids Other (See Comments) 03/27/2019  . Darvon [propoxyphene] Rash and Other (See Comments) 04/09/2014   Past  Medical History:  Diagnosis Date  . Anemia   . Avascular necrosis ([place])   . BPH (benign prostatic hyperplasia)   . Complication of anesthesia    slow to wake up  . ESRD (end stage renal disease) ([place])    failed kidney transplant  . GERD (gastroesophageal reflux disease)   . Glaucoma   . History of blood transfusion   . Hypertension   . Hypertriglyceridemia   . Influenza A   . 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  Peripheral vascular disease ([place])     s/p prior R BKA 07/2019 and recent L TMA 06/2020  . Pneumonia 04/21/2017   several times per pt on 04/22/20  . Prolonged QT interval   . Prostate cancer ([place])    prostate cancer, s/p prostatectomy 04/24/19  . Renal disorder    dialysis m w f  . 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 He then got HD in [facility] until getting a renal transplant in 2009 at [place], [place].  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    . Smoker    tx wellbutriin  . 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 Tachycardia   . Thrombocytopenia ([place])   . Transplant recipient   . Type 2 diabetes mellitus ([place])    type 2 - pt states no meds, diet controlled as of 04/22/20   Family History: Patient adopted, does not know FHx   Social History:  Lives at home, worked as a Hotel manager.  Previous tobacco user. Quit one month ago, started at 51 y/o. Pack every 2-3 days.  No ETOH No Drugs  Review of Systems: A complete ROS was negative except as per HPI.  Physical Exam: Blood pressure (!) 170/93, pulse (!) 136, temperature 98.2 F (36.8 C), temperature source Oral, resp. rate 15, SpO2 100 %. Physical Exam Vitals and nursing note reviewed.   Constitutional:      General: He is not in acute distress.    Appearance: He is not ill-appearing.  HENT:     Head: Normocephalic and atraumatic.  Eyes:     Pupils: Pupils are equal, round, and reactive to light.  Cardiovascular:     Rate and Rhythm: Regular rhythm. Tachycardia present.     Pulses: Normal pulses.     Heart sounds: No murmur heard. No friction rub. No gallop.   Pulmonary:     Effort: Respiratory distress present.     Breath sounds: No stridor. No wheezing, rhonchi or rales.     Comments: BiPAP in place Abdominal:     General: Abdomen is flat.     Palpations: Abdomen is soft.     Tenderness: There is no abdominal tenderness. There is no guarding or rebound.  Musculoskeletal:     Comments: RUE dialysis graft in place. Palpable thrill present.  Asterixis negative  Right BKA. Left transmetatarsal transection, sutures in place. Area is not warm. No erythema appreciated. No active drainage or signs of infection  Neurological:     General: No focal deficit present.     Mental Status: He is alert. Mental status is at baseline.     Sensory: No sensory deficit.     Comments: Oriented to person and place only    Labs: CBC    Component Value Date/Time   WBC 20.6 (H) 08/01/2020 1020   RBC 2.41 (L) 08/01/2020 1020   HGB 9.9 (L) 08/01/2020 1026   HGB 9.2 (L) 08/01/2020 1026   HCT 29.0 (L) 08/01/2020 1026   HCT 27.0 (L) 08/01/2020 1026   HCT 20.9 (L) 10/30/2019 0536   PLT 324 08/01/2020 1020   MCV 114.5 (H) 08/01/2020 1020   MCH 36.5 (H) 08/01/2020  1020   MCHC 31.9 08/01/2020 1020   RDW 14.3 08/01/2020 1020   LYMPHSABS 0.7 11/04/2019 0436   MONOABS 0.2 11/04/2019 0436   EOSABS 0.0 11/04/2019 0436   BASOSABS 0.0 11/04/2019 0436     CMP     Component Value Date/Time   NA 136 08/01/2020 1026   NA 135 08/01/2020 1026   K 5.8 (H) 08/01/2020 1026   K 5.9 (H) 08/01/2020 1026   CL 100 08/01/2020 1026   CO2 18 (L) 08/01/2020 1020   GLUCOSE 87 08/01/2020 1026    BUN 66 (H) 08/01/2020 1026   CREATININE 12.20 (H) 08/01/2020 1026   CALCIUM 9.8 08/01/2020 1020   PROT 7.1 10/28/2019 1024   ALBUMIN 1.7 (L) 11/04/2019 0436   AST 24 10/28/2019 1024   ALT 20 10/28/2019 1024   ALKPHOS 89 10/28/2019 1024   BILITOT 0.5 10/28/2019 1024   GFRNONAA 5 (L) 08/01/2020 1020   GFRAA 7 (L) 11/04/2019 0436    Imaging: DG Chest Portable 1 View  Result Date: 08/01/2020 CLINICAL DATA:  Shortness of breath EXAM: PORTABLE CHEST 1 VIEW COMPARISON:  11/02/2019 FINDINGS: Cardiomegaly. Atherosclerotic calcification of the aortic knob. Pulmonary vascular congestion with diffuse bilateral interstitial opacities. More confluent airspace disease within the lung bases, right worse than left. Possible trace right pleural effusion. No pneumothorax. IMPRESSION: Cardiomegaly with bilateral interstitial opacities, likely pulmonary edema. A superimposed infectious process would be difficult to exclude, particularly at the right lung base. Electronically Signed   By: Davina Poke D.O.   On: 08/01/2020 10:50   EKG: personally reviewed my interpretation is rate of 161 sinus tachycardia. Non specific ST wave abnormalities. Compared to prior sinus tachycardia 120's with non specific st wave changes  CXR: personally reviewed my interpretation is Cardiomegaly with bilateral pleural effusions, worse on the right.   Assessment & Plan by Problem: Active Problems:   Respiratory failure ([place])  James [name] is a 50 y.o. 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 Shortness of  breath improved and chest pain resolved with BiPAP and nitro paste.Stable oxygen saturations on this. X-ray revealed pulmonary edema. Unclear etiology at this time; hypertension vs acute myocardial event vs volume overloaded in setting of ESRD.Trending troponin 1000 >1900. Cardiology/nephrology consulted for further evaluation. If no improvement and continued leukocytosis, will consider infectious process. -continue BiPAP - O2 goal of 90-94% -wean supplemental oxygen as tolerable -further cardiac workup- pending CTA Chest.  -dialysis tomorrow per Dr. Candiss Norse   #Troponemia Troponins of 1000>1900.  EKG without ischemic changes when compared to prior. Unclear etiology at this time, possible demand ischemia in setting of pulmonary edema and hypertension vs acute myocardiac infarction. While I would expect some elevation of troponin I in context of ESRD and hypertension, the significant elevation and upward trend requires further cardiac workup. Cardiology following for further evaluation and workup. Recommended CTA Chest to assess for aortic dissection. If negative, recommend starting heparin when INR less than 2 and holding coumadin. -CT Angio Chest/Abd pending -start heparin and DC coumadin if CTA negative and once INR below 2. Repeat INR in the am.  -  echocardiogram ordered -trend troponins  #Sinus Tachycardia #QT Prolongation QTC of 547 on initial EKG. Per chart review, appears to have history of QT prolongation. Per chart review, patient with history of sinus tachycardia and prolonged QT interval, zio patch was placed 11/03/202 at [facility], unable to view results. During recent hospitalization for transmetatarsal amputation 06/2020-07/2020), patient was found to be in sinus tachycardia, his metoprolol was restarted at this time. Patient has had therapeutic INR on coumadin, lowers my suspicion for acute PE at this time.  -restart metoprolol when able -continue to monitor on tele -avoid qtc prolonging  medications  #Acute Encephalopathy #Anion Gap Metabolic Acidosis Patient alert to only person and place, not time. He was unable to answer questions that pertained to days preceding his admission. Able to recall long term memory however. Hx of encephalopathy suspected to be secondary to uremia per chart review. On admission BUN of 69. Suspect encephalopathy and anion gap metabolic acidosis secondary to uremia ince admission. Recent addition of pain medications for his amputation may also be contributing to enecephalopthy -continue to monitor mental status -hold centrally acting medications -patient without neurological deficits -dialysis tomorrow per nephro  #ESRD on Dialysis MWF #Hx of Failed Kidney Transplant 2018 #Hyperkalemia Patient with extensive history of CKD followed by [facility] Neph[facility]. Currently receiving dialysis MWF, last session 07/29/2020. Hyperkalemic with K of 6.0. Lokelma given in ED. Nephro following.  -dialysis tomorrow -continue prednisone -continue tacrolimus  #Leukocytosis WBC of 20 upon admission. Will continue to monitor, suspect secondary to prednisone use. Will monitor for infection in context of recent surgical procedure. Area not warm, not concerned for infection at this time.  -monitor for infection  #Left Transmetatarsal Transection 06/2020 #Right BKA 07/2019 #PVD Patient with history of PVD leading to right BKA. Admitted at [facility] 07/17/2020-07/28/2020 for left transmetatarsal transection. At discharge patient advised to attend SNF facility however he requested to be discharged home. Does not appear infected. Patient states he has been on home pain medciations -wound care consult in place  #Splenic Infarct Currently on warfarin. INR of 2.1, theraputic -holding coumadin at this time, will start on heparin if CTA chest/abdomen negative.   #Hx of HTN Home medications of amlodipine, lisinopril. Patient unsure of last time he took his medications. Suspect  hypertension in setting of medication non-adherence. Given labetalol in the ED. -continue home amlodipine -hold lisinopril in setting of hyperkalemia  #Hx of Type 2 DM A1c of 5.1 at [facility] 06/2020 -SSI initiated  Macrocytic Anemia Hgb of 8.8, MCV of 114.5. Iron panel, ferritin pending. Folate/B12 pending. Suspect component of anemia of CKD as well.   Diet: NPO VTE: SCDs IVF: None,None Code: Full  Prior to Admission Living Arrangement: Home, living alone Anticipated Discharge Location: Home Barriers to Discharge: Further evaluation of shortness of breath  Dispo: Admit patient to Inpatient with expected length of stay greater than 2 midnights.  Signed: Riesa Pope, MD Internal Medicine Resident PGY-1 Pager: [phone]  08/01/2020, 9:21 PM

## 2020-08-01 NOTE — Consult Note (Addendum)
Cardiology Consultation:   Patient ID: [name] MRN: [id number]; DOB: August 25, 1970  Admit date: 08/01/2020 Date of Consult: 08/01/2020  Primary Care Provider: [name], MD [facility] Cardiologist: James Moores, MD  [place] Electrophysiologist:  None   Patient Profile:   James [name] is a 50 y.o. 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 Tomi [name].  History of Present Illness:   [name] has no known formal history of CAD or CHF that I can find. I reviewed extensive notes from outside facility which is where this information is gleaned. Notes indicate a history of baseline tachycardia and prolonged QT interval in the past. He has had a complex PMH to include ESRD due to uncontrolled hypertension, status post renal transplant in September 2008. 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 More recent years have been complicated by PVD requiring amputations as above and prior lower extremity stenting. In 08/2019 he sustained a splenic infarct, question related to cancer or other etiology with echo showing EF 50-55%, normal LA size, no cardiac source of embolus identified. He was in the hospital in December with ischemic toes and underwent L TMA. PVD was felt to be contributing but vascular surgery did not feel revascularization would be tolerated. He  was discharged 1/6 and INR therapeutic at that time.  He lives alone and this morning his aide came in to check on him and found him confused and not making sense. He'd had a fall per report. EMS transported him to the hospital and he was placed on BiPAP. In retrospect the patient wonders if perhaps he had accidentally taken too much narcotic pain medicine.  All he recalls around this time is having very intense left sided chest pressure without radiation for an undetermined period of time. He remembers feeling SOB. Last dialysis was confirmed as usual on Friday per nephrology's call to dialysis center (MWF schedule). Upon arrival to [facility] he was still somewhat confused and required BiPAP. He remains tachycardic and markedly hypertensive up to 209/118. EKG initially concerning for SVT/flutter but closer look on telemetry looks more consistent with sinus tachycardia. Labs show hyperkalemia of 6.0, Cr 12, BUN 69, leukocytosis of 20k (previously leukopenia in 10/2019, normal on 07/29/19), Hgb 8.8 macrocytic, hsTroponin 1049 with follow-up pending, Covid/flu negative. CXR showed cardiomegaly with bilateral interstitial opacities likely pulmonary edema, cannot exclude superimposed infection. He denies any fevers or chills. He was ordered for 20mg  of labetalol, NTG paste, azithromycin and ceftriaxone. Given QT prolongation I did speak with IM regarding concern for azithromycin and they will review. His chest pain has since eased off since starting BiPAP.   Past Medical History:  Diagnosis Date  . Anemia   . Avascular necrosis ([place])   . BPH (benign prostatic hyperplasia)   . Complication of anesthesia    slow to wake up  . ESRD (end stage renal disease) ([place])    failed kidney transplant  . GERD (gastroesophageal reflux disease)   .  Glaucoma   . History of blood transfusion   . Hypertension   . Hypertriglyceridemia   . Influenza A   . Lymphoproliferative disorder ([place])    post transplant EBV  associated B-cell lymphoproliferative disorder  . Peripheral vascular disease ([place])     s/p prior R BKA 07/2019 and recent L TMA 06/2020  . Pneumonia 04/21/2017   several times per pt on 04/22/20  . Prolonged QT interval   . Prostate cancer ([place])    prostate cancer, s/p prostatectomy 04/24/19  . Renal disorder    dialysis m w f  . 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 He then got HD in [facility] until getting a renal transplant in 2009 at [place], [place].  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    . Smoker    tx wellbutriin  . 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 Tachycardia   . Thrombocytopenia ([place])   . Transplant recipient   . Type 2 diabetes mellitus ([place])    type 2 - pt states no meds, diet controlled as of 04/22/20    Past Surgical History:  Procedure Laterality Date  . AV FISTULA PLACEMENT Right 11/02/2019   Procedure: ARTERIOVENOUS (AV) FISTULA CREATION RIGHT UPPER ARM;  Surgeon: Elam Dutch, MD;  Location[facility];  Service: Vascular;  Laterality: Right;  . AV FISTULA PLACEMENT Right 04/25/2020   Procedure: INSERTION OF ARTERIOVENOUS (AV) GORE-TEX GRAFT ARM;  Surgeon: Elam Dutch, MD;  Location: [place];  Service: Vascular;  Laterality: Right;  . BASCILIC VEIN TRANSPOSITION Right 11/02/2019   Procedure: Right Cephalic Vein Transposition;  Surgeon: Elam Dutch, MD;  Location: [place];  Service: Vascular;  Laterality: Right;  . BIOPSY  03/31/2019   Procedure: BIOPSY;  Surgeon: Clarene Essex, MD;  Location: [place];  Service: Endoscopy;;  . CAPD REMOVAL Left 11/04/2019   Procedure: CONTINUOUS AMBULATORY PERITONEAL DIALYSIS  (CAPD) CATHETER REMOVAL;  Surgeon: Ralene Ok, MD;  Location: [place];  Service: General;   Laterality: Left;  . CHOLECYSTECTOMY    . COLONOSCOPY WITH PROPOFOL N/A 03/31/2019   Procedure: COLONOSCOPY WITH PROPOFOL;  Surgeon: Clarene Essex, MD;  Location: [place];  Service: Endoscopy;  Laterality: N/A;  . HIP SURGERY    . INSERTION OF DIALYSIS CATHETER Left 04/24/2017   Procedure: EXCHANGE  OF DIALYSIS CATHETER;  Surgeon: Rosetta Posner, MD;  Location: [place];  Service: Vascular;  Laterality: Left;  . INSERTION OF DIALYSIS CATHETER Left 11/02/2019   Procedure: INSERTION OF TUNNELED DIALYSIS CATHETER LEFT INTERNAL JUGULAR;  Surgeon: Elam Dutch, MD;  Location: [place];  Service: Vascular;  Laterality: Left;  . JOINT REPLACEMENT Right    hip  . KIDNEY TRANSPLANT Right   . PERITONEAL CATHETER INSERTION Left 08/2017  . POLYPECTOMY  03/31/2019   Procedure: POLYPECTOMY;  Surgeon: Clarene Essex, MD;  Location: [place];  Service: Endoscopy;;  . VENOGRAM N/A 11/02/2019   Procedure: Elaina Hoops;  Surgeon: Elam Dutch, MD;  Location[facility] OR;  Service: Vascular;  Laterality: N/A;     Home Medications:  Prior to Admission medications   Medication Sig Start Date End Date Taking? Authorizing Provider  aspirin  EC 81 MG tablet Take 81 mg by mouth daily. Swallow whole.   Yes [provider]  acetaminophen (TYLENOL) 500 MG tablet Take 500-1,000 mg by mouth every 6 (six) hours as needed (pain or headaches).     [provider]  amLODipine (NORVASC) 10 MG tablet Take 1 tablet (10 mg total) by mouth daily. 11/04/19 04/14/21  Alma Friendly, MD  buPROPion (WELLBUTRIN XL) 150 MG 24 hr tablet Take 150 mg by mouth daily.    [provider]  Cyanocobalamin (B-12) 500 MCG TABS Take 500 mcg by mouth daily.  08/12/19   [provider]  diphenhydrAMINE (BENADRYL) 25 mg capsule Take 25 mg by mouth every 6 (six) hours as needed for itching.     [provider]  doxazosin (CARDURA) 2 MG tablet Take 2 mg by mouth at bedtime.    [provider]   ferric citrate (AURYXIA) 1 GM 210 MG(Fe) tablet Take 210-420 mg by mouth See admin instructions. Take 2 tablets (420 mg) by mouth up to three times daily with meals and 1 tablet (210 mg) with snacks    [provider]  gabapentin (NEURONTIN) 100 MG capsule Take 100 mg by mouth at bedtime. 03/11/20   [provider]  insulin aspart (NOVOLOG FLEXPEN) 100 UNIT/ML FlexPen Inject 10 Units into the skin 3 (three) times daily with meals. Patient taking differently: Inject 2 Units into the skin 3 (three) times daily as needed for high blood sugar (CBG >180). 04/25/17   Mariel Aloe, MD  Insulin Detemir (LEVEMIR FLEXTOUCH) 100 UNIT/ML Pen Inject 15 Units into the skin at bedtime. Patient not taking: Reported on 08/01/2020 09/23/17   Regalado, Jerald Kief A, MD  latanoprost (XALATAN) 0.005 % ophthalmic solution Place 1 drop into both eyes at bedtime.     [provider]  lisinopril (ZESTRIL) 10 MG tablet Take 10 mg by mouth at bedtime. 03/11/20   [provider]  loperamide (IMODIUM) 2 MG capsule Take 2 mg by mouth as needed for diarrhea or loose stools.     [provider]  Melatonin 5 MG CAPS Take 5 mg by mouth at bedtime as needed (sleep).    [provider]  metoprolol tartrate (LOPRESSOR) 100 MG tablet Take 100 mg by mouth 2 (two) times daily.  10/13/19   [provider]  omeprazole (PRILOSEC) 20 MG capsule Take 20 mg by mouth daily.     [provider]  oxyCODONE (OXY IR/ROXICODONE) 5 MG immediate release tablet Take 5 mg by mouth every 6 (six) hours as needed (pain). 07/28/20   [provider]  oxyCODONE-acetaminophen (PERCOCET) 5-325 MG tablet Take 1 tablet by mouth every 6 (six) hours as needed. 04/25/20   Rhyne, Hulen Shouts, PA-C  predniSONE (DELTASONE) 5 MG tablet Take 5 mg by mouth daily with breakfast.     [provider]  tacrolimus (PROGRAF) 0.5 MG capsule Take 0.5 mg by mouth daily.     [provider]   triamcinolone cream (KENALOG) 0.1 % Apply 1 application topically daily. (dermatitis) After bathing    [provider]  valGANciclovir (VALCYTE) 450 MG tablet Take 450 mg by mouth every Monday, Wednesday, and Friday with hemodialysis.  08/22/17   [provider]  warfarin (COUMADIN) 5 MG tablet Take 5-7.5 mg by mouth See admin instructions. Take 1.5 tablets (7.5 mg) by mouth in the evenings on Mondays, Wednesdays, & Fridays. Take 1 tablet (5 mg) by mouth in the evening on Sundays,  Tuesdays, Thursdays, & Saturdays. 10/11/19   [provider]  zolpidem (AMBIEN) 10 MG tablet Take 10 mg by mouth at bedtime as needed for sleep.     [provider]    Inpatient Medications: Scheduled Meds: . Chlorhexidine Gluconate Cloth  6 each Topical Q0600  . heparin  5,000 Units Subcutaneous Q8H   Continuous Infusions: . cefTRIAXone (ROCEPHIN)  IV 1 g (08/01/20 1600)   PRN Meds: acetaminophen **OR** acetaminophen, polyethylene glycol  Allergies:    Allergies  Allergen Reactions  . Tramadol Hcl Shortness Of Breath and Other (See Comments)    Tramadol HCl = Shortness of breath and Tramadol = Rash  . Nsaids Other (See Comments)    Was told by MD to not take these  . Darvon [Propoxyphene] Rash and Other (See Comments)    Broke out on eyelid     Social History:   Social History   Socioeconomic History  . Marital status: Single    Spouse name: Not on file  . Number of children: Not on file  . Years of education: Not on file  . Highest education level: Not on file  Occupational History  . Not on file  Tobacco Use  . Smoking status: Current Every Day Smoker    Packs/day: 0.25    Years: 26.00    Pack years: 6.50    Types: Cigarettes  . Smokeless tobacco: Never Used  Vaping Use  . Vaping Use: Never used  Substance and Sexual Activity  . Alcohol use: No  . Drug use: No  . Sexual activity: Not on file  Other Topics Concern  . Not on file  Social History  Narrative  . Not on file   Social Determinants of Health   Financial Resource Strain: Not on file  Food Insecurity: Not on file  Transportation Needs: Not on file  Physical Activity: Not on file  Stress: Not on file  Social Connections: Not on file  Intimate Partner Violence: Not on file    Family History:    Family History  Adopted: Yes     ROS:  Please see the history of present illness.  All other ROS reviewed and negative.     Physical Exam/Data:   Vitals:   08/01/20 1415 08/01/20 1445 08/01/20 1500 08/01/20 1525  BP: (!) 175/109 (!) 159/139 (!) 162/93   Pulse: (!) 131 (!) 133 (!) 134 (!) 130  Resp: (!) 22 20 16  (!) 26  Temp:      TempSrc:      SpO2: 98% 100% 100% 100%   No intake or output data in the 24 hours ending 08/01/20 1605 Last 3 Weights 04/25/2020 04/22/2020 03/10/2020  Weight (lbs) 147 lb 14.9 oz 148 lb 150 lb  Weight (kg) 67.1 kg 67.132 kg 68.04 kg     There is no height or weight on file to calculate BMI.  General: Well developed AAM in no acute distress. Head: Normocephalic, atraumatic, sclera non-icteric, no xanthomas, nares are without discharge. Neck: Negative for carotid bruits. JVP not elevated. Lungs: Bipap in place with coarse BS bilaterally to auscultation without wheezes, rales, or rhonchi. Breathing is unlabored. Heart: RRR S1 S2 without murmurs, rubs, or gallops.  Abdomen: Soft, non-tender, non-distended with normoactive bowel sounds. No rebound/guarding. Extremities: No clubbing or cyanosis. S/p R BKA and recent L TMA with sutures still in place Neuro: Alert and oriented X 3. Moves all extremities spontaneously. Psych:  Responds to questions appropriately with a normal affect.  EKG:  The EKG was personally reviewed and demonstrates: narrow complex tachycardia 161bpm, possible RAE, diffuse nonspecific STTW changes, QTC 580ms  Telemetry:  Telemetry was personally reviewed and demonstrates:  Sinus tach, rare PAC, QTc now closer to  470s  Relevant CV Studies: 2D echo 08/27/19 Name: HAN, VEJAR Date: 08/27/2019  MRN: [id number]          Patient Location: [facility]  DOB: [birth year]         Gender: Male  Age: 33 yrs           Ethnicity: B       BP: 160/84  mmHg  Reason For Study: Splenic infarction           HR: 91  History: Cardiac thrombus  Ordering Physician: Margie Billet Performed By: Berniece Andreas   -  -  PROCEDURE  A two-dimensional transthoracic echocardiogram with color flow and  Doppler was performed. Image Quality: Technically adequate. Injection  of agitated saline contrast performed to evaluate for possible shunt.  -  SUMMARY  Injection of agitated saline contrast performed to evaluate for  possible shunt  There is mild concentric left ventricular hypertrophy.  Left ventricular systolic function is low normal.  LV ejection fraction = 50-55%.  The left atrial size is normal.  There is no significant change in comparison with the last study  Report done elsewhere in 2019  No intracardiac source of embolus identified   -  FINDINGS:  LEFT VENTRICLE  The left ventricular size is normal. There is mild concentric left  ventricular hypertrophy. Left ventricular systolic function is low  normal. LV ejection fraction = 50-55%.   -  RIGHT VENTRICLE  The right ventricle is normal in size and function.   LEFT ATRIUM  The left atrial size is normal.   RIGHT ATRIUM  Right atrial size is normal. Injection of agitated saline showed no  right-to-left shunt.  -  AORTIC VALVE  Aortic valve calcification. The aortic valve is trileaflet. There is  no aortic stenosis. There is no aortic regurgitation.  -  MITRAL VALVE  There is mild mitral annular calcification. The mitral valve leaflets  appear thickened, but open well. No Mitral stenosis. There is trace  mitral regurgitation.  -  TRICUSPID VALVE  The tricuspid valve is normal in structure and  function. No tricuspid  regurgitation. RVSP not able to be calculated.  -  PULMONIC VALVE  The pulmonic valve is not well visualized. There is no pulmonic  valvular regurgitation.  -  ARTERIES  The ascending aorta is normal size. The aortic root is normal.  -  VENOUS  IVC size was normal.  -  EFFUSION  There is no pericardial effusion.  -  -   MMode/2D Measurements & Calculations  IVSd: 1.5 cm    LA dim: 3.7 cm   ESV(MOD-sp4):   asc Aorta  LVIDd: 3.9 cm   RVDd: 2.5 cm    47.0 ml      Diam: 3.1 cm  LVPWd: 1.4 cm   EDV(MOD-sp4):   EDV(MOD-sp2):  LVIDs: 3.0 cm   97.3 ml      103.0 ml                    ESV(MOD-sp2):                    41.9 ml       _______________________________________________________________________  LVOT diam: 2.1 cm SV(MOD-sp4):    EF A4C: 51.6 %  LA ESV (BP):           50.3 ml               55.8 ml       _______________________________________________________________________  LA ESV Index    LA ESV Index    LA ESV Index (BP):SV A4C:  (A2C): 40.0 ml/m2 (A4C): 24.8 ml/m2 31.7 ml/m2    50.2 ml   Doppler Measurements & Calculations  MV E max vel:   MV V2 max:      MV dec time: SV(LVOT):  67.4 cm/sec    133.6 cm/sec     0.16 sec   57.9 ml  MV A max vel:   MV max PG: 7.1 mmHg        Ao V2 max:  115.9 cm/sec   MV V2 mean:            150.7 cm/sec  MV E/A: 0.58   88.0 cm/sec            Ao max PG:  Lat Peak E' Vel: MV mean PG: 3.4 mmHg        9.1 mmHg  6.2 cm/sec    MV V2 VTI: 24.8 cm         Ao V2 mean:  E/Lat E`: 10.9  MVA(VTI): 2.3 cm2         103.3 cm/sec                            Ao mean PG:                            5.1 mmHg                            Ao V2 VTI:                             25.1 cm                            AVA (VTI):                            2.3 cm2       _______________________________________________________________________  LV V1 VTI: 17.1 cmAS Dimensionless          Index (VTI): 0.68   _______________________________________________________________________  Reading Physician:          MD Clarene Critchley, [phone] 08/27/2019 04:39 PM     Laboratory Data:  High Sensitivity Troponin:   Recent Labs  Lab 08/01/20 1020  TROPONINIHS 1,049*     Chemistry Recent Labs  Lab 08/01/20 1020 08/01/20 1026  NA 134* 136  135  K 6.0* 5.8*  5.9*  CL 95* 100  CO2 18*  --   GLUCOSE 82 87  BUN 69* 66*  CREATININE 12.15* 12.20*  CALCIUM 9.8  --   GFRNONAA 5*  --   ANIONGAP 21*  --     No results for input(s): PROT, ALBUMIN, AST, ALT, ALKPHOS, BILITOT in the last 168 hours. Hematology Recent Labs  Lab 08/01/20 1020 08/01/20 1026  WBC 20.6*  --   RBC 2.41*  --   HGB 8.8* 9.9*  9.2*  HCT 27.6* 29.0*  27.0*  MCV 114.5*  --   MCH 36.5*  --   MCHC 31.9  --   RDW 14.3  --   PLT 324  --    BNPNo results for input(s): BNP, PROBNP in the last 168 hours.  DDimer No results for input(s): DDIMER in the last 168 hours.   Radiology/Studies:  DG Chest Portable 1 View  Result Date: 08/01/2020 CLINICAL DATA:  Shortness of breath EXAM: PORTABLE CHEST 1 VIEW COMPARISON:  11/02/2019 FINDINGS: Cardiomegaly. Atherosclerotic calcification of the aortic knob. Pulmonary vascular congestion with diffuse bilateral interstitial opacities. More confluent airspace disease within the lung bases, right worse than left. Possible trace right pleural effusion. No pneumothorax. IMPRESSION: Cardiomegaly with bilateral interstitial opacities, likely pulmonary edema. A superimposed infectious process would be difficult to exclude, particularly at  the right lung base. Electronically Signed   By: Davina Poke D.O.   On: 08/01/2020 10:50     Assessment and Plan:   1. Acute encephalopathy with acute respiratory distress and pulmonary edema requiring bipap 2. Chest pain with suspected NSTEMI (does not account for entire presentation) 3. ESRD on hemodialysis - dialysis confirmed on Friday as usual, also with hyperkalemia 4. PVD s/p R BKA 07/2019, L TMA 06/2020 5. Splenic infarct, on Coumadin prior to admission - INR pending 6. Prolonged QT interval on EKG, appears slightly improved on telemetry now 7. Leukocytosis of unclear etiology  Complex patient presenting with confusion, AMS, fall with acute encephalopathy and SOB requiring BiPAP. He also remembers having severe chest pain (but difficult to recall further details) and presents with significant troponin elevation. This seems higher than expected for typical demand ischemia so cannot exclude concomitant ACS. Symptoms have improved with BiPAP and NTG ointment but he remains tachycardic. Given severity of presentation and hypertension, we would recommend to obtain a stat CT to exclude dissection. If this is negative we would suggest starting heparin per pharmacy when INR is less than 2 and holding Coumadin. Dr. Acie Fredrickson is here this evening and will look out for result to give this order. Will also obtain echocardiogram. F/u troponin level - does not appear AM lab went through. Check TSH. Follow O2 trends while weaning BiPAP. Would try to resume home meds so he does not get too far off schedule (except ACEi since hyperkalemic) Please see additional recs below. Also notified IM of recommendation.      TIMI Risk Score for Unstable Angina or Non-ST Elevation MI:   The patient's TIMI risk score is 3, which indicates a 13% risk of all cause mortality, new or recurrent myocardial infarction or need for urgent revascularization in the next 14 days.  For questions or updates, please contact [place] Please consult [email or web address] for contact info under    Signed, Charlie Pitter, PA-C  08/01/2020 4:05 PM   Attending Note:   The patient was seen and examined.  Agree with assessment and plan as noted above.  Changes made to the above note as needed.  Patient seen and independently examined with  Melina Copa, PA .   We discussed all aspects of the encounter. I agree with the assessment and plan as stated above.  1.   Chest pain :    Has ESRD,  Troponin is 1049 but this is in the setting of ESRD and marked HTN.  He is gradually feeling better . I do think he needs a CT angio to rule out asortic dissection  He has been on coumadin,  INR is theraputic.  I doubt this is a pulmonary embolus .   We can consider looking for a PE if the other work up is unremarkable.   2. HTN:  He does not remember the last time he took his BP meds.   3.      I have spent a total of 40 minutes with patient reviewing hospital  notes , telemetry, EKGs, labs and examining patient as well as establishing an assessment and plan that was discussed with the patient. > 50% of time was spent in direct patient care.    Thayer Headings, Brooke Bonito., MD, [facility] 08/01/2020, 5:28 PM [address],  [place] Pager [phone]

## 2020-08-02 ENCOUNTER — Inpatient Hospital Stay (HOSPITAL_COMMUNITY): Payer: Medicare Other

## 2020-08-02 ENCOUNTER — Other Ambulatory Visit (HOSPITAL_COMMUNITY): Payer: Medicare Other

## 2020-08-02 DIAGNOSIS — R000 Tachycardia, unspecified: Secondary | ICD-10-CM

## 2020-08-02 DIAGNOSIS — J9621 Acute and chronic respiratory failure with hypoxia: Secondary | ICD-10-CM | POA: Diagnosis not present

## 2020-08-02 DIAGNOSIS — D509 Iron deficiency anemia, unspecified: Secondary | ICD-10-CM

## 2020-08-02 DIAGNOSIS — G9340 Encephalopathy, unspecified: Secondary | ICD-10-CM

## 2020-08-02 DIAGNOSIS — I214 Non-ST elevation (NSTEMI) myocardial infarction: Secondary | ICD-10-CM | POA: Diagnosis not present

## 2020-08-02 DIAGNOSIS — J810 Acute pulmonary edema: Secondary | ICD-10-CM

## 2020-08-02 LAB — RENAL FUNCTION PANEL
Albumin: 2.4 g/dL — ABNORMAL LOW (ref 3.5–5.0)
Anion gap: 20 — ABNORMAL HIGH (ref 5–15)
BUN: 82 mg/dL — ABNORMAL HIGH (ref 6–20)
CO2: 20 mmol/L — ABNORMAL LOW (ref 22–32)
Calcium: 9.4 mg/dL (ref 8.9–10.3)
Chloride: 95 mmol/L — ABNORMAL LOW (ref 98–111)
Creatinine, Ser: 14.21 mg/dL — ABNORMAL HIGH (ref 0.61–1.24)
GFR, Estimated: 4 mL/min — ABNORMAL LOW (ref >60)
Glucose, Bld: 81 mg/dL (ref 70–99)
Phosphorus: 5.7 mg/dL — ABNORMAL HIGH (ref 2.5–4.6)
Potassium: 5.3 mmol/L — ABNORMAL HIGH (ref 3.5–5.1)
Sodium: 135 mmol/L (ref 135–145)

## 2020-08-02 LAB — CBC
HCT: 20.4 % — ABNORMAL LOW (ref 39.0–52.0)
HCT: 26.6 % — ABNORMAL LOW (ref 39.0–52.0)
Hemoglobin: 6.7 g/dL — CL (ref 13.0–17.0)
Hemoglobin: 8.5 g/dL — ABNORMAL LOW (ref 13.0–17.0)
MCH: 35.8 pg — ABNORMAL HIGH (ref 26.0–34.0)
MCH: 34.3 pg — ABNORMAL HIGH (ref 26.0–34.0)
MCHC: 32.8 g/dL (ref 30.0–36.0)
MCHC: 32 g/dL (ref 30.0–36.0)
MCV: 109.1 fL — ABNORMAL HIGH (ref 80.0–100.0)
MCV: 107.3 fL — ABNORMAL HIGH (ref 80.0–100.0)
Platelets: 163 10*3/uL (ref 150–400)
Platelets: 214 10*3/uL (ref 150–400)
RBC: 1.87 MIL/uL — ABNORMAL LOW (ref 4.22–5.81)
RBC: 2.48 MIL/uL — ABNORMAL LOW (ref 4.22–5.81)
RDW: 14.3 % (ref 11.5–15.5)
RDW: 16.1 % — ABNORMAL HIGH (ref 11.5–15.5)
WBC: 9.5 10*3/uL (ref 4.0–10.5)
WBC: 12.7 10*3/uL — ABNORMAL HIGH (ref 4.0–10.5)
nRBC: 0 % (ref 0.0–0.2)
nRBC: 0 % (ref 0.0–0.2)

## 2020-08-02 LAB — HEPATIC FUNCTION PANEL
ALT: 35 U/L (ref 0–44)
AST: 38 U/L (ref 15–41)
Albumin: 2.4 g/dL — ABNORMAL LOW (ref 3.5–5.0)
Alkaline Phosphatase: 209 U/L — ABNORMAL HIGH (ref 38–126)
Bilirubin, Direct: 0.3 mg/dL — ABNORMAL HIGH (ref 0.0–0.2)
Indirect Bilirubin: 0.4 mg/dL (ref 0.3–0.9)
Total Bilirubin: 0.7 mg/dL (ref 0.3–1.2)
Total Protein: 6.6 g/dL (ref 6.5–8.1)

## 2020-08-02 LAB — PROTIME-INR
INR: 2 — ABNORMAL HIGH (ref 0.8–1.2)
Prothrombin Time: 22.2 seconds — ABNORMAL HIGH (ref 11.4–15.2)

## 2020-08-02 LAB — POC OCCULT BLOOD, ED: Fecal Occult Bld: POSITIVE — AB

## 2020-08-02 LAB — CBG MONITORING, ED
Glucose-Capillary: 65 mg/dL — ABNORMAL LOW (ref 70–99)
Glucose-Capillary: 75 mg/dL (ref 70–99)

## 2020-08-02 LAB — GLUCOSE, CAPILLARY
Glucose-Capillary: 64 mg/dL — ABNORMAL LOW (ref 70–99)
Glucose-Capillary: 112 mg/dL — ABNORMAL HIGH (ref 70–99)

## 2020-08-02 LAB — HEPATITIS B SURFACE ANTIGEN: Hepatitis B Surface Ag: NONREACTIVE

## 2020-08-02 LAB — TSH: TSH: 1.344 u[IU]/mL (ref 0.350–4.500)

## 2020-08-02 LAB — PREPARE RBC (CROSSMATCH)

## 2020-08-02 MED ORDER — LIDOCAINE 5 % EX PTCH
1.0000 | MEDICATED_PATCH | CUTANEOUS | Status: DC
  Administered 2020-08-02 – 2020-08-09 (×8): 1 via TRANSDERMAL
  Filled 2020-08-02 (×8): qty 1

## 2020-08-02 MED ORDER — OXYCODONE HCL 5 MG PO TABS
5.0000 mg | ORAL_TABLET | Freq: Two times a day (BID) | ORAL | Status: DC | PRN
  Administered 2020-08-02 – 2020-08-05 (×6): 5 mg via ORAL
  Filled 2020-08-02 (×6): qty 1

## 2020-08-02 MED ORDER — DEXTROSE 50 % IV SOLN
INTRAVENOUS | Status: AC
  Administered 2020-08-02: 12.5 g via INTRAVENOUS
  Filled 2020-08-02: qty 50

## 2020-08-02 MED ORDER — LIDOCAINE HCL (PF) 1 % IJ SOLN: 5.0000 mL | INTRAMUSCULAR | Status: DC | PRN

## 2020-08-02 MED ORDER — LIDOCAINE-PRILOCAINE 2.5-2.5 % EX CREA
1.0000 "application " | TOPICAL_CREAM | CUTANEOUS | Status: DC | PRN
  Filled 2020-08-02: qty 5

## 2020-08-02 MED ORDER — METOPROLOL TARTRATE 5 MG/5ML IV SOLN
INTRAVENOUS | Status: AC
  Filled 2020-08-02: qty 5

## 2020-08-02 MED ORDER — METOPROLOL TARTRATE 25 MG PO TABS: 100.0000 mg | ORAL_TABLET | Freq: Two times a day (BID) | ORAL | Status: DC

## 2020-08-02 MED ORDER — METOPROLOL TARTRATE 50 MG PO TABS
50.0000 mg | ORAL_TABLET | Freq: Two times a day (BID) | ORAL | Status: DC
  Administered 2020-08-02 – 2020-08-03 (×3): 50 mg via ORAL
  Filled 2020-08-02: qty 1
  Filled 2020-08-02: qty 2
  Filled 2020-08-02: qty 1

## 2020-08-02 MED ORDER — ALTEPLASE 2 MG IJ SOLR: 2.0000 mg | Freq: Once | INTRAMUSCULAR | Status: DC | PRN

## 2020-08-02 MED ORDER — PENTAFLUOROPROP-TETRAFLUOROETH EX AERO
1.0000 "application " | INHALATION_SPRAY | CUTANEOUS | Status: DC | PRN
  Filled 2020-08-02: qty 116

## 2020-08-02 MED ORDER — SODIUM CHLORIDE 0.9% IV SOLUTION: Freq: Once | INTRAVENOUS | Status: DC

## 2020-08-02 MED ORDER — SODIUM CHLORIDE 0.9 % IV SOLN: 100.0000 mL | INTRAVENOUS | Status: DC | PRN

## 2020-08-02 MED ORDER — HEPARIN SODIUM (PORCINE) 1000 UNIT/ML DIALYSIS
1000.0000 [IU] | INTRAMUSCULAR | Status: DC | PRN
  Filled 2020-08-02 (×2): qty 1

## 2020-08-02 MED ORDER — METOPROLOL TARTRATE 5 MG/5ML IV SOLN
5.0000 mg | INTRAVENOUS | Status: DC
  Administered 2020-08-02: 5 mg via INTRAVENOUS
  Filled 2020-08-02: qty 5

## 2020-08-02 MED ORDER — METOPROLOL TARTRATE 5 MG/5ML IV SOLN
5.0000 mg | Freq: Once | INTRAVENOUS | Status: AC
  Administered 2020-08-02: 5 mg via INTRAVENOUS

## 2020-08-02 MED ORDER — METOPROLOL TARTRATE 25 MG PO TABS: 50.0000 mg | ORAL_TABLET | Freq: Two times a day (BID) | ORAL | Status: DC

## 2020-08-02 MED ORDER — DEXTROSE 50 % IV SOLN: 12.5000 g | INTRAVENOUS | Status: AC

## 2020-08-02 NOTE — ED Notes (Signed)
Pt given a cup of orange juice for CBG of 65

## 2020-08-02 NOTE — ED Notes (Signed)
Cardiology at bedside.

## 2020-08-02 NOTE — Progress Notes (Signed)
   08/02/20 1458  Assess: MEWS Score  Temp 98.5 F (36.9 C)  BP (!) 155/93  Pulse Rate (!) 120  ECG Heart Rate (!) 119  Resp 20  SpO2 96 %  O2 Device Nasal Cannula  O2 Flow Rate (L/min) 4 L/min  Assess: MEWS Score  MEWS Temp 0  MEWS Systolic 0  MEWS Pulse 2  MEWS RR 0  MEWS LOC 0  MEWS Score 2  MEWS Score Color Yellow  Assess: if the MEWS score is Yellow or Red  Were vital signs taken at a resting state? Yes  Focused Assessment No change from prior assessment  Early Detection of Sepsis Score *See Row Information* Low  MEWS guidelines implemented *See Row Information* Yes  Treat  MEWS Interventions Administered scheduled meds/treatments;Administered prn meds/treatments  Pain Scale 0-10  Pain Score 10  Pain Type Acute pain  Pain Location Leg  Pain Orientation Mid;Upper  Take Vital Signs  Increase Vital Sign Frequency  Yellow: Q 2hr X 2 then Q 4hr X 2, if remains yellow, continue Q 4hrs  Escalate  MEWS: Escalate Yellow: discuss with charge nurse/RN and consider discussing with provider and RRT  Notify: Charge Nurse/RN  Name of Charge Nurse/RN Notified Christy, RN  Date Charge Nurse/RN Notified 08/02/20  Time Charge Nurse/RN Notified 107  Notify: Provider  Provider Name/Title Masoudi, MD  Date Provider Notified 08/02/20  Time Provider Notified 1530  Notification Type  (Secure chat)  Notification Reason Other (Comment) (MEWS yellow)  Response See new orders  Date of Provider Response 08/02/20  Time of Provider Response 1532  Document  Patient Outcome Other (Comment) (Evaluating)

## 2020-08-02 NOTE — ED Notes (Signed)
[place] [phone]-per Davene Costain, RN paged by Levada Dy

## 2020-08-02 NOTE — Progress Notes (Signed)
ANTICOAGULATION CONSULT NOTE - Initial Consult  Pharmacy Consult for heparin Indication: DVT  Allergies  Allergen Reactions  . Tramadol Hcl Shortness Of Breath and Other (See Comments)    Tramadol HCl = Shortness of breath and Tramadol = Rash  . Nsaids Other (See Comments)    Was told by MD to not take these  . Darvon [Propoxyphene] Rash and Other (See Comments)    Broke out on eyelid     Patient Measurements:   Heparin Dosing Weight: 66.8 kg  Vital Signs: BP: 155/97 (01/10 2315) Pulse Rate: 142 (01/10 2315)  Labs: Recent Labs    08/01/20 1020 08/01/20 1026 08/01/20 1555 08/01/20 1828 08/01/20 1839  HGB 8.8* 9.9*  9.2*  --   --   --   HCT 27.6* 29.0*  27.0*  --   --   --   PLT 324  --   --   --   --   LABPROT  --   --  22.8*  --   --   INR  --   --  2.1*  --   --   CREATININE 12.15* 12.20*  --   --   --   CKTOTAL  --   --   --   --  145  TROPONINIHS 1,049*  --  1,925* 1,964*  --     CrCl cannot be calculated (Unknown ideal weight.).   Medical History: Past Medical History:  Diagnosis Date  . Anemia   . Avascular necrosis ([place])   . BPH (benign prostatic hyperplasia)   . Complication of anesthesia    slow to wake up  . ESRD (end stage renal disease) ([place])    failed kidney transplant  . GERD (gastroesophageal reflux disease)   . Glaucoma   . History of blood transfusion   . Hypertension   . Hypertriglyceridemia   . Influenza A   . 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 Pneumonia 04/21/2017   several times per pt on 04/22/20  . Prolonged QT interval   . Prostate cancer ([place])    prostate cancer, s/p prostatectomy 04/24/19  . Renal disorder    dialysis m w f  . Renal transplant recipient / ESRD    ESRD due to HTN > started HD in 2003, got HD in [place] until about 2006-07 when he was  discharged from [facility]. He then got HD in [facility] until getting a renal transplant in 2009 at [place] [place].  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    . Smoker    tx wellbutriin  . 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 Tachycardia   . Thrombocytopenia ([place])   . Transplant recipient   . Type 2 diabetes mellitus ([place])    type 2 - pt states no meds, diet controlled as of 04/22/20    Medications:  (Not in a hospital admission)   Assessment: 50 yo man on coumadin for h/o DVT to start heparin when INR <2.0 INR 2.1, Hg 9.9, PTLC 324  Goal of Therapy:  Heparin level 0.3-0.7 units/ml Monitor platelets by anticoagulation protocol: Yes   Plan:  Check INR with am  labs. Start heparin if <2.0  James [name] 08/02/2020,1:19 AM

## 2020-08-02 NOTE — Progress Notes (Signed)
HD#1 Subjective:  Overnight Events: CTA negative for dissection.    Patient evaluated while receiving dialysis. Patient continues to endorse left foot pain from transmetatarsal amputation. Denies chest pain, shortness of breath. Patient denies other complaints or concerns at the time of my examination.   Objective:  Vital signs in last 24 hours: Vitals:   08/01/20 1930 08/01/20 1953 08/01/20 2315 08/02/20 0333  BP: (!) 170/93 (!) 170/93 (!) 155/97 137/75  Pulse: (!) 133 (!) 136 (!) 142 (!) 133  Resp: 10 15 17 18   Temp:      TempSrc:      SpO2: 100% 100% 98% 98%   Supplemental O2: Nasal Cannula SpO2: 98 % O2 Flow Rate (L/min): 4 L/min FiO2 (%): 50 %   Physical Exam:  Physical Exam Vitals and nursing note reviewed.  Constitutional:      Appearance: He is ill-appearing and toxic-appearing.  HENT:     Head: Normocephalic and atraumatic.  Cardiovascular:     Rate and Rhythm: Regular rhythm. Tachycardia present.     Pulses: Normal pulses.     Heart sounds: Normal heart sounds. No murmur heard. No friction rub. No gallop.   Pulmonary:     Effort: Pulmonary effort is normal. No respiratory distress.     Breath sounds: Normal breath sounds.  Abdominal:     Tenderness: There is no abdominal tenderness.  Skin:    General: Skin is warm and dry.  Neurological:     Comments: Alert and oriented to self only, lethargic    There were no vitals filed for this visit.  No intake or output data in the 24 hours ending 08/02/20 0700 Net IO Since Admission: No IO data has been entered for this period [08/02/20 0700]  Pertinent Labs: CBC Latest Ref Rng & Units 08/01/2020 08/01/2020 08/01/2020  WBC 4.0 - 10.5 K/uL - - 20.6(H)  Hemoglobin 13.0 - 17.0 g/dL 9.9(L) 9.2(L) 8.8(L)  Hematocrit 39.0 - 52.0 % 29.0(L) 27.0(L) 27.6(L)  Platelets 150 - 400 K/uL - - 324    CMP Latest Ref Rng & Units 08/01/2020 08/01/2020 08/01/2020  Glucose 70 - 99 mg/dL - 87 82  BUN 6 - 20 mg/dL - 66(H) 69(H)   Creatinine 0.61 - 1.24 mg/dL - 12.20(H) 12.15(H)  Sodium 135 - 145 mmol/L 136 135 134(L)  Potassium 3.5 - 5.1 mmol/L 5.8(H) 5.9(H) 6.0(H)  Chloride 98 - 111 mmol/L - 100 95(L)  CO2 22 - 32 mmol/L - - 18(L)  Calcium 8.9 - 10.3 mg/dL - - 9.8  Total Protein 6.5 - 8.1 g/dL - - -  Total Bilirubin 0.3 - 1.2 mg/dL - - -  Alkaline Phos 38 - 126 U/L - - -  AST 15 - 41 U/L - - -  ALT 0 - 44 U/L - - -    Imaging: DG Chest Portable 1 View  Result Date: 08/01/2020 CLINICAL DATA:  Shortness of breath EXAM: PORTABLE CHEST 1 VIEW COMPARISON:  11/02/2019 FINDINGS: Cardiomegaly. Atherosclerotic calcification of the aortic knob. Pulmonary vascular congestion with diffuse bilateral interstitial opacities. More confluent airspace disease within the lung bases, right worse than left. Possible trace right pleural effusion. No pneumothorax. IMPRESSION: Cardiomegaly with bilateral interstitial opacities, likely pulmonary edema. A superimposed infectious process would be difficult to exclude, particularly at the right lung base. Electronically Signed   By: Davina Poke D.O.   On: 08/01/2020 10:50   CT Angio Chest/Abd/Pel for Dissection W and/or W/WO  Result Date: 08/02/2020 CLINICAL DATA:  Chest pain and back pain. Concern for aortic dissection. Pain status post fall 2 days ago with left toe amputation 1 week ago. EXAM: CT ANGIOGRAPHY CHEST, ABDOMEN AND PELVIS TECHNIQUE: Non-contrast CT of the chest was initially obtained. Multidetector CT imaging through the chest, abdomen and pelvis was performed using the standard protocol during bolus administration of intravenous contrast. Multiplanar reconstructed images and MIPs were obtained and reviewed to evaluate the vascular anatomy. CONTRAST:  121mL OMNIPAQUE IOHEXOL 350 MG/ML SOLN COMPARISON:  CT of the pelvis dated 10/28/2019. FINDINGS: CTA CHEST FINDINGS Cardiovascular: There is no evidence for a thoracic aortic dissection or aneurysm. There are atherosclerotic  changes of the thoracic aorta. There is no evidence for an acute pulmonary embolism. The heart size is moderately enlarged. There is a trace pericardial effusion. The arch vessels are patent where visualized. Mediastinum/Nodes: --there is mild mediastinal adenopathy, likely reactive. -- No hilar lymphadenopathy. -- No axillary lymphadenopathy. -- No supraclavicular lymphadenopathy. -- Normal thyroid gland where visualized. -  Unremarkable esophagus. Lungs/Pleura: There are small bilateral pleural effusions with adjacent atelectasis. There is diffuse bilateral interlobular septal thickening with hazy bilateral ground-glass airspace opacification. There is no definite pneumothorax. Musculoskeletal: No chest wall abnormality. No bony spinal canal stenosis. Review of the MIP images confirms the above findings. CTA ABDOMEN AND PELVIS FINDINGS VASCULAR Aorta: Normal caliber aorta without aneurysm, dissection, vasculitis or significant stenosis. Atherosclerotic changes are noted. Celiac: Patent without evidence of aneurysm, dissection, vasculitis or significant stenosis. SMA: Patent without evidence of aneurysm, dissection, vasculitis or significant stenosis. Renals: Both renal arteries are highly stenotic. IMA: Patent without evidence of aneurysm, dissection, vasculitis or significant stenosis. Inflow: Patent without evidence of aneurysm, dissection, vasculitis or significant stenosis. Veins: No obvious venous abnormality within the limitations of this arterial phase study. Review of the MIP images confirms the above findings. NON-VASCULAR Hepatobiliary: Small hepatic cysts are again noted. Status post cholecystectomy.There is no biliary ductal dilation. Pancreas: Normal contours without ductal dilatation. No peripancreatic fluid collection. Spleen: The spleen is borderline enlarged. Adrenals/Urinary Tract: --Adrenal glands: Unremarkable. --Right kidney/ureter: The right kidney is atrophic. --Left kidney/ureter: The left  kidney is atrophic. There is a growing exophytic nodule rising from the lower pole measuring approximately 25 Hounsfield units and 2.1 cm. The left kidney is atrophic. There is a right pelvic transplant kidney in place with evidence for cortical thinning. There is no hydronephrosis. --Urinary bladder: Unremarkable. Stomach/Bowel: --Stomach/Duodenum: No hiatal hernia or other gastric abnormality. Normal duodenal course and caliber. --Small bowel: Unremarkable. --Colon: Unremarkable. --Appendix: Normal. Lymphatic: --No retroperitoneal lymphadenopathy. --No mesenteric lymphadenopathy. --there are few mildly enlarged pelvic lymph nodes which are presumably reactive in etiology. Reproductive: Not well evaluated secondary to streak artifact. Other: No ascites or free air. The abdominal wall is normal. Musculoskeletal. The patient is status post total hip arthroplasty on the right. Review of the MIP images confirms the above findings. IMPRESSION: 1. No acute vascular abnormality. 2. Cardiomegaly with small bilateral pleural effusions and findings consistent with developing pulmonary edema. An underlying atypical infectious process is not entirely excluded. 3. Indeterminate growing exophytic nodule rising from the lower pole the left kidney. This is favored to represent a proteinaceous or hemorrhagic cyst, however follow-up with an outpatient renal ultrasound is recommended. 4. Atrophic native kidneys. The right pelvic transplant kidney demonstrates cortical thinning without hydronephrosis. Findings could indicate transplant failure. 5. Additional chronic findings as detailed above. Aortic Atherosclerosis (ICD10-I70.0). Electronically Signed   By: Constance Holster M.D.   On: 08/02/2020 01:01  Assessment/Plan:   Active Problems:   Respiratory failure [facility])   Patient Summary:  James [name] is a 50 y.o. with pertinent PMH of  s/p failed transplant on ESRD HD MWF followed by [facility], lymphoma  2013, HTN, BPH, prostate cancer, T2DM, PVD s/p right BKA and left TMA  who presented with shortness of breath and altered mental status and admitted for acute hypoxic respiratory failure, troponemia, and electrolyte abnormalities.  #Acute Hypoxic Respiratory Failure  #Chest Pain Transitioned to 4L [place], within O2 sat goal. Denies new episodes of chest pain. Will assess patient after dialysis to see if shortness of breath improved. Suspect secondary to pulmonary edema in setting of hypervolemia and hypertension.  -continue BiPAP - O2 goal of 90-94% -wean supplemental oxygen as tolerable -CTA chest negative for aortic dissection -dialysis today per nephro   #Anemia Morning Hgb of 6.7 from 9.9. Uncertain etiology as to where blood loss is coming from. Ferritin of 3,132, iron of 40, TIBC of 147. Consistent with anemia of chronic disease. Folate/B12 pending -1 unit of PRBC. Repeat afternoon CBC.  -FOBT in setting of patient being poor historian 2/2 encephalopathy  #Troponemia Troponins of 1000>1900>1980. Patient continues to be without EKG changes. Suspect secondary to stress reaction, tachycardia, and renal failure. Per chart review, patient on metoprolol 100 mg BID. Has not had since admission and uncertain last time patient took his medications due to AMS.  -resume oral metoprolol  #Sinus Tachycardia #QT Prolongation Hx of sinus tachycardia, will restart home metoprolol 100 mg BID orally.  -continue to monitor on tele -avoid qtc prolonging medications  #Acute Encephalopathy #Anion Gap Metabolic Acidosis Patient continues to be altered during dialysis session. BUN elevated 82 from 66. Suspect this is contributing to patient's metabolic encephalopathy. Will reassess after dialysis session. -continue to monitor mental status -hold centrally acting medications -patient without neurological deficits -dialysis today  #ESRD on Dialysis MWF #Hx of Failed Kidney Transplant  2018 #Hyperkalemia Dialysis today. Will trend electrolytes -continue prednisone and tacrolimus in context of renal transplant  #Leukocytosis WBC of 20 upon admission, improved to 9.5 this am. Suspect secondary to stress reaction -continue to monitor, daily cbc  #Left Transmetatarsal Transection 06/2020 #Right BKA 07/2019 #PVD Patient with history of PVD leading to right BKA. Admitted at [facility] 07/17/2020-07/28/2020 for left transmetatarsal transection. At discharge patient advised to attend SNF facility however he requested to be discharged home. -wound care eval, pending recommendations -consider SNF placement at discharge  #Splenic Infarct Currently on warfarin. INR of 2.1, theraputic -hold anticoagulants in setting of hgb of 6.7  #Hx of HTN Home medications of amlodipine, lisinopril. HTN improving -continue home amlodipine, restart metoprolol -holding home lisinopril in setting hyperkalemia  Diet: Renal IVF: None,None VTE: SCDs Code: Full  Dispo: With recent transmetatarsal amputation, would recommend patient be admitted to SNF facility.   Sanjuana Letters DO Internal Medicine Resident PGY-1 Pager [phone] Please contact the on call pager after 5 pm and on weekends at [phone].

## 2020-08-02 NOTE — ED Notes (Signed)
RN could not obtain labs

## 2020-08-02 NOTE — Consult Note (Addendum)
[place] Nurse Consult Note: Reason for Consult: Consult requested for left foot. Pt states he recently had foot surgery performed  on 12/30 at another facility and has not been to a post-op follow-up appointment yet with the surgeon. Wound type: Full thickness post-op incision from recent transmetatarsal amputation; sutures are intact and well-approximated to left foot stump. No erythremia or edema. Dressing procedure/placement/frequency: Topical treatment orders provided for bedside nurses to perform as follows to promote healing: Apply xeroform gauze to left foot Q day, then cover with foam dressing.  (Change foam dressing Q 3 days or PRN soiling.) Pt should resume follow-up with his surgeon after discharge for removal of sutures.  Please re-consult if further assistance is needed.  Thank-you,  Julien Girt MSN, [place], [place], [place], [place]

## 2020-08-02 NOTE — Progress Notes (Signed)
[facility] Progress Note    Assessment/ Plan:   ESRD:  -outpatient orders [facility]: F200, 3.5HRS, 350/700. 2K/2.5Ca. Heparin 1k units bolus -HD tomorrow, maintain on outpatient mwf schedule  Hyperkalemia: improved. Per review of his admission at [facility], seems to be happening pretty frequently.  May very well need Lokelma on off dialysis days.  Monitor for now  Volume/ hypertension/pulmonary edema: -edw 69kg, UF as tolerated. Resume home anti-htns  Anemia of Chronic Kidney Disease: Hemoglobin 9.9, iron panel ordered -aranesp 52mcg every Wed -venofer once every 2 weeks -stool occult positive, mgmt per primary service -recommend checking b12, folate. Macrocytic on cbc  Secondary Hyperparathyroidism/Hyperphosphatemia: resume home binders (on renvela), monitor phos and PTH  -hectorol 4.59mcg qtreatment  Vascular access: rue avf +b/t  # Additional recommendations: - Dose all meds for creatinine clearance <10 ml/min  - Unless absolutely necessary, no MRIs with gadolinium.  - Implement save arm precautions. Prefer needle sticks in the dorsum of the hands or wrists. No blood pressure measurements in arm. - If blood transfusion is requested during hemodialysis sessions, please alert [place] prior to the session.  - If a hemodialysis catheter line culture is requested, please alert [place] as only hemodialysis nurses are able to collect those specimens.   Gean Quint, MD [facility]  Subjective:   S/p ihd earlier today, net uf 3062cc. Tolerated treatment well. Feels better from a breathing perspective. He reports like he needs dialysis again tomorrow.   Objective:   BP (!) 148/92   Pulse (!) 106   Temp 98.5 F (36.9 C) (Oral)   Resp 20   SpO2 96%   Intake/Output Summary (Last 24 hours) at 08/02/2020 1634 Last data filed at 08/02/2020 1045 Gross per 24 hour  Intake 100 ml  Output 3062 ml  Net -2962 ml   Weight change:   Physical  Exam: Gen:nad CVS:reg rate Resp: unlabored, no iwob GHW:EXHB, nt/nd Ext:no edema Neuro: speech clear and coherent, moves all ext spontaneously Access: rue avf +b/t  Imaging: DG Chest Portable 1 View  Result Date: 08/01/2020 CLINICAL DATA:  Shortness of breath EXAM: PORTABLE CHEST 1 VIEW COMPARISON:  11/02/2019 FINDINGS: Cardiomegaly. Atherosclerotic calcification of the aortic knob. Pulmonary vascular congestion with diffuse bilateral interstitial opacities. More confluent airspace disease within the lung bases, right worse than left. Possible trace right pleural effusion. No pneumothorax. IMPRESSION: Cardiomegaly with bilateral interstitial opacities, likely pulmonary edema. A superimposed infectious process would be difficult to exclude, particularly at the right lung base. Electronically Signed   By: Davina Poke D.O.   On: 08/01/2020 10:50   CT Angio Chest/Abd/Pel for Dissection W and/or W/WO  Result Date: 08/02/2020 CLINICAL DATA:  Chest pain and back pain. Concern for aortic dissection. Pain status post fall 2 days ago with left toe amputation 1 week ago. EXAM: CT ANGIOGRAPHY CHEST, ABDOMEN AND PELVIS TECHNIQUE: Non-contrast CT of the chest was initially obtained. Multidetector CT imaging through the chest, abdomen and pelvis was performed using the standard protocol during bolus administration of intravenous contrast. Multiplanar reconstructed images and MIPs were obtained and reviewed to evaluate the vascular anatomy. CONTRAST:  117mL OMNIPAQUE IOHEXOL 350 MG/ML SOLN COMPARISON:  CT of the pelvis dated 10/28/2019. FINDINGS: CTA CHEST FINDINGS Cardiovascular: There is no evidence for a thoracic aortic dissection or aneurysm. There are atherosclerotic changes of the thoracic aorta. There is no evidence for an acute pulmonary embolism. The heart size is moderately enlarged. There is a trace pericardial effusion. The arch vessels  are patent where visualized. Mediastinum/Nodes: --there is mild  mediastinal adenopathy, likely reactive. -- No hilar lymphadenopathy. -- No axillary lymphadenopathy. -- No supraclavicular lymphadenopathy. -- Normal thyroid gland where visualized. -  Unremarkable esophagus. Lungs/Pleura: There are small bilateral pleural effusions with adjacent atelectasis. There is diffuse bilateral interlobular septal thickening with hazy bilateral ground-glass airspace opacification. There is no definite pneumothorax. Musculoskeletal: No chest wall abnormality. No bony spinal canal stenosis. Review of the MIP images confirms the above findings. CTA ABDOMEN AND PELVIS FINDINGS VASCULAR Aorta: Normal caliber aorta without aneurysm, dissection, vasculitis or significant stenosis. Atherosclerotic changes are noted. Celiac: Patent without evidence of aneurysm, dissection, vasculitis or significant stenosis. SMA: Patent without evidence of aneurysm, dissection, vasculitis or significant stenosis. Renals: Both renal arteries are highly stenotic. IMA: Patent without evidence of aneurysm, dissection, vasculitis or significant stenosis. Inflow: Patent without evidence of aneurysm, dissection, vasculitis or significant stenosis. Veins: No obvious venous abnormality within the limitations of this arterial phase study. Review of the MIP images confirms the above findings. NON-VASCULAR Hepatobiliary: Small hepatic cysts are again noted. Status post cholecystectomy.There is no biliary ductal dilation. Pancreas: Normal contours without ductal dilatation. No peripancreatic fluid collection. Spleen: The spleen is borderline enlarged. Adrenals/Urinary Tract: --Adrenal glands: Unremarkable. --Right kidney/ureter: The right kidney is atrophic. --Left kidney/ureter: The left kidney is atrophic. There is a growing exophytic nodule rising from the lower pole measuring approximately 25 Hounsfield units and 2.1 cm. The left kidney is atrophic. There is a right pelvic transplant kidney in place with evidence for  cortical thinning. There is no hydronephrosis. --Urinary bladder: Unremarkable. Stomach/Bowel: --Stomach/Duodenum: No hiatal hernia or other gastric abnormality. Normal duodenal course and caliber. --Small bowel: Unremarkable. --Colon: Unremarkable. --Appendix: Normal. Lymphatic: --No retroperitoneal lymphadenopathy. --No mesenteric lymphadenopathy. --there are few mildly enlarged pelvic lymph nodes which are presumably reactive in etiology. Reproductive: Not well evaluated secondary to streak artifact. Other: No ascites or free air. The abdominal wall is normal. Musculoskeletal. The patient is status post total hip arthroplasty on the right. Review of the MIP images confirms the above findings. IMPRESSION: 1. No acute vascular abnormality. 2. Cardiomegaly with small bilateral pleural effusions and findings consistent with developing pulmonary edema. An underlying atypical infectious process is not entirely excluded. 3. Indeterminate growing exophytic nodule rising from the lower pole the left kidney. This is favored to represent a proteinaceous or hemorrhagic cyst, however follow-up with an outpatient renal ultrasound is recommended. 4. Atrophic native kidneys. The right pelvic transplant kidney demonstrates cortical thinning without hydronephrosis. Findings could indicate transplant failure. 5. Additional chronic findings as detailed above. Aortic Atherosclerosis (ICD10-I70.0). Electronically Signed   By: Constance Holster M.D.   On: 08/02/2020 01:01    Labs: BMET Recent Labs  Lab 08/01/20 1020 08/01/20 1026 08/02/20 0628  NA 134* 136  135 135  K 6.0* 5.8*  5.9* 5.3*  CL 95* 100 95*  CO2 18*  --  20*  GLUCOSE 82 87 81  BUN 69* 66* 82*  CREATININE 12.15* 12.20* 14.21*  CALCIUM 9.8  --  9.4  PHOS  --   --  5.7*   CBC Recent Labs  Lab 08/01/20 1020 08/01/20 1026 08/02/20 0657  WBC 20.6*  --  9.5  HGB 8.8* 9.9*  9.2* 6.7*  HCT 27.6* 29.0*  27.0* 20.4*  MCV 114.5*  --  109.1*  PLT 324   --  163    Medications:    . sodium chloride   Intravenous Once  . amLODipine  10 mg  Oral Daily  . Chlorhexidine Gluconate Cloth  6 each Topical Q0600  . insulin aspart  0-6 Units Subcutaneous TID WC  . metoprolol tartrate  50 mg Oral BID  . predniSONE  5 mg Oral Q breakfast  . tacrolimus  0.5 mg Oral Daily      Gean Quint, MD [facility] 08/02/2020, 4:34 PM

## 2020-08-02 NOTE — ED Notes (Signed)
internal medicine paged

## 2020-08-02 NOTE — ED Notes (Addendum)
Spoke with Dr. Myrtie Hawk; verbal order to give another 5 mg of metoprolol now; Dr. Myrtie Hawk also made aware of pt's continues confusion and anxiety

## 2020-08-02 NOTE — Progress Notes (Signed)
Pt.'s Hgb 6.7, Nephrologist called and ordered to call primary. Sent msg to primary.

## 2020-08-02 NOTE — Progress Notes (Signed)
EKG reviewed - narrow complex tachycardia 153bpm with nonspecific STT changes - computer calling this afib but p waves seen in inferior leads and V3. Telemetry shows similar findings including periods of slightly slower HR in the 130s with more clearly sinus tach. Dr. Acie Fredrickson could not exclude atrial flutter contributing so I reviewed tele findings and EKG with EP MD who feels based on appearance this is likely still sinus tachycardia (long RP tachycardia) so would treat as such and treat underlying issues. Harvey Matlack PA-C

## 2020-08-02 NOTE — Progress Notes (Addendum)
Progress Note  Patient Name: James [name] Date of Encounter: 08/02/2020  Primary Cardiologist: Mertie Moores, MD  Subjective   Sleepy - easily arousable but quickly [place] back asleep. Denies CP/SOB. Just returned from HD. Per nurse just before he settled down, he was very anxious.  Inpatient Medications    Scheduled Meds: . sodium chloride   Intravenous Once  . amLODipine  10 mg Oral Daily  . Chlorhexidine Gluconate Cloth  6 each Topical Q0600  . insulin aspart  0-6 Units Subcutaneous TID WC  . metoprolol tartrate  5 mg Intravenous Q4H  . predniSONE  5 mg Oral Q breakfast  . tacrolimus  0.5 mg Oral Daily   Continuous Infusions:  PRN Meds: acetaminophen **OR** acetaminophen, polyethylene glycol   Vital Signs    Vitals:   08/02/20 1016 08/02/20 1026 08/02/20 1043 08/02/20 1045  BP: (!) 159/106 (!) 153/111 (!) 164/124 (!) 141/107  Pulse: (!) 151 (!) 151 (!) 154   Resp: (!) 21 (!) 25 (!) 26 (!) 23  Temp: 98 F (36.7 C) 98.9 F (37.2 C) 98.3 F (36.8 C) 98.4 F (36.9 C)  TempSrc: Axillary Axillary Axillary Axillary  SpO2:    97%    Intake/Output Summary (Last 24 hours) at 08/02/2020 1225 Last data filed at 08/02/2020 1045 Gross per 24 hour  Intake 100 ml  Output 3062 ml  Net -2962 ml   Last 3 Weights 08/02/2020 04/25/2020 04/22/2020  Weight (lbs) (No Data) 147 lb 14.9 oz 148 lb  Weight (kg) (No Data) 67.1 kg 67.132 kg     Telemetry    Sinus tach 130s-160s - Personally Reviewed  Physical Exam   GEN: No acute distress.  HEENT: Normocephalic, atraumatic, sclera non-icteric. Neck: No JVD or bruits. Cardiac: Rapid, regular, no murmurs, rubs, or gallops. Respiratory: Clear to auscultation bilaterally. Breathing is unlabored. GI: Soft, nontender, non-distended, BS +x 4. Extremities: No clubbing or cyanosis. S/p remote R BKA and recent L TMA with sutures still in place Neuro:  Sleepy but arousable, follows commands briefly before falling back  asleep Psych:unable to assess  Labs    High Sensitivity Troponin:   Recent Labs  Lab 08/01/20 1020 08/01/20 1555 08/01/20 1828  TROPONINIHS 1,049* 1,925* 1,964*      Cardiac EnzymesNo results for input(s): TROPONINI in the last 168 hours. No results for input(s): TROPIPOC in the last 168 hours.   Chemistry Recent Labs  Lab 08/01/20 1020 08/01/20 1026 08/02/20 0628  NA 134* 136  135 135  K 6.0* 5.8*  5.9* 5.3*  CL 95* 100 95*  CO2 18*  --  20*  GLUCOSE 82 87 81  BUN 69* 66* 82*  CREATININE 12.15* 12.20* 14.21*  CALCIUM 9.8  --  9.4  PROT  --   --  6.6  ALBUMIN  --   --  2.4*  2.4*  AST  --   --  38  ALT  --   --  35  ALKPHOS  --   --  209*  BILITOT  --   --  0.7  GFRNONAA 5*  --  4*  ANIONGAP 21*  --  20*     Hematology Recent Labs  Lab 08/01/20 1020 08/01/20 1026 08/02/20 0657  WBC 20.6*  --  9.5  RBC 2.41*  --  1.87*  HGB 8.8* 9.9*  9.2* 6.7*  HCT 27.6* 29.0*  27.0* 20.4*  MCV 114.5*  --  109.1*  MCH 36.5*  --  35.8*  MCHC  31.9  --  32.8  RDW 14.3  --  14.3  PLT 324  --  163    BNPNo results for input(s): BNP, PROBNP in the last 168 hours.   DDimer No results for input(s): DDIMER in the last 168 hours.   Radiology    DG Chest Portable 1 View  Result Date: 08/01/2020 CLINICAL DATA:  Shortness of breath EXAM: PORTABLE CHEST 1 VIEW COMPARISON:  11/02/2019 FINDINGS: Cardiomegaly. Atherosclerotic calcification of the aortic knob. Pulmonary vascular congestion with diffuse bilateral interstitial opacities. More confluent airspace disease within the lung bases, right worse than left. Possible trace right pleural effusion. No pneumothorax. IMPRESSION: Cardiomegaly with bilateral interstitial opacities, likely pulmonary edema. A superimposed infectious process would be difficult to exclude, particularly at the right lung base. Electronically Signed   By: Davina Poke D.O.   On: 08/01/2020 10:50   CT Angio Chest/Abd/Pel for Dissection W and/or  W/WO  Result Date: 08/02/2020 CLINICAL DATA:  Chest pain and back pain. Concern for aortic dissection. Pain status post fall 2 days ago with left toe amputation 1 week ago. EXAM: CT ANGIOGRAPHY CHEST, ABDOMEN AND PELVIS TECHNIQUE: Non-contrast CT of the chest was initially obtained. Multidetector CT imaging through the chest, abdomen and pelvis was performed using the standard protocol during bolus administration of intravenous contrast. Multiplanar reconstructed images and MIPs were obtained and reviewed to evaluate the vascular anatomy. CONTRAST:  129mL OMNIPAQUE IOHEXOL 350 MG/ML SOLN COMPARISON:  CT of the pelvis dated 10/28/2019. FINDINGS: CTA CHEST FINDINGS Cardiovascular: There is no evidence for a thoracic aortic dissection or aneurysm. There are atherosclerotic changes of the thoracic aorta. There is no evidence for an acute pulmonary embolism. The heart size is moderately enlarged. There is a trace pericardial effusion. The arch vessels are patent where visualized. Mediastinum/Nodes: --there is mild mediastinal adenopathy, likely reactive. -- No hilar lymphadenopathy. -- No axillary lymphadenopathy. -- No supraclavicular lymphadenopathy. -- Normal thyroid gland where visualized. -  Unremarkable esophagus. Lungs/Pleura: There are small bilateral pleural effusions with adjacent atelectasis. There is diffuse bilateral interlobular septal thickening with hazy bilateral ground-glass airspace opacification. There is no definite pneumothorax. Musculoskeletal: No chest wall abnormality. No bony spinal canal stenosis. Review of the MIP images confirms the above findings. CTA ABDOMEN AND PELVIS FINDINGS VASCULAR Aorta: Normal caliber aorta without aneurysm, dissection, vasculitis or significant stenosis. Atherosclerotic changes are noted. Celiac: Patent without evidence of aneurysm, dissection, vasculitis or significant stenosis. SMA: Patent without evidence of aneurysm, dissection, vasculitis or significant  stenosis. Renals: Both renal arteries are highly stenotic. IMA: Patent without evidence of aneurysm, dissection, vasculitis or significant stenosis. Inflow: Patent without evidence of aneurysm, dissection, vasculitis or significant stenosis. Veins: No obvious venous abnormality within the limitations of this arterial phase study. Review of the MIP images confirms the above findings. NON-VASCULAR Hepatobiliary: Small hepatic cysts are again noted. Status post cholecystectomy.There is no biliary ductal dilation. Pancreas: Normal contours without ductal dilatation. No peripancreatic fluid collection. Spleen: The spleen is borderline enlarged. Adrenals/Urinary Tract: --Adrenal glands: Unremarkable. --Right kidney/ureter: The right kidney is atrophic. --Left kidney/ureter: The left kidney is atrophic. There is a growing exophytic nodule rising from the lower pole measuring approximately 25 Hounsfield units and 2.1 cm. The left kidney is atrophic. There is a right pelvic transplant kidney in place with evidence for cortical thinning. There is no hydronephrosis. --Urinary bladder: Unremarkable. Stomach/Bowel: --Stomach/Duodenum: No hiatal hernia or other gastric abnormality. Normal duodenal course and caliber. --Small bowel: Unremarkable. --Colon: Unremarkable. --Appendix: Normal. Lymphatic: --No retroperitoneal  lymphadenopathy. --No mesenteric lymphadenopathy. --there are few mildly enlarged pelvic lymph nodes which are presumably reactive in etiology. Reproductive: Not well evaluated secondary to streak artifact. Other: No ascites or free air. The abdominal wall is normal. Musculoskeletal. The patient is status post total hip arthroplasty on the right. Review of the MIP images confirms the above findings. IMPRESSION: 1. No acute vascular abnormality. 2. Cardiomegaly with small bilateral pleural effusions and findings consistent with developing pulmonary edema. An underlying atypical infectious process is not entirely  excluded. 3. Indeterminate growing exophytic nodule rising from the lower pole the left kidney. This is favored to represent a proteinaceous or hemorrhagic cyst, however follow-up with an outpatient renal ultrasound is recommended. 4. Atrophic native kidneys. The right pelvic transplant kidney demonstrates cortical thinning without hydronephrosis. Findings could indicate transplant failure. 5. Additional chronic findings as detailed above. Aortic Atherosclerosis (ICD10-I70.0). Electronically Signed   By: Constance Holster M.D.   On: 08/02/2020 01:01    Cardiac Studies   2D echo pending  Patient Profile     50 y.o. 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 Complex PMH as outlined in consult note. Recently in the hospital for ischemic foot s/p L TMA. He presented to the hospital 08/01/2020 after having a fall and being found confused by his aide that arrived, also with some recollection of having had chest pain. He had been confirmed compliant with last scheduled HD session on Friday. He was hypertensive and tachycardic (sinus) on arrival. He required BiPAP on arrival and quickly improved with NTG paste and BiPAP. CT angio ruled out dissection. Cardiology asked to see for elevated troponin. Found to have more anemia this morning.  Assessment & Plan    1. Acute encephalopathy with acute respiratory distress and pulmonary edema requiring BiPAP - etiology of initial event unclear, patient wonders if he took too many narcotics unintentionally for his recent amputation - seems more lethargic this  AM - have notified primary team  2. Chest pain with suspected NSTEMI  - does not account for entire presentation with AMS - hsTroponin 7824 - 1925 - 1964 which seems higher than usual demand ischemia process - may need ischemic workup this admission when more clinically stable - CT angio without evidence for dissection, see above for other findings - await echocardiogram  3. Sinus tachycardia -  Baseline HR was 99-120 in recent [facility] notes - 130s-160s this AM, received several doses of IV metoprolol - did not get home metoprolol yet restarted which likely accounts for some of his tachycardia - scheduled IV BB ordered but will get back on oral form 100mg  BID and follow HRs - anemia also felt to be contributing - check TSH - ordered yesterday but never completed  4. Acute on chronic anemia - Hgb drop from 8-9 range to 5.7 this AM - primary team managing  5. ESRD on hemodialysis, also with hyperkalemia - per nephrology - would avoid ACEi going forward due to K  6. PVD s/p R BKA 07/2019, L TMA 06/2020 - per IM - on ASA, held by primary team for now  7. Splenic infarct, on Coumadin prior to admission - INR therapeutic on admission - heparin  per pharmacy was ordered for when INR <2.0 but primary team holding for now given his worsening anemia  8. Prolonged QT interval on EKG - avoid additional QT prolonging agents - f/u EKG   9. Leukocytosis of unclear etiology - on empiric abx  For questions or updates, please contact [place] Please consult [email or web address] for contact info under Cardiology/STEMI.  Signed, Charlie Pitter, PA-C 08/02/2020, 12:25 PM    Attending Note:   The patient was seen and examined.  Agree with assessment and plan as noted above.  Changes made to the above note as needed.  Patient seen and independently examined with Melina Copa, PA .   We discussed all aspects of the encounter. I agree with the assessment and plan as stated above.  1.   Acute  encepholopathy:   hes a bit more confused today  Further management per IM   2.   Chest pain :  Likely demand ischemia .  With his sudden drop of Hb he is not a candidate for interventional cardiology procedures at this point.   3 sinus tach:   Will restart metoprolol.   Since his BP is marginal , will give only metoprolol 50 mg today .  Can supplement with IV metoprolol as needed.   4. Anemia:  Plans per IM team     I have spent a total of 40 minutes with patient reviewing hospital  notes , telemetry, EKGs, labs and examining patient as well as establishing an assessment and plan that was discussed with the patient. > 50% of time was spent in direct patient care.    Thayer Headings, Brooke Bonito., MD, [facility] 08/02/2020, 1:06 PM [address],  [place] Pager [phone]

## 2020-08-03 ENCOUNTER — Inpatient Hospital Stay (HOSPITAL_COMMUNITY): Payer: Medicare Other

## 2020-08-03 DIAGNOSIS — J810 Acute pulmonary edema: Secondary | ICD-10-CM | POA: Diagnosis not present

## 2020-08-03 DIAGNOSIS — D509 Iron deficiency anemia, unspecified: Secondary | ICD-10-CM | POA: Diagnosis not present

## 2020-08-03 DIAGNOSIS — R079 Chest pain, unspecified: Secondary | ICD-10-CM

## 2020-08-03 DIAGNOSIS — R000 Tachycardia, unspecified: Secondary | ICD-10-CM | POA: Diagnosis not present

## 2020-08-03 DIAGNOSIS — G9340 Encephalopathy, unspecified: Secondary | ICD-10-CM | POA: Diagnosis not present

## 2020-08-03 LAB — RENAL FUNCTION PANEL
Albumin: 2.5 g/dL — ABNORMAL LOW (ref 3.5–5.0)
Anion gap: 14 (ref 5–15)
BUN: 10 mg/dL (ref 6–20)
CO2: 27 mmol/L (ref 22–32)
Calcium: 9.2 mg/dL (ref 8.9–10.3)
Chloride: 95 mmol/L — ABNORMAL LOW (ref 98–111)
Creatinine, Ser: 2.94 mg/dL — ABNORMAL HIGH (ref 0.61–1.24)
GFR, Estimated: 26 mL/min — ABNORMAL LOW (ref >60)
Glucose, Bld: 87 mg/dL (ref 70–99)
Phosphorus: 1.9 mg/dL — ABNORMAL LOW (ref 2.5–4.6)
Potassium: 2.9 mmol/L — ABNORMAL LOW (ref 3.5–5.1)
Sodium: 136 mmol/L (ref 135–145)

## 2020-08-03 LAB — TYPE AND SCREEN
ABO/RH(D): O POS
Antibody Screen: NEGATIVE
Unit division: 0

## 2020-08-03 LAB — FOLATE: Folate: 21.2 ng/mL (ref >5.9)

## 2020-08-03 LAB — CBC
HCT: 24.7 % — ABNORMAL LOW (ref 39.0–52.0)
HCT: 23.9 % — ABNORMAL LOW (ref 39.0–52.0)
Hemoglobin: 8.3 g/dL — ABNORMAL LOW (ref 13.0–17.0)
Hemoglobin: 7.8 g/dL — ABNORMAL LOW (ref 13.0–17.0)
MCH: 35.2 pg — ABNORMAL HIGH (ref 26.0–34.0)
MCH: 34.7 pg — ABNORMAL HIGH (ref 26.0–34.0)
MCHC: 33.6 g/dL (ref 30.0–36.0)
MCHC: 32.6 g/dL (ref 30.0–36.0)
MCV: 104.7 fL — ABNORMAL HIGH (ref 80.0–100.0)
MCV: 106.2 fL — ABNORMAL HIGH (ref 80.0–100.0)
Platelets: 205 10*3/uL (ref 150–400)
Platelets: 178 10*3/uL (ref 150–400)
RBC: 2.36 MIL/uL — ABNORMAL LOW (ref 4.22–5.81)
RBC: 2.25 MIL/uL — ABNORMAL LOW (ref 4.22–5.81)
RDW: 16.4 % — ABNORMAL HIGH (ref 11.5–15.5)
RDW: 16 % — ABNORMAL HIGH (ref 11.5–15.5)
WBC: 10.3 10*3/uL (ref 4.0–10.5)
WBC: 9.1 10*3/uL (ref 4.0–10.5)
nRBC: 0 % (ref 0.0–0.2)
nRBC: 0 % (ref 0.0–0.2)

## 2020-08-03 LAB — BASIC METABOLIC PANEL
Anion gap: 18 — ABNORMAL HIGH (ref 5–15)
BUN: 41 mg/dL — ABNORMAL HIGH (ref 6–20)
CO2: 25 mmol/L (ref 22–32)
Calcium: 9.5 mg/dL (ref 8.9–10.3)
Chloride: 92 mmol/L — ABNORMAL LOW (ref 98–111)
Creatinine, Ser: 8.8 mg/dL — ABNORMAL HIGH (ref 0.61–1.24)
GFR, Estimated: 7 mL/min — ABNORMAL LOW (ref >60)
Glucose, Bld: 85 mg/dL (ref 70–99)
Potassium: 4.2 mmol/L (ref 3.5–5.1)
Sodium: 135 mmol/L (ref 135–145)

## 2020-08-03 LAB — ECHOCARDIOGRAM LIMITED
S' Lateral: 4.1 cm
Weight: 2215.18 oz

## 2020-08-03 LAB — BPAM RBC
Blood Product Expiration Date: 202202112359
ISSUE DATE / TIME: 202201111012
Unit Type and Rh: 5100

## 2020-08-03 LAB — GLUCOSE, CAPILLARY
Glucose-Capillary: 73 mg/dL (ref 70–99)
Glucose-Capillary: 77 mg/dL (ref 70–99)
Glucose-Capillary: 80 mg/dL (ref 70–99)
Glucose-Capillary: 86 mg/dL (ref 70–99)

## 2020-08-03 LAB — PHOSPHORUS: Phosphorus: 4.5 mg/dL (ref 2.5–4.6)

## 2020-08-03 LAB — VITAMIN B12: Vitamin B-12: 3009 pg/mL — ABNORMAL HIGH (ref 180–914)

## 2020-08-03 LAB — MAGNESIUM: Magnesium: 1.9 mg/dL (ref 1.7–2.4)

## 2020-08-03 MED ORDER — LORAZEPAM 2 MG/ML IJ SOLN
1.0000 mg | Freq: Once | INTRAMUSCULAR | Status: AC
  Administered 2020-08-03: 1 mg via INTRAVENOUS
  Filled 2020-08-03: qty 1

## 2020-08-03 MED ORDER — METOPROLOL TARTRATE 50 MG PO TABS
50.0000 mg | ORAL_TABLET | Freq: Four times a day (QID) | ORAL | Status: DC
  Administered 2020-08-03 – 2020-08-08 (×19): 50 mg via ORAL
  Filled 2020-08-03 (×19): qty 1

## 2020-08-03 MED ORDER — LORAZEPAM BOLUS VIA INFUSION: 1.0000 mg | Freq: Once | INTRAVENOUS | Status: DC

## 2020-08-03 MED ORDER — VANCOMYCIN HCL 1250 MG/250ML IV SOLN
1250.0000 mg | Freq: Once | INTRAVENOUS | Status: AC
  Administered 2020-08-03: 1250 mg via INTRAVENOUS
  Filled 2020-08-03: qty 250

## 2020-08-03 MED ORDER — METOPROLOL TARTRATE 5 MG/5ML IV SOLN
INTRAVENOUS | Status: AC
  Filled 2020-08-03: qty 10

## 2020-08-03 MED ORDER — VANCOMYCIN HCL 750 MG/150ML IV SOLN
750.0000 mg | INTRAVENOUS | Status: DC
  Filled 2020-08-03: qty 150

## 2020-08-03 MED ORDER — SODIUM CHLORIDE 0.9 % IV SOLN
2.0000 g | INTRAVENOUS | Status: DC
  Administered 2020-08-03 – 2020-08-07 (×5): 2 g via INTRAVENOUS
  Filled 2020-08-03: qty 20
  Filled 2020-08-03 (×2): qty 0.08
  Filled 2020-08-03: qty 2
  Filled 2020-08-03 (×2): qty 20

## 2020-08-03 NOTE — Progress Notes (Incomplete)
Pt HR >170 sustained SVT; called Dr. Gean Quint, upon arrival to the unit he ordered Lopressor 10mg  IV,  EKG and to stop UF. He also ordered to call Rapid Response. Dr. Gean Quint and Kennis Carina, Rapid response arrive at the same time. Dr. Candiss Norse ordered pt to bear down and HR dropped to 120's with UF off and He ordered to continue HD and stop if HR increases. Pt resting and HR

## 2020-08-03 NOTE — Progress Notes (Addendum)
Refuses to keep monitors on and keeps removing cardiac monitor and pulse oximetry.Refused BG check.  C/o being hot and taking gown off. Adjusted temperature in room

## 2020-08-03 NOTE — CV Procedure (Signed)
Echocardiogram not completed, patient is going to dialysis. Will be re-attempted at a later time.  Darlina Sicilian RDCS

## 2020-08-03 NOTE — Progress Notes (Signed)
[facility] Progress Note    Assessment/ Plan:   ESRD:  -outpatient orders [facility]: F200, 3.5HRS, 350/700. 2K/2.5Ca. Heparin 1k units bolus -maintain MWF sched here -no heparin here  Hyperkalemia: improved. Per review of his admission at [facility], seems to be happening pretty frequently.  May very well need Lokelma on off dialysis days.  Monitor for now  Tachyarrhythmia -rapid response called in dialysis unit 1/12 -BP stable (hypertensive) -labs ordered (RFP, CBC, Mag) -did not get beta blocker this AM -held on lopressor IV for now given that HR improved -holding further UF for today. Reassess at next HD treatment which will be on Friday -cardio on board, appreciate recommendations -primary team informed  Volume/ hypertension/pulmonary edema: -edw 69kg, UF as tolerated. Resume home anti-htns  Anemia of Chronic Kidney Disease: Hemoglobin 9.9, iron panel ordered -aranesp 54mcg every Wed -venofer once every 2 weeks -1u prbc on 1/11 -stool occult positive, mgmt per primary service -folate pending, b12 ok (macrocytic)  Secondary Hyperparathyroidism/Hyperphosphatemia: resume home binders (on renvela), monitor phos and PTH  -hectorol 4.29mcg qtreatment  AMS/encephalopathy -persistent despite HD, possibly another etiology rather than uremic encephalopathy. Mgmt per primary service  Vascular access: rue avf +b/t  # Additional recommendations: - Dose all meds for creatinine clearance <10 ml/min  - Unless absolutely necessary, no MRIs with gadolinium.  - Implement save arm precautions. Prefer needle sticks in the dorsum of the hands or wrists. No blood pressure measurements in arm. - If blood transfusion is requested during hemodialysis sessions, please alert [place] prior to the session.  - If a hemodialysis catheter line culture is requested, please alert [place] as only hemodialysis nurses are able to collect those specimens.   Gean Quint,  MD [facility]  Subjective:   Seen on hd earlier.  No complaints at this time. Increased UFG to 4L, hypertensive. Recalled to dialysis unit due to change in HR. Tachycardic into the 170's, sustained. RR called. HR improved with vasovagal maneuvers, now hovering between high 110's to 140's. UF turned off, UF'ed only 1L today. Did not get AM lopressor dose   Objective:   BP (!) 195/103   Pulse (!) 113   Temp 98.5 F (36.9 C) (Oral)   Resp 17   SpO2 99%   Intake/Output Summary (Last 24 hours) at 08/03/2020 1146 Last data filed at 08/03/2020 0000 Gross per 24 hour  Intake 240 ml  Output --  Net 240 ml   Weight change:   Physical Exam: Gen:nad CVS:reg rate Resp: unlabored, no iwob LKG:MWNU, nt/nd Ext:no edema Neuro: speech clear and coherent, moves all ext spontaneously, intermittently confused Access: rue avf +b/t  Imaging: CT HEAD WO CONTRAST  Result Date: 08/02/2020 CLINICAL DATA:  Mental status changes of unknown cause, acute encephalopathy suspect metabolic, recent splenic infarct; history end-stage renal disease, hypertension, B-cell lymphoproliferative disorder, type II diabetes mellitus, smoker EXAM: CT HEAD WITHOUT CONTRAST TECHNIQUE: Contiguous axial images were obtained from the base of the skull through the vertex without intravenous contrast. Sagittal and coronal MPR images reconstructed from axial data set. COMPARISON:  None FINDINGS: Brain: Normal ventricular morphology. No midline shift or mass effect. Normal appearance of brain parenchyma. No intracranial hemorrhage, mass lesion, or evidence of acute infarction. No extra-axial fluid collections. Vascular: No hyperdense vessels. Atherosclerotic calcification of internal carotid and vertebral arteries at skull base. Skull: Intact Sinuses/Orbits: Clear Other: N/A IMPRESSION: No acute intracranial abnormalities. Electronically Signed   By: Lavonia Dana M.D.   On: 08/02/2020 16:32  CT Angio Chest/Abd/Pel  for Dissection W and/or W/WO  Result Date: 08/02/2020 CLINICAL DATA:  Chest pain and back pain. Concern for aortic dissection. Pain status post fall 2 days ago with left toe amputation 1 week ago. EXAM: CT ANGIOGRAPHY CHEST, ABDOMEN AND PELVIS TECHNIQUE: Non-contrast CT of the chest was initially obtained. Multidetector CT imaging through the chest, abdomen and pelvis was performed using the standard protocol during bolus administration of intravenous contrast. Multiplanar reconstructed images and MIPs were obtained and reviewed to evaluate the vascular anatomy. CONTRAST:  169mL OMNIPAQUE IOHEXOL 350 MG/ML SOLN COMPARISON:  CT of the pelvis dated 10/28/2019. FINDINGS: CTA CHEST FINDINGS Cardiovascular: There is no evidence for a thoracic aortic dissection or aneurysm. There are atherosclerotic changes of the thoracic aorta. There is no evidence for an acute pulmonary embolism. The heart size is moderately enlarged. There is a trace pericardial effusion. The arch vessels are patent where visualized. Mediastinum/Nodes: --there is mild mediastinal adenopathy, likely reactive. -- No hilar lymphadenopathy. -- No axillary lymphadenopathy. -- No supraclavicular lymphadenopathy. -- Normal thyroid gland where visualized. -  Unremarkable esophagus. Lungs/Pleura: There are small bilateral pleural effusions with adjacent atelectasis. There is diffuse bilateral interlobular septal thickening with hazy bilateral ground-glass airspace opacification. There is no definite pneumothorax. Musculoskeletal: No chest wall abnormality. No bony spinal canal stenosis. Review of the MIP images confirms the above findings. CTA ABDOMEN AND PELVIS FINDINGS VASCULAR Aorta: Normal caliber aorta without aneurysm, dissection, vasculitis or significant stenosis. Atherosclerotic changes are noted. Celiac: Patent without evidence of aneurysm, dissection, vasculitis or significant stenosis. SMA: Patent without evidence of aneurysm, dissection,  vasculitis or significant stenosis. Renals: Both renal arteries are highly stenotic. IMA: Patent without evidence of aneurysm, dissection, vasculitis or significant stenosis. Inflow: Patent without evidence of aneurysm, dissection, vasculitis or significant stenosis. Veins: No obvious venous abnormality within the limitations of this arterial phase study. Review of the MIP images confirms the above findings. NON-VASCULAR Hepatobiliary: Small hepatic cysts are again noted. Status post cholecystectomy.There is no biliary ductal dilation. Pancreas: Normal contours without ductal dilatation. No peripancreatic fluid collection. Spleen: The spleen is borderline enlarged. Adrenals/Urinary Tract: --Adrenal glands: Unremarkable. --Right kidney/ureter: The right kidney is atrophic. --Left kidney/ureter: The left kidney is atrophic. There is a growing exophytic nodule rising from the lower pole measuring approximately 25 Hounsfield units and 2.1 cm. The left kidney is atrophic. There is a right pelvic transplant kidney in place with evidence for cortical thinning. There is no hydronephrosis. --Urinary bladder: Unremarkable. Stomach/Bowel: --Stomach/Duodenum: No hiatal hernia or other gastric abnormality. Normal duodenal course and caliber. --Small bowel: Unremarkable. --Colon: Unremarkable. --Appendix: Normal. Lymphatic: --No retroperitoneal lymphadenopathy. --No mesenteric lymphadenopathy. --there are few mildly enlarged pelvic lymph nodes which are presumably reactive in etiology. Reproductive: Not well evaluated secondary to streak artifact. Other: No ascites or free air. The abdominal wall is normal. Musculoskeletal. The patient is status post total hip arthroplasty on the right. Review of the MIP images confirms the above findings. IMPRESSION: 1. No acute vascular abnormality. 2. Cardiomegaly with small bilateral pleural effusions and findings consistent with developing pulmonary edema. An underlying atypical infectious  process is not entirely excluded. 3. Indeterminate growing exophytic nodule rising from the lower pole the left kidney. This is favored to represent a proteinaceous or hemorrhagic cyst, however follow-up with an outpatient renal ultrasound is recommended. 4. Atrophic native kidneys. The right pelvic transplant kidney demonstrates cortical thinning without hydronephrosis. Findings could indicate transplant failure. 5. Additional chronic findings as detailed above. Aortic Atherosclerosis (ICD10-I70.0). Electronically  Signed   By: Constance Holster M.D.   On: 08/02/2020 01:01    Labs: BMET Recent Labs  Lab 08/01/20 1020 08/01/20 1026 08/02/20 0628 08/03/20 0249  NA 134* 136  135 135 135  K 6.0* 5.8*  5.9* 5.3* 4.2  CL 95* 100 95* 92*  CO2 18*  --  20* 25  GLUCOSE 82 87 81 85  BUN 69* 66* 82* 41*  CREATININE 12.15* 12.20* 14.21* 8.80*  CALCIUM 9.8  --  9.4 9.5  PHOS  --   --  5.7* 4.5   CBC Recent Labs  Lab 08/01/20 1020 08/01/20 1026 08/02/20 0657 08/02/20 1559 08/03/20 0249  WBC 20.6*  --  9.5 12.7* 10.3  HGB 8.8* 9.9*  9.2* 6.7* 8.5* 8.3*  HCT 27.6* 29.0*  27.0* 20.4* 26.6* 24.7*  MCV 114.5*  --  109.1* 107.3* 104.7*  PLT 324  --  163 214 205    Medications:    . sodium chloride   Intravenous Once  . amLODipine  10 mg Oral Daily  . Chlorhexidine Gluconate Cloth  6 each Topical Q0600  . insulin aspart  0-6 Units Subcutaneous TID WC  . lidocaine  1 patch Transdermal Q24H  . metoprolol tartrate      . metoprolol tartrate  50 mg Oral BID  . predniSONE  5 mg Oral Q breakfast  . tacrolimus  0.5 mg Oral Daily      Gean Quint, MD [facility] 08/03/2020, 11:46 AM

## 2020-08-03 NOTE — Progress Notes (Signed)
HD#2 Subjective:  Overnight Events: CTA negative for dissection. Upon reevaluation yesterday afternoon patient found to be more alert and less confused. He continued to endorse pain in his left foot. He was able to remember some of our conversation from his admission, but continues to be confused.    Patient evaluated while receiving dialysis. Patient endorsed left foot pain. He denied shortness of breath and chest pain.   Objective:  Vital signs in last 24 hours: Vitals:   08/02/20 1528 08/02/20 1936 08/03/20 0000 08/03/20 0300  BP: (!) 148/92 131/78    Pulse: (!) 106  96   Resp:      Temp:  98.5 F (36.9 C)    TempSrc:  Oral    SpO2: 96% 100% 99% 100%   Supplemental O2: Nasal Cannula SpO2: 100 % O2 Flow Rate (L/min): 4 L/min FiO2 (%): 50 %   Physical Exam:  Physical Exam Vitals and nursing note reviewed.  Constitutional:      Comments: Agitated   HENT:     Head: Normocephalic and atraumatic.  Cardiovascular:     Rate and Rhythm: Regular rhythm. Tachycardia present.     Pulses: Normal pulses.     Heart sounds: Normal heart sounds. No murmur heard. No friction rub. No gallop.   Pulmonary:     Effort: Pulmonary effort is normal. No respiratory distress.     Breath sounds: Normal breath sounds.  Abdominal:     Tenderness: There is no abdominal tenderness.  Skin:    General: Skin is warm and dry.  Neurological:     General: No focal deficit present.     Mental Status: He is alert.     Comments: Alert and oriented to self and place. Not time    Filed Weights     Intake/Output Summary (Last 24 hours) at 08/03/2020 0659 Last data filed at 08/03/2020 0000 Gross per 24 hour  Intake 340 ml  Output 3062 ml  Net -2722 ml   Net IO Since Admission: -2,722 mL [08/03/20 0659]  Pertinent Labs: CBC Latest Ref Rng & Units 08/03/2020 08/02/2020 08/02/2020  WBC 4.0 - 10.5 K/uL 10.3 12.7(H) 9.5  Hemoglobin 13.0 - 17.0 g/dL 8.3(L) 8.5(L) 6.7(LL)  Hematocrit 39.0 - 52.0 %  24.7(L) 26.6(L) 20.4(L)  Platelets 150 - 400 K/uL 205 214 163    CMP Latest Ref Rng & Units 08/03/2020 08/02/2020 08/01/2020  Glucose 70 - 99 mg/dL 85 81 -  BUN 6 - 20 mg/dL 41(H) 82(H) -  Creatinine 0.61 - 1.24 mg/dL 8.80(H) 14.21(H) -  Sodium 135 - 145 mmol/L 135 135 136  Potassium 3.5 - 5.1 mmol/L 4.2 5.3(H) 5.8(H)  Chloride 98 - 111 mmol/L 92(L) 95(L) -  CO2 22 - 32 mmol/L 25 20(L) -  Calcium 8.9 - 10.3 mg/dL 9.5 9.4 -  Total Protein 6.5 - 8.1 g/dL - 6.6 -  Total Bilirubin 0.3 - 1.2 mg/dL - 0.7 -  Alkaline Phos 38 - 126 U/L - 209(H) -  AST 15 - 41 U/L - 38 -  ALT 0 - 44 U/L - 35 -    Imaging: CT HEAD WO CONTRAST  Result Date: 08/02/2020 CLINICAL DATA:  Mental status changes of unknown cause, acute encephalopathy suspect metabolic, recent splenic infarct; history end-stage renal disease, hypertension, B-cell lymphoproliferative disorder, type II diabetes mellitus, smoker EXAM: CT HEAD WITHOUT CONTRAST TECHNIQUE: Contiguous axial images were obtained from the base of the skull through the vertex without intravenous contrast. Sagittal and coronal MPR  images reconstructed from axial data set. COMPARISON:  None FINDINGS: Brain: Normal ventricular morphology. No midline shift or mass effect. Normal appearance of brain parenchyma. No intracranial hemorrhage, mass lesion, or evidence of acute infarction. No extra-axial fluid collections. Vascular: No hyperdense vessels. Atherosclerotic calcification of internal carotid and vertebral arteries at skull base. Skull: Intact Sinuses/Orbits: Clear Other: N/A IMPRESSION: No acute intracranial abnormalities. Electronically Signed   By: Lavonia Dana M.D.   On: 08/02/2020 16:32    Assessment/Plan:   Active Problems:   Tachycardia   End stage renal disease (HCC)   Anemia   Renal transplant recipient   Respiratory failure [facility])   Patient Summary:  James [name] is a 50 y.o. with pertinent PMH of  s/p failed transplant on ESRD HD MWF followed by  [facility], lymphoma 2013, HTN, BPH, prostate cancer, T2DM, PVD s/p right BKA and left TMA  who presented with shortness of breath and altered mental status and admitted for acute hypoxic respiratory failure, troponemia, and electrolyte abnormalities.  #Acute Hypoxic Respiratory Failure  #Chest Pain Saturating well on room air, currently denies shortness of breath. Denies new episodes of chest pain.  -continue BiPAP - O2 goal of 90-94% -CTA chest negative for aortic dissection -dialysis per nephro  #Anemia Morning Hgb of 7.8 from 8.3 this am. Uncertain etiology as to where blood loss is coming from. Ferritin of 3,132, iron of 40, TIBC of 147. Folate of 21.2 and B12 of 3,009  Consistent with anemia of chronic disease/possibel acute infection. FOBT +.  -GI consulted, will see pt tomorrow -daily cbc -transfuse hgb under 7.0 -hold anticoagulants  #Sinus Tachycardia #QT Prolongation Hx of sinus tachycardia, resuming home metoprolol 50 mg q6h -continue to monitor on tele -avoid qtc prolonging medications  #Acute Encephalopathy #Anion Gap Metabolic Acidosis Patient continues to be altered post dialysis. BUN dropped from 80's to 40's this am. Will continue to monitor for signs of other etiologies at this time. Patient with history of encephalopathy secondary to pneumonia and uremia in the past. Patient denies alcohol use, spoke with mother who also denies patient using alcohol.  -continue to monitor mental status -patient without neurological deficits -dialysis per nephro.  -daily bmp  #ESRD on Dialysis MWF #Hx of Failed Kidney Transplant 2018 #Hyperkalemia Dialysis today. Will trend electrolytes -continue prednisone and tacrolimus in context of renal transplant -per nephro  #Leukocytosis WBC of 20 upon admission, improved to 9.1. 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 Admitted at [facility] 07/17/2020-07/28/2020 for left transmetatarsal transection. At discharge patient advised to attend SNF facility however he requested to be discharged home. -wound care eval - daily dressing changes, apply xeroform gauze. 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 INR of 2.1, theraputic -hold anticoagulants in setting of hgb of 6.7.  #Hx of HTN Home medications of amlodipine, lisinopril. HTN improving -continue home amlodipine, metoprolol -holding home lisinopril in setting hyperkalemia  Diet: Renal IVF: None,None VTE: SCDs Code: Full  Dispo: With recent transmetatarsal amputation, would recommend patient be admitted to SNF facility.   Sanjuana Letters DO Internal Medicine Resident PGY-1 Pager [phone] Please contact the on call pager after 5 pm and on weekends at [phone].

## 2020-08-03 NOTE — Progress Notes (Signed)
Echocardiogram 2D Echocardiogram limited with color has been performed.  Darlina Sicilian M 08/03/2020, 3:28 PM

## 2020-08-03 NOTE — Progress Notes (Addendum)
Notified by nursing staff, patient with temperature of 102F. Went to to bedside, patient with worsening confusion. Alert to person only. Currently complaining of pain and unable to localize where it is coming from.   In setting of new fever, continued encephalopathy, and possible superimposed infectious process on initial chest x-ray, will order blood cultures and start patient on abx. Upon further chart review, it appears patient had similar episode of shortness of breath and acute encephalopathy in 2018. Both thought to be secondary to pneumonia and uremia.

## 2020-08-03 NOTE — Progress Notes (Addendum)
Progress Note  Patient Name: James [name] Date of Encounter: 08/03/2020  [facility] Cardiologist: Mertie Moores, MD   Subjective   Pt seen in HD, sleeping comfortably. He awakens easily and answers questions. Oriented to place and situation. He denies chest pain and palpitations - HR on telemetry I the 140s, appears sinus tachycardia.  Inpatient Medications    Scheduled Meds: . sodium chloride   Intravenous Once  . amLODipine  10 mg Oral Daily  . Chlorhexidine Gluconate Cloth  6 each Topical Q0600  . insulin aspart  0-6 Units Subcutaneous TID WC  . lidocaine  1 patch Transdermal Q24H  . metoprolol tartrate  50 mg Oral BID  . predniSONE  5 mg Oral Q breakfast  . tacrolimus  0.5 mg Oral Daily   Continuous Infusions:  PRN Meds: acetaminophen **OR** acetaminophen, oxyCODONE, polyethylene glycol   Vital Signs    Vitals:   08/03/20 0920 08/03/20 0925 08/03/20 0930 08/03/20 0935  BP: (!) 200/112   (!) 202/109  Pulse:      Resp: 12 17 16 19   Temp:      TempSrc:      SpO2:        Intake/Output Summary (Last 24 hours) at 08/03/2020 0944 Last data filed at 08/03/2020 0000 Gross per 24 hour  Intake 340 ml  Output 3062 ml  Net -2722 ml   Last 3 Weights 08/02/2020 04/25/2020 04/22/2020  Weight (lbs) (No Data) 147 lb 14.9 oz 148 lb  Weight (kg) (No Data) 67.1 kg 67.132 kg      Telemetry    Sinus tachycardia HR in the 110-140s - Personally Reviewed  ECG    No new tracings - Personally Reviewed  Physical Exam   GEN: No acute distress.   Neck: No JVD Cardiac: regular rhythm, tachycardic rate Respiratory: Clear to auscultation bilaterally. GI: Soft, nontender, non-distended  MS: in HD Neuro:  Nonfocal  Psych: Normal affect   Labs    High Sensitivity Troponin:   Recent Labs  Lab 08/01/20 1020 08/01/20 1555 08/01/20 1828  TROPONINIHS 1,049* 1,925* 1,964*      Chemistry Recent Labs  Lab 08/01/20 1020 08/01/20 1026 08/02/20 0628 08/03/20 0249  NA  134* 136  135 135 135  K 6.0* 5.8*  5.9* 5.3* 4.2  CL 95* 100 95* 92*  CO2 18*  --  20* 25  GLUCOSE 82 87 81 85  BUN 69* 66* 82* 41*  CREATININE 12.15* 12.20* 14.21* 8.80*  CALCIUM 9.8  --  9.4 9.5  PROT  --   --  6.6  --   ALBUMIN  --   --  2.4*  2.4*  --   AST  --   --  38  --   ALT  --   --  35  --   ALKPHOS  --   --  209*  --   BILITOT  --   --  0.7  --   GFRNONAA 5*  --  4* 7*  ANIONGAP 21*  --  20* 18*     Hematology Recent Labs  Lab 08/02/20 0657 08/02/20 1559 08/03/20 0249  WBC 9.5 12.7* 10.3  RBC 1.87* 2.48* 2.36*  HGB 6.7* 8.5* 8.3*  HCT 20.4* 26.6* 24.7*  MCV 109.1* 107.3* 104.7*  MCH 35.8* 34.3* 35.2*  MCHC 32.8 32.0 33.6  RDW 14.3 16.1* 16.4*  PLT 163 214 205    BNPNo results for input(s): BNP, PROBNP in the last 168 hours.  DDimer No results for input(s): DDIMER in the last 168 hours.   Radiology    CT HEAD WO CONTRAST  Result Date: 08/02/2020 CLINICAL DATA:  Mental status changes of unknown cause, acute encephalopathy suspect metabolic, recent splenic infarct; history end-stage renal disease, hypertension, B-cell lymphoproliferative disorder, type II diabetes mellitus, smoker EXAM: CT HEAD WITHOUT CONTRAST TECHNIQUE: Contiguous axial images were obtained from the base of the skull through the vertex without intravenous contrast. Sagittal and coronal MPR images reconstructed from axial data set. COMPARISON:  None FINDINGS: Brain: Normal ventricular morphology. No midline shift or mass effect. Normal appearance of brain parenchyma. No intracranial hemorrhage, mass lesion, or evidence of acute infarction. No extra-axial fluid collections. Vascular: No hyperdense vessels. Atherosclerotic calcification of internal carotid and vertebral arteries at skull base. Skull: Intact Sinuses/Orbits: Clear Other: N/A IMPRESSION: No acute intracranial abnormalities. Electronically Signed   By: Lavonia Dana M.D.   On: 08/02/2020 16:32   DG Chest Portable 1 View  Result  Date: 08/01/2020 CLINICAL DATA:  Shortness of breath EXAM: PORTABLE CHEST 1 VIEW COMPARISON:  11/02/2019 FINDINGS: Cardiomegaly. Atherosclerotic calcification of the aortic knob. Pulmonary vascular congestion with diffuse bilateral interstitial opacities. More confluent airspace disease within the lung bases, right worse than left. Possible trace right pleural effusion. No pneumothorax. IMPRESSION: Cardiomegaly with bilateral interstitial opacities, likely pulmonary edema. A superimposed infectious process would be difficult to exclude, particularly at the right lung base. Electronically Signed   By: Davina Poke D.O.   On: 08/01/2020 10:50   CT Angio Chest/Abd/Pel for Dissection W and/or W/WO  Result Date: 08/02/2020 CLINICAL DATA:  Chest pain and back pain. Concern for aortic dissection. Pain status post fall 2 days ago with left toe amputation 1 week ago. EXAM: CT ANGIOGRAPHY CHEST, ABDOMEN AND PELVIS TECHNIQUE: Non-contrast CT of the chest was initially obtained. Multidetector CT imaging through the chest, abdomen and pelvis was performed using the standard protocol during bolus administration of intravenous contrast. Multiplanar reconstructed images and MIPs were obtained and reviewed to evaluate the vascular anatomy. CONTRAST:  1108mL OMNIPAQUE IOHEXOL 350 MG/ML SOLN COMPARISON:  CT of the pelvis dated 10/28/2019. FINDINGS: CTA CHEST FINDINGS Cardiovascular: There is no evidence for a thoracic aortic dissection or aneurysm. There are atherosclerotic changes of the thoracic aorta. There is no evidence for an acute pulmonary embolism. The heart size is moderately enlarged. There is a trace pericardial effusion. The arch vessels are patent where visualized. Mediastinum/Nodes: --there is mild mediastinal adenopathy, likely reactive. -- No hilar lymphadenopathy. -- No axillary lymphadenopathy. -- No supraclavicular lymphadenopathy. -- Normal thyroid gland where visualized. -  Unremarkable esophagus.  Lungs/Pleura: There are small bilateral pleural effusions with adjacent atelectasis. There is diffuse bilateral interlobular septal thickening with hazy bilateral ground-glass airspace opacification. There is no definite pneumothorax. Musculoskeletal: No chest wall abnormality. No bony spinal canal stenosis. Review of the MIP images confirms the above findings. CTA ABDOMEN AND PELVIS FINDINGS VASCULAR Aorta: Normal caliber aorta without aneurysm, dissection, vasculitis or significant stenosis. Atherosclerotic changes are noted. Celiac: Patent without evidence of aneurysm, dissection, vasculitis or significant stenosis. SMA: Patent without evidence of aneurysm, dissection, vasculitis or significant stenosis. Renals: Both renal arteries are highly stenotic. IMA: Patent without evidence of aneurysm, dissection, vasculitis or significant stenosis. Inflow: Patent without evidence of aneurysm, dissection, vasculitis or significant stenosis. Veins: No obvious venous abnormality within the limitations of this arterial phase study. Review of the MIP images confirms the above findings. NON-VASCULAR Hepatobiliary: Small hepatic cysts are again noted. Status post  cholecystectomy.There is no biliary ductal dilation. Pancreas: Normal contours without ductal dilatation. No peripancreatic fluid collection. Spleen: The spleen is borderline enlarged. Adrenals/Urinary Tract: --Adrenal glands: Unremarkable. --Right kidney/ureter: The right kidney is atrophic. --Left kidney/ureter: The left kidney is atrophic. There is a growing exophytic nodule rising from the lower pole measuring approximately 25 Hounsfield units and 2.1 cm. The left kidney is atrophic. There is a right pelvic transplant kidney in place with evidence for cortical thinning. There is no hydronephrosis. --Urinary bladder: Unremarkable. Stomach/Bowel: --Stomach/Duodenum: No hiatal hernia or other gastric abnormality. Normal duodenal course and caliber. --Small bowel:  Unremarkable. --Colon: Unremarkable. --Appendix: Normal. Lymphatic: --No retroperitoneal lymphadenopathy. --No mesenteric lymphadenopathy. --there are few mildly enlarged pelvic lymph nodes which are presumably reactive in etiology. Reproductive: Not well evaluated secondary to streak artifact. Other: No ascites or free air. The abdominal wall is normal. Musculoskeletal. The patient is status post total hip arthroplasty on the right. Review of the MIP images confirms the above findings. IMPRESSION: 1. No acute vascular abnormality. 2. Cardiomegaly with small bilateral pleural effusions and findings consistent with developing pulmonary edema. An underlying atypical infectious process is not entirely excluded. 3. Indeterminate growing exophytic nodule rising from the lower pole the left kidney. This is favored to represent a proteinaceous or hemorrhagic cyst, however follow-up with an outpatient renal ultrasound is recommended. 4. Atrophic native kidneys. The right pelvic transplant kidney demonstrates cortical thinning without hydronephrosis. Findings could indicate transplant failure. 5. Additional chronic findings as detailed above. Aortic Atherosclerosis (ICD10-I70.0). Electronically Signed   By: Constance Holster M.D.   On: 08/02/2020 01:01    Cardiac Studies   Echo pending    Echo 08/15/15: - Left ventricle: The cavity size was normal. There was mild  concentric hypertrophy. Systolic function was normal. The  estimated ejection fraction was in the range of 55% to 60%. Wall  motion was normal; there were no regional wall motion  abnormalities. Doppler parameters are consistent with abnormal  left ventricular relaxation (grade 1 diastolic dysfunction).  - Aortic valve: Transvalvular velocity was within the normal range.  There was no stenosis. There was no regurgitation.  - Mitral valve: Transvalvular velocity was within the normal range.  There was no evidence for stenosis. There  was no regurgitation.  - Left atrium: The atrium was severely dilated.  - Right ventricle: The cavity size was normal. Wall thickness was  normal. Systolic function was normal.  - Tricuspid valve: There was no regurgitation.  - Pulmonic valve: Transvalvular velocity was within the normal  range. There was no evidence for stenosis. There was no  regurgitation.   Patient Profile     50 y.o. 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 Complex PMH as outlined in consult note. Recently in the hospital for ischemic foot s/p L TMA. He presented to the hospital 08/01/2020 after having a fall and being found confused by his aide that arrived, also with some recollection of having had chest pain. He had been confirmed compliant with last scheduled HD session on Friday. He was hypertensive and tachycardic (sinus) on arrival. He required BiPAP on arrival and quickly improved with NTG paste and BiPAP. CT angio ruled out dissection. Cardiology  asked to see for elevated troponin.    Assessment & Plan    Acute encephalopathy Acute respiratory distress Pulmonary edema requiring BiPAP - etiology unclear --> patient questions if he took too many narcotics unintentionally for recent amputation - easily aroused today, answers questions, oriented to place and situation   Chest pain NSTEMI - hs troponin 1049 --> 1925 --> 1964 - question demand ischemia  - may consider ischemic workup; however, Hb dropped 3 grams - now stable at 8.3 - no angiography planned at this time, would not be a good  candidate for DAPT - denies chest pain   Sinus tachycardia - telemetry reviewed with EP - continue BB for sinus tachycardia - HR on exam in the 140s - on lopressor 50 mg BID - may need to increase this to 75-100 mg BID - appears asymptomatic   Splenic infarct - on coumadin prior to admission - heparin has been held for anemia - will defer to primary for timing of anticoagulation   ESRD on HD PVD s/p R BKA 07/2019, L TMA 06/2020 Leukocytosis Acute on chronic anemia - per primary team      For questions or updates, please contact [place] Please consult [email or web address] for contact info under        Signed, Ledora Bottcher, PA  08/03/2020, 9:44 AM     Attending Note:   The patient was seen and examined.  Agree with assessment and plan as noted above.  Changes made to the above note as needed.  Patient seen and independently examined with  Doreene Adas, PA .   We discussed all aspects of the encounter. I agree with the assessment and plan as stated above.  1.    NSTEMI:  Elevated  troponins , likely demand ischemia.   Will consider further ischemic work up if he stabilizes.   He dropped his Hg several days ago requiring transfusion.    He is a poor candidate for DAPT currently   2.  Altered mental status:   He continues to have an altered mental status.   "something just not quite normal " Further plans per IM team   3.  Sinus tach :    4. Hypokalemia:  Plans per nephrology     I have spent a total of 40 minutes with patient reviewing hospital  notes , telemetry, EKGs, labs and examining patient as well as establishing an assessment and plan that was discussed with the patient. > 50% of time was spent in direct patient care.    Thayer Headings, Brooke Bonito., MD, [facility] 08/03/2020, 2:38 PM [address],  [place] Pager [phone]

## 2020-08-03 NOTE — Progress Notes (Addendum)
Pharmacy Antibiotic Note  James [name] is a 50 y.o. male with PMD of ESRD on HD, hx of failed kidney transplant, PVD, R BKA and recent left TMA who was admitted on 08/01/2020 with acute encephalopathy and respiratory failure. Pt is febrile, and pharmacy has been consulted for vancomycin dosing for empiric therapy.  WBC 9.1, Tmax 102.35F, pt on MWF HD schedule (had HD earlier today)  Plan: Vancomycin 1250 mg IV X 1, followed by vancomycin 750 mg IV with each HD (MWF) Monitor WBC, temp, clinical improvement, vancomycin levels  Weight: 62.8 kg (138 lb 7.2 oz)  Temp (24hrs), Avg:100.1 F (37.8 C), Min:98.5 F (36.9 C), Max:102.1 F (38.9 C)  Recent Labs  Lab 08/01/20 1020 08/01/20 1026 08/02/20 0628 08/02/20 0657 08/02/20 1559 08/03/20 0249 08/03/20 1248  WBC 20.6*  --   --  9.5 12.7* 10.3 9.1  CREATININE 12.15* 12.20* 14.21*  --   --  8.80* 2.94*    Estimated Creatinine Clearance: 27.6 mL/min (A) (by C-G formula based on SCr of 2.94 mg/dL (H)).    Allergies  Allergen Reactions  . Tramadol Hcl Shortness Of Breath and Other (See Comments)    Tramadol HCl = Shortness of breath and Tramadol = Rash  . Nsaids Other (See Comments)    Was told by MD to not take these  . Darvon [Propoxyphene] Rash and Other (See Comments)    Broke out on eyelid     Antimicrobials this admission: Ceftriaxone 1/10, 1/12 >> Vancomycin 1/12 >>  Microbiology results: 1/10 COVID, flu A, flu B, HIV screen: negative 1/11 HBsAg: negative  Thank you for allowing pharmacy to be a part of this patient's care.  Gillermina Hu, PharmD, BCPS, [facility] Clinical Pharmacist 08/03/2020 4:10 PM

## 2020-08-03 NOTE — Progress Notes (Signed)
   08/03/20 1500  Assess: MEWS Score  Temp (!) 102.1 F (38.9 C)  BP (!) 155/91  ECG Heart Rate (!) 129  Resp (!) 26  Level of Consciousness Alert  SpO2 98 %  O2 Device Room Air  Assess: MEWS Score  MEWS Temp 2  MEWS Systolic 0  MEWS Pulse 2  MEWS RR 2  MEWS LOC 0  MEWS Score 6  MEWS Score Color Red  Assess: if the MEWS score is Yellow or Red  Were vital signs taken at a resting state? Yes  Focused Assessment No change from prior assessment  Early Detection of Sepsis Score *See Row Information* Low  MEWS guidelines implemented *See Row Information* Yes  Treat  MEWS Interventions Administered prn meds/treatments  Pain Scale 0-10  Pain Score 8  Pain Type Surgical pain  Pain Location Foot  Pain Orientation Left  Pain Intervention(s) RN made aware;Repositioned  Multiple Pain Sites No  Take Vital Signs  Increase Vital Sign Frequency  Red: Q 1hr X 4 then Q 4hr X 4, if remains red, continue Q 4hrs  Escalate  MEWS: Escalate Red: discuss with charge nurse/RN and provider, consider discussing with RRT  Notify: Charge Nurse/RN  Name of Charge Nurse/RN Notified Abigail RN  Date Charge Nurse/RN Notified 08/03/20  Time Charge Nurse/RN Notified 0315  Notify: Provider  Provider Name/Title Joni Reining DO  Date Provider Notified 08/03/20  Time Provider Notified 1519  Notification Type  (secure chat)  Notification Reason  (102 temp new)  Response No new orders  Date of Provider Response 08/03/20  Document  Patient Outcome Stabilized after interventions  Progress note created (see row info) Yes   Spoke to internal medicine MD at pt bedside, gave IV Ativan and Tylenol, pt stable at this time, will continue to monitor.

## 2020-08-03 NOTE — Significant Event (Signed)
Rapid Response Event Note   Reason for Call :  SVT, HR 170 during his dialysis treatment  Initial Focused Assessment:  Pt awake, AO lying in bed. He denies shortness of breath, chest pain. He endorses dizziness, later denies. Skin is warm, dry, color is pink.   Per staff heart rate reduced from 170 bpm to 120 bpm with Valsalva maneuver. Pt heart rate sustaining 120-140 bpm upon my assessment.  VS: BP 195/103, HR 120, RR 17, SpO2 98% on room air Pt with known hypertension.  Interventions:  -No intervention from [place] of Care:  Per Nephrology: continue dialysis without additional fluid removal, repeat CBC & RFP, EKG. Dr. Candiss Norse to update primary team.   Call rapid response for additional needs  Event Summary:  MD Notified: Dr. Candiss Norse Call Time: [place] Time: 1130 End Time: [place], RN

## 2020-08-04 DIAGNOSIS — G9340 Encephalopathy, unspecified: Secondary | ICD-10-CM | POA: Diagnosis not present

## 2020-08-04 DIAGNOSIS — I5043 Acute on chronic combined systolic (congestive) and diastolic (congestive) heart failure: Secondary | ICD-10-CM

## 2020-08-04 DIAGNOSIS — D509 Iron deficiency anemia, unspecified: Secondary | ICD-10-CM | POA: Diagnosis not present

## 2020-08-04 DIAGNOSIS — R000 Tachycardia, unspecified: Secondary | ICD-10-CM | POA: Diagnosis not present

## 2020-08-04 DIAGNOSIS — J810 Acute pulmonary edema: Secondary | ICD-10-CM | POA: Diagnosis not present

## 2020-08-04 LAB — COMPREHENSIVE METABOLIC PANEL
ALT: 36 U/L (ref 0–44)
AST: 38 U/L (ref 15–41)
Albumin: 2.3 g/dL — ABNORMAL LOW (ref 3.5–5.0)
Alkaline Phosphatase: 182 U/L — ABNORMAL HIGH (ref 38–126)
Anion gap: 14 (ref 5–15)
BUN: 17 mg/dL (ref 6–20)
CO2: 28 mmol/L (ref 22–32)
Calcium: 9.6 mg/dL (ref 8.9–10.3)
Chloride: 97 mmol/L — ABNORMAL LOW (ref 98–111)
Creatinine, Ser: 5.33 mg/dL — ABNORMAL HIGH (ref 0.61–1.24)
GFR, Estimated: 13 mL/min — ABNORMAL LOW (ref >60)
Glucose, Bld: 86 mg/dL (ref 70–99)
Potassium: 4.2 mmol/L (ref 3.5–5.1)
Sodium: 139 mmol/L (ref 135–145)
Total Bilirubin: 0.7 mg/dL (ref 0.3–1.2)
Total Protein: 7.5 g/dL (ref 6.5–8.1)

## 2020-08-04 LAB — CBC
HCT: 22.7 % — ABNORMAL LOW (ref 39.0–52.0)
Hemoglobin: 7.4 g/dL — ABNORMAL LOW (ref 13.0–17.0)
MCH: 35.2 pg — ABNORMAL HIGH (ref 26.0–34.0)
MCHC: 32.6 g/dL (ref 30.0–36.0)
MCV: 108.1 fL — ABNORMAL HIGH (ref 80.0–100.0)
Platelets: 160 10*3/uL (ref 150–400)
RBC: 2.1 MIL/uL — ABNORMAL LOW (ref 4.22–5.81)
RDW: 15.9 % — ABNORMAL HIGH (ref 11.5–15.5)
WBC: 8.2 10*3/uL (ref 4.0–10.5)
nRBC: 0 % (ref 0.0–0.2)

## 2020-08-04 LAB — PROTIME-INR
INR: 1.5 — ABNORMAL HIGH (ref 0.8–1.2)
Prothrombin Time: 17.5 seconds — ABNORMAL HIGH (ref 11.4–15.2)

## 2020-08-04 LAB — GLUCOSE, CAPILLARY
Glucose-Capillary: 76 mg/dL (ref 70–99)
Glucose-Capillary: 103 mg/dL — ABNORMAL HIGH (ref 70–99)
Glucose-Capillary: 104 mg/dL — ABNORMAL HIGH (ref 70–99)
Glucose-Capillary: 115 mg/dL — ABNORMAL HIGH (ref 70–99)

## 2020-08-04 MED ORDER — PEG-KCL-NACL-NASULF-NA ASC-C 100 G PO SOLR
0.5000 | Freq: Once | ORAL | Status: AC
  Administered 2020-08-04: 100 g via ORAL
  Filled 2020-08-04: qty 1

## 2020-08-04 MED ORDER — BISACODYL 5 MG PO TBEC
20.0000 mg | DELAYED_RELEASE_TABLET | Freq: Once | ORAL | Status: AC
  Administered 2020-08-04: 20 mg via ORAL
  Filled 2020-08-04: qty 4

## 2020-08-04 MED ORDER — PEG-KCL-NACL-NASULF-NA ASC-C 100 G PO SOLR: 1.0000 | Freq: Once | ORAL | Status: DC

## 2020-08-04 MED ORDER — PEG-KCL-NACL-NASULF-NA ASC-C 100 G PO SOLR
0.5000 | Freq: Once | ORAL | Status: AC
  Administered 2020-08-04: 100 g via ORAL

## 2020-08-04 NOTE — Progress Notes (Signed)
[facility] Progress Note    Assessment/ Plan:   ESRD:  -h/o failed txp -outpatient orders [facility]: F200, 3.5HRS, 350/700. 2K/2.5Ca. Heparin 1k units bolus -maintain MWF sched here -no heparin here -continue with home pred and tacrolimus  NSTEMI -appreciate cardiology -ischemic workup on hold given possible infection and blood loss anemia  New CHF -ef now 35-40%, cardiology on board. UF as tolerated  Tachyarrhythmia -rapid response called in dialysis unit 1/12, held further UF, improved with valsalva. Looked like SVT initially on tele w/ HR up to the 180s, sinus tach on ekg. Did  -needs to get BB prior to HD  Volume/ hypertension/pulmonary edema: -edw 69kg, UF as tolerated. Resume home anti-htns. Resp status better  Anemia secondary to Chronic Kidney Disease and blood loss: -aranesp 20mcg every Wed -venofer once every 2 weeks -1u prbc on 1/11 -stool occult positive, GI on board, cscope and egd during admit -macrocytic, b12 high, folate ok -transfuse prn for hgb <7  Secondary Hyperparathyroidism/Hyperphosphatemia: resume home binders (on renvela), monitor phos and PTH  -hectorol 4.9mcg qtreatment  AMS/encephalopathy -persistent despite HD, possibly another etiology rather than uremic encephalopathy. Mgmt per primary service  Vascular access: rue avf +b/t  # Additional recommendations: - Dose all meds for creatinine clearance <10 ml/min  - Unless absolutely necessary, no MRIs with gadolinium.  - Implement save arm precautions. Prefer needle sticks in the dorsum of the hands or wrists. No blood pressure measurements in arm. - If blood transfusion is requested during hemodialysis sessions, please alert [place] prior to the session.  - If a hemodialysis catheter line culture is requested, please alert [place] as only hemodialysis nurses are able to collect those specimens.   Gean Quint, MD [facility]  Subjective:   Seen  and examined earlier this AM. No acute events. Slightly confused in the am. HR stable.   Objective:   BP 140/77 (BP Location: Left Arm)   Pulse (!) 103   Temp 97.8 F (36.6 C) (Oral)   Resp 16   Wt 63.7 kg   SpO2 100%   BMI 21.99 kg/m   Intake/Output Summary (Last 24 hours) at 08/04/2020 1241 Last data filed at 08/04/2020 0800 Gross per 24 hour  Intake 240 ml  Output 1195 ml  Net -955 ml   Weight change:   Physical Exam: Gen:nad CVS:reg rate Resp: unlabored, no iwob FWY:OVZC, nt/nd Ext:no edema Neuro: speech clear and coherent, moves all ext spontaneously, intermittently confused Access: rue avf +b/t  Imaging: CT HEAD WO CONTRAST  Result Date: 08/02/2020 CLINICAL DATA:  Mental status changes of unknown cause, acute encephalopathy suspect metabolic, recent splenic infarct; history end-stage renal disease, hypertension, B-cell lymphoproliferative disorder, type II diabetes mellitus, smoker EXAM: CT HEAD WITHOUT CONTRAST TECHNIQUE: Contiguous axial images were obtained from the base of the skull through the vertex without intravenous contrast. Sagittal and coronal MPR images reconstructed from axial data set. COMPARISON:  None FINDINGS: Brain: Normal ventricular morphology. No midline shift or mass effect. Normal appearance of brain parenchyma. No intracranial hemorrhage, mass lesion, or evidence of acute infarction. No extra-axial fluid collections. Vascular: No hyperdense vessels. Atherosclerotic calcification of internal carotid and vertebral arteries at skull base. Skull: Intact Sinuses/Orbits: Clear Other: N/A IMPRESSION: No acute intracranial abnormalities. Electronically Signed   By: Lavonia Dana M.D.   On: 08/02/2020 16:32   ECHOCARDIOGRAM LIMITED  Result Date: 08/03/2020    ECHOCARDIOGRAM LIMITED REPORT   Patient Name:   James [name] Date of Exam: 08/03/2020  Medical Rec #:  [id number]       Height:       67.0 in Accession #:    [id number]      Weight:       147.9 lb Date of  Birth:  March 06, 1971       BSA:          1.779 m Patient Age:    50 years        BP:           155/91 mmHg Patient Gender: M               HR:           129 bpm. Exam Location:  Inpatient Procedure: Limited Echo and Limited Color Doppler Indications:    Chest Pain R07.9  History:        Patient has prior history of Echocardiogram examinations, most                 recent 08/15/2015. PAD; Risk Factors:Hypertension, Current Smoker                 and Diabetes. GERD. End stage renal disease. Anemia. Prolonged                 QT Interval. History of cancer. Thrombocytopenia.  Sonographer:    Darlina Sicilian RDCS Referring Phys: [id number] Comments: Image acquisition challenging due to uncooperative patient. IMPRESSIONS  1. EF estimate limited as only para sternal images obtained no apical or sub costal images done due to patient pain and lack of cooperation . Left ventricular ejection fraction, by estimation, is 35 to 40%. The left ventricle has moderately decreased function. The left ventricle demonstrates global hypokinesis. The left ventricular internal cavity size was moderately dilated. There is moderate left ventricular hypertrophy.  2. Left atrial size was moderately dilated.  3. The mitral valve is abnormal. Mild mitral valve regurgitation. Moderate mitral annular calcification.  4. Calcified non coronary cusp. The aortic valve was not well visualized. Mild to moderate aortic valve sclerosis/calcification is present, without any evidence of aortic stenosis. FINDINGS  Left Ventricle: EF estimate limited as only para sternal images obtained no apical or sub costal images done due to patient pain and lack of cooperation. Left ventricular ejection fraction, by estimation, is 35 to 40%. The left ventricle has moderately decreased function. The left ventricle demonstrates global hypokinesis. The left ventricular internal cavity size was moderately dilated. There is moderate left ventricular  hypertrophy. Left Atrium: Left atrial size was moderately dilated. Mitral Valve: The mitral valve is abnormal. There is mild thickening of the mitral valve leaflet(s). There is mild calcification of the mitral valve leaflet(s). Moderate mitral annular calcification. Mild mitral valve regurgitation. Aortic Valve: Calcified non coronary cusp. The aortic valve was not well visualized. Mild to moderate aortic valve sclerosis/calcification is present, without any evidence of aortic stenosis. LEFT VENTRICLE PLAX 2D LVIDd:         5.00 cm LVIDs:         4.10 cm LV PW:         1.30 cm LV IVS:        1.50 cm  LEFT ATRIUM         Index LA diam:    4.30 cm 2.42 cm/m Jenkins Rouge MD Electronically signed by Jenkins Rouge MD Signature Date/Time: 08/03/2020/4:01:32 PM    Final     Labs: BMET Recent Labs  Lab 08/01/20 1020  08/01/20 1026 08/02/20 0628 08/03/20 0249 08/03/20 1248 08/04/20 0249  NA 134* 136  135 135 135 136 139  K 6.0* 5.8*  5.9* 5.3* 4.2 2.9* 4.2  CL 95* 100 95* 92* 95* 97*  CO2 18*  --  20* 25 27 28   GLUCOSE 82 87 81 85 87 86  BUN 69* 66* 82* 41* 10 17  CREATININE 12.15* 12.20* 14.21* 8.80* 2.94* 5.33*  CALCIUM 9.8  --  9.4 9.5 9.2 9.6  PHOS  --   --  5.7* 4.5 1.9*  --    CBC Recent Labs  Lab 08/02/20 1559 08/03/20 0249 08/03/20 1248 08/04/20 0249  WBC 12.7* 10.3 9.1 8.2  HGB 8.5* 8.3* 7.8* 7.4*  HCT 26.6* 24.7* 23.9* 22.7*  MCV 107.3* 104.7* 106.2* 108.1*  PLT 214 205 178 160    Medications:    . sodium chloride   Intravenous Once  . amLODipine  10 mg Oral Daily  . Chlorhexidine Gluconate Cloth  6 each Topical Q0600  . insulin aspart  0-6 Units Subcutaneous TID WC  . lidocaine  1 patch Transdermal Q24H  . metoprolol tartrate  50 mg Oral Q6H  . predniSONE  5 mg Oral Q breakfast  . tacrolimus  0.5 mg Oral Daily      Gean Quint, MD [facility] 08/04/2020, 12:41 PM

## 2020-08-04 NOTE — H&P (View-Only) (Signed)
                                                                           [facility] Consult: 10:07 AM 08/04/2020  LOS: 3 days    Referring Provider: Dr Hoffman.   Primary Care Physician:  Knudson, Mark, MD Primary Gastroenterologist: unassigned.  [facility].  Also seen by PA-C Hilliard and Dr Warren in [place].      Reason for Consultation: Anemia.  FOBT positive   HPI: James [name] is a 50 y.o. male.  PMH ESRD.  Renal transplant 2009, failed 2018 at which point he was started on peripheral dialysis and eventually hemodialysis..  C. difficile 10/2019.  DM 2.  Encephalopathy.  Peripheral vascular disease.  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  12/ 30/2021 left transmetatarsal amputation.  Lymphoproliferative disorder, B-cell lymphoma.  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  Anemia requiring blood transfusions (2 in 10/2019, 1 in 03/2019 per [facility] epic record), recieves Mircera at HD.  Prostate cancer, 04/2019.  03/2019 colonoscopy.  For unexplained diarrhea and IDA.  Dr. Magod.  Congested, erythematous sigmoid mucosa, biopsied.  Small cecal polyp snared.  Otherwise normal study into the terminal ileum. Random biopsies obtained.    Pathology on polyp:  tubular adenoma with no HGD.  Random sigmoid and splenic flexure biopsies revealed tubular adenomas.  No microscopic colitis, no dysplasia, no infectious components 05/2020 colonoscopy was canceled when he forgot to inform the physicians that he was taking warfarin and he failed to complete his bowel prep. Patient has a colonoscopy February 1 with Dr. Gaspari [facility].   12/26 -07/28/2020 admission at [place] [facility] with necrotic, gangrenous toes from his peripheral vascular disease it was during this admission he underwent the left transmetatarsal amputation.  Also addressed was  hypertensive urgency.  Was discharged to home under the care of sister and mother, declined inpatient rehab. Outpatient meds include 81 ASA, daily PO vitamin B12, daily folic acid, omeprazole 20 mg/day, prednisone 5 mg/day, warfarin.  Patient found confused at home and transported to [place].  There is suspicion that he was taking more than prescribed narcotics.  Complaint of left chest pain.  Placed on BiPAP.  Acute encephalopathy is clearing.  Prolonged QT on EKG.  Cardiology suspect non-STEMI.  Empiric antibiotic for leukocytosis of unclear cause.  GI review of systems is also negative.  The only positive is recent anorexia and recent constipation in the setting of narcotics for control of pain in the amputated foot.  Denies nausea, vomiting, abdominal pain, black or bloody stools, dysphagia, pyrosis, NSAID use.  Hgb 8.8  >> 9.9 >> 6.7 >> 1 PRBC >>  7.4.  MCV 109.  Platelets normal.  INR 2. TSH WNL.   Low iron, low TIBC, normal folate.  Markedly elevated B12 and ferritin level.  T bili 0.7.  Alk phos 182.  AST/ALT 38/36 Troponins: 1049, 1925, 1964. 2D echo with LVEF 35 to 40%.  Mild mitral valve regurg.  Patient is adopted, family history unknown. Social history.  Does not drink does not use illicit drugs.  Smokes about 5 cigarettes/day.    Past Medical History:  Diagnosis Date  .   Anemia   . Avascular necrosis (HCC)   . BPH (benign prostatic hyperplasia)   . Complication of anesthesia    slow to wake up  . ESRD (end stage renal disease) (HCC)    failed kidney transplant  . GERD (gastroesophageal reflux disease)   . Glaucoma   . History of blood transfusion   . Hypertension   . Hypertriglyceridemia   . Influenza A   . 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 Peripheral vascular disease (HCC)     s/p prior R BKA 07/2019 and recent L TMA 06/2020  . Pneumonia 04/21/2017   several times per pt on 04/22/20  .  Prolonged QT interval   . Prostate cancer (HCC)    prostate cancer, s/p prostatectomy 04/24/19  . Renal disorder    dialysis m w f  . Renal transplant recipient / ESRD    ESRD due to HTN > started HD in 2003, got HD in  until about 2006-07 when he was discharged from [facility] practice. He then got HD in [place] until getting a renal transplant in 2009 at [facility], DD[facility].  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    . Smoker    tx wellbutriin  . 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 Tachycardia   . Thrombocytopenia (HCC)   . Transplant recipient   . Type 2 diabetes mellitus (HCC)    type 2 - pt states no meds, diet controlled as of 04/22/20    Past Surgical History:  Procedure Laterality Date  . AV FISTULA PLACEMENT Right 11/02/2019   Procedure: ARTERIOVENOUS (AV) FISTULA CREATION RIGHT UPPER ARM;  Surgeon: Fields, Elver E, MD;  Location: [facility] OR;  Service: Vascular;  Laterality: Right;  . AV FISTULA PLACEMENT Right 04/25/2020   Procedure: INSERTION OF ARTERIOVENOUS (AV) GORE-TEX GRAFT ARM;  Surgeon: Fields, Javonne E, MD;  Location: [facility];  Service: Vascular;  Laterality: Right;  . BASCILIC VEIN TRANSPOSITION Right 11/02/2019   Procedure: Right Cephalic Vein Transposition;  Surgeon: Fields, Melroy E, MD;  Location: [facility];  Service: Vascular;  Laterality: Right;  . BIOPSY  03/31/2019   Procedure: BIOPSY;  Surgeon: Magod, Marc, MD;  Location: [facility];  Service: Endoscopy;;  . CAPD REMOVAL Left 11/04/2019   Procedure: CONTINUOUS AMBULATORY PERITONEAL DIALYSIS  (CAPD) CATHETER REMOVAL;  Surgeon: Ramirez, Armando, MD;  Location: [facility];  Service: General;  Laterality: Left;  . CHOLECYSTECTOMY    . COLONOSCOPY WITH PROPOFOL N/A 03/31/2019   Procedure: COLONOSCOPY WITH PROPOFOL;  Surgeon: Magod, Marc, MD;  [facility]: [facility];  Service: Endoscopy;  Laterality:  N/A;  . HIP SURGERY    . INSERTION OF DIALYSIS CATHETER Left 04/24/2017   Procedure: EXCHANGE  OF DIALYSIS CATHETER;  Surgeon: Early, Todd F, MD;  Location: [facility];  Service: Vascular;  Laterality: Left;  . INSERTION OF DIALYSIS CATHETER Left 11/02/2019   Procedure: INSERTION OF TUNNELED DIALYSIS CATHETER LEFT INTERNAL JUGULAR;  Surgeon: Fields, Parke E, MD;  Location: [facility];  Service: Vascular;  Laterality: Left;  . JOINT REPLACEMENT Right    hip  . KIDNEY TRANSPLANT Right   . PERITONEAL CATHETER INSERTION Left 08/2017  . POLYPECTOMY  03/31/2019   Procedure: POLYPECTOMY;  Surgeon: Magod, Marc, MD;  Location: [facility];  Service: Endoscopy;;  .   VENOGRAM N/A 11/02/2019   Procedure: Central Venogram;  Surgeon: Fields, Koron E, MD;  Location: [facility];  Service: Vascular;  Laterality: N/A;    Prior to Admission medications   Medication Sig Start Date End Date Taking? Authorizing Provider  acetaminophen (TYLENOL) 500 MG tablet Take 500-1,000 mg by mouth every 6 (six) hours as needed (pain or headaches).    Yes [provider]  amLODipine (NORVASC) 10 MG tablet Take 1 tablet (10 mg total) by mouth daily. 11/04/19 04/14/21 Yes Ezenduka, Nkeiruka J, MD  aspirin EC 81 MG tablet Take 81 mg by mouth daily. Swallow whole.   Yes [provider]  diphenhydrAMINE (BENADRYL) 25 mg capsule Take 25 mg by mouth every 6 (six) hours as needed for itching.    Yes [provider]  warfarin (COUMADIN) 5 MG tablet Take 5-7.5 mg by mouth See admin instructions. Take 1.5 tablets (7.5 mg) by mouth in the evenings on Mondays, Wednesdays, & Fridays. Take 1 tablet (5 mg) by mouth in the evening on Sundays, Tuesdays, Thursdays, & Saturdays. 10/11/19  Yes [provider]  buPROPion (WELLBUTRIN XL) 150 MG 24 hr tablet Take 150 mg by mouth daily.    [provider]  Cyanocobalamin (B-12) 500 MCG TABS Take 500 mcg by mouth daily.  08/12/19   [provider]  doxazosin (CARDURA)  2 MG tablet Take 2 mg by mouth at bedtime.    [provider]  ferric citrate (AURYXIA) 1 GM 210 MG(Fe) tablet Take 210-420 mg by mouth See admin instructions. Take 2 tablets (420 mg) by mouth up to three times daily with meals and 1 tablet (210 mg) with snacks    [provider]  gabapentin (NEURONTIN) 100 MG capsule Take 100 mg by mouth at bedtime. 03/11/20   [provider]  insulin aspart (NOVOLOG FLEXPEN) 100 UNIT/ML FlexPen Inject 10 Units into the skin 3 (three) times daily with meals. Patient taking differently: Inject 2 Units into the skin 3 (three) times daily as needed for high blood sugar (CBG >180). 04/25/17   Nettey, Ralph A, MD  Insulin Detemir (LEVEMIR FLEXTOUCH) 100 UNIT/ML Pen Inject 15 Units into the skin at bedtime. Patient not taking: Reported on 08/01/2020 09/23/17   Regalado, Belkys A, MD  latanoprost (XALATAN) 0.005 % ophthalmic solution Place 1 drop into both eyes at bedtime.     [provider]  lisinopril (ZESTRIL) 10 MG tablet Take 10 mg by mouth at bedtime. 03/11/20   [provider]  loperamide (IMODIUM) 2 MG capsule Take 2 mg by mouth as needed for diarrhea or loose stools.     [provider]  Melatonin 5 MG CAPS Take 5 mg by mouth at bedtime as needed (sleep).    [provider]  metoprolol tartrate (LOPRESSOR) 100 MG tablet Take 100 mg by mouth 2 (two) times daily.  10/13/19   [provider]  omeprazole (PRILOSEC) 20 MG capsule Take 20 mg by mouth daily.     [provider]  oxyCODONE (OXY IR/ROXICODONE) 5 MG immediate release tablet Take 5 mg by mouth every 6 (six) hours as needed (pain). 07/28/20   [provider]  oxyCODONE-acetaminophen (PERCOCET) 5-325 MG tablet Take 1 tablet by mouth every 6 (six) hours as needed. 04/25/20   Rhyne, Samantha J, PA-C  predniSONE (DELTASONE) 5 MG tablet Take 5 mg by mouth daily with breakfast.     [provider]  tacrolimus (PROGRAF) 0.5 MG  capsule Take 0.5 mg by   mouth daily.  Patient not taking: Reported on 08/02/2020    [provider]  triamcinolone cream (KENALOG) 0.1 % Apply 1 application topically daily. (dermatitis) After bathing    [provider]  valGANciclovir (VALCYTE) 450 MG tablet Take 450 mg by mouth every Monday, Wednesday, and Friday with hemodialysis.  08/22/17   [provider]  zolpidem (AMBIEN) 10 MG tablet Take 10 mg by mouth at bedtime as needed for sleep.     [provider]    Scheduled Meds: . sodium chloride   Intravenous Once  . amLODipine  10 mg Oral Daily  . Chlorhexidine Gluconate Cloth  6 each Topical Q0600  . insulin aspart  0-6 Units Subcutaneous TID WC  . lidocaine  1 patch Transdermal Q24H  . metoprolol tartrate  50 mg Oral Q6H  . predniSONE  5 mg Oral Q breakfast  . tacrolimus  0.5 mg Oral Daily   Infusions: . cefTRIAXone (ROCEPHIN)  IV 2 g (08/03/20 2046)  . [START ON 08/05/2020] vancomycin     PRN Meds: acetaminophen **OR** acetaminophen, oxyCODONE, polyethylene glycol   Allergies as of 08/01/2020 - Review Complete 08/01/2020  Allergen Reaction Noted  . Tramadol hcl Shortness Of Breath and Other (See Comments) 08/05/2009  . Nsaids Other (See Comments) 03/27/2019  . Darvon [propoxyphene] Rash and Other (See Comments) 04/09/2014    Family History  Adopted: Yes    Social History   Socioeconomic History  . Marital status: Single    Spouse name: Not on file  . Number of children: Not on file  . Years of education: Not on file  . Highest education level: Not on file  Occupational History  . Not on file  Tobacco Use  . Smoking status: Current Every Day Smoker    Packs/day: 0.25    Years: 26.00    Pack years: 6.50    Types: Cigarettes  . Smokeless tobacco: Never Used  Vaping Use  . Vaping Use: Never used  Substance and Sexual Activity  . Alcohol use: No  . Drug use: No  . Sexual activity: Not on file  Other Topics Concern  . Not  on file  Social History Narrative  . Not on file   Social Determinants of Health   Financial Resource Strain: Not on file  Food Insecurity: Not on file  Transportation Needs: Not on file  Physical Activity: Not on file  Stress: Not on file  Social Connections: Not on file  Intimate Partner Violence: Not on file    REVIEW OF SYSTEMS: Constitutional: Some weakness.  No profound fatigue ENT:  No nose bleeds Pulm: At most dry cough.  No dyspnea. CV:  No palpitations, no LE edema.  Chest pain resolved GU:  No hematuria, no frequency GI: See HPI Heme: [place] excessive or unusual bleeding or bruising.  Sees Mircera and Hectorol at dialysis Transfusions: Recalls having had transfusions in the past but vague on details as to how recently and how often. Neuro:  No headaches, no peripheral tingling or numbness Derm:  No itching, no rash or sores.  Endocrine:  No sweats or chills.  No polyuria or dysuria Immunization: Not queried Travel:  None beyond local counties in last few months.    PHYSICAL EXAM: Vital signs in last 24 hours: Vitals:   08/04/20 0828 08/04/20 0829  BP: (!) 145/80   Pulse:    Resp:    Temp:  98.4 F (36.9 C)  SpO2:     Wt Readings from   Last 3 Encounters:  08/04/20 63.7 kg  04/25/20 67.1 kg  03/10/20 68 kg    General: Looks chronically ill.  Alert. Head: No facial asymmetry or swelling.  No signs of head trauma. Eyes: No conjunctival pallor or scleral icterus.  EOMI Ears: No hearing deficit Nose: No congestion or discharge Mouth: Tongue midline.  Mucosa pink, moist, clear. Neck: No JVD, masses, thyromegaly Lungs: Clear bilaterally.  No labored breathing.  No cough Heart: RRR.  No MRG.  S1, S2 Abdomen: Soft with active bowel sounds.  Not tender or distended.  No HSM, masses, bruits, hernia.   Rectal: Deferred Musc/Skeltl: Right BKA, site well-healed.  Left transmetatarsal amputation with intact incision line.  This is somewhat tender to the touch and  swollen but not erythematous Extremities: No CCE. Neurologic: Oriented x3.  Has trouble recalling details however.  Is all 4 limbs without tremor.  Strength not tested Skin: No suspicious sores, rashes or lesions Nodes: No cervical LAD Psych: Somewhat anxious but cooperative pleasant with fluid speech.  Intake/Output from previous day: 01/12 0701 - 01/13 0700 In: -  Out: 1195 [Urine:100] Intake/Output this shift: Total I/O In: 240 [P.O.:240] Out: -   LAB RESULTS: Recent Labs    08/03/20 0249 08/03/20 1248 08/04/20 0249  WBC 10.3 9.1 8.2  HGB 8.3* 7.8* 7.4*  HCT 24.7* 23.9* 22.7*  PLT 205 178 160   BMET Lab Results  Component Value Date   NA 139 08/04/2020   NA 136 08/03/2020   NA 135 08/03/2020   K 4.2 08/04/2020   K 2.9 (L) 08/03/2020   K 4.2 08/03/2020   CL 97 (L) 08/04/2020   CL 95 (L) 08/03/2020   CL 92 (L) 08/03/2020   CO2 28 08/04/2020   CO2 27 08/03/2020   CO2 25 08/03/2020   GLUCOSE 86 08/04/2020   GLUCOSE 87 08/03/2020   GLUCOSE 85 08/03/2020   BUN 17 08/04/2020   BUN 10 08/03/2020   BUN 41 (H) 08/03/2020   CREATININE 5.33 (H) 08/04/2020   CREATININE 2.94 (H) 08/03/2020   CREATININE 8.80 (H) 08/03/2020   CALCIUM 9.6 08/04/2020   CALCIUM 9.2 08/03/2020   CALCIUM 9.5 08/03/2020   LFT Recent Labs    08/02/20 0628 08/03/20 1248 08/04/20 0249  PROT 6.6  --  7.5  ALBUMIN 2.4*  2.4* 2.5* 2.3*  AST 38  --  38  ALT 35  --  36  ALKPHOS 209*  --  182*  BILITOT 0.7  --  0.7  BILIDIR 0.3*  --   --   IBILI 0.4  --   --    PT/INR Lab Results  Component Value Date   INR 2.0 (H) 08/02/2020   INR 2.1 (H) 08/01/2020   INR 1.1 04/25/2020   Hepatitis Panel Recent Labs    08/02/20 0630  HEPBSAG NON REACTIVE   C-Diff No components found for: CDIFF Lipase     Component Value Date/Time   LIPASE 24 10/28/2019 1024    Drugs of Abuse  No results found for: LABOPIA, COCAINSCRNUR, LABBENZ, AMPHETMU, THCU, LABBARB   RADIOLOGY STUDIES: CT HEAD  WO CONTRAST  Result Date: 08/02/2020 CLINICAL DATA:  Mental status changes of unknown cause, acute encephalopathy suspect metabolic, recent splenic infarct; history end-stage renal disease, hypertension, B-cell lymphoproliferative disorder, type II diabetes mellitus, smoker EXAM: CT HEAD WITHOUT CONTRAST TECHNIQUE: Contiguous axial images were obtained from the base of the skull through the vertex without intravenous contrast. Sagittal and coronal MPR images reconstructed from   axial data set. COMPARISON:  None FINDINGS: Brain: Normal ventricular morphology. No midline shift or mass effect. Normal appearance of brain parenchyma. No intracranial hemorrhage, mass lesion, or evidence of acute infarction. No extra-axial fluid collections. Vascular: No hyperdense vessels. Atherosclerotic calcification of internal carotid and vertebral arteries at skull base. Skull: Intact Sinuses/Orbits: Clear Other: N/A IMPRESSION: No acute intracranial abnormalities. Electronically Signed   By: Mark  Boles M.D.   On: 08/02/2020 16:32   ECHOCARDIOGRAM LIMITED  Result Date: 08/03/2020    ECHOCARDIOGRAM LIMITED REPORT   Patient Name:   James [name] Date of Exam: 08/03/2020 Medical Rec #:  [id number]       Height:       67.0 in Accession #:    [id number]      Weight:       147.9 lb Date of Birth:  12/13/1970       BSA:          1.779 m Patient Age:    49 years        BP:           155/91 mmHg Patient Gender: M               HR:           129 bpm. Exam Location:  Inpatient Procedure: Limited Echo and Limited Color Doppler Indications:    Chest Pain R07.9  History:        Patient has prior history of Echocardiogram examinations, most                 recent 08/15/2015. PAD; Risk Factors:Hypertension, Current Smoker                 and Diabetes. GERD. End stage renal disease. Anemia. Prolonged                 QT Interval. History of cancer. Thrombocytopenia.  Sonographer:    Tiffany Cooper RDCS Referring Phys: [id number] DAYNA N DUNN   Sonographer Comments: Image acquisition challenging due to uncooperative patient. IMPRESSIONS  1. EF estimate limited as only para sternal images obtained no apical or sub costal images done due to patient pain and lack of cooperation . Left ventricular ejection fraction, by estimation, is 35 to 40%. The left ventricle has moderately decreased function. The left ventricle demonstrates global hypokinesis. The left ventricular internal cavity size was moderately dilated. There is moderate left ventricular hypertrophy.  2. Left atrial size was moderately dilated.  3. The mitral valve is abnormal. Mild mitral valve regurgitation. Moderate mitral annular calcification.  4. Calcified non coronary cusp. The aortic valve was not well visualized. Mild to moderate aortic valve sclerosis/calcification is present, without any evidence of aortic stenosis. FINDINGS  Left Ventricle: EF estimate limited as only para sternal images obtained no apical or sub costal images done due to patient pain and lack of cooperation. Left ventricular ejection fraction, by estimation, is 35 to 40%. The left ventricle has moderately decreased function. The left ventricle demonstrates global hypokinesis. The left ventricular internal cavity size was moderately dilated. There is moderate left ventricular hypertrophy. Left [facility]: Left atrial size was moderately dilated. Mitral Valve: The mitral valve is abnormal. There is mild thickening of the mitral valve leaflet(s). There is mild calcification of the mitral valve leaflet(s). Moderate mitral annular calcification. Mild mitral valve regurgitation. Aortic Valve: Calcified non coronary cusp. The aortic valve was not well visualized. Mild to moderate aortic valve sclerosis/calcification is present, without any evidence   of aortic stenosis. LEFT VENTRICLE PLAX 2D LVIDd:         5.00 cm LVIDs:         4.10 cm LV PW:         1.30 cm LV IVS:        1.50 cm  LEFT ATRIUM         Index LA diam:    4.30 cm 2.42  cm/m Jenkins Rouge MD Electronically signed by Jenkins Rouge MD Signature Date/Time: 08/03/2020/4:01:32 PM    Final      IMPRESSION:   *   Acute on chronic anemia, responded well to 1 PRBC.  FOBT positive but no history melena, bloody emesis or stools. Macrocytic.  Low iron, low TIBC, normal folate.  Markedly elevated B12 and ferritin level.   [facility]infield Rast at HD. Previous colonoscopy in 03/2019 with tubular adenomatous polyps and no source for diarrhea.  No previous EGD. Has colonoscopy set for 2/1 w Dr Lynita Lombard in [place].   Hx B cell lymphoma.    *   ESRD, on hemodialysis MWF.  Renal transplant failed after about 10 years. On prograf, low dose Prednisone,   *    Chronic Coumadin.  History of splenic infarct, peripheral vascular disease. On hold.  Latest INR 2.    *  Leukocytosis.  On empiric Rocephin, Vanc.  ? Could this be post amputation foot infection. clx's negative to date.      *   Prostate cancer.  Prostatectomy 04/2019  *   Hx thrombocytopenia in setting splenic infarct, platelets normal currently.      PLAN:     *   Per Dr Ardis Hughs. ? Pursue EGD since has never had one of these vs defer back to Dr Lynita Lombard, Dan Maker MD?.     Azucena Freed  08/04/2020, 10:07 AM Phone [phone]   ________________________________________________________________________  Velora Heckler GI MD note:  I personally examined the patient, reviewed the data and agree with the assessment and plan described above.  50 yo man with multiple serious comorbid conditions as above. He's had no overt GI bleeding however his Hb since admission for MS changes has continued to drift. He responded to a single unit of blood two days ago. Undoubtedly his anemia is multifactorial (chronic disease, renal failure, on coumadin chronically.  Stools are heme +. He was going to have a colonoscopy in [place] in 2-3 weeks for h/o polyps, chronic anemia.  Cardiology is considering workup for ischemic heart disease and  I think it is reasonable to proceed with colonoscopy and EGD while he is in the hospital. Las INR was 2.0 (two days ago), I'm rechecking it now to see if we can go ahead with the GI testing tomorrow.   Owens Loffler, MD [facility] Gastroenterology Pager [phone]

## 2020-08-04 NOTE — Progress Notes (Addendum)
Progress Note  Patient Name: James [name] Date of Encounter: 08/04/2020  [facility] Cardiologist: Mertie Moores, MD   Subjective   Pt more lucid this morning. No chest pain. Chronic pain with any positioning in bed.  Inpatient Medications    Scheduled Meds: . sodium chloride   Intravenous Once  . amLODipine  10 mg Oral Daily  . Chlorhexidine Gluconate Cloth  6 each Topical Q0600  . insulin aspart  0-6 Units Subcutaneous TID WC  . lidocaine  1 patch Transdermal Q24H  . metoprolol tartrate  50 mg Oral Q6H  . predniSONE  5 mg Oral Q breakfast  . tacrolimus  0.5 mg Oral Daily   Continuous Infusions: . cefTRIAXone (ROCEPHIN)  IV 2 g (08/03/20 2046)  . [START ON 08/05/2020] vancomycin     PRN Meds: acetaminophen **OR** acetaminophen, oxyCODONE, polyethylene glycol   Vital Signs    Vitals:   08/04/20 0434 08/04/20 0754 08/04/20 0828 08/04/20 0829  BP: 137/76 (!) 145/83 (!) 145/80   Pulse: 99 93    Resp: 17 16    Temp: 98.8 F (37.1 C) 98.4 F (36.9 C)  98.4 F (36.9 C)  TempSrc: Oral Oral    SpO2: 96% 96%    Weight: 63.7 kg       Intake/Output Summary (Last 24 hours) at 08/04/2020 8527 Last data filed at 08/04/2020 0800 Gross per 24 hour  Intake 240 ml  Output 1195 ml  Net -955 ml   Last 3 Weights 08/04/2020 08/03/2020 08/02/2020  Weight (lbs) 140 lb 6.9 oz 138 lb 7.2 oz (No Data)  Weight (kg) 63.7 kg 62.8 kg (No Data)      Telemetry    Sinus with HR 90s - Personally Reviewed  ECG    Sinus rhythm HR 100 with PACs, ST-T wave changes with LVH - Personally Reviewed  Physical Exam   GEN: appears older than stated age, no acute distress Neck: No JVD Cardiac: RRR, no murmurs, rubs, or gallops.  Respiratory: Clear to auscultation bilaterally. GI: Soft, nontender, non-distended  MS: No edema; No deformity. Neuro:  Nonfocal, answers questions today, alert, on the phone when I entered the room Psych: Normal affect   Labs    High Sensitivity Troponin:    Recent Labs  Lab 08/01/20 1020 08/01/20 1555 08/01/20 1828  TROPONINIHS 1,049* 1,925* 1,964*      Chemistry Recent Labs  Lab 08/02/20 0628 08/03/20 0249 08/03/20 1248 08/04/20 0249  NA 135 135 136 139  K 5.3* 4.2 2.9* 4.2  CL 95* 92* 95* 97*  CO2 20* 25 27 28   GLUCOSE 81 85 87 86  BUN 82* 41* 10 17  CREATININE 14.21* 8.80* 2.94* 5.33*  CALCIUM 9.4 9.5 9.2 9.6  PROT 6.6  --   --  7.5  ALBUMIN 2.4*  2.4*  --  2.5* 2.3*  AST 38  --   --  38  ALT 35  --   --  36  ALKPHOS 209*  --   --  182*  BILITOT 0.7  --   --  0.7  GFRNONAA 4* 7* 26* 13*  ANIONGAP 20* 18* 14 14     Hematology Recent Labs  Lab 08/03/20 0249 08/03/20 1248 08/04/20 0249  WBC 10.3 9.1 8.2  RBC 2.36* 2.25* 2.10*  HGB 8.3* 7.8* 7.4*  HCT 24.7* 23.9* 22.7*  MCV 104.7* 106.2* 108.1*  MCH 35.2* 34.7* 35.2*  MCHC 33.6 32.6 32.6  RDW 16.4* 16.0* 15.9*  PLT 205 178  160    BNPNo results for input(s): BNP, PROBNP in the last 168 hours.   DDimer No results for input(s): DDIMER in the last 168 hours.   Radiology    CT HEAD WO CONTRAST  Result Date: 08/02/2020 CLINICAL DATA:  Mental status changes of unknown cause, acute encephalopathy suspect metabolic, recent splenic infarct; history end-stage renal disease, hypertension, B-cell lymphoproliferative disorder, type II diabetes mellitus, smoker EXAM: CT HEAD WITHOUT CONTRAST TECHNIQUE: Contiguous axial images were obtained from the base of the skull through the vertex without intravenous contrast. Sagittal and coronal MPR images reconstructed from axial data set. COMPARISON:  None FINDINGS: Brain: Normal ventricular morphology. No midline shift or mass effect. Normal appearance of brain parenchyma. No intracranial hemorrhage, mass lesion, or evidence of acute infarction. No extra-axial fluid collections. Vascular: No hyperdense vessels. Atherosclerotic calcification of internal carotid and vertebral arteries at skull base. Skull: Intact Sinuses/Orbits: Clear  Other: N/A IMPRESSION: No acute intracranial abnormalities. Electronically Signed   By: Lavonia Dana M.D.   On: 08/02/2020 16:32   ECHOCARDIOGRAM LIMITED  Result Date: 08/03/2020    ECHOCARDIOGRAM LIMITED REPORT   Patient Name:   James [name] Date of Exam: 08/03/2020 Medical Rec #:  [id number]       Height:       67.0 in Accession #:    [id number]      Weight:       147.9 lb Date of Birth:  1971-02-16       BSA:          1.779 m Patient Age:    50 years        BP:           155/91 mmHg Patient Gender: M               HR:           129 bpm. Exam Location:  Inpatient Procedure: Limited Echo and Limited Color Doppler Indications:    Chest Pain R07.9  History:        Patient has prior history of Echocardiogram examinations, most                 recent 08/15/2015. PAD; Risk Factors:Hypertension, Current Smoker                 and Diabetes. GERD. End stage renal disease. Anemia. Prolonged                 QT Interval. History of cancer. Thrombocytopenia.  Sonographer:    Darlina Sicilian RDCS Referring Phys: [address] [place] Comments: Image acquisition challenging due to uncooperative patient. IMPRESSIONS  1. EF estimate limited as only para sternal images obtained no apical or sub costal images done due to patient pain and lack of cooperation . Left ventricular ejection fraction, by estimation, is 35 to 40%. The left ventricle has moderately decreased function. The left ventricle demonstrates global hypokinesis. The left ventricular internal cavity size was moderately dilated. There is moderate left ventricular hypertrophy.  2. Left atrial size was moderately dilated.  3. The mitral valve is abnormal. Mild mitral valve regurgitation. Moderate mitral annular calcification.  4. Calcified non coronary cusp. The aortic valve was not well visualized. Mild to moderate aortic valve sclerosis/calcification is present, without any evidence of aortic stenosis. FINDINGS  Left Ventricle: EF estimate limited as only  para sternal images obtained no apical or sub costal images done due to patient pain and lack of cooperation. Left  ventricular ejection fraction, by estimation, is 35 to 40%. The left ventricle has moderately decreased function. The left ventricle demonstrates global hypokinesis. The left ventricular internal cavity size was moderately dilated. There is moderate left ventricular hypertrophy. Left Atrium: Left atrial size was moderately dilated. Mitral Valve: The mitral valve is abnormal. There is mild thickening of the mitral valve leaflet(s). There is mild calcification of the mitral valve leaflet(s). Moderate mitral annular calcification. Mild mitral valve regurgitation. Aortic Valve: Calcified non coronary cusp. The aortic valve was not well visualized. Mild to moderate aortic valve sclerosis/calcification is present, without any evidence of aortic stenosis. LEFT VENTRICLE PLAX 2D LVIDd:         5.00 cm LVIDs:         4.10 cm LV PW:         1.30 cm LV IVS:        1.50 cm  LEFT ATRIUM         Index LA diam:    4.30 cm 2.42 cm/m Jenkins Rouge MD Electronically signed by Jenkins Rouge MD Signature Date/Time: 08/03/2020/4:01:32 PM    Final     Cardiac Studies   Echo 08/03/20: 1. EF estimate limited as only para sternal images obtained no apical or  sub costal images done due to patient pain and lack of cooperation . Left  ventricular ejection fraction, by estimation, is 35 to 40%. The left  ventricle has moderately decreased  function. The left ventricle demonstrates global hypokinesis. The left  ventricular internal cavity size was moderately dilated. There is moderate  left ventricular hypertrophy.  2. Left atrial size was moderately dilated.  3. The mitral valve is abnormal. Mild mitral valve regurgitation.  Moderate mitral annular calcification.  4. Calcified non coronary cusp. The aortic valve was not well visualized.  Mild to moderate aortic valve sclerosis/calcification is present, without   any evidence of aortic stenosis.    Echo 08/15/15: Study Conclusions   - Left ventricle: The cavity size was normal. There was mild  concentric hypertrophy. Systolic function was normal. The  estimated ejection fraction was in the range of 55% to 60%. Wall  motion was normal; there were no regional wall motion  abnormalities. Doppler parameters are consistent with abnormal  left ventricular relaxation (grade 1 diastolic dysfunction).  - Aortic valve: Transvalvular velocity was within the normal range.  There was no stenosis. There was no regurgitation.  - Mitral valve: Transvalvular velocity was within the normal range.  There was no evidence for stenosis. There was no regurgitation.  - Left atrium: The atrium was severely dilated.  - Right ventricle: The cavity size was normal. Wall thickness was  normal. Systolic function was normal.  - Tricuspid valve: There was no regurgitation.  - Pulmonic valve: Transvalvular velocity was within the normal  range. There was no evidence for stenosis. There was no  regurgitation.    Patient Profile     50 y.o. 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 Complex PMH as outlined in consult note. Recently in the hospital for ischemic foot s/p L TMA. He presented to the hospital1/10/2022after having a fall and being found confused by his aide that arrived, also  with some recollection of having had chest pain. He had been confirmed compliant with last scheduled HD session on Friday. He was hypertensive and tachycardic  (sinus) on arrival. He required BiPAP on arrival and quickly improved with NTG paste and BiPAP. CT angio ruled out dissection. Cardiology asked to see for elevated troponin.   Assessment & Plan    Acute encephalopathy Acute respiratory distress - pt febrile yesterday after HD with worsening confusion - given history of similar episode in the setting of infection, blood cultures obtained and empiric ABX started - he seems more lucid this morning, although mentation seems to wax and wane throughout the day - chronic pain/nerve pain   Chest pain NSTEMI - hs troponin 1049 --> 1925 --> 1965 - question demand ischemia - ischemic workup on hold for drop in Hb and suspected infectious process - pt denies chest pain - telemetry reviewed with ST elevation in one lead --> stat 12-lead showed LVH, no STEMI - reviewed with Dr. Acie Fredrickson   New systolic heart failure - EF estimated at 35-40%, although poor study due to patient noncompliance - continue to defer fluid management to nephrology and  HD - would favor ischemic workup at some point, but is not currently a candidate for invasive procedures or possibility of needing DAPT   Sinus tachycardia  - pt did not receive lopressor before HD yesterday --> HR in the 140-170s - increased lopressor to 50 mg q6hr - rates much better this morning, question contribution of ABX and increased BB   Anemia - Hb 7.4 --> drifting down - per primary    For questions or updates, please contact [facility] Please consult [email or web address] for contact info under        Signed, Ledora Bottcher, PA  08/04/2020, 8:32 AM    Attending Note:   The patient was seen and examined.  Agree with assessment and plan as noted above.  Changes made to the above note as needed.  Patient seen and independently examined with  Angie Duke, Pa .   We discussed all aspects of the encounter. I agree with the assessment and plan as stated above.  1 . NSTEMI:   Likely  demand ischemia.   He is not a candidate for invasive procedures at present due to altered mental status, ongoing GI bleed,   2.  CHF  :   Will proceed with ischemia eval once he is stable   3. Anemia:   Hb continues to drift down    I have spent a total of 40 minutes with patient reviewing hospital  notes , telemetry, EKGs, labs and examining patient as well as establishing an assessment and plan that was discussed with the patient. > 50% of time was spent in direct patient care.    Thayer Headings, Brooke Bonito., MD, [facility] 08/04/2020, 11:41 AM [address],  [place] Pager [phone]

## 2020-08-04 NOTE — Consult Note (Addendum)
[facility] Consult: 10:07 AM 08/04/2020  LOS: 3 days    Referring Provider: Dr Heber Pole Ojea.   Primary Care Physician:  [name], MD Primary Gastroenterologist: unassigned.  [facility].  Also seen by [facility] and Dr Cletus Gash in [facility].      Reason for Consultation: Anemia.  FOBT positive   HPI: James [name] is a 50 y.o. male.  PMH ESRD.  Renal transplant 2009, failed 2018 at which point he was started on peripheral dialysis and eventually hemodialysis..  C. difficile 10/2019.  DM 2.  Encephalopathy.  Peripheral vascular disease.  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  12/ 30/2021 left transmetatarsal amputation.  Lymphoproliferative disorder, B-cell lymphoma.  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  Anemia requiring blood transfusions (2 in 10/2019, 1 in 03/2019 per [facility] epic record), recieves Mircera at HD.  Prostate cancer, 04/2019.  03/2019 colonoscopy.  For unexplained diarrhea and IDA.  Dr. Watt Climes.  Congested, erythematous sigmoid mucosa, biopsied.  Small cecal polyp snared.  Otherwise normal study into the terminal ileum. Random biopsies obtained.    Pathology on polyp:  tubular adenoma with no HGD.  Random sigmoid and splenic flexure biopsies revealed tubular adenomas.  No microscopic colitis, no dysplasia, no infectious components 05/2020 colonoscopy was canceled when he forgot to inform the physicians that he was taking warfarin and he failed to complete his bowel prep. Patient has a colonoscopy February 1 with Dr. Lynita Lombard [facility] [place] [facility].   12/26 -07/28/2020 admission at [facility] with necrotic, gangrenous toes from his peripheral vascular disease it was during this admission he underwent the left transmetatarsal amputation.  Also addressed was  hypertensive urgency.  Was discharged to home under the care of sister and mother, declined inpatient rehab. Outpatient meds include 81 ASA, daily PO vitamin W80, daily folic acid, omeprazole 20 mg/day, prednisone 5 mg/day, warfarin.  Patient found confused at home and transported to [facility].  There is suspicion that he was taking more than prescribed narcotics.  Complaint of left chest pain.  Placed on BiPAP.  Acute encephalopathy is clearing.  Prolonged QT on EKG.  Cardiology suspect non-STEMI.  Empiric antibiotic for leukocytosis of unclear cause.  GI review of systems is also negative.  The only positive is recent anorexia and recent constipation in the setting of narcotics for control of pain in the amputated foot.  Denies nausea, vomiting, abdominal pain, black or bloody stools, dysphagia, pyrosis, NSAID use.  Hgb 8.8  >> 9.9 >> 6.7 >> 1 PRBC >>  7.4.  MCV 109.  Platelets normal.  INR 2. TSH WNL.   Low iron, low TIBC, normal folate.  Markedly elevated B12 and ferritin level.  T bili 0.7.  Alk phos 182.  AST/ALT 38/36 Troponins: 1049, 1925, 1964. 2D echo with LVEF 35 to 40%.  Mild mitral valve regurg.  Patient is adopted, family history unknown. Social history.  Does not drink does not use illicit drugs.  Smokes about 5 cigarettes/day.    Past Medical History:  Diagnosis Date  .  Anemia   . Avascular necrosis ([place])   . BPH (benign prostatic hyperplasia)   . Complication of anesthesia    slow to wake up  . ESRD (end stage renal disease) ([place])    failed kidney transplant  . GERD (gastroesophageal reflux disease)   . Glaucoma   . History of blood transfusion   . Hypertension   . Hypertriglyceridemia   . Influenza A   . 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 Peripheral vascular disease ([place])     s/p prior R BKA 07/2019 and recent L TMA 06/2020  . Pneumonia 04/21/2017   several times per pt on 04/22/20  .  Prolonged QT interval   . Prostate cancer ([place])    prostate cancer, s/p prostatectomy 04/24/19  . Renal disorder    dialysis m w f  . 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 He then got HD in [facility] until getting a renal transplant in 2009 at [place], [place].  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    . Smoker    tx wellbutriin  . 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 Tachycardia   . Thrombocytopenia ([place])   . Transplant recipient   . Type 2 diabetes mellitus ([place])    type 2 - pt states no meds, diet controlled as of 04/22/20    Past Surgical History:  Procedure Laterality Date  . AV FISTULA PLACEMENT Right 11/02/2019   Procedure: ARTERIOVENOUS (AV) FISTULA CREATION RIGHT UPPER ARM;  Surgeon: Elam Dutch, MD;  Location: [facility] OR;  Service: Vascular;  Laterality: Right;  . AV FISTULA PLACEMENT Right 04/25/2020   Procedure: INSERTION OF ARTERIOVENOUS (AV) GORE-TEX GRAFT ARM;  Surgeon: Elam Dutch, MD;  Location: [place];  Service: Vascular;  Laterality: Right;  . BASCILIC VEIN TRANSPOSITION Right 11/02/2019   Procedure: Right Cephalic Vein Transposition;  Surgeon: Elam Dutch, MD;  Location: [place];  Service: Vascular;  Laterality: Right;  . BIOPSY  03/31/2019   Procedure: BIOPSY;  Surgeon: Clarene Essex, MD;  Location: [place];  Service: Endoscopy;;  . CAPD REMOVAL Left 11/04/2019   Procedure: CONTINUOUS AMBULATORY PERITONEAL DIALYSIS  (CAPD) CATHETER REMOVAL;  Surgeon: Ralene Ok, MD;  Location: [place];  Service: General;  Laterality: Left;  . CHOLECYSTECTOMY    . COLONOSCOPY WITH PROPOFOL N/A 03/31/2019   Procedure: COLONOSCOPY WITH PROPOFOL;  Surgeon: Clarene Essex, MD;  Location: [place];  Service: Endoscopy;  Laterality:  N/A;  . HIP SURGERY    . INSERTION OF DIALYSIS CATHETER Left 04/24/2017   Procedure: EXCHANGE  OF DIALYSIS CATHETER;  Surgeon: Rosetta Posner, MD;  Location: [place];  Service: Vascular;  Laterality: Left;  . INSERTION OF DIALYSIS CATHETER Left 11/02/2019   Procedure: INSERTION OF TUNNELED DIALYSIS CATHETER LEFT INTERNAL JUGULAR;  Surgeon: Elam Dutch, MD;  Location: [place];  Service: Vascular;  Laterality: Left;  . JOINT REPLACEMENT Right    hip  . KIDNEY TRANSPLANT Right   . PERITONEAL CATHETER INSERTION Left 08/2017  . POLYPECTOMY  03/31/2019   Procedure: POLYPECTOMY;  Surgeon: Clarene Essex, MD;  Location: [place];  Service: Endoscopy;;  .  VENOGRAM N/A 11/02/2019   Procedure: Central Venogram;  Surgeon: Elam Dutch, MD;  Location: [facility] OR;  Service: Vascular;  Laterality: N/A;    Prior to Admission medications   Medication Sig Start Date End Date Taking? Authorizing Provider  acetaminophen (TYLENOL) 500 MG tablet Take 500-1,000 mg by mouth every 6 (six) hours as needed (pain or headaches).    Yes [provider]  amLODipine (NORVASC) 10 MG tablet Take 1 tablet (10 mg total) by mouth daily. 11/04/19 04/14/21 Yes Alma Friendly, MD  aspirin EC 81 MG tablet Take 81 mg by mouth daily. Swallow whole.   Yes [provider]  diphenhydrAMINE (BENADRYL) 25 mg capsule Take 25 mg by mouth every 6 (six) hours as needed for itching.    Yes [provider]  warfarin (COUMADIN) 5 MG tablet Take 5-7.5 mg by mouth See admin instructions. Take 1.5 tablets (7.5 mg) by mouth in the evenings on Mondays, Wednesdays, & Fridays. Take 1 tablet (5 mg) by mouth in the evening on Sundays, Tuesdays, Thursdays, & Saturdays. 10/11/19  Yes [provider]  buPROPion (WELLBUTRIN XL) 150 MG 24 hr tablet Take 150 mg by mouth daily.    [provider]  Cyanocobalamin (B-12) 500 MCG TABS Take 500 mcg by mouth daily.  08/12/19   [provider]  doxazosin (CARDURA)  2 MG tablet Take 2 mg by mouth at bedtime.    [provider]  ferric citrate (AURYXIA) 1 GM 210 MG(Fe) tablet Take 210-420 mg by mouth See admin instructions. Take 2 tablets (420 mg) by mouth up to three times daily with meals and 1 tablet (210 mg) with snacks    [provider]  gabapentin (NEURONTIN) 100 MG capsule Take 100 mg by mouth at bedtime. 03/11/20   [provider]  insulin aspart (NOVOLOG FLEXPEN) 100 UNIT/ML FlexPen Inject 10 Units into the skin 3 (three) times daily with meals. Patient taking differently: Inject 2 Units into the skin 3 (three) times daily as needed for high blood sugar (CBG >180). 04/25/17   Mariel Aloe, MD  Insulin Detemir (LEVEMIR FLEXTOUCH) 100 UNIT/ML Pen Inject 15 Units into the skin at bedtime. Patient not taking: Reported on 08/01/2020 09/23/17   Regalado, Jerald Kief A, MD  latanoprost (XALATAN) 0.005 % ophthalmic solution Place 1 drop into both eyes at bedtime.     [provider]  lisinopril (ZESTRIL) 10 MG tablet Take 10 mg by mouth at bedtime. 03/11/20   [provider]  loperamide (IMODIUM) 2 MG capsule Take 2 mg by mouth as needed for diarrhea or loose stools.     [provider]  Melatonin 5 MG CAPS Take 5 mg by mouth at bedtime as needed (sleep).    [provider]  metoprolol tartrate (LOPRESSOR) 100 MG tablet Take 100 mg by mouth 2 (two) times daily.  10/13/19   [provider]  omeprazole (PRILOSEC) 20 MG capsule Take 20 mg by mouth daily.     [provider]  oxyCODONE (OXY IR/ROXICODONE) 5 MG immediate release tablet Take 5 mg by mouth every 6 (six) hours as needed (pain). 07/28/20   [provider]  oxyCODONE-acetaminophen (PERCOCET) 5-325 MG tablet Take 1 tablet by mouth every 6 (six) hours as needed. 04/25/20   Rhyne, Hulen Shouts, PA-C  predniSONE (DELTASONE) 5 MG tablet Take 5 mg by mouth daily with breakfast.     [provider]  tacrolimus (PROGRAF) 0.5 MG  capsule Take 0.5 mg by  mouth daily.  Patient not taking: Reported on 08/02/2020    [provider]  triamcinolone cream (KENALOG) 0.1 % Apply 1 application topically daily. (dermatitis) After bathing    [provider]  valGANciclovir (VALCYTE) 450 MG tablet Take 450 mg by mouth every Monday, Wednesday, and Friday with hemodialysis.  08/22/17   [provider]  zolpidem (AMBIEN) 10 MG tablet Take 10 mg by mouth at bedtime as needed for sleep.     [provider]    Scheduled Meds: . sodium chloride   Intravenous Once  . amLODipine  10 mg Oral Daily  . Chlorhexidine Gluconate Cloth  6 each Topical Q0600  . insulin aspart  0-6 Units Subcutaneous TID WC  . lidocaine  1 patch Transdermal Q24H  . metoprolol tartrate  50 mg Oral Q6H  . predniSONE  5 mg Oral Q breakfast  . tacrolimus  0.5 mg Oral Daily   Infusions: . cefTRIAXone (ROCEPHIN)  IV 2 g (08/03/20 2046)  . [START ON 08/05/2020] vancomycin     PRN Meds: acetaminophen **OR** acetaminophen, oxyCODONE, polyethylene glycol   Allergies as of 08/01/2020 - Review Complete 08/01/2020  Allergen Reaction Noted  . Tramadol hcl Shortness Of Breath and Other (See Comments) 08/05/2009  . Nsaids Other (See Comments) 03/27/2019  . Darvon [propoxyphene] Rash and Other (See Comments) 04/09/2014    Family History  Adopted: Yes    Social History   Socioeconomic History  . Marital status: Single    Spouse name: Not on file  . Number of children: Not on file  . Years of education: Not on file  . Highest education level: Not on file  Occupational History  . Not on file  Tobacco Use  . Smoking status: Current Every Day Smoker    Packs/day: 0.25    Years: 26.00    Pack years: 6.50    Types: Cigarettes  . Smokeless tobacco: Never Used  Vaping Use  . Vaping Use: Never used  Substance and Sexual Activity  . Alcohol use: No  . Drug use: No  . Sexual activity: Not on file  Other Topics Concern  . Not  on file  Social History Narrative  . Not on file   Social Determinants of Health   Financial Resource Strain: Not on file  Food Insecurity: Not on file  Transportation Needs: Not on file  Physical Activity: Not on file  Stress: Not on file  Social Connections: Not on file  Intimate Partner Violence: Not on file    REVIEW OF SYSTEMS: Constitutional: Some weakness.  No profound fatigue ENT:  No nose bleeds Pulm: At most dry cough.  No dyspnea. CV:  No palpitations, no LE edema.  Chest pain resolved GU:  No hematuria, no frequency GI: See HPI Heme: [place] excessive or unusual bleeding or bruising.  Sees Mircera and Hectorol at dialysis Transfusions: Recalls having had transfusions in the past but vague on details as to how recently and how often. Neuro:  No headaches, no peripheral tingling or numbness Derm:  No itching, no rash or sores.  Endocrine:  No sweats or chills.  No polyuria or dysuria Immunization: Not queried Travel:  None beyond local counties in last few months.    PHYSICAL EXAM: Vital signs in last 24 hours: Vitals:   08/04/20 0828 08/04/20 0829  BP: (!) 145/80   Pulse:    Resp:    Temp:  98.4 F (36.9 C)  SpO2:     Wt Readings from  Last 3 Encounters:  08/04/20 63.7 kg  04/25/20 67.1 kg  03/10/20 68 kg    General: Looks chronically ill.  Alert. Head: No facial asymmetry or swelling.  No signs of head trauma. Eyes: No conjunctival pallor or scleral icterus.  EOMI Ears: No hearing deficit Nose: No congestion or discharge Mouth: Tongue midline.  Mucosa pink, moist, clear. Neck: No JVD, masses, thyromegaly Lungs: Clear bilaterally.  No labored breathing.  No cough Heart: RRR.  No MRG.  S1, S2 Abdomen: Soft with active bowel sounds.  Not tender or distended.  No HSM, masses, bruits, hernia.   Rectal: Deferred Musc/Skeltl: Right BKA, site well-healed.  Left transmetatarsal amputation with intact incision line.  This is somewhat tender to the touch and  swollen but not erythematous Extremities: No CCE. Neurologic: Oriented x3.  Has trouble recalling details however.  Is all 4 limbs without tremor.  Strength not tested Skin: No suspicious sores, rashes or lesions Nodes: No cervical LAD Psych: Somewhat anxious but cooperative pleasant with fluid speech.  Intake/Output from previous day: 01/12 0701 - 01/13 0700 In: -  Out: 1195 [Urine:100] Intake/Output this shift: Total I/O In: 240 [P.O.:240] Out: -   LAB RESULTS: Recent Labs    08/03/20 0249 08/03/20 1248 08/04/20 0249  WBC 10.3 9.1 8.2  HGB 8.3* 7.8* 7.4*  HCT 24.7* 23.9* 22.7*  PLT 205 178 160   BMET Lab Results  Component Value Date   NA 139 08/04/2020   NA 136 08/03/2020   NA 135 08/03/2020   K 4.2 08/04/2020   K 2.9 (L) 08/03/2020   K 4.2 08/03/2020   CL 97 (L) 08/04/2020   CL 95 (L) 08/03/2020   CL 92 (L) 08/03/2020   CO2 28 08/04/2020   CO2 27 08/03/2020   CO2 25 08/03/2020   GLUCOSE 86 08/04/2020   GLUCOSE 87 08/03/2020   GLUCOSE 85 08/03/2020   BUN 17 08/04/2020   BUN 10 08/03/2020   BUN 41 (H) 08/03/2020   CREATININE 5.33 (H) 08/04/2020   CREATININE 2.94 (H) 08/03/2020   CREATININE 8.80 (H) 08/03/2020   CALCIUM 9.6 08/04/2020   CALCIUM 9.2 08/03/2020   CALCIUM 9.5 08/03/2020   LFT Recent Labs    08/02/20 0628 08/03/20 1248 08/04/20 0249  PROT 6.6  --  7.5  ALBUMIN 2.4*  2.4* 2.5* 2.3*  AST 38  --  38  ALT 35  --  36  ALKPHOS 209*  --  182*  BILITOT 0.7  --  0.7  BILIDIR 0.3*  --   --   IBILI 0.4  --   --    PT/INR Lab Results  Component Value Date   INR 2.0 (H) 08/02/2020   INR 2.1 (H) 08/01/2020   INR 1.1 04/25/2020   Hepatitis Panel Recent Labs    08/02/20 0630  HEPBSAG NON REACTIVE   C-Diff No components found for: CDIFF Lipase     Component Value Date/Time   LIPASE 24 10/28/2019 1024    Drugs of Abuse  No results found for: LABOPIA, COCAINSCRNUR, LABBENZ, AMPHETMU, THCU, LABBARB   RADIOLOGY STUDIES: CT HEAD  WO CONTRAST  Result Date: 08/02/2020 CLINICAL DATA:  Mental status changes of unknown cause, acute encephalopathy suspect metabolic, recent splenic infarct; history end-stage renal disease, hypertension, B-cell lymphoproliferative disorder, type II diabetes mellitus, smoker EXAM: CT HEAD WITHOUT CONTRAST TECHNIQUE: Contiguous axial images were obtained from the base of the skull through the vertex without intravenous contrast. Sagittal and coronal MPR images reconstructed from  axial data set. COMPARISON:  None FINDINGS: Brain: Normal ventricular morphology. No midline shift or mass effect. Normal appearance of brain parenchyma. No intracranial hemorrhage, mass lesion, or evidence of acute infarction. No extra-axial fluid collections. Vascular: No hyperdense vessels. Atherosclerotic calcification of internal carotid and vertebral arteries at skull base. Skull: Intact Sinuses/Orbits: Clear Other: N/A IMPRESSION: No acute intracranial abnormalities. Electronically Signed   By: Lavonia Dana M.D.   On: 08/02/2020 16:32   ECHOCARDIOGRAM LIMITED  Result Date: 08/03/2020    ECHOCARDIOGRAM LIMITED REPORT   Patient Name:   GAINES CARTMELL Date of Exam: 08/03/2020 Medical Rec #:  [id number]       Height:       67.0 in Accession #:    [id number]      Weight:       147.9 lb Date of Birth:  [birth year]       BSA:          1.779 m Patient Age:    19 years        BP:           155/91 mmHg Patient Gender: M               HR:           129 bpm. Exam Location:  Inpatient Procedure: Limited Echo and Limited Color Doppler Indications:    Chest Pain R07.9  History:        Patient has prior history of Echocardiogram examinations, most                 recent 08/15/2015. PAD; Risk Factors:Hypertension, Current Smoker                 and Diabetes. GERD. End stage renal disease. Anemia. Prolonged                 QT Interval. History of cancer. Thrombocytopenia.  Sonographer:    Darlina Sicilian RDCS Referring Phys: [id number] [place] Comments: Image acquisition challenging due to uncooperative patient. IMPRESSIONS  1. EF estimate limited as only para sternal images obtained no apical or sub costal images done due to patient pain and lack of cooperation . Left ventricular ejection fraction, by estimation, is 35 to 40%. The left ventricle has moderately decreased function. The left ventricle demonstrates global hypokinesis. The left ventricular internal cavity size was moderately dilated. There is moderate left ventricular hypertrophy.  2. Left atrial size was moderately dilated.  3. The mitral valve is abnormal. Mild mitral valve regurgitation. Moderate mitral annular calcification.  4. Calcified non coronary cusp. The aortic valve was not well visualized. Mild to moderate aortic valve sclerosis/calcification is present, without any evidence of aortic stenosis. FINDINGS  Left Ventricle: EF estimate limited as only para sternal images obtained no apical or sub costal images done due to patient pain and lack of cooperation. Left ventricular ejection fraction, by estimation, is 35 to 40%. The left ventricle has moderately decreased function. The left ventricle demonstrates global hypokinesis. The left ventricular internal cavity size was moderately dilated. There is moderate left ventricular hypertrophy. Left Atrium: Left atrial size was moderately dilated. Mitral Valve: The mitral valve is abnormal. There is mild thickening of the mitral valve leaflet(s). There is mild calcification of the mitral valve leaflet(s). Moderate mitral annular calcification. Mild mitral valve regurgitation. Aortic Valve: Calcified non coronary cusp. The aortic valve was not well visualized. Mild to moderate aortic valve sclerosis/calcification is present, without any evidence  of aortic stenosis. LEFT VENTRICLE PLAX 2D LVIDd:         5.00 cm LVIDs:         4.10 cm LV PW:         1.30 cm LV IVS:        1.50 cm  LEFT ATRIUM         Index LA diam:    4.30 cm 2.42  cm/m Jenkins Rouge MD Electronically signed by Jenkins Rouge MD Signature Date/Time: 08/03/2020/4:01:32 PM    Final      IMPRESSION:   *   Acute on chronic anemia, responded well to 1 PRBC.  FOBT positive but no history melena, bloody emesis or stools. Macrocytic.  Low iron, low TIBC, normal folate.  Markedly elevated B12 and ferritin level.   [facility] Rast at HD. Previous colonoscopy in 03/2019 with tubular adenomatous polyps and no source for diarrhea.  No previous EGD. Has colonoscopy set for 2/1 w Dr Lynita Lombard in [place].   Hx B cell lymphoma.    *   ESRD, on hemodialysis MWF.  Renal transplant failed after about 10 years. On prograf, low dose Prednisone,   *    Chronic Coumadin.  History of splenic infarct, peripheral vascular disease. On hold.  Latest INR 2.    *  Leukocytosis.  On empiric Rocephin, Vanc.  ? Could this be post amputation foot infection. clx's negative to date.      *   Prostate cancer.  Prostatectomy 04/2019  *   Hx thrombocytopenia in setting splenic infarct, platelets normal currently.      PLAN:     *   Per Dr Ardis Hughs. ? Pursue EGD since has never had one of these vs defer back to Dr Lynita Lombard, Dan Maker MD?.     Azucena Freed  08/04/2020, 10:07 AM Phone [phone]   ________________________________________________________________________  Velora Heckler GI MD note:  I personally examined the patient, reviewed the data and agree with the assessment and plan described above.  50 yo man with multiple serious comorbid conditions as above. He's had no overt GI bleeding however his Hb since admission for MS changes has continued to drift. He responded to a single unit of blood two days ago. Undoubtedly his anemia is multifactorial (chronic disease, renal failure, on coumadin chronically.  Stools are heme +. He was going to have a colonoscopy in [place] in 2-3 weeks for h/o polyps, chronic anemia.  Cardiology is considering workup for ischemic heart disease and  I think it is reasonable to proceed with colonoscopy and EGD while he is in the hospital. Las INR was 2.0 (two days ago), I'm rechecking it now to see if we can go ahead with the GI testing tomorrow.   Owens Loffler, MD [facility] Gastroenterology Pager [phone]

## 2020-08-04 NOTE — Progress Notes (Signed)
HD#3 Subjective:   Overnight Events: No acute events overnight. Tmax 98.8. Receiving scheduled Tylenol.  During evaluation this morning, the patient states he is doing better in terms of his memory. He can recall the reason he came to the hospital. Notes the cardiologist came by in the morning. Discussed fever yesterday evening. Reports he has persistent R foot pain, though notes the lidocaine patch has been helpful. Denies cough, headaches, abdominal pain.   Objective:  Vital signs in last 24 hours: Vitals:   08/04/20 0434 08/04/20 0754 08/04/20 0828 08/04/20 0829  BP: 137/76 (!) 145/83 (!) 145/80   Pulse: 99 93    Resp: 17 16    Temp: 98.8 F (37.1 C) 98.4 F (36.9 C)  98.4 F (36.9 C)  TempSrc: Oral Oral    SpO2: 96% 96%    Weight: 63.7 kg      Supplemental O2: Nasal Cannula SpO2: 96 % O2 Flow Rate (L/min): 4 L/min FiO2 (%): 50 %   Physical Exam:   Physical Exam Vitals and nursing note reviewed.  Constitutional:      Appearance: Normal appearance.  HENT:     Head: Normocephalic and atraumatic.  Cardiovascular:     Rate and Rhythm: Normal rate and regular rhythm.     Pulses: Normal pulses.     Heart sounds: Normal heart sounds. No murmur heard. No gallop.   Pulmonary:     Effort: Pulmonary effort is normal. No respiratory distress.     Breath sounds: Normal breath sounds. No stridor. No wheezing, rhonchi or rales.  Abdominal:     Tenderness: There is no abdominal tenderness.  Musculoskeletal:     Comments: Serosanguinous drainage noted from LLE, exquisite tenderness to the lateral aspect of his foot.   Neurological:     General: No focal deficit present.     Mental Status: He is alert.  Psychiatric:     Comments: agitated     Filed Weights   08/03/20 1332 08/04/20 0434  Weight: 62.8 kg 63.7 kg     Intake/Output Summary (Last 24 hours) at 08/04/2020 1006 Last data filed at 08/04/2020 0800 Gross per 24 hour  Intake 240 ml  Output 1195 ml  Net  -955 ml   Net IO Since Admission: -3,677 mL [08/04/20 1006]  Pertinent Labs: CBC Latest Ref Rng & Units 08/04/2020 08/03/2020 08/03/2020  WBC 4.0 - 10.5 K/uL 8.2 9.1 10.3  Hemoglobin 13.0 - 17.0 g/dL 7.4(L) 7.8(L) 8.3(L)  Hematocrit 39.0 - 52.0 % 22.7(L) 23.9(L) 24.7(L)  Platelets 150 - 400 K/uL 160 178 205    CMP Latest Ref Rng & Units 08/04/2020 08/03/2020 08/03/2020  Glucose 70 - 99 mg/dL 86 87 85  BUN 6 - 20 mg/dL 17 10 41(H)  Creatinine 0.61 - 1.24 mg/dL 5.33(H) 2.94(H) 8.80(H)  Sodium 135 - 145 mmol/L 139 136 135  Potassium 3.5 - 5.1 mmol/L 4.2 2.9(L) 4.2  Chloride 98 - 111 mmol/L 97(L) 95(L) 92(L)  CO2 22 - 32 mmol/L 28 27 25   Calcium 8.9 - 10.3 mg/dL 9.6 9.2 9.5  Total Protein 6.5 - 8.1 g/dL 7.5 - -  Total Bilirubin 0.3 - 1.2 mg/dL 0.7 - -  Alkaline Phos 38 - 126 U/L 182(H) - -  AST 15 - 41 U/L 38 - -  ALT 0 - 44 U/L 36 - -    Imaging: ECHOCARDIOGRAM LIMITED  Result Date: 08/03/2020    ECHOCARDIOGRAM LIMITED REPORT   Patient Name:   James [name] Date  of Exam: 08/03/2020 Medical Rec #:  [id number]       Height:       67.0 in Accession #:    [id number]      Weight:       147.9 lb Date of Birth:  1971/04/26       BSA:          1.779 m Patient Age:    50 years        BP:           155/91 mmHg Patient Gender: M               HR:           129 bpm. Exam Location:  Inpatient Procedure: Limited Echo and Limited Color Doppler Indications:    Chest Pain R07.9  History:        Patient has prior history of Echocardiogram examinations, most                 recent 08/15/2015. PAD; Risk Factors:Hypertension, Current Smoker                 and Diabetes. GERD. End stage renal disease. Anemia. Prolonged                 QT Interval. History of cancer. Thrombocytopenia.  Sonographer:    Darlina Sicilian RDCS Referring Phys: [address] [place] Comments: Image acquisition challenging due to uncooperative patient. IMPRESSIONS  1. EF estimate limited as only para sternal images obtained no apical  or sub costal images done due to patient pain and lack of cooperation . Left ventricular ejection fraction, by estimation, is 35 to 40%. The left ventricle has moderately decreased function. The left ventricle demonstrates global hypokinesis. The left ventricular internal cavity size was moderately dilated. There is moderate left ventricular hypertrophy.  2. Left atrial size was moderately dilated.  3. The mitral valve is abnormal. Mild mitral valve regurgitation. Moderate mitral annular calcification.  4. Calcified non coronary cusp. The aortic valve was not well visualized. Mild to moderate aortic valve sclerosis/calcification is present, without any evidence of aortic stenosis. FINDINGS  Left Ventricle: EF estimate limited as only para sternal images obtained no apical or sub costal images done due to patient pain and lack of cooperation. Left ventricular ejection fraction, by estimation, is 35 to 40%. The left ventricle has moderately decreased function. The left ventricle demonstrates global hypokinesis. The left ventricular internal cavity size was moderately dilated. There is moderate left ventricular hypertrophy. Left Atrium: Left atrial size was moderately dilated. Mitral Valve: The mitral valve is abnormal. There is mild thickening of the mitral valve leaflet(s). There is mild calcification of the mitral valve leaflet(s). Moderate mitral annular calcification. Mild mitral valve regurgitation. Aortic Valve: Calcified non coronary cusp. The aortic valve was not well visualized. Mild to moderate aortic valve sclerosis/calcification is present, without any evidence of aortic stenosis. LEFT VENTRICLE PLAX 2D LVIDd:         5.00 cm LVIDs:         4.10 cm LV PW:         1.30 cm LV IVS:        1.50 cm  LEFT ATRIUM         Index LA diam:    4.30 cm 2.42 cm/m Jenkins Rouge MD Electronically signed by Jenkins Rouge MD Signature Date/Time: 08/03/2020/4:01:32 PM    Final     Assessment/Plan:   Active Problems:  Tachycardia   End stage renal disease (HCC)   Anemia   Renal transplant recipient   Respiratory failure [facility])   Patient Summary:  James [name] is a 50 y.o. with pertinent PMH of  s/p failed transplant on ESRD HD MWF followed by [facility], lymphoma 2013, HTN, BPH, prostate cancer, T2DM, PVD s/p right BKA and left TMA  who presented with shortness of breath and altered mental status and admitted for acute hypoxic respiratory failure, troponemia, and electrolyte abnormalities.  #Acute Encephalopathy #Anion Gap Metabolic Acidosis Patient's confusion seems to have resolved at this time. BUN has improved since admission, 66>82>17. Due to nature of waxing/waning mental status and improvement with dialysis, suspect metabolic in etiology. With patient's fluctuating leukocytosis and fever, abx were started yesterday, may also be contributing to improvement in mental status. Will continue to monitor for signs of other etiologies at this time.  -continue to monitor mental status -patient without neurological deficits -dialysis per nephro.  -daily bmp  #Leukocytosis #Fever  WBC of 20 upon admission, improved to 8.2. Patient did spike a 102 degree fever and continued to be encephalopathic yesterday, BC, vancomycin, and Ceftriaxone were  empirically started. No fevers since, but is receiving tylenol. Patient had LLE pain this AM and the surgical site showed serosanguinous drainage.  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 Admitted at [facility] 07/17/2020-07/28/2020 for left transmetatarsal transection. At discharge patient advised to attend SNF facility however he requested to be discharged home. This AM examination of his LLE was draining and exquisitely tender to palpation. Given recent surgery will obtain MRI  to rule out osteomyelitis/abscess. Patient notes he was recently "dropped on his foot" during a dialysis session, hematoma also in differential for his pain. - MRI LLE WO Contrast -wound care eval - daily dressing changes, apply xeroform gauze. -consider SNF placement at discharge -follow up with surgeons once discharge for suture removal -PT/OT eval ordered now that patient's mental status has improved  #Acute Hypoxic Respiratory Failure  #Chest Pain Saturating well on room air, currently denies shortness of breath. Denies new episodes of chest pain.  -continue BiPAP - O2 goal of 90-94% -CTA chest negative for aortic dissection -dialysis per nephro  #Anemia Morning Hgb of 7.8 from 8.3 this am. Uncertain etiology as to where blood loss is coming from, but has had a +FOBT and has a colonoscopy/endoscopy scheduled at [facility]. Ferritin of 3,132, iron of 40, TIBC of 147. Folate of 21.2 and B12 of 3,009  Consistent with anemia of chronic disease/possibel acute infection.  -GI consulted saw the patient today and recommend colonoscopy and endoscopy tomorrow. -daily cbc -consider starting Protonix daily -transfuse hgb under 7.0 -hold anticoagulants  #ESRD on Dialysis MWF #Hx of Failed Kidney Transplant 2018 #Hyperkalemia Dialysis tomorrow, will have to coordinate with GI procedures. Will trend electrolytes -continue prednisone and tacrolimus in context of renal transplant -per nephro  #Sinus Tachycardia #QT Prolongation Hx of sinus tachycardia, resuming home metoprolol 50 mg q6h. HR has improved since restarting.  -continue to monitor on tele -avoid qtc prolonging medications  #Troponemia Elevated troponin's upon admission. Continue to suspect secondary to demand ischemia in setting of uncontrolled HTN.  -cards to proceed with invasive procedure once encephalopathy resolved as well as evaluation of GI bleed is performed.   #New Systolic HF  EF 32-67%, LV  moderately decreased function.  Global hypokinesis. Cardiology would like to perform further ischemic evaluation, but patient unstable with anemia and recent metabolic encephalopathy.  -continue further workup, further cardiology intervention once stable  #Hx of HTN Home medications of amlodipine, lisinopril. HTN improving -continue home amlodipine, metoprolol -holding home lisinopril in setting hyperkalemia  #Splenic Infarct Currently on warfarin. INR of 1.5, theraputic -hold anticoagulants in setting of hgb of 6.7 and suspected GI bleed  Diet: Renal IVF: None,None VTE: SCDs Code: Full  Dispo: With recent transmetatarsal amputation, would recommend patient be admitted to SNF facility.   Sanjuana Letters DO Internal Medicine Resident PGY-1 Pager [phone] Please contact the on call pager after 5 pm and on weekends at [phone].

## 2020-08-05 ENCOUNTER — Encounter (HOSPITAL_COMMUNITY)
Admission: EM | Disposition: A | Discharge status: Home Health | Payer: Self-pay | Source: Home / Self Care | Attending: Internal Medicine

## 2020-08-05 ENCOUNTER — Inpatient Hospital Stay (HOSPITAL_COMMUNITY): Payer: Medicare Other | Admitting: Certified Registered Nurse Anesthetist

## 2020-08-05 ENCOUNTER — Encounter (HOSPITAL_COMMUNITY): Payer: Self-pay | Admitting: Internal Medicine

## 2020-08-05 ENCOUNTER — Inpatient Hospital Stay (HOSPITAL_COMMUNITY): Payer: Medicare Other

## 2020-08-05 ENCOUNTER — Other Ambulatory Visit: Payer: Self-pay

## 2020-08-05 DIAGNOSIS — K635 Polyp of colon: Secondary | ICD-10-CM | POA: Diagnosis not present

## 2020-08-05 DIAGNOSIS — G9340 Encephalopathy, unspecified: Secondary | ICD-10-CM | POA: Diagnosis not present

## 2020-08-05 DIAGNOSIS — R000 Tachycardia, unspecified: Secondary | ICD-10-CM | POA: Diagnosis not present

## 2020-08-05 DIAGNOSIS — K2970 Gastritis, unspecified, without bleeding: Secondary | ICD-10-CM | POA: Diagnosis not present

## 2020-08-05 DIAGNOSIS — D509 Iron deficiency anemia, unspecified: Secondary | ICD-10-CM | POA: Diagnosis not present

## 2020-08-05 DIAGNOSIS — J810 Acute pulmonary edema: Secondary | ICD-10-CM | POA: Diagnosis not present

## 2020-08-05 HISTORY — PX: ESOPHAGOGASTRODUODENOSCOPY (EGD) WITH PROPOFOL: SHX5813

## 2020-08-05 HISTORY — PX: BIOPSY: SHX5522

## 2020-08-05 HISTORY — PX: POLYPECTOMY: SHX5525

## 2020-08-05 HISTORY — PX: COLONOSCOPY WITH PROPOFOL: SHX5780

## 2020-08-05 LAB — GLUCOSE, CAPILLARY
Glucose-Capillary: 112 mg/dL — ABNORMAL HIGH (ref 70–99)
Glucose-Capillary: 86 mg/dL (ref 70–99)
Glucose-Capillary: 104 mg/dL — ABNORMAL HIGH (ref 70–99)
Glucose-Capillary: 92 mg/dL (ref 70–99)
Glucose-Capillary: 108 mg/dL — ABNORMAL HIGH (ref 70–99)

## 2020-08-05 LAB — BASIC METABOLIC PANEL
Anion gap: 19 — ABNORMAL HIGH (ref 5–15)
BUN: 31 mg/dL — ABNORMAL HIGH (ref 6–20)
CO2: 23 mmol/L (ref 22–32)
Calcium: 10 mg/dL (ref 8.9–10.3)
Chloride: 96 mmol/L — ABNORMAL LOW (ref 98–111)
Creatinine, Ser: 7.99 mg/dL — ABNORMAL HIGH (ref 0.61–1.24)
GFR, Estimated: 8 mL/min — ABNORMAL LOW (ref >60)
Glucose, Bld: 69 mg/dL — ABNORMAL LOW (ref 70–99)
Potassium: 3.9 mmol/L (ref 3.5–5.1)
Sodium: 138 mmol/L (ref 135–145)

## 2020-08-05 LAB — CBC
HCT: 24.3 % — ABNORMAL LOW (ref 39.0–52.0)
Hemoglobin: 7.5 g/dL — ABNORMAL LOW (ref 13.0–17.0)
MCH: 34.2 pg — ABNORMAL HIGH (ref 26.0–34.0)
MCHC: 30.9 g/dL (ref 30.0–36.0)
MCV: 111 fL — ABNORMAL HIGH (ref 80.0–100.0)
Platelets: 169 10*3/uL (ref 150–400)
RBC: 2.19 MIL/uL — ABNORMAL LOW (ref 4.22–5.81)
RDW: 15.7 % — ABNORMAL HIGH (ref 11.5–15.5)
WBC: 8.8 10*3/uL (ref 4.0–10.5)
nRBC: 0 % (ref 0.0–0.2)

## 2020-08-05 LAB — MRSA PCR SCREENING: MRSA by PCR: POSITIVE — AB

## 2020-08-05 SURGERY — COLONOSCOPY WITH PROPOFOL
Anesthesia: Monitor Anesthesia Care

## 2020-08-05 MED ORDER — OXYCODONE HCL 5 MG PO TABS
5.0000 mg | ORAL_TABLET | Freq: Once | ORAL | Status: AC
  Administered 2020-08-05: 5 mg via ORAL
  Filled 2020-08-05: qty 1

## 2020-08-05 MED ORDER — VANCOMYCIN HCL 750 MG/150ML IV SOLN
750.0000 mg | INTRAVENOUS | Status: DC
  Filled 2020-08-05 (×2): qty 150

## 2020-08-05 MED ORDER — VANCOMYCIN HCL IN DEXTROSE 750-5 MG/150ML-% IV SOLN
750.0000 mg | INTRAVENOUS | Status: AC
  Administered 2020-08-06: 750 mg via INTRAVENOUS
  Filled 2020-08-05 (×2): qty 150

## 2020-08-05 MED ORDER — DARBEPOETIN ALFA 100 MCG/0.5ML IJ SOSY: 100.0000 ug | PREFILLED_SYRINGE | INTRAMUSCULAR | Status: DC

## 2020-08-05 MED ORDER — SODIUM CHLORIDE 0.9 % IV SOLN: INTRAVENOUS | Status: DC | PRN

## 2020-08-05 MED ORDER — PROPOFOL 500 MG/50ML IV EMUL
INTRAVENOUS | Status: DC | PRN
  Administered 2020-08-05: 100 ug/kg/min via INTRAVENOUS
  Administered 2020-08-05: 115 ug/kg/min via INTRAVENOUS

## 2020-08-05 MED ORDER — PROPOFOL 10 MG/ML IV BOLUS
INTRAVENOUS | Status: DC | PRN
  Administered 2020-08-05 (×5): 10 mg via INTRAVENOUS

## 2020-08-05 MED ORDER — SODIUM CHLORIDE 0.9 % IV SOLN: INTRAVENOUS | Status: DC

## 2020-08-05 MED ORDER — OXYCODONE-ACETAMINOPHEN 5-325 MG PO TABS
1.0000 | ORAL_TABLET | Freq: Four times a day (QID) | ORAL | Status: DC | PRN
Start: 2020-08-05 — End: 2020-08-11
  Administered 2020-08-05 – 2020-08-10 (×16): 1 via ORAL
  Filled 2020-08-05 (×15): qty 1

## 2020-08-05 MED ORDER — DARBEPOETIN ALFA 100 MCG/0.5ML IJ SOSY
100.0000 ug | PREFILLED_SYRINGE | INTRAMUSCULAR | Status: DC
  Filled 2020-08-05: qty 0.5

## 2020-08-05 MED ORDER — LACTATED RINGERS IV SOLN: INTRAVENOUS | Status: DC | PRN

## 2020-08-05 MED ORDER — CHLORHEXIDINE GLUCONATE CLOTH 2 % EX PADS: 6.0000 | MEDICATED_PAD | Freq: Every day | CUTANEOUS | Status: DC

## 2020-08-05 MED ORDER — MUPIROCIN 2 % EX OINT
1.0000 "application " | TOPICAL_OINTMENT | Freq: Two times a day (BID) | CUTANEOUS | Status: AC
  Administered 2020-08-05 – 2020-08-09 (×9): 1 via NASAL
  Filled 2020-08-05 (×3): qty 22

## 2020-08-05 SURGICAL SUPPLY — 25 items

## 2020-08-05 NOTE — Transfer of Care (Signed)
Immediate Anesthesia Transfer of Care Note  Patient: James [name]  Procedure(s) Performed: COLONOSCOPY WITH PROPOFOL (N/A ) ESOPHAGOGASTRODUODENOSCOPY (EGD) WITH PROPOFOL (N/A ) POLYPECTOMY BIOPSY  Patient Location: Endoscopy Unit  Anesthesia Type:MAC  Level of Consciousness: awake, alert , oriented and drowsy  Airway & Oxygen Therapy: Patient Spontanous Breathing and Patient connected to nasal cannula oxygen  Post-op Assessment: Report given to RN, Post -op Vital signs reviewed and stable and Patient moving all extremities X 4  Post vital signs: Reviewed and stable  Last Vitals:  Vitals Value Taken Time  BP    Temp    Pulse    Resp    SpO2      Last Pain:  Vitals:   08/05/20 0909  TempSrc: Oral  PainSc: 0-No pain      Patients Stated Pain Goal: 0 (69/24/93 2419)  Complications: No complications documented.

## 2020-08-05 NOTE — Op Note (Signed)
[name] Patient Name: James [name] Procedure Date : 08/05/2020 MRN: [id number] Attending MD: Milus Banister , MD Date of Birth: May 18, 1971 CSN: [id number] Age: 50 Admit Type: Inpatient Procedure:                Upper GI endoscopy Indications:              Heme positive stool, chronic multifactorial anemia Providers:                Milus Banister, MD, Particia Nearing, RN, Tyna Jaksch Technician Referring MD:              Medicines:                Monitored Anesthesia Care Complications:            No immediate complications. Estimated blood loss:                            None. Estimated Blood Loss:     Estimated blood loss: none. Procedure:                Pre-Anesthesia Assessment:                           - Prior to the procedure, a History and Physical                            was performed, and patient medications and                            allergies were reviewed. The patient's tolerance of                            previous anesthesia was also reviewed. The risks                            and benefits of the procedure and the sedation                            options and risks were discussed with the patient.                            All questions were answered, and informed consent                            was obtained. Prior Anticoagulants: The patient has                            taken Coumadin (warfarin), last dose was 5 days                            prior to procedure. ASA Grade Assessment: IV - A  patient with severe systemic disease that is a                            constant threat to life. After reviewing the risks                            and benefits, the patient was deemed in                            satisfactory condition to undergo the procedure.                           After obtaining informed consent, the endoscope was                            passed under  direct vision. Throughout the                            procedure, the patient's blood pressure, pulse, and                            oxygen saturations were monitored continuously. The                            GIF-H190 ([id number] Olympus gastroscope was                            introduced through the mouth, and advanced to the                            second part of duodenum. The upper GI endoscopy was                            accomplished without difficulty. The patient                            tolerated the procedure well. Scope In: Scope Out: Findings:      Mild inflammation characterized by erythema and friability was found in       the gastric antrum and duodenal bulb. Biopsies were taken with a cold       forceps for histology.      The exam was otherwise without abnormality. Impression:               - Mild, non-specific gastritis and bulbar                            duodenitis. Biopsies taken from the stomach to                            check for H. pylori.                           - The examination was otherwise normal. Recommendation:           - No serious  findings on this examination or on the                            colonoscopy just prior. OK to resume whatever blood                            thinners are needed to treat his cardiovascular                            disease.                           - I will follow up on his polyp biopsies and the                            biopsies from his stomach.                           - I will allow solid food. Please call or page is                            if there is any concern for active gi bleeding. Procedure Code(s):        --- Professional ---                           [phone], Esophagogastroduodenoscopy, flexible,                            transoral; with biopsy, single or multiple Diagnosis Code(s):        --- Professional ---                           K29.70, Gastritis, unspecified, without  bleeding                           R19.5, Other fecal abnormalities CPT copyright 2019 American Medical Association. All rights reserved. The codes documented in this report are preliminary and upon coder review may  be revised to meet current compliance requirements. Milus Banister, MD 08/05/2020 10:30:44 AM This report has been signed electronically. Number of Addenda: 0

## 2020-08-05 NOTE — Evaluation (Signed)
Occupational Therapy Evaluation Patient Details Name: James [name] MRN: [id number] DOB: 1971-06-21 Today's Date: 08/05/2020    History of Present Illness 50 y.o. male presenting with AMS and L chest pain and non-STEMI. Recent hospital admission from 12/26-1/06 with hypertensive emergency and necrotic, gangrenous toes from PVD resulting in L transmet amp. Patient declined inpatient rehab and was d/c'd home. PMHx significant for ESRD s/p renal transplant 2009, failed 2018, HD, DMII, PVD, R BKA 07/2019, prostate cancer 2020, and B-cell lymphoma.   Clinical Impression   PTA patient was living at home alone with PRN assist from a PCA 3x weekly for 2hrs. Mother and sister available to assist as needed. At baseline patient hops household distances with use of RW but has been using a manual wc since transmet amp 06/2020. Evaluation limited by patient desire to return to sleep with patient in agreement with bed mobility only demonstrating supine <> EOB with supervision A +rail and increased time. Patient declined majority of functional assessment. Per clinical judgement and limited functional assessment, patient with deficits in balance and strength requiring external assist for functional transfers and ADL tasks indicating need for continued acute OT services in prep for safe d/c home. Recommendation for HHOT and 24hr supervision/assist.     Follow Up Recommendations  Home health OT;Supervision/Assistance - 24 hour    Equipment Recommendations  None recommended by OT (Patient has necessary DME)    Recommendations for Other Services       Precautions / Restrictions Precautions Precautions: Fall Precaution Comments: LLE NWB Restrictions Weight Bearing Restrictions: Yes LLE Weight Bearing: Non weight bearing      Mobility Bed Mobility Overal bed mobility: Modified Independent;Needs Assistance Bed Mobility: Supine to Sit;Sit to Supine     Supine to sit: Supervision Sit to supine:  Supervision   General bed mobility comments: Supervision A for supine <> EOB. +rail.    Transfers                 General transfer comment: Refused    Balance Overall balance assessment: Needs assistance Sitting-balance support: Bilateral upper extremity supported Sitting balance-Leahy Scale: Fair Sitting balance - Comments: Able to maintain static sitting at EOB without external assist or overt LOB.                                   ADL either performed or assessed with clinical judgement   ADL Overall ADL's : Needs assistance/impaired                                       General ADL Comments: Patient declined all ADL tasks this date.     Vision   Vision Assessment?: No apparent visual deficits     Perception     Praxis      Pertinent Vitals/Pain Pain Assessment: 0-10 Pain Score: 7  Pain Location: LLE Pain Descriptors / Indicators: Sharp;Other (Comment) (Nerve related) Pain Intervention(s): Limited activity within patient's tolerance;Monitored during session     Hand Dominance Right   Extremity/Trunk Assessment Upper Extremity Assessment Upper Extremity Assessment: Generalized weakness   Lower Extremity Assessment Lower Extremity Assessment: Defer to PT evaluation   Cervical / Trunk Assessment Cervical / Trunk Assessment: Normal   Communication Communication Communication: No difficulties   Cognition Arousal/Alertness: Awake/alert Behavior During Therapy: Agitated Overall Cognitive Status: Within Functional Limits  for tasks assessed                                 General Comments: A&Ox4. Responds appropriately to therapists questions.   General Comments  HR 100's at rest.    Exercises     Shoulder Instructions      Home Living Family/patient expects to be discharged to:: Private residence Living Arrangements: Alone Available Help at Discharge: Personal care attendant;Family;Available  PRN/intermittently Type of Home: Apartment Home Access: Ramped entrance     Home Layout: One level     Bathroom Shower/Tub: Teacher, early years/pre: Standard     Home Equipment: Environmental consultant - 2 wheels;Wheelchair - manual;Tub bench;Other (comment);Wheelchair - power;Hospital bed (drop-arm [facility])          Prior Functioning/Environment Level of Independence: Needs assistance  Gait / Transfers Assistance Needed: Wheelchair in home and community dwellings. Prior to transmet amp 07/21/20 patient was hopping household distances with use of RW. ADL's / Homemaking Assistance Needed: Some assist from PCA for bathing/dressing and household management. PCA 3x weekly for 2hr.            OT Problem List: Decreased strength;Decreased safety awareness;Decreased knowledge of use of DME or AE;Pain      OT Treatment/Interventions: Self-care/ADL training;Therapeutic exercise;Energy conservation;DME and/or AE instruction;Therapeutic activities;Patient/family education;Balance training    OT Goals(Current goals can be found in the care plan section) Acute Rehab OT Goals Patient Stated Goal: To get some sleep OT Goal Formulation: With patient Time For Goal Achievement: 08/19/20 Potential to Achieve Goals: Good ADL Goals Pt Will Perform Grooming: with modified independence;sitting Pt Will Perform Upper Body Dressing: with modified independence;sitting Pt Will Perform Lower Body Dressing: with min assist;sitting/lateral leans Pt Will Transfer to Toilet: with modified independence;bedside commode;squat pivot transfer Pt Will Perform Toileting - Clothing Manipulation and hygiene: with modified independence;sitting/lateral leans  OT Frequency: Min 2X/week   Barriers to D/C: Decreased caregiver support  Patient lives alone. PCA 3x weekly.       Co-evaluation              AM-PAC OT "6 Clicks" Daily Activity     Outcome Measure Help from another person eating meals?: None Help from  another person taking care of personal grooming?: A Little Help from another person toileting, which includes using toliet, bedpan, or urinal?: A Little Help from another person bathing (including washing, rinsing, drying)?: A Lot Help from another person to put on and taking off regular upper body clothing?: A Little Help from another person to put on and taking off regular lower body clothing?: A Little 6 Click Score: 18   End of Session Nurse Communication: Mobility status  Activity Tolerance: Treatment limited secondary to agitation Patient left: in bed;with call bell/phone within reach;with bed alarm set  OT Visit Diagnosis: Other abnormalities of gait and mobility (R26.89);Muscle weakness (generalized) (M62.81);Pain Pain - Right/Left: Left Pain - part of body: Ankle and joints of foot                Time: 1400-1426 OT Time Calculation (min): 26 min Charges:  OT General Charges $OT Visit: 1 Visit OT Evaluation $OT Eval Moderate Complexity: 1 Mod OT Treatments $Therapeutic Activity: 8-22 mins  Ion Gonnella H. OTR/L Supplemental OT, Department of rehab services [phone]  Mckyle Solanki R H. 08/05/2020, 3:07 PM

## 2020-08-05 NOTE — Anesthesia Preprocedure Evaluation (Addendum)
Anesthesia Evaluation  Patient identified by MRN, date of birth, ID band Patient awake    Reviewed: Allergy & Precautions, NPO status   Airway Mallampati: II  TM Distance: >3 FB     Dental   Pulmonary pneumonia, Current Smoker and Patient abstained from smoking.,    breath sounds clear to auscultation       Cardiovascular hypertension, + Peripheral Vascular Disease   Rhythm:Regular Rate:Normal     Neuro/Psych    GI/Hepatic Neg liver ROS, GERD  ,  Endo/Other  diabetes  Renal/GU Renal disease     Musculoskeletal   Abdominal   Peds  Hematology  (+) anemia ,   Anesthesia Other Findings   Reproductive/Obstetrics                             Anesthesia Physical Anesthesia Plan  ASA: III  Anesthesia Plan: MAC   Post-op Pain Management:    Induction: Intravenous  PONV Risk Score and Plan: Propofol infusion  Airway Management Planned: Nasal Cannula and Simple Face Mask  Additional Equipment:   Intra-op Plan:   Post-operative Plan:   Informed Consent: I have reviewed the patients History and Physical, chart, labs and discussed the procedure including the risks, benefits and alternatives for the proposed anesthesia with the patient or authorized representative who has indicated his/her understanding and acceptance.     Dental advisory given  Plan Discussed with: CRNA and Anesthesiologist  Anesthesia Plan Comments:         Anesthesia Quick Evaluation

## 2020-08-05 NOTE — Anesthesia Postprocedure Evaluation (Signed)
Anesthesia Post Note  Patient: Luisdavid Hamblin Lucente  Procedure(s) Performed: COLONOSCOPY WITH PROPOFOL (N/A ) ESOPHAGOGASTRODUODENOSCOPY (EGD) WITH PROPOFOL (N/A ) POLYPECTOMY BIOPSY     Patient location during evaluation: Endoscopy Anesthesia Type: MAC Level of consciousness: awake Pain management: pain level controlled Vital Signs Assessment: post-procedure vital signs reviewed and stable Respiratory status: spontaneous breathing Cardiovascular status: stable Postop Assessment: no apparent nausea or vomiting Anesthetic complications: no   No complications documented.  Last Vitals:  Vitals:   08/05/20 1043 08/05/20 1102  BP: 136/79 (!) 142/83  Pulse: (!) 122 86  Resp: (!) 24   Temp:  36.6 C  SpO2: 95% 99%    Last Pain:  Vitals:   08/05/20 1102  TempSrc: Oral  PainSc:                  CHARLENE GREEN

## 2020-08-05 NOTE — Anesthesia Procedure Notes (Signed)
Procedure Name: MAC Date/Time: 08/05/2020 9:45 AM Performed by: Harden Mo, CRNA Pre-anesthesia Checklist: Suction available, Patient identified, Emergency Drugs available and Patient being monitored Patient Re-evaluated:Patient Re-evaluated prior to induction Oxygen Delivery Method: Nasal cannula Preoxygenation: Pre-oxygenation with 100% oxygen Induction Type: IV induction Placement Confirmation: positive ETCO2 and breath sounds checked- equal and bilateral Dental Injury: Teeth and Oropharynx as per pre-operative assessment

## 2020-08-05 NOTE — Progress Notes (Signed)
[facility] Progress Note    Assessment/ Plan:   ESRD:  -h/o failed txp -outpatient orders [facility]: F200, 3.5HRS, 350/700. 2K/2.5Ca. Heparin 1k units bolus -moving HD to tomorrow 1/15 given cscope and egd today (high patient census in dialysis today), resume MWF thereafter. From a volume and labs standpoint, okay to wait until tomorrow -no heparin here -continue with home pred and tacrolimus  NSTEMI -appreciate cardiology -ischemic workup on hold given possible infection and blood loss anemia  New CHF -ef now 35-40%, cardiology on board. UF as tolerated  Tachyarrhythmia -rapid response called in dialysis unit 1/12, held further UF, improved with valsalva. Looked like SVT initially on tele w/ HR up to the 180s, sinus tach on ekg. Did  -needs to get BB prior to HD  Volume/ hypertension/pulmonary edema: -edw 69kg, UF as tolerated. Resume home anti-htns. Resp status better  Anemia secondary to Chronic Kidney Disease and blood loss: -aranesp 2mcg every Wed outpatient. Increased aranesp to 170mcg, dose to be given monday -venofer once every 2 weeks. Iron panel ordered, holding on starting iron given infection eval -1u prbc on 1/11 -stool occult positive, GI on board, cscope and egd 1/14 -macrocytic, b12 high, folate ok -transfuse prn for hgb <7  Secondary Hyperparathyroidism/Hyperphosphatemia: resume home binders (on renvela), monitor phos and PTH  -hectorol 4.38mcg qtreatment  AMS/encephalopathy -persistent despite HD, possibly another etiology rather than uremic encephalopathy. Mgmt per primary service  Vascular access: rue avf +b/t  # Additional recommendations: - Dose all meds for creatinine clearance <10 ml/min  - Unless absolutely necessary, no MRIs with gadolinium.  - Implement save arm precautions. Prefer needle sticks in the dorsum of the hands or wrists. No blood pressure measurements in arm. - If blood transfusion is requested  during hemodialysis sessions, please alert [place] prior to the session.  - If a hemodialysis catheter line culture is requested, please alert [place] as only hemodialysis nurses are able to collect those specimens.   Gean Quint, MD [facility]  Subjective:   Seen and examined earlier this AM. No acute events. Slightly confused in the am. HR stable.   Objective:   BP (!) 141/86 (BP Location: Left Arm)   Pulse 88   Temp 98 F (36.7 C) (Oral)   Resp 17   Wt 63.7 kg   SpO2 100%   BMI 21.99 kg/m   Intake/Output Summary (Last 24 hours) at 08/05/2020 0804 Last data filed at 08/05/2020 0319 Gross per 24 hour  Intake 945.03 ml  Output --  Net 945.03 ml   Weight change:   Physical Exam: Gen:nad CVS:reg rate Resp: unlabored, no iwob KWI:OXBD, nt/nd Ext:no edema Neuro: speech clear and coherent, moves all ext spontaneously, alert and oriented Access: rue avf +b/t  Imaging: ECHOCARDIOGRAM LIMITED  Result Date: 08/03/2020    ECHOCARDIOGRAM LIMITED REPORT   Patient Name:   James [name] Date of Exam: 08/03/2020 Medical Rec #:  [id number]       Height:       67.0 in Accession #:    [id number]      Weight:       147.9 lb Date of Birth:  08-30-1970       BSA:          1.779 m Patient Age:    50 years        BP:           155/91 mmHg Patient Gender: M  HR:           129 bpm. Exam Location:  Inpatient Procedure: Limited Echo and Limited Color Doppler Indications:    Chest Pain R07.9  History:        Patient has prior history of Echocardiogram examinations, most                 recent 08/15/2015. PAD; Risk Factors:Hypertension, Current Smoker                 and Diabetes. GERD. End stage renal disease. Anemia. Prolonged                 QT Interval. History of cancer. Thrombocytopenia.  Sonographer:    Darlina Sicilian RDCS Referring Phys: 28 Dorris Comments: Image acquisition challenging due to uncooperative patient. IMPRESSIONS  1. EF estimate limited as only  para sternal images obtained no apical or sub costal images done due to patient pain and lack of cooperation . Left ventricular ejection fraction, by estimation, is 35 to 40%. The left ventricle has moderately decreased function. The left ventricle demonstrates global hypokinesis. The left ventricular internal cavity size was moderately dilated. There is moderate left ventricular hypertrophy.  2. Left atrial size was moderately dilated.  3. The mitral valve is abnormal. Mild mitral valve regurgitation. Moderate mitral annular calcification.  4. Calcified non coronary cusp. The aortic valve was not well visualized. Mild to moderate aortic valve sclerosis/calcification is present, without any evidence of aortic stenosis. FINDINGS  Left Ventricle: EF estimate limited as only para sternal images obtained no apical or sub costal images done due to patient pain and lack of cooperation. Left ventricular ejection fraction, by estimation, is 35 to 40%. The left ventricle has moderately decreased function. The left ventricle demonstrates global hypokinesis. The left ventricular internal cavity size was moderately dilated. There is moderate left ventricular hypertrophy. Left Atrium: Left atrial size was moderately dilated. Mitral Valve: The mitral valve is abnormal. There is mild thickening of the mitral valve leaflet(s). There is mild calcification of the mitral valve leaflet(s). Moderate mitral annular calcification. Mild mitral valve regurgitation. Aortic Valve: Calcified non coronary cusp. The aortic valve was not well visualized. Mild to moderate aortic valve sclerosis/calcification is present, without any evidence of aortic stenosis. LEFT VENTRICLE PLAX 2D LVIDd:         5.00 cm LVIDs:         4.10 cm LV PW:         1.30 cm LV IVS:        1.50 cm  LEFT ATRIUM         Index LA diam:    4.30 cm 2.42 cm/m Jenkins Rouge MD Electronically signed by Jenkins Rouge MD Signature Date/Time: 08/03/2020/4:01:32 PM    Final      Labs: BMET Recent Labs  Lab 08/01/20 1020 08/01/20 1026 08/02/20 0628 08/03/20 0249 08/03/20 1248 08/04/20 0249 08/05/20 0225  NA 134* 136  135 135 135 136 139 138  K 6.0* 5.8*  5.9* 5.3* 4.2 2.9* 4.2 3.9  CL 95* 100 95* 92* 95* 97* 96*  CO2 18*  --  20* 25 27 28 23   GLUCOSE 82 87 81 85 87 86 69*  BUN 69* 66* 82* 41* 10 17 31*  CREATININE 12.15* 12.20* 14.21* 8.80* 2.94* 5.33* 7.99*  CALCIUM 9.8  --  9.4 9.5 9.2 9.6 10.0  PHOS  --   --  5.7* 4.5 1.9*  --   --  CBC Recent Labs  Lab 08/03/20 0249 08/03/20 1248 08/04/20 0249 08/05/20 0225  WBC 10.3 9.1 8.2 8.8  HGB 8.3* 7.8* 7.4* 7.5*  HCT 24.7* 23.9* 22.7* 24.3*  MCV 104.7* 106.2* 108.1* 111.0*  PLT 205 178 160 169    Medications:    . sodium chloride   Intravenous Once  . amLODipine  10 mg Oral Daily  . Chlorhexidine Gluconate Cloth  6 each Topical Q0600  . darbepoetin (ARANESP) injection - DIALYSIS  100 mcg Intravenous Q Fri-HD  . insulin aspart  0-6 Units Subcutaneous TID WC  . lidocaine  1 patch Transdermal Q24H  . metoprolol tartrate  50 mg Oral Q6H  . predniSONE  5 mg Oral Q breakfast  . tacrolimus  0.5 mg Oral Daily      Gean Quint, MD [facility] 08/05/2020, 8:04 AM

## 2020-08-05 NOTE — Op Note (Signed)
[facility] Patient Name: James [name] Procedure Date : 08/05/2020 MRN: [id number] Attending MD: Milus Banister , MD Date of Birth: January 31, 1971 CSN: [id number] Age: 50 Admit Type: Inpatient Procedure:                Colonoscopy Indications:              Heme positive stool, multifactorial anemia, h/o                            single subCM polyp 03/2019 colonsocopy Dr. Watt Climes Providers:                Milus Banister, MD, Particia Nearing, RN, Tyna Jaksch Technician Referring MD:              Medicines:                Monitored Anesthesia Care Complications:            No immediate complications. Estimated blood loss:                            None. Estimated Blood Loss:     Estimated blood loss: none. Procedure:                Pre-Anesthesia Assessment:                           - Prior to the procedure, a History and Physical                            was performed, and patient medications and                            allergies were reviewed. The patient's tolerance of                            previous anesthesia was also reviewed. The risks                            and benefits of the procedure and the sedation                            options and risks were discussed with the patient.                            All questions were answered, and informed consent                            was obtained. Prior Anticoagulants: The patient has                            taken Coumadin (warfarin), last dose was 5 days  prior to procedure. ASA Grade Assessment: IV - A                            patient with severe systemic disease that is a                            constant threat to life. After reviewing the risks                            and benefits, the patient was deemed in                            satisfactory condition to undergo the procedure.                           After obtaining informed  consent, the colonoscope                            was passed under direct vision. Throughout the                            procedure, the patient's blood pressure, pulse, and                            oxygen saturations were monitored continuously. The                            CF-HQ190L ([id number]) Olympus colonoscope was                            introduced through the anus and advanced to the the                            cecum, identified by appendiceal orifice and                            ileocecal valve. The colonoscopy was performed                            without difficulty. The patient tolerated the                            procedure well. The quality of the bowel                            preparation was good. The ileocecal valve,                            appendiceal orifice, and rectum were photographed. Scope In: 9:54:29 AM Scope Out: 10:12:00 AM Total Procedure Duration: 0 hours 17 minutes 31 seconds  Findings:      A 4 mm polyp was found in the descending colon. The polyp was sessile.       The polyp was removed with a cold snare. Resection  and retrieval were       complete.      Multiple small and large-mouthed diverticula were found in the left       colon.      External and internal hemorrhoids were found. The hemorrhoids were small.      The exam was otherwise without abnormality on direct and retroflexion       views. Impression:               - One 4 mm polyp in the descending colon, removed                            with a cold snare. Resected and retrieved.                           - Diverticulosis in the left colon.                           - External and internal hemorrhoids.                           - The examination was otherwise normal on direct                            and retroflexion views. Recommendation:           - Await pathology results.                           - EGD now. Procedure Code(s):        --- Professional ---                            [phone], Colonoscopy, flexible; with removal of                            tumor(s), polyp(s), or other lesion(s) by snare                            technique Diagnosis Code(s):        --- Professional ---                           K63.5, Polyp of colon                           K64.8, Other hemorrhoids                           R19.5, Other fecal abnormalities                           K57.30, Diverticulosis of large intestine without                            perforation or abscess without bleeding CPT copyright 2019 [employer]. All rights reserved. The codes documented in this report are preliminary and upon coder review may  be revised to meet current compliance  requirements. Milus Banister, MD 08/05/2020 10:15:37 AM This report has been signed electronically. Number of Addenda: 0

## 2020-08-05 NOTE — Progress Notes (Addendum)
Progress Note  Patient Name: James [name] Date of Encounter: 08/05/2020  Primary Cardiologist: Mertie Moores, MD  Subjective   No CP or SOB. Had EGD/colonoscopy earlier, results as below. He mentioned to family member that he still felt somewhat confused at times. He is currently A+Ox3.   Inpatient Medications    Scheduled Meds: . sodium chloride   Intravenous Once  . amLODipine  10 mg Oral Daily  . Chlorhexidine Gluconate Cloth  6 each Topical Q0600  . [START ON 08/08/2020] darbepoetin (ARANESP) injection - DIALYSIS  100 mcg Intravenous Q Mon-HD  . insulin aspart  0-6 Units Subcutaneous TID WC  . lidocaine  1 patch Transdermal Q24H  . metoprolol tartrate  50 mg Oral Q6H  . predniSONE  5 mg Oral Q breakfast  . tacrolimus  0.5 mg Oral Daily   Continuous Infusions: . cefTRIAXone (ROCEPHIN)  IV 2 g (08/04/20 1623)  . vancomycin     PRN Meds: acetaminophen **OR** acetaminophen, oxyCODONE, polyethylene glycol   Vital Signs    Vitals:   08/05/20 0909 08/05/20 1034 08/05/20 1043 08/05/20 1102  BP: (!) 170/85 136/73 136/79 (!) 142/83  Pulse: 94 89 (!) 122 86  Resp: 18 14 (!) 24   Temp: 98.1 F (36.7 C) 97.9 F (36.6 C)  97.8 F (36.6 C)  TempSrc: Oral Oral  Oral  SpO2: 100% 100% 95% 99%  Weight:        Intake/Output Summary (Last 24 hours) at 08/05/2020 1115 Last data filed at 08/05/2020 0319 Gross per 24 hour  Intake 945.03 ml  Output -  Net 945.03 ml   Last 3 Weights 08/04/2020 08/03/2020 08/02/2020  Weight (lbs) 140 lb 6.9 oz 138 lb 7.2 oz (No Data)  Weight (kg) 63.7 kg 62.8 kg (No Data)     Telemetry    NSR, brief run of SVT vs NSVT - Personally Reviewed  Physical Exam   GEN: No acute distress.  HEENT: Normocephalic, atraumatic, sclera non-icteric. Neck: No JVD or bruits. Cardiac: RRR no murmurs, rubs, or gallops.  Respiratory: Clear to auscultation bilaterally. Breathing is unlabored. GI: Soft, nontender, non-distended, BS +x 4. MS: no  deformity. Extremities:  S/p remote R BKA and recent L TMA without acute trauma Neuro:  AAOx3. Follows commands. Talking on phone when I arrived. Psych:  Responds to questions appropriately with a normal affect.  Labs    High Sensitivity Troponin:   Recent Labs  Lab 08/01/20 1020 08/01/20 1555 08/01/20 1828  TROPONINIHS 1,049* 1,925* 1,964*      Cardiac EnzymesNo results for input(s): TROPONINI in the last 168 hours. No results for input(s): TROPIPOC in the last 168 hours.   Chemistry Recent Labs  Lab 08/02/20 0628 08/03/20 0249 08/03/20 1248 08/04/20 0249 08/05/20 0225  NA 135   < > 136 139 138  K 5.3*   < > 2.9* 4.2 3.9  CL 95*   < > 95* 97* 96*  CO2 20*   < > 27 28 23   GLUCOSE 81   < > 87 86 69*  BUN 82*   < > 10 17 31*  CREATININE 14.21*   < > 2.94* 5.33* 7.99*  CALCIUM 9.4   < > 9.2 9.6 10.0  PROT 6.6  --   --  7.5  --   ALBUMIN 2.4*  2.4*  --  2.5* 2.3*  --   AST 38  --   --  38  --   ALT 35  --   --  36  --   ALKPHOS 209*  --   --  182*  --   BILITOT 0.7  --   --  0.7  --   GFRNONAA 4*   < > 26* 13* 8*  ANIONGAP 20*   < > 14 14 19*   < > = values in this interval not displayed.     Hematology Recent Labs  Lab 08/03/20 1248 08/04/20 0249 08/05/20 0225  WBC 9.1 8.2 8.8  RBC 2.25* 2.10* 2.19*  HGB 7.8* 7.4* 7.5*  HCT 23.9* 22.7* 24.3*  MCV 106.2* 108.1* 111.0*  MCH 34.7* 35.2* 34.2*  MCHC 32.6 32.6 30.9  RDW 16.0* 15.9* 15.7*  PLT 178 160 169    BNPNo results for input(s): BNP, PROBNP in the last 168 hours.   DDimer No results for input(s): DDIMER in the last 168 hours.   Radiology    ECHOCARDIOGRAM LIMITED  Result Date: 08/03/2020    ECHOCARDIOGRAM LIMITED REPORT   Patient Name:   James [name] Date of Exam: 08/03/2020 Medical Rec #:  [id number]       Height:       67.0 in Accession #:    [id number]      Weight:       147.9 lb Date of Birth:  1970/10/10       BSA:          1.779 m Patient Age:    50 years        BP:           155/91 mmHg  Patient Gender: M               HR:           129 bpm. Exam Location:  Inpatient Procedure: Limited Echo and Limited Color Doppler Indications:    Chest Pain R07.9  History:        Patient has prior history of Echocardiogram examinations, most                 recent 08/15/2015. PAD; Risk Factors:Hypertension, Current Smoker                 and Diabetes. GERD. End stage renal disease. Anemia. Prolonged                 QT Interval. History of cancer. Thrombocytopenia.  Sonographer:    Darlina Sicilian RDCS Referring Phys: [address] [place] Comments: Image acquisition challenging due to uncooperative patient. IMPRESSIONS  1. EF estimate limited as only para sternal images obtained no apical or sub costal images done due to patient pain and lack of cooperation . Left ventricular ejection fraction, by estimation, is 35 to 40%. The left ventricle has moderately decreased function. The left ventricle demonstrates global hypokinesis. The left ventricular internal cavity size was moderately dilated. There is moderate left ventricular hypertrophy.  2. Left atrial size was moderately dilated.  3. The mitral valve is abnormal. Mild mitral valve regurgitation. Moderate mitral annular calcification.  4. Calcified non coronary cusp. The aortic valve was not well visualized. Mild to moderate aortic valve sclerosis/calcification is present, without any evidence of aortic stenosis. FINDINGS  Left Ventricle: EF estimate limited as only para sternal images obtained no apical or sub costal images done due to patient pain and lack of cooperation. Left ventricular ejection fraction, by estimation, is 35 to 40%. The left ventricle has moderately decreased function. The left ventricle demonstrates global hypokinesis. The left ventricular internal  cavity size was moderately dilated. There is moderate left ventricular hypertrophy. Left Atrium: Left atrial size was moderately dilated. Mitral Valve: The mitral valve is abnormal. There  is mild thickening of the mitral valve leaflet(s). There is mild calcification of the mitral valve leaflet(s). Moderate mitral annular calcification. Mild mitral valve regurgitation. Aortic Valve: Calcified non coronary cusp. The aortic valve was not well visualized. Mild to moderate aortic valve sclerosis/calcification is present, without any evidence of aortic stenosis. LEFT VENTRICLE PLAX 2D LVIDd:         5.00 cm LVIDs:         4.10 cm LV PW:         1.30 cm LV IVS:        1.50 cm  LEFT ATRIUM         Index LA diam:    4.30 cm 2.42 cm/m Jenkins Rouge MD Electronically signed by Jenkins Rouge MD Signature Date/Time: 08/03/2020/4:01:32 PM    Final     Cardiac Studies   2D echo 08/03/20   1. EF estimate limited as only para sternal images obtained no apical or  sub costal images done due to patient pain and lack of cooperation . Left  ventricular ejection fraction, by estimation, is 35 to 40%. The left  ventricle has moderately decreased  function. The left ventricle demonstrates global hypokinesis. The left  ventricular internal cavity size was moderately dilated. There is moderate  left ventricular hypertrophy.  2. Left atrial size was moderately dilated.  3. The mitral valve is abnormal. Mild mitral valve regurgitation.  Moderate mitral annular calcification.  4. Calcified non coronary cusp. The aortic valve was not well visualized.  Mild to moderate aortic valve sclerosis/calcification is present, without  any evidence of aortic stenosis.   Patient Profile     50 y.o. 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 Complex PMH as outlined in consult note. Recently in the hospital for ischemic foot s/p L TMA. He presented to the hospital 08/01/2020 after having a fall and being found confused by his aide that arrived, also with some recollection of having had chest pain. He had been confirmed compliant with last scheduled HD session on Friday. He was hypertensive and tachycardic (sinus) on arrival. He required BiPAP on arrival and quickly improved with NTG paste and BiPAP. CT angio ruled out dissection. Cardiology asked to see for elevated troponin. Other issues this admission include worsening anemia + FOBT, waxing/waning encephalopathy and fever. 2D Echo demonstrates new LV dysfunction.  Assessment & Plan    1. Acute encephalopathy with acute respiratory distress and pulmonary edema requiring BiPAP, also leukocytosis + fever during admission - etiology of initial event unclear, patient wonders if he took too many narcotics unintentionally for his recent amputation - infective management per primary team - MR LLE pending to exclude osteomyelitis  2. Chest pain with suspected NSTEMI - also new LV dysfunction/suspected acute systolic CHF - does not account for entire presentation with AMS / fever - hsTroponin 2637 - 1925 - 1964 which seems higher than usual demand ischemia process - CT angio without evidence for dissection, see above for other findings - 2D Echo 08/03/20 with EF 35-40%,  made challenging by uncooperative patient, moderate LAE, moderate LV dilation, mild MR - given elevated troponin and LV dysfunction would benefit from ischemic evaluation but timing is suboptimal given ongoing infection issues, waxing/waning encephalopathy and anemia issues - will discuss further recs with MD - consider trial of aspirin and resumption of heparin to see if he is even able to tolerate this (has been held since adm due to anemia/GIB) - consider consolidating Lopressor to Toprol  soon - avoid ACEi/ARB/ARNI/spiro due to ESRD and hyperkalemia - volume management per HD - can consider addition of low dose nitrates/hydralazine for systolic dysfunction  3. Sinus tachycardia - baseline problem for patient, exacerbated while inpatient due to holding of BB and anemia - improved  4. Acute on chronic anemia/heme positive stool - Hgb drop from 8-9 range to 6.7 earlier this admission s/p transfusion, maintaining mid 7's - primary team and GI managing - colonoscopy this AM With colon polyp, internal/external hemorrhoids, EGD with mild nonspecific gastritis and bulbar duodenitis - cleared to resume blood thinners from their standpoint  5. ESRD on hemodialysis, also with hyperkalemia - per nephrology - would avoid ACEi going forward due to K  6. PVD s/p R BKA 07/2019, L TMA 06/2020 - per IM - on ASA, held by primary team for now, consider resuming when felt stable  7. Splenic infarct, on Coumadin prior to admission - INR therapeutic on admission - see #4 - consider resumption of heparin to see how he tolerates this  8. Prolonged QT interval on EKG - avoid additional QT prolonging agents - QTc 471ms now by teleemtry   For questions or updates, please contact [place] Please consult [email or web address] for contact info under Cardiology/STEMI.  Signed, Charlie Pitter, PA-C 08/05/2020, 11:15 AM    Attending Note:   The patient was seen and examined.  Agree with assessment and plan as noted above.  Changes made to the above note as needed.  Patient seen and independently examined with  Melina Copa, PA .   We discussed all aspects of the encounter. I agree with the assessment and plan as stated above.  1.    NSTEMI:   He has improved mentally and I think is ready to have a cath.   We need to make sure he does not need any additional procedures in the near future.   There is some concern about osteomyeolitis in his foot.   The medical team is planning on doing a MRI  to    R/o osteo.   2.     I have spent a total of 40 minutes with patient reviewing hospital  notes , telemetry, EKGs, labs and examining patient as well as establishing an assessment and plan that was discussed with the patient. > 50% of time was spent in direct patient care.    Thayer Headings, Brooke Bonito., MD, [facility] 08/05/2020, 3:21 PM [address],  [place] Pager [phone]

## 2020-08-05 NOTE — Evaluation (Signed)
PT Cancellation Note  Patient Details Name: James [name] MRN: [id number] DOB: [birth year]   Cancelled Treatment:    Reason Eval/Treat Not Completed: Patient at procedure or test/unavailable Pt currently off the floor for procedure. Will follow up as schedule allows.   Lou Miner, DPT  Acute Rehabilitation Services  Pager: [phone] Office: [phone]    Rudean Hitt 08/05/2020, 9:33 AM

## 2020-08-05 NOTE — Interval H&P Note (Signed)
History and Physical Interval Note:  08/05/2020 9:36 AM  James [name]  has presented today for surgery, with the diagnosis of Anemia, FOBT positive.  The various methods of treatment have been discussed with the patient and family. After consideration of risks, benefits and other options for treatment, the patient has consented to  Procedure(s): COLONOSCOPY WITH PROPOFOL (N/A) ESOPHAGOGASTRODUODENOSCOPY (EGD) WITH PROPOFOL (N/A) as a surgical intervention.  The patient's history has been reviewed, patient examined, no change in status, stable for surgery.  I have reviewed the patient's chart and labs.  Questions were answered to the patient's satisfaction.     Milus Banister

## 2020-08-05 NOTE — Progress Notes (Addendum)
We preliminarily put this patient on the cath schedule for Monday but IM alerted [place] that MRI of his foot is compatible with osteo and new fluid collection, possible abscess vs phlegmon. Per Dr. Acie Fredrickson, plan to cancel cath and reschedule when the infection issues have been addressed / treated.  Isabelly Kobler PA-C

## 2020-08-05 NOTE — Progress Notes (Signed)
HD#4 Subjective:   Overnight Events: No acute events overnight. Tmax 98.8. Receiving scheduled Tylenol.  Patient seen at bedside this AM. He states that after the procedure went well and that he was told that only one polyp was removed. He notes worsening of lower extremity pain since waking from GI procedures today. He has no other complaints or concerns at this time.   Objective:  Vital signs in last 24 hours: Vitals:   08/04/20 1618 08/04/20 2051 08/05/20 0020 08/05/20 0350  BP: 122/84 (!) 157/71 (!) 143/87 (!) 160/86  Pulse: 98 94 92 92  Resp:  18 18 18   Temp:  98.3 F (36.8 C) 98.3 F (36.8 C) 98 F (36.7 C)  TempSrc:  Oral Oral Oral  SpO2:  100% 100% 100%  Weight:       Supplemental O2: Room Air  Physical Exam:   Physical Exam Vitals and nursing note reviewed.  Constitutional:      General: He is not in acute distress.    Appearance: Normal appearance. He is not ill-appearing.  HENT:     Head: Normocephalic and atraumatic.  Cardiovascular:     Rate and Rhythm: Normal rate and regular rhythm.     Pulses: Normal pulses.     Heart sounds: Normal heart sounds. No murmur heard. No gallop.   Pulmonary:     Effort: Pulmonary effort is normal. No respiratory distress.     Breath sounds: Normal breath sounds. No stridor. No wheezing, rhonchi or rales.  Abdominal:     General: Abdomen is flat. Bowel sounds are normal.     Tenderness: There is no abdominal tenderness.  Musculoskeletal:     Comments: LLE bandage in place  Skin:    General: Skin is warm and dry.  Neurological:     Mental Status: He is alert.     Filed Weights   08/03/20 1332 08/04/20 0434  Weight: 62.8 kg 63.7 kg     Intake/Output Summary (Last 24 hours) at 08/05/2020 0643 Last data filed at 08/05/2020 0319 Gross per 24 hour  Intake 1185.03 ml  Output --  Net 1185.03 ml   Net IO Since Admission: -2,731.97 mL [08/05/20 0643]  Pertinent Labs: CBC Latest Ref Rng & Units 08/05/2020  08/04/2020 08/03/2020  WBC 4.0 - 10.5 K/uL 8.8 8.2 9.1  Hemoglobin 13.0 - 17.0 g/dL 7.5(L) 7.4(L) 7.8(L)  Hematocrit 39.0 - 52.0 % 24.3(L) 22.7(L) 23.9(L)  Platelets 150 - 400 K/uL 169 160 178    CMP Latest Ref Rng & Units 08/05/2020 08/04/2020 08/03/2020  Glucose 70 - 99 mg/dL 69(L) 86 87  BUN 6 - 20 mg/dL 31(H) 17 10  Creatinine 0.61 - 1.24 mg/dL 7.99(H) 5.33(H) 2.94(H)  Sodium 135 - 145 mmol/L 138 139 136  Potassium 3.5 - 5.1 mmol/L 3.9 4.2 2.9(L)  Chloride 98 - 111 mmol/L 96(L) 97(L) 95(L)  CO2 22 - 32 mmol/L 23 28 27   Calcium 8.9 - 10.3 mg/dL 10.0 9.6 9.2  Total Protein 6.5 - 8.1 g/dL - 7.5 -  Total Bilirubin 0.3 - 1.2 mg/dL - 0.7 -  Alkaline Phos 38 - 126 U/L - 182(H) -  AST 15 - 41 U/L - 38 -  ALT 0 - 44 U/L - 36 -    Imaging: No results found.  Assessment/Plan:   Active Problems:   Tachycardia   End stage renal disease (HCC)   Anemia   Renal transplant recipient   Respiratory failure [facility])   Patient Summary:  James [name] is a 50 y.o. with pertinent PMH of  s/p failed transplant on ESRD HD MWF followed by [facility] Ne[facility]rology, lymphoma 2013, HTN, BPH, prostate cancer, T2DM, PVD s/p right BKA and left TMA  who presented with shortness of breath and altered mental status and admitted for acute hypoxic respiratory failure, troponemia, and electrolyte abnormalities.  #Acute Encephalopathy #Anion Gap Metabolic Acidosis Patient's confusion seems to have resolved at this time, he seems to remembering days prior to his admission. Patient to have dialysis off schedule tomorrow due to GI procedures today, suspect this is causing his abnormal anion gap. Low suspicion this is secondary to infectious process.  -continue to monitor mental status -dialysis per nephro.  -daily bmp  #Leukocytosis #Fever  WBC of 8.8 this am. No fever over past 24 hrs, however, does have PRN tylenol for pain. BC negative thus far, will continue with ceftriaxone and vanc in setting of recent  surgery. MRSA nasal swab positive. Pending LLE MRI.  -continue Vancomycin and Ceftriaxone, MRSA nasal positive -obtain MRI WO Contrast of LLE -BC negative thus far -continue to monitor, daily cbc  #Left Transmetatarsal Transection 06/2020 #Right BKA 07/2019 #PVD Pending LLE MRI at this time. If concerning for infectious process, will consider consulting orthopedics. -MRI LLE WO Contrast pending -wound care eval - daily dressing changes, apply xeroform gauze. -consider SNF placement at discharge -follow up with surgeons once discharge for suture removal -PT/OT consult in place  #Acute Hypoxic Respiratory Failure - Resolved #Chest Pain Saturating well on room air, currently denies shortness of breath. Denies new episodes of chest pain.  -continue BiPAP - O2 goal of 90-94% -CTA chest negative for aortic dissection -dialysis per nephro  #Anemia Morning Hgb of 7.5 from 7.4 this am. +FOBT Today colonoscopy/endoscopy, negative for acute bleed.  -no acute bleed per colonoscopy/endoscopy -daily cbc -consider starting Protonix daily -transfuse hgb under 7.0 -hold anticoagulants  #ESRD on Dialysis MWF #Hx of Failed Kidney Transplant 2018 #Hyperkalemia Dialysis tomorrow. Off schedule due to GI procedures today. Will trend electrolytes -continue prednisone and tacrolimus in context of renal transplant -per nephro  #Troponemia Elevated troponin's upon admission. Continue to suspect secondary to demand ischemia in setting of uncontrolled HTN.  -cards to proceed with catheterization, planned for 1/17 (Monday)  #New Systolic HF  EF 33-38%, LV moderately decreased function. Global hypokinesis. Appears euvolemic on exam today. Cardiology to perform cardiac cath -continue further workup, further cardiology intervention per above  #Sinus Tachycardia #QT Prolongation Hx of sinus tachycardia, resumed home metoprolol 50 mg q6h. HR has improved since restarting.  -continue to monitor on  tele -avoid qtc prolonging medications  #Hx of HTN Home medications of amlodipine, lisinopril.  -continue home amlodipine, metoprolol -holding home lisinopril in setting hyperkalemia  #Splenic Infarct Holding home warfarin at this time. Pending further cards recommendations.  Diet: Renal IVF: None,None VTE: SCDs Code: Full  Dispo: Discharge to home or SNF pending further workup  Sanjuana Letters DO Internal Medicine Resident PGY-1 Pager [phone] Please contact the on call pager after 5 pm and on weekends at [phone].

## 2020-08-05 NOTE — TOC Initial Note (Signed)
Transition of Care [facility]) - Initial/Assessment Note    Patient Details  Name: James [name] MRN: [id number] Date of Birth: 09-01-1970  Transition of Care [facility]) CM/SW Contact:    Bethena Roys, RN Phone Number: 08/05/2020, 5:59 PM  Clinical Narrative: Patient presented for shortness of breath and confusion. Prior to arrival patient was from home alone. Patient was previously active with [facility] and the agency is willing to service the patient for Registered Nurse, Physical Therapy, and Occupational Therapy. Start of care to begin within 24-48 hours post transition home. Case Manager wanted to speak with patient; nursing staff stated the patient was resting at the time. Pt will need HH orders RN, PT, OT and F2F. Case Manager will continue to follow for additional transition of care needs.                    Expected Discharge Plan: [place] Barriers to Discharge: Continued Medical Work up   Patient Goals and CMS Choice     Choice offered to / list presented to : NA (currently active with [facility])  Expected Discharge Plan and Services Expected Discharge Plan: [place] In-house Referral: NA Discharge Planning Services: CM Consult Post Acute Care Choice: Home Health,Resumption of Svcs/PTA Provider Living arrangements for the past 2 months: Apartment                  HH Arranged: RN,Disease Management,PT,OT HH Agency: [place] Date [facility] Agency Contacted: 08/05/20 Time HH Agency Contacted: 45 Representative spoke with at [place]: Tommi Rumps  Prior Living Arrangements/Services Living arrangements for the past 2 months: Apartment Lives with:: Self Patient language and need for interpreter reviewed:: Yes Do you feel safe going back to the place where you live?: Yes            Criminal Activity/Legal Involvement Pertinent to Current Situation/Hospitalization: No - Comment as needed  Activities of Daily Living Home  Assistive Devices/Equipment: Cytogeneticist (specify type),Bedside commode/3-in-1,Shower chair with back ADL Screening (condition at time of admission) Patient's cognitive ability adequate to safely complete daily activities?: Yes Is the patient deaf or have difficulty hearing?: No Does the patient have difficulty seeing, even when wearing glasses/contacts?: No Does the patient have difficulty concentrating, remembering, or making decisions?: No Patient able to express need for assistance with ADLs?: Yes Does the patient have difficulty dressing or bathing?: Yes Independently performs ADLs?: Yes (appropriate for developmental age) Does the patient have difficulty walking or climbing stairs?: Yes Weakness of Legs: Both Weakness of Arms/Hands: Both  Permission Sought/Granted Permission sought to share information with : Case Manager       Permission granted to share info w AGENCY: [facility]        Emotional Assessment Appearance:: Appears stated age         Psych Involvement: No (comment)  Admission diagnosis:  Respiratory failure ([place]) [J96.90] Hyperkalemia [E87.5] Acute pulmonary edema ([place]) [J81.0] Elevated troponin [R77.8] Hypertensive urgency [I16.0] Patient Active Problem List   Diagnosis Date Noted  . Respiratory failure ([place]) 08/01/2020  . Prolonged QT interval   . End stage renal disease ([place]) 02/18/2020  . Spontaneous bacterial peritonitis ([place]) 10/28/2019  . 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 Hypotension 03/27/2019  . Anemia   . Anemia in chronic renal disease 04/17/2016  . Renal transplant disorder   . Diarrhea 08/13/2015  . Tachycardia 04/09/2014  . ESRD on peritoneal dialysis ([place])   . Diabetes mellitus without  complication ([place])   . Hypertension    PCP:  [name], MD Pharmacy:   [facility] [place], [address] [place] [address] Phone: [phone]  Fax: [phone]   Readmission Risk Interventions No flowsheet data found.

## 2020-08-05 NOTE — Progress Notes (Signed)
OT Cancellation Note  Patient Details Name: James [name] MRN: [id number] DOB: 23-Apr-1971   Cancelled Treatment:    Reason Eval/Treat Not Completed: Patient at procedure or test/ unavailable. OT to check back as time allows.   Gloris Manchester OTR/L Supplemental OT, Department of rehab services [phone]  James Vowels R H. 08/05/2020, 10:19 AM

## 2020-08-06 DIAGNOSIS — I214 Non-ST elevation (NSTEMI) myocardial infarction: Secondary | ICD-10-CM | POA: Diagnosis not present

## 2020-08-06 LAB — BASIC METABOLIC PANEL
Anion gap: 21 — ABNORMAL HIGH (ref 5–15)
BUN: 49 mg/dL — ABNORMAL HIGH (ref 6–20)
CO2: 18 mmol/L — ABNORMAL LOW (ref 22–32)
Calcium: 9.5 mg/dL (ref 8.9–10.3)
Chloride: 98 mmol/L (ref 98–111)
Creatinine, Ser: 10.63 mg/dL — ABNORMAL HIGH (ref 0.61–1.24)
GFR, Estimated: 5 mL/min — ABNORMAL LOW (ref >60)
Glucose, Bld: 88 mg/dL (ref 70–99)
Potassium: 3.8 mmol/L (ref 3.5–5.1)
Sodium: 137 mmol/L (ref 135–145)

## 2020-08-06 LAB — CBC
HCT: 22.5 % — ABNORMAL LOW (ref 39.0–52.0)
Hemoglobin: 7 g/dL — ABNORMAL LOW (ref 13.0–17.0)
MCH: 34.3 pg — ABNORMAL HIGH (ref 26.0–34.0)
MCHC: 31.1 g/dL (ref 30.0–36.0)
MCV: 110.3 fL — ABNORMAL HIGH (ref 80.0–100.0)
Platelets: 145 10*3/uL — ABNORMAL LOW (ref 150–400)
RBC: 2.04 MIL/uL — ABNORMAL LOW (ref 4.22–5.81)
RDW: 15.6 % — ABNORMAL HIGH (ref 11.5–15.5)
WBC: 7.2 10*3/uL (ref 4.0–10.5)
nRBC: 0 % (ref 0.0–0.2)

## 2020-08-06 LAB — GLUCOSE, CAPILLARY
Glucose-Capillary: 143 mg/dL — ABNORMAL HIGH (ref 70–99)
Glucose-Capillary: 62 mg/dL — ABNORMAL LOW (ref 70–99)
Glucose-Capillary: 82 mg/dL (ref 70–99)
Glucose-Capillary: 83 mg/dL (ref 70–99)
Glucose-Capillary: 86 mg/dL (ref 70–99)

## 2020-08-06 LAB — IRON AND TIBC
Iron: 72 ug/dL (ref 45–182)
Saturation Ratios: 51 % — ABNORMAL HIGH (ref 17.9–39.5)
TIBC: 140 ug/dL — ABNORMAL LOW (ref 250–450)
UIBC: 68 ug/dL

## 2020-08-06 LAB — TECHNOLOGIST SMEAR REVIEW

## 2020-08-06 LAB — PROTIME-INR
INR: 1.2 (ref 0.8–1.2)
Prothrombin Time: 14.6 seconds (ref 11.4–15.2)

## 2020-08-06 LAB — PREPARE RBC (CROSSMATCH)

## 2020-08-06 LAB — FERRITIN: Ferritin: 2380 ng/mL — ABNORMAL HIGH (ref 24–336)

## 2020-08-06 LAB — SAVE SMEAR(SSMR), FOR PROVIDER SLIDE REVIEW

## 2020-08-06 MED ORDER — PENTAFLUOROPROP-TETRAFLUOROETH EX AERO
INHALATION_SPRAY | CUTANEOUS | Status: AC
  Filled 2020-08-06: qty 116

## 2020-08-06 MED ORDER — SODIUM CHLORIDE 0.9% IV SOLUTION: Freq: Once | INTRAVENOUS | Status: DC

## 2020-08-06 MED ORDER — HEPARIN (PORCINE) 25000 UT/250ML-% IV SOLN
1300.0000 [IU]/h | INTRAVENOUS | Status: DC
  Administered 2020-08-06: 900 [IU]/h via INTRAVENOUS
  Administered 2020-08-07: 800 [IU]/h via INTRAVENOUS
  Administered 2020-08-08: 1300 [IU]/h via INTRAVENOUS
  Filled 2020-08-06 (×6): qty 250

## 2020-08-06 MED ORDER — OXYCODONE HCL 5 MG PO TABS
5.0000 mg | ORAL_TABLET | Freq: Once | ORAL | Status: AC
  Administered 2020-08-06: 5 mg via ORAL
  Filled 2020-08-06: qty 1

## 2020-08-06 MED ORDER — HEPARIN (PORCINE) 25000 UT/250ML-% IV SOLN
900.0000 [IU]/h | INTRAVENOUS | Status: DC
  Filled 2020-08-06: qty 250

## 2020-08-06 MED ORDER — OXYCODONE-ACETAMINOPHEN 5-325 MG PO TABS
ORAL_TABLET | ORAL | Status: AC
  Administered 2020-08-06: 1 via ORAL
  Filled 2020-08-06: qty 1

## 2020-08-06 MED ORDER — GABAPENTIN 100 MG PO CAPS: 100.0000 mg | ORAL_CAPSULE | Freq: Every day | ORAL | Status: DC

## 2020-08-06 MED ORDER — HEPARIN BOLUS VIA INFUSION
3900.0000 [IU] | Freq: Once | INTRAVENOUS | Status: DC
  Filled 2020-08-06: qty 3900

## 2020-08-06 NOTE — Progress Notes (Signed)
PT Cancellation Note  Patient Details Name: James [name] MRN: [id number] DOB: [birth year]   Cancelled Treatment:    Reason Eval/Treat Not Completed: Other (comment).  Reports he is quite tired from poor sleep and just having pain better managed.  Will retry at another time.   Ramond Dial 08/06/2020, 11:35 AM   Mee Hives, PT MS Acute Rehab Dept. Number: [facility]

## 2020-08-06 NOTE — Progress Notes (Signed)
Subjective:   No events overnight  James [name] was seen and evaluated at bedside this AM. He states that he slept as well as he could for being in a hospital. Still has pain in his LLE. He denies fevers or chills, shortness of breath, constipation or diarrhea.   Objective:  Vital signs in last 24 hours: Vitals:   08/05/20 1629 08/06/20 0357 08/06/20 0650 08/06/20 0750  BP: (!) 145/84 (!) 169/87 (!) 156/82 (!) 153/85  Pulse: 90 87 88 92  Resp: 15 18  16   Temp: 97.9 F (36.6 C) 98.2 F (36.8 C)  98.1 F (36.7 C)  TempSrc: Oral Oral  Oral  SpO2: 99% 100%  99%  Weight:  65.5 kg     Physical Exam Vitals and nursing note reviewed.  Constitutional:      General: He is not in acute distress.    Appearance: Normal appearance.  HENT:     Head: Normocephalic and atraumatic.  Cardiovascular:     Rate and Rhythm: Normal rate and regular rhythm.     Pulses: Normal pulses.     Heart sounds: Normal heart sounds. No murmur heard. No friction rub. No gallop.   Pulmonary:     Effort: Pulmonary effort is normal. No respiratory distress.     Breath sounds: Normal breath sounds. No stridor. No wheezing, rhonchi or rales.  Abdominal:     General: Abdomen is flat.     Palpations: Abdomen is soft.     Tenderness: There is no abdominal tenderness. There is no guarding or rebound.  Musculoskeletal:     Comments: Right BKA. Left TMA wrapped in gauze  Neurological:     Mental Status: He is alert.    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  #Leukocytosis #Fever  WBC of 8.8>7.2 this am. No fever over past 24 hrs, however, does have PRN tylenol for pain. BC  negative thus far. MRI left foot performed, suspected osteo with presence of fluid possibly representing phlegmon or abscess. While it is difficult to assess if this is secondary to recent TMA on 07/20/20 or new infection present. While the area does not appear erythematous or with active drainage (scant serosanguineous drainage), the patient has had worsening pain of the extremity with initial leukocytosis and fever prior to beginning antibiotics, the decision was made to consult orthopedics.   Consulted Dr. Delfino Lovett, orthopedic surgeon on call, who believes this is consistent with recent surgery. Awaiting their recommendations. Will continue antibiotics for now. -continue Vancomycin and Ceftriaxone, MRSA nasal positive -BC negative thus far -continue to monitor, daily cbc  #Acute Encephalopathy #Anion Gap Metabolic Acidosis Encephalopathy has resolved. AG of 21 this am, patient receiving dialysis today.   -continue to monitor mental status -dialysis per nephro.  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 Ortho consulted as per above. -wound care eval - daily dressing changes, apply xeroform gauze. -consider SNF placement at discharge -follow up with surgeons once discharge for suture removal -PT/OT consult in place  #Anemia Morning Hgb of 7.5>7.0. 1 unit of blood ordered to be given during dialysis. -no acute bleed per colonoscopy/endoscopy -daily cbc -transfuse hgb under 7.0 -  hold anticoagulants  #ESRD on Dialysis MWF #Hx of Failed Kidney Transplant 2018 #Hyperkalemia Dialysis today. Off schedule due to GI procedures today. Will trend electrolytes -continue prednisone and tacrolimus in context of renal transplant -per nephro  #Troponemia Elevated troponin's upon admission. Denies recent episodes of chest pain/shortness of breath. Continue to suspect secondary to demand ischemia in setting of  uncontrolled HTN.  -cards to hold off on further procedure in setting of suspected osteomyelitis on MRI imaging.  -ortho recommendations pending  #New Systolic HF  EF 16-57%, LV moderately decreased function. Global hypokinesis. Appears euvolemic on exam today. Cardiology to perform cardiac cath -continue further workup, further cardiology intervention per above  #Sinus Tachycardia #QT Prolongation Hx of sinus tachycardia, resumed home metoprolol 50 mg q6h. HR has improved since restarting.  -continue to monitor on tele -avoid qtc prolonging medications  #Hx of HTN Home medications of amlodipine, lisinopril.  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 Negative GI workup, no source of bleeding found during colonoscopy/endoscopy.  -continue heparin  #Acute Hypoxic Respiratory Failure - Resolved #Chest Pain Saturating well on room air, currently denies shortness of breath. Denies new episodes of chest pain.  -CTA chest negative for aortic dissection -dialysis per nephro  Diet: Renal IVF: None,None VTE: SCDs Code: Full  Dispo: Discharge to home or SNF pending further workup  Sanjuana Letters DO Internal Medicine Resident PGY-1 Pager [phone] Please contact the on call pager after 5 pm and on weekends at [phone].

## 2020-08-06 NOTE — Progress Notes (Signed)
[facility] Progress Note    Assessment/ Plan:   ESRD:  -h/o failed txp -outpatient orders [facility]: F200, 3.5HRS, 350/700. 2K/2.5Ca. Heparin 1k units bolus -moving HD to tomorrow 1/15 given cscope and egd today (high patient census in dialysis today), resume MWF thereafter. From a volume and labs standpoint, okay to wait until tomorrow -no heparin here -continue with home pred and tacrolimus  NSTEMI -appreciate cardiology -ischemic workup on hold given possible infection and blood loss anemia  New CHF -ef now 35-40%, cardiology on board. UF as tolerated  Tachyarrhythmia, improved. Sinus tach -rapid response called in dialysis unit 1/12, held further UF, improved with valsalva. Looked like SVT initially on tele w/ HR up to the 180s, sinus tach on ekg. -needs to get BB prior to HD  Leukocytosis/fevers -avf looks good, likely not a source -questionable OM however per ortho consistent with post op changes -abx per primary service  Volume/ hypertension/pulmonary edema: -edw 69kg, UF as tolerated. Resume home anti-htns. SOB resolved  Anemia secondary to Chronic Kidney Disease and blood loss: -aranesp 76mcg every Wed outpatient. Increased aranesp to 180mcg, dose to be given monday -venofer once every 2 weeks. Iron panel ordered, holding on starting iron given infection eval -1u prbc on 1/11 -stool occult positive, GI on board, cscope and egd 1/14 -macrocytic, b12 high, folate ok -transfuse prn for hgb <7  Secondary Hyperparathyroidism/Hyperphosphatemia: resume home binders (on renvela), monitor phos and PTH  -hectorol 4.76mcg qtreatment  AMS/encephalopathy -persistent despite HD, possibly another etiology rather than uremic encephalopathy. Mgmt per primary service. Sepsis?  Splenic infarct -a/c per primary service, inr at goal  Vascular access: rue avf +b/t  # Additional recommendations: - Dose all meds for creatinine clearance <10  ml/min  - Unless absolutely necessary, no MRIs with gadolinium.  - Implement save arm precautions. Prefer needle sticks in the dorsum of the hands or wrists. No blood pressure measurements in arm. - If blood transfusion is requested during hemodialysis sessions, please alert [place] prior to the session.  - If a hemodialysis catheter line culture is requested, please alert [place] as only hemodialysis nurses are able to collect those specimens.   Gean Quint, MD [facility]  Subjective:   Seen and examined earlier this AM and again in HD. No acute events, resting comfortably, no complaints. S/p egd and cscope 1/14. UFG 3500   Objective:   BP 135/80   Pulse 83   Temp 98 F (36.7 C) (Oral)   Resp 19   Wt 63.9 kg   SpO2 99%   BMI 22.06 kg/m   Intake/Output Summary (Last 24 hours) at 08/06/2020 1406 Last data filed at 08/05/2020 2300 Gross per 24 hour  Intake 360 ml  Output --  Net 360 ml   Weight change:   Physical Exam: Gen:nad CVS:reg rate Resp: unlabored, no iwob ZOX:WRUE, nt/nd Ext:no edema Neuro: speech clear and coherent, moves all ext spontaneously, alert and oriented Access: rue avf +b/t  Imaging: MR FOOT LEFT WO CONTRAST  Result Date: 08/05/2020 CLINICAL DATA:  Foot swelling. Diabetic. History of transmetatarsal left foot amputation EXAM: MRI OF THE LEFT FOOT WITHOUT CONTRAST TECHNIQUE: Multiplanar, multisequence MR imaging of the left foot was performed. No intravenous contrast was administered. COMPARISON:  X-ray 07/21/2020 FINDINGS: Status post transmetatarsal amputation of the first-fifth rays at the level of the proximal to mid diaphysis. Ill-defined fluid within the soft tissues at the distal stump approximating the resection margins. There is extension of fluid signal into  the marrow spaces of the second, third, fourth, and fifth metatarsals at their resection margins with corresponding low T1 signal (series 9, images 15-22; series 8, images 10-16).  Marrow signal changes involve less than 1.0 cm of each of the involved metatarsal diaphyses. The first metatarsal resection margin is preserved without erosion or marrow signal changes. Remaining marrow structures of the midfoot and hindfoot are within normal limits. No fracture or malalignment. Trace tibiotalar and subtalar joint effusions are nonspecific. Subtle areas of skin irregularity along the more dorsal aspect of the distal stump which may reflect incision site or superficial ulceration. Area of ill-defined fluid within the soft tissues predominantly surrounding the second, third, and fourth metatarsal resection margins measures approximately 4.0 x 1.5 x 2.5 cm (series 6, image 49; series 9, image 15). There is dorsal subcutaneous edema. Moderate volume tenosynovial fluid associated with the flexor digitorum tendons tracking to the level of the midfoot (series 6, image 38; series 9, image 12). Mild diffuse edema-like signal throughout the intrinsic foot musculature suggesting a nonspecific myositis. IMPRESSION: 1. Status post transmetatarsal amputation of the first-fifth rays. Marrow signal changes within the second, third, fourth, and fifth metatarsals at their resection margins are compatible with acute osteomyelitis. 2. Ill-defined fluid collection at the distal stump centered around the second-fourth metatarsal resection margins measuring up to 4.0 cm suggestive of a phlegmon/developing abscess. 3. Moderate volume tenosynovial fluid associated with the flexor digitorum tendons tracking to the level of the midfoot. Electronically Signed   By: Davina Poke D.O.   On: 08/05/2020 16:16    Labs: BMET Recent Labs  Lab 08/01/20 1020 08/01/20 1026 08/02/20 0628 08/03/20 0249 08/03/20 1248 08/04/20 0249 08/05/20 0225 08/06/20 0825  NA 134* 136  135 135 135 136 139 138 137  K 6.0* 5.8*  5.9* 5.3* 4.2 2.9* 4.2 3.9 3.8  CL 95* 100 95* 92* 95* 97* 96* 98  CO2 18*  --  20* 25 27 28 23  18*   GLUCOSE 82 87 81 85 87 86 69* 88  BUN 69* 66* 82* 41* 10 17 31* 49*  CREATININE 12.15* 12.20* 14.21* 8.80* 2.94* 5.33* 7.99* 10.63*  CALCIUM 9.8  --  9.4 9.5 9.2 9.6 10.0 9.5  PHOS  --   --  5.7* 4.5 1.9*  --   --   --    CBC Recent Labs  Lab 08/03/20 1248 08/04/20 0249 08/05/20 0225 08/06/20 0307  WBC 9.1 8.2 8.8 7.2  HGB 7.8* 7.4* 7.5* 7.0*  HCT 23.9* 22.7* 24.3* 22.5*  MCV 106.2* 108.1* 111.0* 110.3*  PLT 178 160 169 145*    Medications:    . sodium chloride   Intravenous Once  . sodium chloride   Intravenous Once  . amLODipine  10 mg Oral Daily  . Chlorhexidine Gluconate Cloth  6 each Topical Q0600  . [START ON 08/08/2020] darbepoetin (ARANESP) injection - DIALYSIS  100 mcg Intravenous Q Mon-HD  . insulin aspart  0-6 Units Subcutaneous TID WC  . lidocaine  1 patch Transdermal Q24H  . metoprolol tartrate  50 mg Oral Q6H  . mupirocin ointment  1 application Nasal BID  . predniSONE  5 mg Oral Q breakfast  . tacrolimus  0.5 mg Oral Daily      Gean Quint, MD [facility] 08/06/2020, 2:06 PM

## 2020-08-06 NOTE — Progress Notes (Addendum)
ANTICOAGULATION CONSULT NOTE - Initial Consult  Pharmacy Consult for IV heparin Indication: history of splenic infarct  Allergies  Allergen Reactions  . Tramadol Hcl Shortness Of Breath and Other (See Comments)    Tramadol HCl = Shortness of breath and Tramadol = Rash  . Nsaids Other (See Comments)    Was told by MD to not take these  . Darvon [Propoxyphene] Rash and Other (See Comments)    Broke out on eyelid     Patient Measurements: Weight: 65.5 kg (144 lb 6.4 oz) Heparin Dosing Weight: 65.5 kg  Vital Signs: Temp: 98.1 F (36.7 C) (01/15 0750) Temp Source: Oral (01/15 0750) BP: 153/85 (01/15 0750) Pulse Rate: 92 (01/15 0750)  Labs: Recent Labs    08/04/20 0249 08/04/20 1239 08/05/20 0225 08/06/20 0307 08/06/20 0825  HGB 7.4*  --  7.5* 7.0*  --   HCT 22.7*  --  24.3* 22.5*  --   PLT 160  --  169 145*  --   LABPROT  --  17.5*  --  14.6  --   INR  --  1.5*  --  1.2  --   CREATININE 5.33*  --  7.99*  --  10.63*    Estimated Creatinine Clearance: 8 mL/min (A) (by C-G formula based on SCr of 10.63 mg/dL (H)).   Medical History: Past Medical History:  Diagnosis Date  . Anemia   . Avascular necrosis ([place])   . BPH (benign prostatic hyperplasia)   . Complication of anesthesia    slow to wake up  . ESRD (end stage renal disease) ([place])    failed kidney transplant  . GERD (gastroesophageal reflux disease)   . Glaucoma   . History of blood transfusion   . Hypertension   . Hypertriglyceridemia   . Influenza A   . 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 Peripheral vascular disease ([place])     s/p prior R BKA 07/2019 and recent L TMA 06/2020  . Pneumonia 04/21/2017   several times per pt on 04/22/20  . Prolonged QT interval   . Prostate cancer ([place])    prostate cancer, s/p prostatectomy 04/24/19  . Renal disorder    dialysis m w f  . Renal transplant recipient / ESRD    ESRD due to HTN > started  HD in 2003, got HD in [place] until about 2006-07 when he was discharged from [facility]. He then got HD in [facility] until getting a renal transplant in 2009 at [place], [place].  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    . Smoker    tx wellbutriin  . 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 Tachycardia   . Thrombocytopenia ([place])   . Transplant recipient   . Type 2 diabetes mellitus ([place])    type 2 - pt states no meds, diet controlled as of 04/22/20    Medications:  Medications Prior to Admission  Medication Sig Dispense Refill Last Dose  . acetaminophen (TYLENOL) 500 MG tablet Take 500-1,000 mg by mouth every 6 (six) hours as needed (pain or headaches).    Past Week at Unknown time  . amLODipine (NORVASC) 10 MG tablet Take 1 tablet (10 mg total) by mouth daily. 30 tablet 0 Past Month at Unknown time  .  aspirin EC 81 MG tablet Take 81 mg by mouth daily. Swallow whole.   Past Month at Unknown time  . diphenhydrAMINE (BENADRYL) 25 mg capsule Take 25 mg by mouth every 6 (six) hours as needed for itching.    unk  . warfarin (COUMADIN) 5 MG tablet Take 5-7.5 mg by mouth See admin instructions. Take 1.5 tablets (7.5 mg) by mouth in the evenings on Mondays, Wednesdays, & Fridays. Take 1 tablet (5 mg) by mouth in the evening on Sundays, Tuesdays, Thursdays, & Saturdays.   07/19/2020  . buPROPion (WELLBUTRIN XL) 150 MG 24 hr tablet Take 150 mg by mouth daily.     . Cyanocobalamin (B-12) 500 MCG TABS Take 500 mcg by mouth daily.      Marland Kitchen doxazosin (CARDURA) 2 MG tablet Take 2 mg by mouth at bedtime.     . ferric citrate (AURYXIA) 1 GM 210 MG(Fe) tablet Take 210-420 mg by mouth See admin instructions. Take 2 tablets (420 mg) by mouth up to three times daily with meals and 1 tablet (210 mg) with snacks     . gabapentin (NEURONTIN) 100 MG capsule Take 100 mg by mouth at  bedtime.     . insulin aspart (NOVOLOG FLEXPEN) 100 UNIT/ML FlexPen Inject 10 Units into the skin 3 (three) times daily with meals. (Patient taking differently: Inject 2 Units into the skin 3 (three) times daily as needed for high blood sugar (CBG >180).) 15 mL 11   . Insulin Detemir (LEVEMIR FLEXTOUCH) 100 UNIT/ML Pen Inject 15 Units into the skin at bedtime. (Patient not taking: Reported on 08/01/2020) 15 mL 11 Not Taking at Unknown time  . latanoprost (XALATAN) 0.005 % ophthalmic solution Place 1 drop into both eyes at bedtime.      Marland Kitchen lisinopril (ZESTRIL) 10 MG tablet Take 10 mg by mouth at bedtime.     Marland Kitchen loperamide (IMODIUM) 2 MG capsule Take 2 mg by mouth as needed for diarrhea or loose stools.      . Melatonin 5 MG CAPS Take 5 mg by mouth at bedtime as needed (sleep).     . metoprolol tartrate (LOPRESSOR) 100 MG tablet Take 100 mg by mouth 2 (two) times daily.      Marland Kitchen omeprazole (PRILOSEC) 20 MG capsule Take 20 mg by mouth daily.      Marland Kitchen oxyCODONE (OXY IR/ROXICODONE) 5 MG immediate release tablet Take 5 mg by mouth every 6 (six) hours as needed (pain).     Marland Kitchen oxyCODONE-acetaminophen (PERCOCET) 5-325 MG tablet Take 1 tablet by mouth every 6 (six) hours as needed. 20 tablet 0   . predniSONE (DELTASONE) 5 MG tablet Take 5 mg by mouth daily with breakfast.      . tacrolimus (PROGRAF) 0.5 MG capsule Take 0.5 mg by mouth daily.  (Patient not taking: Reported on 08/02/2020)   Not Taking at Unknown time  . triamcinolone cream (KENALOG) 0.1 % Apply 1 application topically daily. (dermatitis) After bathing     . valGANciclovir (VALCYTE) 450 MG tablet Take 450 mg by mouth every Monday, Wednesday, and Friday with hemodialysis.   5   . zolpidem (AMBIEN) 10 MG tablet Take 10 mg by mouth at bedtime as needed for sleep.        Assessment: 50 year old male admitted for acute hypoxic respiratory failure with PMH of ESRD on hemodialysis, history of failed transplant, history of lymphoma, hypertension, BPH, prostate  cancer, type 2 diabetes, peripheral vascular disease, right BKA and recent left TMA.  Pharmacy consulted to start IV heparin for history of splenic infarct.  Prior to admission warfarin for history of splenic infarct. Home regimen 7.5 mg on MWF and 5 mg rest of week, last dose 07/19/20.  Warfarin held since admission due to FOBT positive and suspected bleed. Workup with colonoscopy and EGD, cleared to resume anticoagulation per GI. Hgb 7.0 today, downtrending from 8.5 over past 4 days. Platelets slightly below normal 145. INR 1.2.  Cardiology considering cath for chest pain with suspected NSTEMI and new LV dysfunction, pending ortho clearance given MRI concerns for osteomyelitis.   Will initiate without bolus given bleeding risk.  Goal of Therapy:  Heparin level 0.3-0.7 units/ml Monitor platelets by anticoagulation protocol: Yes   Plan:  Start heparin infusion at 900 units/hr (no bolus) Heparin level in 6 hours Daily heparin level, CBC Monitor s/sx bleeding F/u transition back to warfarin after procedures  Fara Olden, PharmD PGY-1 Pharmacy Resident 08/06/2020 12:34 PM Please see AMION for all pharmacy numbers  ADDENDUM: Patient went to dialysis before IV heparin was started. Per HD RN, floor RN would need to go to dialysis to start infusion. ~2 hours left of dialysis session.  Per Dr. Johnney Ou, okay to start IV heparin when patient returns to floor. Spoke to pt's RN to update on plan, RN will start heparin infusion at 1630. Heparin level in 6 hours.

## 2020-08-06 NOTE — Progress Notes (Signed)
Pharmacy Antibiotic Note  James [name] is a 50 y.o. male with PMD of ESRD on HD, hx of failed kidney transplant, PVD, R BKA and recent left TMA who was admitted on 08/01/2020 with acute encephalopathy and respiratory failure. Pt was febrile and pharmacy was been consulted for vancomycin dosing for empiric therapy.  MRI on 1/14 of left foot compatible with acute osteomyelitis, ortho has been consulted.  WBC 7.2, afebrile, pt on MWF HD schedule but is off schedule due to procedure on 1/14, next HD today 1/15.  Plan: Vancomycin 1250 mg IV X 1, followed by vancomycin 750 mg IV with each HD (MWF) - adjusted next dose to 1/15 after off-schedule HD  Monitor WBC, temp, clinical improvement, vancomycin levels  Weight: 65.5 kg (144 lb 6.4 oz)  Temp (24hrs), Avg:98 F (36.7 C), Min:97.8 F (36.6 C), Max:98.2 F (36.8 C)  Recent Labs  Lab 08/02/20 0628 08/02/20 0657 08/03/20 0249 08/03/20 1248 08/04/20 0249 08/05/20 0225 08/06/20 0307  WBC  --    < > 10.3 9.1 8.2 8.8 7.2  CREATININE 14.21*  --  8.80* 2.94* 5.33* 7.99*  --    < > = values in this interval not displayed.    Estimated Creatinine Clearance: 10.6 mL/min (A) (by C-G formula based on SCr of 7.99 mg/dL (H)).    Allergies  Allergen Reactions  . Tramadol Hcl Shortness Of Breath and Other (See Comments)    Tramadol HCl = Shortness of breath and Tramadol = Rash  . Nsaids Other (See Comments)    Was told by MD to not take these  . Darvon [Propoxyphene] Rash and Other (See Comments)    Broke out on eyelid     Antimicrobials this admission: Ceftriaxone 1/10, 1/12 >> Vancomycin 1/12 >>  Microbiology results: 1/10 COVID, flu A, flu B, HIV screen: negative 1/11 HBsAg: negative 1/12 bld: ngtd 1/14 MRSA PCR: positive  Thank you for allowing pharmacy to be a part of this patient's care.  Fara Olden, PharmD PGY-1 Pharmacy Resident 08/06/2020 8:14 AM Please see AMION for all pharmacy numbers

## 2020-08-06 NOTE — Progress Notes (Addendum)
Progress Note  Patient Name: James [name] Date of Encounter: 08/06/2020  Primary Cardiologist: Mertie Moores, MD  Subjective   No chest pain or shortness of breath.  EGD and colonoscopy yesterday.  MRI also yesterday that showed evidence of osteomyelitis.  No complaints today.  Inpatient Medications    Scheduled Meds: . sodium chloride   Intravenous Once  . sodium chloride   Intravenous Once  . amLODipine  10 mg Oral Daily  . Chlorhexidine Gluconate Cloth  6 each Topical Q0600  . [START ON 08/08/2020] darbepoetin (ARANESP) injection - DIALYSIS  100 mcg Intravenous Q Mon-HD  . insulin aspart  0-6 Units Subcutaneous TID WC  . lidocaine  1 patch Transdermal Q24H  . metoprolol tartrate  50 mg Oral Q6H  . mupirocin ointment  1 application Nasal BID  . predniSONE  5 mg Oral Q breakfast  . tacrolimus  0.5 mg Oral Daily   Continuous Infusions: . cefTRIAXone (ROCEPHIN)  IV 2 g (08/05/20 1705)  . vancomycin    . [START ON 08/08/2020] vancomycin     PRN Meds: acetaminophen **OR** acetaminophen, oxyCODONE-acetaminophen, polyethylene glycol   Vital Signs    Vitals:   08/05/20 1629 08/06/20 0357 08/06/20 0650 08/06/20 0750  BP: (!) 145/84 (!) 169/87 (!) 156/82 (!) 153/85  Pulse: 90 87 88 92  Resp: 15 18  16   Temp: 97.9 F (36.6 C) 98.2 F (36.8 C)  98.1 F (36.7 C)  TempSrc: Oral Oral  Oral  SpO2: 99% 100%  99%  Weight:  65.5 kg      Intake/Output Summary (Last 24 hours) at 08/06/2020 0950 Last data filed at 08/05/2020 2300 Gross per 24 hour  Intake 360 ml  Output --  Net 360 ml   Last 3 Weights 08/06/2020 08/04/2020 08/03/2020  Weight (lbs) 144 lb 6.4 oz 140 lb 6.9 oz 138 lb 7.2 oz  Weight (kg) 65.5 kg 63.7 kg 62.8 kg     Telemetry    Sinus rhythm-personally reviewed  Physical Exam   GEN: Well nourished, well developed, in no acute distress  HEENT: normal  Neck: no JVD, carotid bruits, or masses Cardiac: RRR; no murmurs, rubs, or gallops,no edema   Respiratory:  clear to auscultation bilaterally, normal work of breathing GI: soft, nontender, nondistended, + BS MS: Right BKA, left TMA Skin: warm and dry,  Neuro:  Strength and sensation are intact Psych: euthymic mood, full affect   Labs    High Sensitivity Troponin:   Recent Labs  Lab 08/01/20 1020 08/01/20 1555 08/01/20 1828  TROPONINIHS 1,049* 1,925* 1,964*      Cardiac EnzymesNo results for input(s): TROPONINI in the last 168 hours. No results for input(s): TROPIPOC in the last 168 hours.   Chemistry Recent Labs  Lab 08/02/20 0628 08/03/20 0249 08/03/20 1248 08/04/20 0249 08/05/20 0225 08/06/20 0825  NA 135   < > 136 139 138 137  K 5.3*   < > 2.9* 4.2 3.9 3.8  CL 95*   < > 95* 97* 96* 98  CO2 20*   < > 27 28 23  18*  GLUCOSE 81   < > 87 86 69* 88  BUN 82*   < > 10 17 31* 49*  CREATININE 14.21*   < > 2.94* 5.33* 7.99* 10.63*  CALCIUM 9.4   < > 9.2 9.6 10.0 9.5  PROT 6.6  --   --  7.5  --   --   ALBUMIN 2.4*  2.4*  --  2.5*  2.3*  --   --   AST 38  --   --  38  --   --   ALT 35  --   --  36  --   --   ALKPHOS 209*  --   --  182*  --   --   BILITOT 0.7  --   --  0.7  --   --   GFRNONAA 4*   < > 26* 13* 8* 5*  ANIONGAP 20*   < > 14 14 19* 21*   < > = values in this interval not displayed.     Hematology Recent Labs  Lab 08/04/20 0249 08/05/20 0225 08/06/20 0307  WBC 8.2 8.8 7.2  RBC 2.10* 2.19* 2.04*  HGB 7.4* 7.5* 7.0*  HCT 22.7* 24.3* 22.5*  MCV 108.1* 111.0* 110.3*  MCH 35.2* 34.2* 34.3*  MCHC 32.6 30.9 31.1  RDW 15.9* 15.7* 15.6*  PLT 160 169 145*    BNPNo results for input(s): BNP, PROBNP in the last 168 hours.   DDimer No results for input(s): DDIMER in the last 168 hours.   Radiology    MR FOOT LEFT WO CONTRAST  Result Date: 08/05/2020 CLINICAL DATA:  Foot swelling. Diabetic. History of transmetatarsal left foot amputation EXAM: MRI OF THE LEFT FOOT WITHOUT CONTRAST TECHNIQUE: Multiplanar, multisequence MR imaging of the left foot  was performed. No intravenous contrast was administered. COMPARISON:  X-ray 07/21/2020 FINDINGS: Status post transmetatarsal amputation of the first-fifth rays at the level of the proximal to mid diaphysis. Ill-defined fluid within the soft tissues at the distal stump approximating the resection margins. There is extension of fluid signal into the marrow spaces of the second, third, fourth, and fifth metatarsals at their resection margins with corresponding low T1 signal (series 9, images 15-22; series 8, images 10-16). Marrow signal changes involve less than 1.0 cm of each of the involved metatarsal diaphyses. The first metatarsal resection margin is preserved without erosion or marrow signal changes. Remaining marrow structures of the midfoot and hindfoot are within normal limits. No fracture or malalignment. Trace tibiotalar and subtalar joint effusions are nonspecific. Subtle areas of skin irregularity along the more dorsal aspect of the distal stump which may reflect incision site or superficial ulceration. Area of ill-defined fluid within the soft tissues predominantly surrounding the second, third, and fourth metatarsal resection margins measures approximately 4.0 x 1.5 x 2.5 cm (series 6, image 49; series 9, image 15). There is dorsal subcutaneous edema. Moderate volume tenosynovial fluid associated with the flexor digitorum tendons tracking to the level of the midfoot (series 6, image 38; series 9, image 12). Mild diffuse edema-like signal throughout the intrinsic foot musculature suggesting a nonspecific myositis. IMPRESSION: 1. Status post transmetatarsal amputation of the first-fifth rays. Marrow signal changes within the second, third, fourth, and fifth metatarsals at their resection margins are compatible with acute osteomyelitis. 2. Ill-defined fluid collection at the distal stump centered around the second-fourth metatarsal resection margins measuring up to 4.0 cm suggestive of a phlegmon/developing  abscess. 3. Moderate volume tenosynovial fluid associated with the flexor digitorum tendons tracking to the level of the midfoot. Electronically Signed   By: Davina Poke D.O.   On: 08/05/2020 16:16    Cardiac Studies   2D echo 08/03/20   1. EF estimate limited as only para sternal images obtained no apical or  sub costal images done due to patient pain and lack of cooperation . Left  ventricular ejection fraction, by estimation, is  35 to 40%. The left  ventricle has moderately decreased  function. The left ventricle demonstrates global hypokinesis. The left  ventricular internal cavity size was moderately dilated. There is moderate  left ventricular hypertrophy.  2. Left atrial size was moderately dilated.  3. The mitral valve is abnormal. Mild mitral valve regurgitation.  Moderate mitral annular calcification.  4. Calcified non coronary cusp. The aortic valve was not well visualized.  Mild to moderate aortic valve sclerosis/calcification is present, without  any evidence of aortic stenosis.   Patient Profile     50 y.o. 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 Complex PMH as outlined in consult note. Recently in the hospital for ischemic foot s/p L TMA. He presented to the hospital 08/01/2020 after having a fall and being found confused by his aide that arrived, also with some recollection of having had chest pain. He had been confirmed compliant with last scheduled HD session on Friday. He was hypertensive and tachycardic (sinus) on arrival. He required BiPAP on arrival  and quickly improved with NTG paste and BiPAP. CT angio ruled out dissection. Cardiology asked to see for elevated troponin. Other issues this admission include worsening anemia + FOBT, waxing/waning encephalopathy and fever. 2D Echo demonstrates new LV dysfunction.  Assessment & Plan    1. Acute encephalopathy with acute respiratory distress and pulmonary edema requiring BiPAP, also leukocytosis + fever during admission Etiology unclear.  Patient wonders if he took too many narcotics.  Now has osteomyelitis.  Plan per primary team.  2. Chest pain with suspected NSTEMI - also new LV dysfunction/suspected acute systolic CHF Likely does not account for his entire presentation.  High-sensitivity troponin peaked at 1964.  CT angio without dissection.  Echo with an ejection fraction of 35 to 40%.  Kiyoshi Schaab need left heart catheterization to further determine this is an ischemic cardiomyopathy.  Holding off on catheterization until osteomyelitis has improved.  3. Sinus tachycardia Improved currently.  4. Acute on chronic anemia/heme positive stool EGD with nonspecific gastritis.  Okay to resume blood thinners.  5. ESRD on hemodialysis, also with hyperkalemia Per nephrology.   6. PVD s/p R BKA 07/2019, L TMA 06/2020 Continue aspirin.  Plan per internal medicine.  7. Splenic infarct, on Coumadin prior to admission INR therapeutic.  Okay to continue per GI.  8. Prolonged QT interval on EKG Avoid additional QT prolonging medications.  Cardiology to see as needed through the weekend.  We Caisen Mangas reassess fitness for catheterization next week.  For questions or updates, please contact [place] Please consult [email or web address] for contact info under Cardiology/STEMI.  Signed, Gerell Fortson Meredith Leeds, MD 08/06/2020, 9:50 AM

## 2020-08-06 NOTE — Consult Note (Signed)
ORTHOPAEDIC CONSULTATION  REQUESTING PHYSICIAN: Lucious Groves, DO  PCP:  [name], MD  Chief Complaint: Evaluate left foot  HPI: James [name] is a 50 y.o. male with history of transmetatarsal amputation of the left foot on 07/21/20 at [facility]. He was admitted to [facility] due to respiratory failure. Patient was scheduled for heart catheterization which was canceled due to concern for osteomyelitis. Orthopedics was consulted for clearance for cardiac catheterization  Past Medical History:  Diagnosis Date  . Anemia   . Avascular necrosis ([place])   . BPH (benign prostatic hyperplasia)   . Complication of anesthesia    slow to wake up  . ESRD (end stage renal disease) ([place])    failed kidney transplant  . GERD (gastroesophageal reflux disease)   . Glaucoma   . History of blood transfusion   . Hypertension   . Hypertriglyceridemia   . Influenza A   . 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 Peripheral vascular disease ([place])     s/p prior R BKA 07/2019 and recent L TMA 06/2020  . Pneumonia 04/21/2017   several times per pt on 04/22/20  . Prolonged QT interval   . Prostate cancer ([place])    prostate cancer, s/p prostatectomy 04/24/19  . Renal disorder    dialysis m w f  . 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 He then got HD in [facility] until getting a renal transplant in 2009 at [place] [place].  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    . Smoker    tx wellbutriin  . 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 Tachycardia   . Thrombocytopenia ([place])   . Transplant recipient   . Type 2 diabetes mellitus ([place])    type 2 - pt states no meds, diet  controlled as of 04/22/20   Past Surgical History:  Procedure Laterality Date  . AV FISTULA PLACEMENT Right 11/02/2019   Procedure: ARTERIOVENOUS (AV) FISTULA CREATION RIGHT UPPER ARM;  Surgeon: Elam Dutch, MD;  Location: [facility] OR;  Service: Vascular;  Laterality: Right;  . AV FISTULA PLACEMENT Right 04/25/2020   Procedure: INSERTION OF ARTERIOVENOUS (AV) GORE-TEX GRAFT ARM;  Surgeon: Elam Dutch, MD;  Location: [place];  Service: Vascular;  Laterality: Right;  . BASCILIC VEIN TRANSPOSITION Right 11/02/2019   Procedure: Right Cephalic Vein Transposition;  Surgeon: Elam Dutch, MD;  Location: [place];  Service: Vascular;  Laterality: Right;  . BIOPSY  03/31/2019   Procedure: BIOPSY;  Surgeon: Clarene Essex, MD;  Location: [place];  Service: Endoscopy;;  . CAPD REMOVAL Left 11/04/2019   Procedure: CONTINUOUS AMBULATORY PERITONEAL DIALYSIS  (CAPD) CATHETER REMOVAL;  Surgeon: Ralene Ok, MD;  Location: [place];  Service: General;  Laterality: Left;  . CHOLECYSTECTOMY    . COLONOSCOPY WITH PROPOFOL N/A 03/31/2019   Procedure: COLONOSCOPY WITH PROPOFOL;  Surgeon: Clarene Essex, MD;  Location: [place];  Service: Endoscopy;  Laterality: N/A;  . HIP SURGERY    . INSERTION OF DIALYSIS CATHETER Left 04/24/2017   Procedure: EXCHANGE  OF DIALYSIS CATHETER;  Surgeon: Rosetta Posner, MD;  Location: [facility] OR;  Service: Vascular;  Laterality: Left;  . INSERTION OF DIALYSIS CATHETER Left 11/02/2019   Procedure: INSERTION OF TUNNELED DIALYSIS CATHETER LEFT INTERNAL JUGULAR;  Surgeon: Elam Dutch, MD;  Location: [place];  Service: Vascular;  Laterality: Left;  . JOINT REPLACEMENT Right    hip  . KIDNEY TRANSPLANT Right   . PERITONEAL CATHETER INSERTION Left 08/2017  . POLYPECTOMY  03/31/2019   Procedure: POLYPECTOMY;  Surgeon: Clarene Essex, MD;  Location: [place];  Service: Endoscopy;;  . VENOGRAM N/A 11/02/2019   Procedure: Elaina Hoops;  Surgeon: Elam Dutch, MD;  Location: [place] OR;   Service: Vascular;  Laterality: N/A;   Social History   Socioeconomic History  . Marital status: Single    Spouse name: Not on file  . Number of children: Not on file  . Years of education: Not on file  . Highest education level: Not on file  Occupational History  . Not on file  Tobacco Use  . Smoking status: Current Every Day Smoker    Packs/day: 0.25    Years: 26.00    Pack years: 6.50    Types: Cigarettes  . Smokeless tobacco: Never Used  Vaping Use  . Vaping Use: Never used  Substance and Sexual Activity  . Alcohol use: No  . Drug use: No  . Sexual activity: Not on file  Other Topics Concern  . Not on file  Social History Narrative  . Not on file   Social Determinants of Health   Financial Resource Strain: Not on file  Food Insecurity: Not on file  Transportation Needs: Not on file  Physical Activity: Not on file  Stress: Not on file  Social Connections: Not on file   Family History  Adopted: Yes   Allergies  Allergen Reactions  . Tramadol Hcl Shortness Of Breath and Other (See Comments)    Tramadol HCl = Shortness of breath and Tramadol = Rash  . Nsaids Other (See Comments)    Was told by MD to not take these  . Darvon [Propoxyphene] Rash and Other (See Comments)    Broke out on eyelid    Prior to Admission medications   Medication Sig Start Date End Date Taking? Authorizing Provider  acetaminophen (TYLENOL) 500 MG tablet Take 500-1,000 mg by mouth every 6 (six) hours as needed (pain or headaches).    Yes [provider]  amLODipine (NORVASC) 10 MG tablet Take 1 tablet (10 mg total) by mouth daily. 11/04/19 04/14/21 Yes Alma Friendly, MD  aspirin EC 81 MG tablet Take 81 mg by mouth daily. Swallow whole.   Yes [provider]  diphenhydrAMINE (BENADRYL) 25 mg capsule Take 25 mg by mouth every 6 (six) hours as needed for itching.    Yes [provider]  warfarin (COUMADIN) 5 MG tablet Take 5-7.5 mg by mouth See admin  instructions. Take 1.5 tablets (7.5 mg) by mouth in the evenings on Mondays, Wednesdays, & Fridays. Take 1 tablet (5 mg) by mouth in the evening on Sundays, Tuesdays, Thursdays, & Saturdays. 10/11/19  Yes [provider]  buPROPion (WELLBUTRIN XL) 150 MG 24 hr tablet Take 150 mg by mouth daily.    [provider]  Cyanocobalamin (B-12) 500 MCG TABS Take 500 mcg by mouth daily.  08/12/19   [provider]  doxazosin (CARDURA) 2 MG tablet Take 2 mg by mouth at bedtime.    [provider]  ferric citrate (AURYXIA) 1 GM 210 MG(Fe) tablet  Take 210-420 mg by mouth See admin instructions. Take 2 tablets (420 mg) by mouth up to three times daily with meals and 1 tablet (210 mg) with snacks    [provider]  gabapentin (NEURONTIN) 100 MG capsule Take 100 mg by mouth at bedtime. 03/11/20   [provider]  insulin aspart (NOVOLOG FLEXPEN) 100 UNIT/ML FlexPen Inject 10 Units into the skin 3 (three) times daily with meals. Patient taking differently: Inject 2 Units into the skin 3 (three) times daily as needed for high blood sugar (CBG >180). 04/25/17   Mariel Aloe, MD  Insulin Detemir (LEVEMIR FLEXTOUCH) 100 UNIT/ML Pen Inject 15 Units into the skin at bedtime. Patient not taking: Reported on 08/01/2020 09/23/17   Regalado, Jerald Kief A, MD  latanoprost (XALATAN) 0.005 % ophthalmic solution Place 1 drop into both eyes at bedtime.     [provider]  lisinopril (ZESTRIL) 10 MG tablet Take 10 mg by mouth at bedtime. 03/11/20   [provider]  loperamide (IMODIUM) 2 MG capsule Take 2 mg by mouth as needed for diarrhea or loose stools.     [provider]  Melatonin 5 MG CAPS Take 5 mg by mouth at bedtime as needed (sleep).    [provider]  metoprolol tartrate (LOPRESSOR) 100 MG tablet Take 100 mg by mouth 2 (two) times daily.  10/13/19   [provider]  omeprazole (PRILOSEC) 20 MG capsule Take 20 mg by mouth daily.      [provider]  oxyCODONE (OXY IR/ROXICODONE) 5 MG immediate release tablet Take 5 mg by mouth every 6 (six) hours as needed (pain). 07/28/20   [provider]  oxyCODONE-acetaminophen (PERCOCET) 5-325 MG tablet Take 1 tablet by mouth every 6 (six) hours as needed. 04/25/20   Rhyne, Hulen Shouts, PA-C  predniSONE (DELTASONE) 5 MG tablet Take 5 mg by mouth daily with breakfast.     [provider]  tacrolimus (PROGRAF) 0.5 MG capsule Take 0.5 mg by mouth daily.  Patient not taking: Reported on 08/02/2020    [provider]  triamcinolone cream (KENALOG) 0.1 % Apply 1 application topically daily. (dermatitis) After bathing    [provider]  valGANciclovir (VALCYTE) 450 MG tablet Take 450 mg by mouth every Monday, Wednesday, and Friday with hemodialysis.  08/22/17   [provider]  zolpidem (AMBIEN) 10 MG tablet Take 10 mg by mouth at bedtime as needed for sleep.     [provider]   MR FOOT LEFT WO CONTRAST  Result Date: 08/05/2020 CLINICAL DATA:  Foot swelling. Diabetic. History of transmetatarsal left foot amputation EXAM: MRI OF THE LEFT FOOT WITHOUT CONTRAST TECHNIQUE: Multiplanar, multisequence MR imaging of the left foot was performed. No intravenous contrast was administered. COMPARISON:  X-ray 07/21/2020 FINDINGS: Status post transmetatarsal amputation of the first-fifth rays at the level of the proximal to mid diaphysis. Ill-defined fluid within the soft tissues at the distal stump approximating the resection margins. There is extension of fluid signal into the marrow spaces of the second, third, fourth, and fifth metatarsals at their resection margins with corresponding low T1 signal (series 9, images 15-22; series 8, images 10-16). Marrow signal changes involve less than 1.0 cm of each of the involved metatarsal diaphyses. The first metatarsal resection margin is preserved without erosion or marrow signal changes. Remaining marrow  structures of the midfoot and hindfoot are within normal limits. No fracture or malalignment. Trace tibiotalar and subtalar joint effusions are nonspecific. Subtle areas  of skin irregularity along the more dorsal aspect of the distal stump which may reflect incision site or superficial ulceration. Area of ill-defined fluid within the soft tissues predominantly surrounding the second, third, and fourth metatarsal resection margins measures approximately 4.0 x 1.5 x 2.5 cm (series 6, image 49; series 9, image 15). There is dorsal subcutaneous edema. Moderate volume tenosynovial fluid associated with the flexor digitorum tendons tracking to the level of the midfoot (series 6, image 38; series 9, image 12). Mild diffuse edema-like signal throughout the intrinsic foot musculature suggesting a nonspecific myositis. IMPRESSION: 1. Status post transmetatarsal amputation of the first-fifth rays. Marrow signal changes within the second, third, fourth, and fifth metatarsals at their resection margins are compatible with acute osteomyelitis. 2. Ill-defined fluid collection at the distal stump centered around the second-fourth metatarsal resection margins measuring up to 4.0 cm suggestive of a phlegmon/developing abscess. 3. Moderate volume tenosynovial fluid associated with the flexor digitorum tendons tracking to the level of the midfoot. Electronically Signed   By: Davina Poke D.O.   On: 08/05/2020 16:16    Positive ROS: All other systems have been reviewed and were otherwise negative with the exception of those mentioned in the HPI and as above.  Physical Exam: General: Alert, no acute distress  Cardiovascular: Normal pulses Respiratory: No cyanosis, no use of accessory musculature GI: No organomegaly, abdomen is soft and non-tender Skin: Warm and dry Neurologic: He is alert Psychiatric: Patient is competent for consent with normal mood and affect   MUSCULOSKELETAL: Left foot with transmetatarsal  amputation. Pulse, movement, sensation intact. No erythema or drainage noted  Assessment: MRI changes consistent with post surgical changes s/p transmetatarsal amputation  Plan: Heart catheterization may proceed as planned. Case discussed with Dr.Chris Lucia Gaskins, foot and ankle specialist, who states that results are consistent with post surgical changes. Patient should follow up with surgeon at [facility] for further post operative care of the left foot    Dorothyann Peng, PA [phone]    08/06/2020 1:33 PM

## 2020-08-06 NOTE — Progress Notes (Signed)
Pt in no distress at this time requiring bipap.  RT will monitor for need.

## 2020-08-07 DIAGNOSIS — T8789 Other complications of amputation stump: Secondary | ICD-10-CM

## 2020-08-07 LAB — TYPE AND SCREEN
ABO/RH(D): O POS
Antibody Screen: NEGATIVE
Unit division: 0

## 2020-08-07 LAB — CBC
HCT: 27.4 % — ABNORMAL LOW (ref 39.0–52.0)
Hemoglobin: 8.7 g/dL — ABNORMAL LOW (ref 13.0–17.0)
MCH: 33 pg (ref 26.0–34.0)
MCHC: 31.8 g/dL (ref 30.0–36.0)
MCV: 103.8 fL — ABNORMAL HIGH (ref 80.0–100.0)
Platelets: 168 10*3/uL (ref 150–400)
RBC: 2.64 MIL/uL — ABNORMAL LOW (ref 4.22–5.81)
RDW: 18.1 % — ABNORMAL HIGH (ref 11.5–15.5)
WBC: 8.4 10*3/uL (ref 4.0–10.5)
nRBC: 0 % (ref 0.0–0.2)

## 2020-08-07 LAB — BASIC METABOLIC PANEL
Anion gap: 15 (ref 5–15)
BUN: 22 mg/dL — ABNORMAL HIGH (ref 6–20)
CO2: 27 mmol/L (ref 22–32)
Calcium: 9.1 mg/dL (ref 8.9–10.3)
Chloride: 96 mmol/L — ABNORMAL LOW (ref 98–111)
Creatinine, Ser: 5.66 mg/dL — ABNORMAL HIGH (ref 0.61–1.24)
GFR, Estimated: 12 mL/min — ABNORMAL LOW (ref >60)
Glucose, Bld: 131 mg/dL — ABNORMAL HIGH (ref 70–99)
Potassium: 3.4 mmol/L — ABNORMAL LOW (ref 3.5–5.1)
Sodium: 138 mmol/L (ref 135–145)

## 2020-08-07 LAB — HEPARIN LEVEL (UNFRACTIONATED)
Heparin Unfractionated: 1 IU/mL — ABNORMAL HIGH (ref 0.30–0.70)
Heparin Unfractionated: <0.1 IU/mL — ABNORMAL LOW (ref 0.30–0.70)

## 2020-08-07 LAB — GLUCOSE, CAPILLARY
Glucose-Capillary: 100 mg/dL — ABNORMAL HIGH (ref 70–99)
Glucose-Capillary: 101 mg/dL — ABNORMAL HIGH (ref 70–99)
Glucose-Capillary: 115 mg/dL — ABNORMAL HIGH (ref 70–99)
Glucose-Capillary: 98 mg/dL (ref 70–99)

## 2020-08-07 LAB — BPAM RBC
Blood Product Expiration Date: 202202132359
ISSUE DATE / TIME: 202201151416
Unit Type and Rh: 5100

## 2020-08-07 NOTE — Progress Notes (Signed)
Subjective:   No events overnight  James [name] was seen and evaluated at bedside this AM. Patient states that he had a rough night, due to his foot hurting. It is localized to the lateral side of his LLE with no radiation. He states that his dose of Norco helped alleviate the pain.   Objective:  Vital signs in last 24 hours: Vitals:   08/06/20 1628 08/06/20 1630 08/06/20 2028 08/07/20 0607  BP: 128/89 130/80 (!) 157/84   Pulse: (!) 104 98    Resp:   20   Temp: 98.4 F (36.9 C)  99 F (37.2 C) 98.9 F (37.2 C)  TempSrc: Oral  Oral Oral  SpO2: 99%  100%   Weight: 61.5 kg   66.1 kg   Physical Exam Constitutional:      General: He is not in acute distress.    Appearance: He is ill-appearing.  Pulmonary:     Effort: Pulmonary effort is normal.     Breath sounds: Normal breath sounds. No wheezing, rhonchi or rales.  Abdominal:     General: Abdomen is flat. Bowel sounds are normal. There is no distension.     Palpations: Abdomen is soft.     Tenderness: There is no abdominal tenderness.  Musculoskeletal:     Comments: RLE BKA, LLE TMA well wrapped in gauze, no erythema or purulence noted on examination. Continued tenderness on the lateral aspect of his appendage.   Skin:    General: Skin is warm and dry.  Neurological:     Mental Status: He is alert and oriented to person, place, and time.  Psychiatric:        Mood and Affect: Mood normal.        Behavior: Behavior normal.     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  #Leukocytosis #Fever  WBC of 8.8>7.2 this am. No fever over past 24 hrs, however, does have PRN tylenol for pain. BC  negative thus far. MRI left foot performed, suspected osteo with presence of fluid possibly representing phlegmon or abscess. While it is difficult to assess if this is secondary to recent TMA on 07/20/20 or new infection present. While the area does not appear erythematous or with active drainage (scant serosanguineous drainage), the patient has had worsening pain of the extremity with initial leukocytosis and fever prior to beginning antibiotics, the decision was made to consult orthopedics who states that MRI findings are consistent with post surgical change. ID consultation was recommended by ortho. - ID consulted today, we appreciate their recommendations.  - Continue Vancomycin and Ceftriaxone  #Acute Encephalopathy #Anion Gap Metabolic Acidosis Encephalopathy has resolved. AG of 15  -continue to monitor mental status -dialysis per nephro.  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 -wound care eval - daily dressing changes, apply xeroform gauze. -follow up with surgeons once discharge for suture removal -PT/OT consult in place  #Anemia Morning Hgb of 7.5>7.0. 1 unit of blood ordered to be given during dialysis. -no acute bleed per colonoscopy/endoscopy -daily cbc -transfuse hgb under 7.0 -hold anticoagulants  #ESRD on Dialysis MWF #Hx of Failed Kidney Transplant 2018 #Hyperkalemia Dialysis today.  Off schedule due to GI procedures today. Will trend electrolytes -continue prednisone and tacrolimus in context of renal transplant -per nephro  #Troponemia Elevated troponin's upon admission. Denies recent episodes of chest pain/shortness of breath. Continue to suspect secondary to demand ischemia in setting of uncontrolled HTN. Cards holding off until cleared by ortho. Ortho states that imaging is consistent with post surgical changes.   #New Systolic HF  EF 91-98%, LV moderately  decreased function. Global hypokinesis. Appears euvolemic on exam today. Cardiology to perform cardiac cath -continue further workup, further cardiology intervention per above  #Sinus Tachycardia #QT Prolongation Hx of sinus tachycardia, resumed home metoprolol 50 mg q6h. HR has improved since restarting.  -continue to monitor on tele -avoid qtc prolonging medications  #Hx of HTN Home medications of amlodipine, lisinopril.  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 Negative GI workup, no source of bleeding found during colonoscopy/endoscopy.  -continue heparin  #Acute Hypoxic Respiratory Failure - Resolved #Chest Pain Saturating well on room air, currently denies shortness of breath. Denies new episodes of chest pain.  -CTA chest negative for aortic dissection -dialysis per nephro  Diet: Renal IVF: None,None VTE: SCDs Code: Full  Dispo: Discharge to home or SNF pending further workup  Maudie Mercury, MD Internal Medicine Resident PGY-2 Pager [phone] Please contact the on call pager after 5 pm and on weekends at [phone].

## 2020-08-07 NOTE — Plan of Care (Signed)
  Problem: Education: Goal: Knowledge of General Education information will improve Description: Including pain rating scale, medication(s)/side effects and non-pharmacologic comfort measures Outcome: Progressing   Problem: Health Behavior/Discharge Planning: Goal: Ability to manage health-related needs will improve Outcome: Progressing   Problem: Clinical Measurements: Goal: Ability to maintain clinical measurements within normal limits will improve Outcome: Progressing   Problem: Clinical Measurements: Goal: Respiratory complications will improve Outcome: Progressing

## 2020-08-07 NOTE — Consult Note (Signed)
[facility] for [facility]ious Disease       Reason for Consult: transmetatarsal wound drainage    Referring Physician: Dr. Heber Barnsdall  Active Problems:   Tachycardia   End stage renal disease ()   Anemia   Renal transplant recipient   Respiratory failure ([place])   . sodium chloride   Intravenous Once  . sodium chloride   Intravenous Once  . amLODipine  10 mg Oral Daily  . Chlorhexidine Gluconate Cloth  6 each Topical Q0600  . [START ON 08/08/2020] darbepoetin (ARANESP) injection - DIALYSIS  100 mcg Intravenous Q Mon-HD  . insulin aspart  0-6 Units Subcutaneous TID WC  . lidocaine  1 patch Transdermal Q24H  . metoprolol tartrate  50 mg Oral Q6H  . mupirocin ointment  1 application Nasal BID  . predniSONE  5 mg Oral Q breakfast  . tacrolimus  0.5 mg Oral Daily    Recommendations: Continue with vancomycin and ceftriaxone   Assessment: He came in with leukocytosis and one fever on 1/12 that since has resolved on antibiotic treatment.  Source is unclear with no issues with his AVF, no positive cultures, no other symptoms.  His transmetatarsal amputation site which was done at [facility] has had some minimal drainage out of that the patient describes as yellowish but he had not noticed this drainage before.  MRI was done and noted bone marrow edema of several of the toes and orthopedics felt most c/w post surgical changes.  There is fluid around the 2nd, third and fourth metatarsals but not clear if it is abscess or phlegmon.  There is no warmth or other concerns on the foot.  MRI findings are not specific only to infection and could be post surgical, though unclear if an abscess is developing.  If there is infection, differential includes Staph, Strep, GNR so I would keep the vancomycin and ceftriaxone for now.    Antibiotics: Vancomycin and ceftriaxone  HPI: James [name] is a 50 y.o. male with ESRD with history of a failed transplant and remains on tacrilimus and prednisone who  came in 1/10 by EMS when his home health nurse found him altered at home.  He had significant leukocytosis and did develop fever but no obvious source. He has improved and now is afebrile with a normal WBC.  Some minimal drainage out of the area and his foot has been tender to touch.     Review of Systems:  Constitutional: negative for fevers and chills Gastrointestinal: negative for diarrhea Integument/breast: negative for rash All other systems reviewed and are negative    Past Medical History:  Diagnosis Date  . Anemia   . Avascular necrosis ([place])   . BPH (benign prostatic hyperplasia)   . Complication of anesthesia    slow to wake up  . ESRD (end stage renal disease) ([place])    failed kidney transplant  . GERD (gastroesophageal reflux disease)   . Glaucoma   . History of blood transfusion   . Hypertension   . Hypertriglyceridemia   . Influenza A   . 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 Peripheral vascular disease ([place])     s/p prior R BKA 07/2019 and recent L TMA 06/2020  . Pneumonia 04/21/2017   several times per pt on 04/22/20  . Prolonged QT interval   . Prostate cancer ([place])    prostate cancer, s/p prostatectomy 04/24/19  . Renal disorder    dialysis  m w f  . Renal transplant recipient / ESRD    ESRD due to HTN > started HD in 2003, got HD in [place] until about 2006-07 when he was discharged from [facility] practice. He then got HD in [facility] until getting a renal transplant in 2009 at [place], [place].  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    . Smoker    tx wellbutriin  . 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 Tachycardia   . Thrombocytopenia ([place])   . Transplant recipient   . Type 2 diabetes mellitus ([place])    type 2 - pt states no meds, diet controlled as of 04/22/20     Social History   Tobacco Use  . Smoking status: Current Every Day Smoker    Packs/day: 0.25    Years: 26.00    Pack years: 6.50    Types: Cigarettes  . Smokeless tobacco: Never Used  Vaping Use  . Vaping Use: Never used  Substance Use Topics  . Alcohol use: No  . Drug use: No    Family History  Adopted: Yes    Allergies  Allergen Reactions  . Tramadol Hcl Shortness Of Breath and Other (See Comments)    Tramadol HCl = Shortness of breath and Tramadol = Rash  . Nsaids Other (See Comments)    Was told by MD to not take these  . Darvon [Propoxyphene] Rash and Other (See Comments)    Broke out on eyelid     Physical Exam: Constitutional: in no apparent distress  Vitals:   08/07/20 0607 08/07/20 1240  BP: (!) 152/87 (!) 155/80  Pulse:  97  Resp: 18 18  Temp: 98.9 F (37.2 C) 98.5 F (36.9 C)  SpO2: 100% 98%   EYES: anicteric Cardiovascular: Cor RRR Respiratory: clear; Musculoskeletal: left BKA; right transmetatarsal amputation site with sutures in place, no current drainage, no warmth, tender to touch.  Skin: negatives: no rash Neuro: non-focal  Lab Results  Component Value Date   WBC 8.4 08/07/2020   HGB 8.7 (L) 08/07/2020   HCT 27.4 (L) 08/07/2020   MCV 103.8 (H) 08/07/2020   PLT 168 08/07/2020    Lab Results  Component Value Date   CREATININE 5.66 (H) 08/07/2020   BUN 22 (H) 08/07/2020   NA 138 08/07/2020   K 3.4 (L) 08/07/2020   CL 96 (L) 08/07/2020   CO2 27 08/07/2020    Lab Results  Component Value Date   ALT 36 08/04/2020   AST 38 08/04/2020   ALKPHOS 182 (H) 08/04/2020     Microbiology: Recent Results (from the past 240 hour(s))  Resp Panel by RT-PCR (Flu A&B, Covid) Nasopharyngeal Swab     Status: None   Collection Time: 08/01/20 12:29 PM   Specimen: Nasopharyngeal Swab; Nasopharyngeal(NP) swabs in vial transport medium  Result Value Ref Range Status   SARS Coronavirus 2 by RT PCR NEGATIVE NEGATIVE Final    Comment:  (NOTE) SARS-CoV-2 target nucleic acids are NOT DETECTED.  The SARS-CoV-2 RNA is generally detectable in upper respiratory specimens during the acute phase of infection. The lowest concentration of SARS-CoV-2 viral copies this assay can detect is 138 copies/mL. A negative result does not preclude SARS-Cov-2 infection and should not be used as the sole basis for treatment or other patient management decisions.  A negative result may occur with  improper specimen collection/handling, submission of specimen other than nasopharyngeal swab, presence of viral mutation(s) within the areas targeted by this assay, and inadequate number of viral copies(<138 copies/mL). A negative result must be combined with clinical observations, patient history, and epidemiological information. The expected result is Negative.  Fact Sheet for Patients:  EntrepreneurPulse.com.au  Fact Sheet for Healthcare Providers:  IncredibleEmployment.be  This test is no t yet approved or cleared by the [place] FDA and  has been authorized for detection and/or diagnosis of SARS-CoV-2 by FDA under an Emergency Use Authorization (EUA). This EUA will remain  in effect (meaning this test can be used) for the duration of the COVID-19 declaration under Section 564(b)(1) of the Act, 21 U.S.C.section 360bbb-3(b)(1), unless the authorization is terminated  or revoked sooner.       Influenza A by PCR NEGATIVE NEGATIVE Final   Influenza B by PCR NEGATIVE NEGATIVE Final    Comment: (NOTE) The Xpert Xpress SARS-CoV-2/FLU/RSV plus assay is intended as an aid in the diagnosis of influenza from Nasopharyngeal swab specimens and should not be used as a sole basis for treatment. Nasal washings and aspirates are unacceptable for Xpert Xpress SARS-CoV-2/FLU/RSV testing.  Fact Sheet for Patients: EntrepreneurPulse.com.au  Fact Sheet for Healthcare  Providers: IncredibleEmployment.be  This test is not yet approved or cleared by the [place] FDA and has been authorized for detection and/or diagnosis of SARS-CoV-2 by FDA under an Emergency Use Authorization (EUA). This EUA will remain in effect (meaning this test can be used) for the duration of the COVID-19 declaration under Section 564(b)(1) of the Act, 21 U.S.C. section 360bbb-3(b)(1), unless the authorization is terminated or revoked.  Performed at [facility], [place] [address], [place], [place] [address]   Culture, blood (routine x 2)     Status: None (Preliminary result)   Collection Time: 08/03/20  4:14 PM   Specimen: BLOOD  Result Value Ref Range Status   Specimen Description BLOOD LEFT ANTECUBITAL  Final   Special Requests   Final    BOTTLES DRAWN AEROBIC AND ANAEROBIC Blood Culture adequate volume   Culture   Final    NO GROWTH 4 DAYS Performed at [facility], [place] [address] [place], [place] [address]    Report Status PENDING  Incomplete  Culture, blood (routine x 2)     Status: None (Preliminary result)   Collection Time: 08/03/20  4:14 PM   Specimen: BLOOD LEFT HAND  Result Value Ref Range Status   Specimen Description BLOOD LEFT HAND  Final   Special Requests   Final    BOTTLES DRAWN AEROBIC AND ANAEROBIC Blood Culture adequate volume   Culture   Final    NO GROWTH 4 DAYS Performed at [facility], [place] [address] [place],  [address]    Report Status PENDING  Incomplete  MRSA PCR Screening     Status: Abnormal   Collection Time: 08/05/20  6:09 AM   Specimen: Nasopharyngeal  Result Value Ref Range Status   MRSA by PCR POSITIVE (A) NEGATIVE Final    Comment:        The GeneXpert MRSA Assay (FDA approved for NASAL specimens only), is one component of a comprehensive MRSA colonization surveillance program. It is not intended to diagnose MRSA infection nor to guide or monitor treatment for MRSA  infections. RESULT CALLED TO, READ BACK BY AND VERIFIED WITH: Aggie Cosier RN 10:00 08/05/20 ([email or web address]) Performed at  [facility], [place] [address] [place], [place] [address]     Robert W Comer, [facility] for Infectious Disease [facility] [email or web address] 08/07/2020, 1:50 PM

## 2020-08-07 NOTE — Evaluation (Signed)
Physical Therapy Evaluation Patient Details Name: James [name] MRN: [id number] DOB: 1971-02-10 Today's Date: 08/07/2020   History of Present Illness  Pt adm with acute encephalopathy. Pt with NSTEMI and new systolic heart failure. PMH - lt transmet amputation 06/2020,rt bka, ESRD on HD, lymphoma, HTN, prostate CA, DM, PVD, failed kidney transplant.  Clinical Impression  Pt presents to PT with deficits in strength, power, functional mobility, and with pain. Pt mobilizes in bed well but is limited by LLE pain and refuses transfer attempts at this time. Pt has limited caregiver support, only reporting PCA for 2 hours 3x/week. Pt will need to transfer independently via lateral scoot or anterior posterior transfer, and mobilize at a modI level in a manual wheelchair to be able to manage at home. As the pt has increased pain and is unable to demonstrate successful transfers at this time PT recommend SNF placement to provide further mobility training and to allow for increased assistance at the time of discharge.    Follow Up Recommendations SNF;Supervision/Assistance - 24 hour    Equipment Recommendations  Other (comment) (sliding board)    Recommendations for Other Services       Precautions / Restrictions Precautions Precautions: Fall Precaution Comments: LLE NWB Restrictions Weight Bearing Restrictions: Yes LLE Weight Bearing: Non weight bearing Other Position/Activity Restrictions: also old R BKA      Mobility  Bed Mobility Overal bed mobility: Needs Assistance Bed Mobility: Supine to Sit;Sit to Supine     Supine to sit: Supervision Sit to supine: Supervision        Transfers                 General transfer comment: pt defers transfer assessment at this time 2/2 pain  Ambulation/Gait                Stairs            Wheelchair Mobility    Modified Rankin (Stroke Patients Only)       Balance Overall balance assessment: Needs  assistance Sitting-balance support: Single extremity supported;Bilateral upper extremity supported;Feet unsupported Sitting balance-Leahy Scale: Poor Sitting balance - Comments: reliant on UE support of bed                                     Pertinent Vitals/Pain Pain Assessment: Faces Faces Pain Scale: Hurts whole lot Pain Location: LLE when in dependent position Pain Descriptors / Indicators: Grimacing Pain Intervention(s): Monitored during session    Home Living Family/patient expects to be discharged to:: Private residence Living Arrangements: Alone Available Help at Discharge: Personal care attendant Type of Home: House Home Access: Ramped entrance     Home Layout: Two level;Able to live on main level with bedroom/bathroom Home Equipment: Gilford Rile - 2 wheels;Bedside commode;Wheelchair - manual;Tub bench      Prior Function Level of Independence: Needs assistance   Gait / Transfers Assistance Needed: Wheelchair in home and community dwellings. Prior to transmet amp 07/21/20 patient was hopping household distances with use of RW.  ADL's / Homemaking Assistance Needed: Some assist from PCA for bathing/dressing and household management. PCA 3x weekly for 2hr.        Hand Dominance   Dominant Hand: Right    Extremity/Trunk Assessment   Upper Extremity Assessment Upper Extremity Assessment: Overall WFL for tasks assessed    Lower Extremity Assessment Lower Extremity Assessment: Generalized weakness (4/5 grossly)  Cervical / Trunk Assessment Cervical / Trunk Assessment: Normal  Communication   Communication: No difficulties  Cognition Arousal/Alertness: Awake/alert Behavior During Therapy: WFL for tasks assessed/performed Overall Cognitive Status: Within Functional Limits for tasks assessed                                 General Comments: cognition appears [facility] although pt providing differing home setup from when with OT       General Comments General comments (skin integrity, edema, etc.): VSS on RA    Exercises     Assessment/Plan    PT Assessment Patient needs continued PT services  PT Problem List Decreased strength;Decreased activity tolerance;Decreased balance;Decreased mobility;Decreased cognition;Decreased knowledge of use of DME;Decreased safety awareness;Decreased knowledge of precautions;Cardiopulmonary status limiting activity;Pain       PT Treatment Interventions DME instruction;Functional mobility training;Therapeutic activities;Therapeutic exercise;Balance training;Neuromuscular re-education;Cognitive remediation;Patient/family education;Wheelchair mobility training    PT Goals (Current goals can be found in the Care Plan section)  Acute Rehab PT Goals Patient Stated Goal: to go to rehab to allow for increased assistance to mobilize while transmet heals PT Goal Formulation: With patient Time For Goal Achievement: 08/21/20 Potential to Achieve Goals: Good Additional Goals Additional Goal #1: Pt will mobilize in manual wheelchair for 200' at a modI level    Frequency Min 3X/week   Barriers to discharge Decreased caregiver support      Co-evaluation               AM-PAC PT "6 Clicks" Mobility  Outcome Measure Help needed turning from your back to your side while in a flat bed without using bedrails?: A Little Help needed moving from lying on your back to sitting on the side of a flat bed without using bedrails?: A Little Help needed moving to and from a bed to a chair (including a wheelchair)?: A Lot Help needed standing up from a chair using your arms (e.g., wheelchair or bedside chair)?: A Lot Help needed to walk in hospital room?: A Lot Help needed climbing 3-5 steps with a railing? : A Lot 6 Click Score: 14    End of Session   Activity Tolerance: Patient limited by pain Patient left: in bed;with call bell/phone within reach;with bed alarm set Nurse Communication:  Mobility status PT Visit Diagnosis: Other abnormalities of gait and mobility (R26.89);Pain Pain - Right/Left: Left Pain - part of body: Ankle and joints of foot    Time: 1352-1404 PT Time Calculation (min) (ACUTE ONLY): 12 min   Charges:   PT Evaluation $PT Eval Moderate Complexity: 1 Mod          Zenaida Niece, PT, DPT Acute Rehabilitation Pager: [phone]   Zenaida Niece 08/07/2020, 2:39 PM

## 2020-08-07 NOTE — Progress Notes (Signed)
Late Entry of Hypoglycemic Event  CBG: 62   Treatment: 4 oz juice/soda  Symptoms: None  Follow-up CBG: Time:2205 CBG Result:83  Possible Reasons for Event: Pt was not checked in HD  Rip Harbour, SWOT RN assessed CBG and treated patient. Patient was given 4 oz of gingerale and his HS renal snack    Dequon Schnebly, [place]

## 2020-08-07 NOTE — Progress Notes (Signed)
[place] for IV heparin Indication: history of splenic infarct  Allergies  Allergen Reactions  . Tramadol Hcl Shortness Of Breath and Other (See Comments)    Tramadol HCl = Shortness of breath and Tramadol = Rash  . Nsaids Other (See Comments)    Was told by MD to not take these  . Darvon [Propoxyphene] Rash and Other (See Comments)    Broke out on eyelid     Patient Measurements: Weight: 66.1 kg (145 lb 11.6 oz) Heparin Dosing Weight: 65.5 kg  Vital Signs: Temp: 98.9 F (37.2 C) (01/16 0607) Temp Source: Oral (01/16 0607) BP: 152/87 (01/16 0607)  Labs: Recent Labs    08/04/20 1239 08/05/20 0225 08/05/20 0225 08/06/20 0307 08/06/20 0825 08/07/20 0001 08/07/20 1002  HGB  --  7.5*   < > 7.0*  --  8.7*  --   HCT  --  24.3*  --  22.5*  --  27.4*  --   PLT  --  169  --  145*  --  168  --   LABPROT 17.5*  --   --  14.6  --   --   --   INR 1.5*  --   --  1.2  --   --   --   HEPARINUNFRC  --   --   --   --   --  1.00* <0.10*  CREATININE  --  7.99*  --   --  10.63* 5.66*  --    < > = values in this interval not displayed.    Estimated Creatinine Clearance: 15.1 mL/min (A) (by C-G formula based on SCr of 5.66 mg/dL (H)).   Medical History: Past Medical History:  Diagnosis Date  . Anemia   . Avascular necrosis ([place])   . BPH (benign prostatic hyperplasia)   . Complication of anesthesia    slow to wake up  . ESRD (end stage renal disease) ([place])    failed kidney transplant  . GERD (gastroesophageal reflux disease)   . Glaucoma   . History of blood transfusion   . Hypertension   . Hypertriglyceridemia   . Influenza A   . 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 Pneumonia 04/21/2017   several times per pt on 04/22/20  . Prolonged QT interval   . Prostate cancer ([place])     prostate cancer, s/p prostatectomy 04/24/19  . Renal disorder    dialysis m w f  . 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 He then got HD in [facility] until getting a renal transplant in 2009 at [place], [place].  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    . Smoker    tx wellbutriin  . 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 Tachycardia   . Thrombocytopenia ([place])   . Transplant recipient   . Type 2 diabetes mellitus ([place])    type 2 - pt states no meds, diet controlled as of 04/22/20    Medications:  Medications Prior  to Admission  Medication Sig Dispense Refill Last Dose  . acetaminophen (TYLENOL) 500 MG tablet Take 500-1,000 mg by mouth every 6 (six) hours as needed (pain or headaches).    Past Week at Unknown time  . amLODipine (NORVASC) 10 MG tablet Take 1 tablet (10 mg total) by mouth daily. 30 tablet 0 Past Month at Unknown time  . aspirin EC 81 MG tablet Take 81 mg by mouth daily. Swallow whole.   Past Month at Unknown time  . diphenhydrAMINE (BENADRYL) 25 mg capsule Take 25 mg by mouth every 6 (six) hours as needed for itching.    unk  . warfarin (COUMADIN) 5 MG tablet Take 5-7.5 mg by mouth See admin instructions. Take 1.5 tablets (7.5 mg) by mouth in the evenings on Mondays, Wednesdays, & Fridays. Take 1 tablet (5 mg) by mouth in the evening on Sundays, Tuesdays, Thursdays, & Saturdays.   07/19/2020  . buPROPion (WELLBUTRIN XL) 150 MG 24 hr tablet Take 150 mg by mouth daily.     . Cyanocobalamin (B-12) 500 MCG TABS Take 500 mcg by mouth daily.      Marland Kitchen doxazosin (CARDURA) 2 MG tablet Take 2 mg by mouth at bedtime.     . ferric citrate (AURYXIA) 1 GM 210 MG(Fe) tablet Take 210-420 mg by mouth See admin instructions. Take 2 tablets (420 mg) by  mouth up to three times daily with meals and 1 tablet (210 mg) with snacks     . gabapentin (NEURONTIN) 100 MG capsule Take 100 mg by mouth at bedtime.     . insulin aspart (NOVOLOG FLEXPEN) 100 UNIT/ML FlexPen Inject 10 Units into the skin 3 (three) times daily with meals. (Patient taking differently: Inject 2 Units into the skin 3 (three) times daily as needed for high blood sugar (CBG >180).) 15 mL 11   . Insulin Detemir (LEVEMIR FLEXTOUCH) 100 UNIT/ML Pen Inject 15 Units into the skin at bedtime. (Patient not taking: Reported on 08/01/2020) 15 mL 11 Not Taking at Unknown time  . latanoprost (XALATAN) 0.005 % ophthalmic solution Place 1 drop into both eyes at bedtime.      Marland Kitchen lisinopril (ZESTRIL) 10 MG tablet Take 10 mg by mouth at bedtime.     Marland Kitchen loperamide (IMODIUM) 2 MG capsule Take 2 mg by mouth as needed for diarrhea or loose stools.      . Melatonin 5 MG CAPS Take 5 mg by mouth at bedtime as needed (sleep).     . metoprolol tartrate (LOPRESSOR) 100 MG tablet Take 100 mg by mouth 2 (two) times daily.      Marland Kitchen omeprazole (PRILOSEC) 20 MG capsule Take 20 mg by mouth daily.      Marland Kitchen oxyCODONE (OXY IR/ROXICODONE) 5 MG immediate release tablet Take 5 mg by mouth every 6 (six) hours as needed (pain).     Marland Kitchen oxyCODONE-acetaminophen (PERCOCET) 5-325 MG tablet Take 1 tablet by mouth every 6 (six) hours as needed. 20 tablet 0   . predniSONE (DELTASONE) 5 MG tablet Take 5 mg by mouth daily with breakfast.      . tacrolimus (PROGRAF) 0.5 MG capsule Take 0.5 mg by mouth daily.  (Patient not taking: Reported on 08/02/2020)   Not Taking at Unknown time  . triamcinolone cream (KENALOG) 0.1 % Apply 1 application topically daily. (dermatitis) After bathing     . valGANciclovir (VALCYTE) 450 MG tablet Take 450 mg by mouth every Monday, Wednesday, and Friday with hemodialysis.   5   .  zolpidem (AMBIEN) 10 MG tablet Take 10 mg by mouth at bedtime as needed for sleep.        Assessment: 50 year old male admitted for  acute hypoxic respiratory failure with PMH of ESRD on hemodialysis, history of failed transplant, history of lymphoma, hypertension, BPH, prostate cancer, type 2 diabetes, peripheral vascular disease, right BKA and recent left TMA. Pharmacy consulted to start IV heparin for history of splenic infarct.  Prior to admission warfarin for history of splenic infarct. Home regimen 7.5 mg on MWF and 5 mg rest of week, last dose 07/19/20.  Warfarin held since admission due to FOBT positive and suspected bleed. Workup with colonoscopy and EGD, cleared to resume anticoagulation per GI.   Heparin level is <0.1 after rate decreased from 900 to 700 units/hr. Per RN, no issues or interruptions with infusion. Ordered repeat heparin level to verify, however not yet drawn 2 hours later, called phlebotomy x2 but unable to reach anyone. Given delay, will increase rate slightly without bolus and recheck in 8 hours.  Hgb 8.7 today, platelets wnl.  Goal of Therapy:  Heparin level 0.3-0.7 units/ml Monitor platelets by anticoagulation protocol: Yes   Plan:  Increase IV heparin to 800 units/hr Heparin level in 8 hours Daily heparin level, CBC Monitor s/sx bleeding F/u transition back to warfarin after procedures  Fara Olden, PharmD PGY-1 Pharmacy Resident 08/07/2020 11:14 AM Please see AMION for all pharmacy numbers

## 2020-08-07 NOTE — Progress Notes (Signed)
[facility] Progress Note    Assessment/ Plan:   ESRD:  -h/o failed txp -outpatient orders [facility]: F200, 3.5HRS, 350/700. 2K/2.5Ca. Heparin 1k units bolus -maintain mwf here, back on schedule tomorrow -no heparin here -continue with home pred and tacrolimus  NSTEMI -appreciate cardiology -ischemic workup on hold given possible infection and blood loss anemia  New CHF -ef now 35-40%, cardiology on board. UF as tolerated  Tachyarrhythmia, improved. Sinus tach -rapid response called in dialysis unit 1/12, held further UF, improved with valsalva. Looked like SVT initially on tele w/ HR up to the 180s, sinus tach on ekg. -needs to get BB prior to HD  Leukocytosis/fevers -avf looks good, likely not a source -questionable OM however per ortho consistent with post op changes -abx per primary service  Volume/ hypertension/pulmonary edema: -edw 69kg, UF as tolerated. Resume home anti-htns. SOB resolved  Anemia secondary to Chronic Kidney Disease and blood loss: -aranesp 69mcg every Wed outpatient. Increased aranesp to 118mcg, dose to be given monday -venofer once every 2 weeks. Iron panel ordered, holding on starting iron given infection eval -1u prbc on 1/11 -stool occult positive, GI on board, cscope and egd 1/14 -macrocytic, b12 high, folate ok -transfuse prn for hgb <7  Secondary Hyperparathyroidism/Hyperphosphatemia: resume home binders (on renvela), monitor phos and PTH  -hectorol 4.74mcg qtreatment  AMS/encephalopathy -persistent despite HD, possibly another etiology rather than uremic encephalopathy. Mgmt per primary service. Sepsis?  Splenic infarct -a/c per primary service, inr at goal  Vascular access: rue avf +b/t  # Additional recommendations: - Dose all meds for creatinine clearance <10 ml/min  - Unless absolutely necessary, no MRIs with gadolinium.  - Implement save arm precautions. Prefer needle sticks in the dorsum of  the hands or wrists. No blood pressure measurements in arm. - If blood transfusion is requested during hemodialysis sessions, please alert [place] prior to the session.  - If a hemodialysis catheter line culture is requested, please alert [place] as only hemodialysis nurses are able to collect those specimens.   Gean Quint, MD [facility]  Subjective:   No acute events, no complaints. Tolerated hd yesterday, net uf 3L   Objective:   BP (!) 152/87 (BP Location: Left Arm)   Pulse 98   Temp 98.9 F (37.2 C) (Oral)   Resp 18   Wt 66.1 kg   SpO2 100%   BMI 22.82 kg/m   Intake/Output Summary (Last 24 hours) at 08/07/2020 1221 Last data filed at 08/07/2020 0315 Gross per 24 hour  Intake 748.24 ml  Output 3000 ml  Net -2251.76 ml   Weight change: -1.6 kg  Physical Exam: Gen:nad, laying flat in bed CVS:reg rate Resp: unlabored, no iwob JQZ:ESPQ, nt/nd Ext:no edema Neuro: speech clear and coherent, moves all ext spontaneously, fatigued Access: rue avf +b/t  Imaging: MR FOOT LEFT WO CONTRAST  Result Date: 08/05/2020 CLINICAL DATA:  Foot swelling. Diabetic. History of transmetatarsal left foot amputation EXAM: MRI OF THE LEFT FOOT WITHOUT CONTRAST TECHNIQUE: Multiplanar, multisequence MR imaging of the left foot was performed. No intravenous contrast was administered. COMPARISON:  X-ray 07/21/2020 FINDINGS: Status post transmetatarsal amputation of the first-fifth rays at the level of the proximal to mid diaphysis. Ill-defined fluid within the soft tissues at the distal stump approximating the resection margins. There is extension of fluid signal into the marrow spaces of the second, third, fourth, and fifth metatarsals at their resection margins with corresponding low T1 signal (series 9, images 15-22; series 8, images  10-16). Marrow signal changes involve less than 1.0 cm of each of the involved metatarsal diaphyses. The first metatarsal resection margin is preserved without  erosion or marrow signal changes. Remaining marrow structures of the midfoot and hindfoot are within normal limits. No fracture or malalignment. Trace tibiotalar and subtalar joint effusions are nonspecific. Subtle areas of skin irregularity along the more dorsal aspect of the distal stump which may reflect incision site or superficial ulceration. Area of ill-defined fluid within the soft tissues predominantly surrounding the second, third, and fourth metatarsal resection margins measures approximately 4.0 x 1.5 x 2.5 cm (series 6, image 49; series 9, image 15). There is dorsal subcutaneous edema. Moderate volume tenosynovial fluid associated with the flexor digitorum tendons tracking to the level of the midfoot (series 6, image 38; series 9, image 12). Mild diffuse edema-like signal throughout the intrinsic foot musculature suggesting a nonspecific myositis. IMPRESSION: 1. Status post transmetatarsal amputation of the first-fifth rays. Marrow signal changes within the second, third, fourth, and fifth metatarsals at their resection margins are compatible with acute osteomyelitis. 2. Ill-defined fluid collection at the distal stump centered around the second-fourth metatarsal resection margins measuring up to 4.0 cm suggestive of a phlegmon/developing abscess. 3. Moderate volume tenosynovial fluid associated with the flexor digitorum tendons tracking to the level of the midfoot. Electronically Signed   By: Davina Poke D.O.   On: 08/05/2020 16:16    Labs: BMET Recent Labs  Lab 08/02/20 0628 08/03/20 0249 08/03/20 1248 08/04/20 0249 08/05/20 0225 08/06/20 0825 08/07/20 0001  NA 135 135 136 139 138 137 138  K 5.3* 4.2 2.9* 4.2 3.9 3.8 3.4*  CL 95* 92* 95* 97* 96* 98 96*  CO2 20* 25 27 28 23  18* 27  GLUCOSE 81 85 87 86 69* 88 131*  BUN 82* 41* 10 17 31* 49* 22*  CREATININE 14.21* 8.80* 2.94* 5.33* 7.99* 10.63* 5.66*  CALCIUM 9.4 9.5 9.2 9.6 10.0 9.5 9.1  PHOS 5.7* 4.5 1.9*  --   --   --   --     CBC Recent Labs  Lab 08/04/20 0249 08/05/20 0225 08/06/20 0307 08/07/20 0001  WBC 8.2 8.8 7.2 8.4  HGB 7.4* 7.5* 7.0* 8.7*  HCT 22.7* 24.3* 22.5* 27.4*  MCV 108.1* 111.0* 110.3* 103.8*  PLT 160 169 145* 168    Medications:    . sodium chloride   Intravenous Once  . sodium chloride   Intravenous Once  . amLODipine  10 mg Oral Daily  . Chlorhexidine Gluconate Cloth  6 each Topical Q0600  . [START ON 08/08/2020] darbepoetin (ARANESP) injection - DIALYSIS  100 mcg Intravenous Q Mon-HD  . insulin aspart  0-6 Units Subcutaneous TID WC  . lidocaine  1 patch Transdermal Q24H  . metoprolol tartrate  50 mg Oral Q6H  . mupirocin ointment  1 application Nasal BID  . predniSONE  5 mg Oral Q breakfast  . tacrolimus  0.5 mg Oral Daily      Gean Quint, MD [facility] 08/07/2020, 12:21 PM

## 2020-08-07 NOTE — Progress Notes (Signed)
[place] for heparin Indication: history of splenic infarct  Allergies  Allergen Reactions  . Tramadol Hcl Shortness Of Breath and Other (See Comments)    Tramadol HCl = Shortness of breath and Tramadol = Rash  . Nsaids Other (See Comments)    Was told by MD to not take these  . Darvon [Propoxyphene] Rash and Other (See Comments)    Broke out on eyelid     Patient Measurements: Weight: 61.5 kg (135 lb 9.3 oz) Heparin Dosing Weight: 65.5 kg  Vital Signs: Temp: 99 F (37.2 C) (01/15 2028) Temp Source: Oral (01/15 2028) BP: 157/84 (01/15 2028) Pulse Rate: 98 (01/15 1630)  Labs: Recent Labs    08/04/20 1239 08/05/20 0225 08/06/20 0307 08/06/20 0825 08/07/20 0001  HGB  --  7.5* 7.0*  --  8.7*  HCT  --  24.3* 22.5*  --  27.4*  PLT  --  169 145*  --  168  LABPROT 17.5*  --  14.6  --   --   INR 1.5*  --  1.2  --   --   HEPARINUNFRC  --   --   --   --  1.00*  CREATININE  --  7.99*  --  10.63* 5.66*    Estimated Creatinine Clearance: 14 mL/min (A) (by C-G formula based on SCr of 5.66 mg/dL (H)).   Assessment: 50 y.o. male with h/o splenic infarction, Coumadin on hold, for heparin  Goal of Therapy:  Heparin level 0.3-0.7 units/ml Monitor platelets by anticoagulation protocol: Yes   Plan:  Decrease Heparin 700 units/hr Check heparin level in 8 hours.   Phillis Knack, PharmD, BCPS

## 2020-08-08 ENCOUNTER — Encounter (HOSPITAL_COMMUNITY): Payer: Self-pay | Admitting: Gastroenterology

## 2020-08-08 ENCOUNTER — Encounter (HOSPITAL_COMMUNITY)
Admission: EM | Disposition: A | Discharge status: Home Health | Payer: Self-pay | Source: Home / Self Care | Attending: Internal Medicine

## 2020-08-08 DIAGNOSIS — L02612 Cutaneous abscess of left foot: Secondary | ICD-10-CM | POA: Diagnosis not present

## 2020-08-08 DIAGNOSIS — N186 End stage renal disease: Secondary | ICD-10-CM | POA: Diagnosis not present

## 2020-08-08 DIAGNOSIS — I5020 Unspecified systolic (congestive) heart failure: Secondary | ICD-10-CM

## 2020-08-08 DIAGNOSIS — T8744 Infection of amputation stump, left lower extremity: Secondary | ICD-10-CM

## 2020-08-08 DIAGNOSIS — R000 Tachycardia, unspecified: Secondary | ICD-10-CM | POA: Diagnosis not present

## 2020-08-08 DIAGNOSIS — Z940 Kidney transplant status: Secondary | ICD-10-CM

## 2020-08-08 DIAGNOSIS — D509 Iron deficiency anemia, unspecified: Secondary | ICD-10-CM | POA: Diagnosis not present

## 2020-08-08 DIAGNOSIS — J810 Acute pulmonary edema: Secondary | ICD-10-CM | POA: Diagnosis not present

## 2020-08-08 DIAGNOSIS — G9340 Encephalopathy, unspecified: Secondary | ICD-10-CM | POA: Diagnosis not present

## 2020-08-08 DIAGNOSIS — I214 Non-ST elevation (NSTEMI) myocardial infarction: Secondary | ICD-10-CM

## 2020-08-08 DIAGNOSIS — M86172 Other acute osteomyelitis, left ankle and foot: Secondary | ICD-10-CM

## 2020-08-08 LAB — SEDIMENTATION RATE: Sed Rate: >140 mm/hr — ABNORMAL HIGH (ref 0–16)

## 2020-08-08 LAB — GLUCOSE, CAPILLARY
Glucose-Capillary: 84 mg/dL (ref 70–99)
Glucose-Capillary: 171 mg/dL — ABNORMAL HIGH (ref 70–99)
Glucose-Capillary: 82 mg/dL (ref 70–99)
Glucose-Capillary: 86 mg/dL (ref 70–99)

## 2020-08-08 LAB — HEPARIN LEVEL (UNFRACTIONATED)
Heparin Unfractionated: <0.1 IU/mL — ABNORMAL LOW (ref 0.30–0.70)
Heparin Unfractionated: <0.1 IU/mL — ABNORMAL LOW (ref 0.30–0.70)
Heparin Unfractionated: <0.1 IU/mL — ABNORMAL LOW (ref 0.30–0.70)

## 2020-08-08 LAB — LIPID PANEL
Cholesterol: 85 mg/dL (ref 0–200)
HDL: 17 mg/dL — ABNORMAL LOW (ref >40)
LDL Cholesterol: 36 mg/dL (ref 0–99)
Total CHOL/HDL Ratio: 5 RATIO
Triglycerides: 160 mg/dL — ABNORMAL HIGH (ref <150)
VLDL: 32 mg/dL (ref 0–40)

## 2020-08-08 LAB — CULTURE, BLOOD (ROUTINE X 2)
Culture: NO GROWTH
Culture: NO GROWTH
Special Requests: ADEQUATE
Special Requests: ADEQUATE

## 2020-08-08 LAB — RENAL FUNCTION PANEL
Albumin: 2.3 g/dL — ABNORMAL LOW (ref 3.5–5.0)
Anion gap: 16 — ABNORMAL HIGH (ref 5–15)
BUN: 43 mg/dL — ABNORMAL HIGH (ref 6–20)
CO2: 23 mmol/L (ref 22–32)
Calcium: 10 mg/dL (ref 8.9–10.3)
Chloride: 97 mmol/L — ABNORMAL LOW (ref 98–111)
Creatinine, Ser: 9.35 mg/dL — ABNORMAL HIGH (ref 0.61–1.24)
GFR, Estimated: 6 mL/min — ABNORMAL LOW (ref >60)
Glucose, Bld: 86 mg/dL (ref 70–99)
Phosphorus: 5.6 mg/dL — ABNORMAL HIGH (ref 2.5–4.6)
Potassium: 3.7 mmol/L (ref 3.5–5.1)
Sodium: 136 mmol/L (ref 135–145)

## 2020-08-08 LAB — HEMOGLOBIN A1C
Hgb A1c MFr Bld: 4.8 % (ref 4.8–5.6)
Mean Plasma Glucose: 91.06 mg/dL

## 2020-08-08 LAB — CBC
HCT: 26 % — ABNORMAL LOW (ref 39.0–52.0)
Hemoglobin: 8.3 g/dL — ABNORMAL LOW (ref 13.0–17.0)
MCH: 33.6 pg (ref 26.0–34.0)
MCHC: 31.9 g/dL (ref 30.0–36.0)
MCV: 105.3 fL — ABNORMAL HIGH (ref 80.0–100.0)
Platelets: 142 10*3/uL — ABNORMAL LOW (ref 150–400)
RBC: 2.47 MIL/uL — ABNORMAL LOW (ref 4.22–5.81)
RDW: 17 % — ABNORMAL HIGH (ref 11.5–15.5)
WBC: 8.8 10*3/uL (ref 4.0–10.5)
nRBC: 0 % (ref 0.0–0.2)

## 2020-08-08 LAB — PATHOLOGIST SMEAR REVIEW

## 2020-08-08 SURGERY — LEFT HEART CATH AND CORONARY ANGIOGRAPHY
Anesthesia: LOCAL

## 2020-08-08 MED ORDER — VANCOMYCIN HCL IN DEXTROSE 750-5 MG/150ML-% IV SOLN
INTRAVENOUS | Status: AC
  Administered 2020-08-08: 750 mg
  Filled 2020-08-08: qty 150

## 2020-08-08 MED ORDER — SODIUM CHLORIDE 0.9 % IV SOLN: 250.0000 mL | INTRAVENOUS | Status: DC | PRN

## 2020-08-08 MED ORDER — OXYCODONE-ACETAMINOPHEN 5-325 MG PO TABS
ORAL_TABLET | ORAL | Status: AC
  Filled 2020-08-08: qty 1

## 2020-08-08 MED ORDER — SODIUM CHLORIDE 0.9% FLUSH
3.0000 mL | Freq: Two times a day (BID) | INTRAVENOUS | Status: DC
Start: 2020-08-08 — End: 2020-08-11
  Administered 2020-08-09: 3 mL via INTRAVENOUS

## 2020-08-08 MED ORDER — DARBEPOETIN ALFA 100 MCG/0.5ML IJ SOSY
PREFILLED_SYRINGE | INTRAMUSCULAR | Status: AC
  Administered 2020-08-08: 100 ug via INTRAVENOUS
  Filled 2020-08-08: qty 0.5

## 2020-08-08 MED ORDER — SODIUM CHLORIDE 0.9 % IV SOLN
2.0000 g | INTRAVENOUS | Status: DC
  Administered 2020-08-08: 2 g via INTRAVENOUS
  Filled 2020-08-08 (×2): qty 2

## 2020-08-08 MED ORDER — SODIUM CHLORIDE 0.9 % IV SOLN: INTRAVENOUS | Status: DC

## 2020-08-08 MED ORDER — ASPIRIN 81 MG PO CHEW
81.0000 mg | CHEWABLE_TABLET | ORAL | Status: AC
  Administered 2020-08-09: 81 mg via ORAL
  Filled 2020-08-08: qty 1

## 2020-08-08 MED ORDER — SODIUM CHLORIDE 0.9% FLUSH
3.0000 mL | INTRAVENOUS | Status: DC | PRN
  Administered 2020-08-09: 3 mL via INTRAVENOUS

## 2020-08-08 MED ORDER — CARVEDILOL 12.5 MG PO TABS
12.5000 mg | ORAL_TABLET | Freq: Two times a day (BID) | ORAL | Status: DC
  Administered 2020-08-08: 12.5 mg via ORAL
  Filled 2020-08-08: qty 1

## 2020-08-08 NOTE — Progress Notes (Signed)
[place] for IV heparin Indication: history of splenic infarct  Allergies  Allergen Reactions  . Tramadol Hcl Shortness Of Breath and Other (See Comments)    Tramadol HCl = Shortness of breath and Tramadol = Rash  . Nsaids Other (See Comments)    Was told by MD to not take these  . Darvon [Propoxyphene] Rash and Other (See Comments)    Broke out on eyelid     Patient Measurements: Height: 5\' 9"  (175.3 cm) Weight: 62 kg (136 lb 11 oz) IBW/kg (Calculated) : 70.7 Heparin Dosing Weight: 65.5 kg  Vital Signs: Temp: 98.5 F (36.9 C) (01/17 2053) Temp Source: Oral (01/17 2053) BP: 121/94 (01/17 2053) Pulse Rate: 111 (01/17 2053)  Labs: Recent Labs    08/06/20 0307 08/06/20 0825 08/07/20 0001 08/07/20 1002 08/07/20 2243 08/08/20 0800 08/08/20 2206  HGB 7.0*  --  8.7*  --   --  8.3*  --   HCT 22.5*  --  27.4*  --   --  26.0*  --   PLT 145*  --  168  --   --  142*  --   LABPROT 14.6  --   --   --   --   --   --   INR 1.2  --   --   --   --   --   --   HEPARINUNFRC  --   --  1.00*   < > <0.10* <0.10* <0.10*  CREATININE  --  10.63* 5.66*  --   --  9.35*  --    < > = values in this interval not displayed.    Estimated Creatinine Clearance: 8.6 mL/min (A) (by C-G formula based on SCr of 9.35 mg/dL (H)).   Medical History: Past Medical History:  Diagnosis Date  . Anemia   . Avascular necrosis ([place])   . BPH (benign prostatic hyperplasia)   . Complication of anesthesia    slow to wake up  . ESRD (end stage renal disease) ([place])    failed kidney transplant  . GERD (gastroesophageal reflux disease)   . Glaucoma   . History of blood transfusion   . Hypertension   . Hypertriglyceridemia   . Influenza A   . 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 Pneumonia 04/21/2017   several  times per pt on 04/22/20  . Prolonged QT interval   . Prostate cancer ([place])    prostate cancer, s/p prostatectomy 04/24/19  . Renal disorder    dialysis m w f  . 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 He then got HD in [facility] until getting a renal transplant in 2009 at [place], [place].  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    . Smoker    tx wellbutriin  . 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 Tachycardia   . Thrombocytopenia ([place])   . Transplant recipient   . Type 2 diabetes mellitus ([facility])    type 2 - pt states  no meds, diet controlled as of 04/22/20    Medications:  Medications Prior to Admission  Medication Sig Dispense Refill Last Dose  . acetaminophen (TYLENOL) 500 MG tablet Take 500-1,000 mg by mouth every 6 (six) hours as needed (pain or headaches).    Past Week at Unknown time  . amLODipine (NORVASC) 10 MG tablet Take 1 tablet (10 mg total) by mouth daily. 30 tablet 0 Past Month at Unknown time  . aspirin EC 81 MG tablet Take 81 mg by mouth daily. Swallow whole.   Past Month at Unknown time  . diphenhydrAMINE (BENADRYL) 25 mg capsule Take 25 mg by mouth every 6 (six) hours as needed for itching.    unk  . warfarin (COUMADIN) 5 MG tablet Take 5-7.5 mg by mouth See admin instructions. Take 1.5 tablets (7.5 mg) by mouth in the evenings on Mondays, Wednesdays, & Fridays. Take 1 tablet (5 mg) by mouth in the evening on Sundays, Tuesdays, Thursdays, & Saturdays.   07/19/2020  . buPROPion (WELLBUTRIN XL) 150 MG 24 hr tablet Take 150 mg by mouth daily.     . Cyanocobalamin (B-12) 500 MCG TABS Take 500 mcg by mouth daily.      Marland Kitchen doxazosin (CARDURA) 2 MG tablet Take 2 mg by mouth at bedtime.     . ferric citrate (AURYXIA) 1 GM 210 MG(Fe)  tablet Take 210-420 mg by mouth See admin instructions. Take 2 tablets (420 mg) by mouth up to three times daily with meals and 1 tablet (210 mg) with snacks     . gabapentin (NEURONTIN) 100 MG capsule Take 100 mg by mouth at bedtime.     . insulin aspart (NOVOLOG FLEXPEN) 100 UNIT/ML FlexPen Inject 10 Units into the skin 3 (three) times daily with meals. (Patient taking differently: Inject 2 Units into the skin 3 (three) times daily as needed for high blood sugar (CBG >180).) 15 mL 11   . Insulin Detemir (LEVEMIR FLEXTOUCH) 100 UNIT/ML Pen Inject 15 Units into the skin at bedtime. (Patient not taking: Reported on 08/01/2020) 15 mL 11 Not Taking at Unknown time  . latanoprost (XALATAN) 0.005 % ophthalmic solution Place 1 drop into both eyes at bedtime.      Marland Kitchen lisinopril (ZESTRIL) 10 MG tablet Take 10 mg by mouth at bedtime.     Marland Kitchen loperamide (IMODIUM) 2 MG capsule Take 2 mg by mouth as needed for diarrhea or loose stools.      . Melatonin 5 MG CAPS Take 5 mg by mouth at bedtime as needed (sleep).     . metoprolol tartrate (LOPRESSOR) 100 MG tablet Take 100 mg by mouth 2 (two) times daily.      Marland Kitchen omeprazole (PRILOSEC) 20 MG capsule Take 20 mg by mouth daily.      Marland Kitchen oxyCODONE (OXY IR/ROXICODONE) 5 MG immediate release tablet Take 5 mg by mouth every 6 (six) hours as needed (pain).     Marland Kitchen oxyCODONE-acetaminophen (PERCOCET) 5-325 MG tablet Take 1 tablet by mouth every 6 (six) hours as needed. 20 tablet 0   . predniSONE (DELTASONE) 5 MG tablet Take 5 mg by mouth daily with breakfast.      . tacrolimus (PROGRAF) 0.5 MG capsule Take 0.5 mg by mouth daily.  (Patient not taking: Reported on 08/02/2020)   Not Taking at Unknown time  . triamcinolone cream (KENALOG) 0.1 % Apply 1 application topically daily. (dermatitis) After bathing     . valGANciclovir (VALCYTE) 450 MG tablet Take 450 mg by  mouth every Monday, Wednesday, and Friday with hemodialysis.   5   . zolpidem (AMBIEN) 10 MG tablet Take 10 mg by mouth at  bedtime as needed for sleep.        Assessment: 50 year old male admitted for acute hypoxic respiratory failure with PMH of ESRD on hemodialysis, history of failed transplant, history of lymphoma, hypertension, BPH, prostate cancer, type 2 diabetes, peripheral vascular disease, right BKA and recent left TMA. Pharmacy consulted to start IV heparin for history of splenic infarct.  Prior to admission warfarin for history of splenic infarct. Home regimen 7.5 mg on MWF and 5 mg rest of week, last dose 07/19/20.  Warfarin held since admission due to FOBT positive and suspected bleed. Workup with colonoscopy and EGD, cleared to resume anticoagulation per GI.   Heparin level is still undetectable <0.1 after rate increased to 900 units/hr. No bleeding or infusion issues noted.   Hgb 8.3 today, platelets wnl.  1/17 PM update:  Heparin level low No issues per RN  Goal of Therapy:  Heparin level 0.3-0.7 units/ml Monitor platelets by anticoagulation protocol: Yes   Plan:  Increase IV heparin to 1300 units/hr Heparin level in 8 hours Daily heparin level, CBC Monitor s/sx bleeding F/u transition back to warfarin after procedures  Narda Bonds, PharmD, [place] Pharmacist Phone: [phone]

## 2020-08-08 NOTE — H&P (View-Only) (Signed)
Progress Note  Patient Name: James [name] Date of Encounter: 08/08/2020  [facility] Cardiologist: Mertie Moores, MD   Subjective   Seen in HD this morning. Feeling okay. No recurrent chest pain. States he feels his chest pain was "more a breathing problem" on admission. No SOB today.   Inpatient Medications    Scheduled Meds:  sodium chloride   Intravenous Once   sodium chloride   Intravenous Once   amLODipine  10 mg Oral Daily   Chlorhexidine Gluconate Cloth  6 each Topical Q0600   darbepoetin (ARANESP) injection - DIALYSIS  100 mcg Intravenous Q Mon-HD   insulin aspart  0-6 Units Subcutaneous TID WC   lidocaine  1 patch Transdermal Q24H   metoprolol tartrate  50 mg Oral Q6H   mupirocin ointment  1 application Nasal BID   predniSONE  5 mg Oral Q breakfast   tacrolimus  0.5 mg Oral Daily   Continuous Infusions:  cefTRIAXone (ROCEPHIN)  IV 2 g (08/07/20 1805)   heparin 900 Units/hr (08/08/20 0030)   vancomycin     PRN Meds: acetaminophen **OR** acetaminophen, oxyCODONE-acetaminophen, polyethylene glycol   Vital Signs    Vitals:   08/07/20 1708 08/07/20 2056 08/08/20 0545 08/08/20 0720  BP: (!) 149/89 (!) 159/90 (!) 152/84 (!) 171/92  Pulse: 95  94 90  Resp: 16 16 16 18   Temp: 98.3 F (36.8 C) 98.2 F (36.8 C) 98.1 F (36.7 C) 98.1 F (36.7 C)  TempSrc: Oral Oral Oral Oral  SpO2: 97% 98% 99% 97%  Weight:   66.4 kg     Intake/Output Summary (Last 24 hours) at 08/08/2020 0839 Last data filed at 08/08/2020 0700 Gross per 24 hour  Intake 450.5 ml  Output 50 ml  Net 400.5 ml   Last 3 Weights 08/08/2020 08/07/2020 08/06/2020  Weight (lbs) 146 lb 6.2 oz 145 lb 11.6 oz 135 lb 9.3 oz  Weight (kg) 66.4 kg 66.1 kg 61.5 kg      Telemetry    Sinus rhythm - Personally Reviewed  ECG    No new tracings - Personally Reviewed  Physical Exam   GEN: No acute distress.   Neck: No JVD Cardiac: RRR, no murmurs, rubs, or gallops.  Respiratory: Clear to  auscultation bilaterally. 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 Neuro:  Nonfocal  Psych: Normal affect   Labs    High Sensitivity Troponin:   Recent Labs  Lab 08/01/20 1020 08/01/20 1555 08/01/20 1828  TROPONINIHS 1,049* 1,925* 1,964*      Chemistry Recent Labs  Lab 08/02/20 3664 08/03/20 0249 08/03/20 1248 08/04/20 0249 08/05/20 0225 08/06/20 0825 08/07/20 0001 08/08/20 0800  NA 135   < > 136 139   < > 137 138 136  K 5.3*   < > 2.9* 4.2   < > 3.8 3.4* 3.7  CL 95*   < > 95* 97*   < > 98 96* 97*  CO2 20*   < > 27 28   < > 18* 27 23  GLUCOSE 81   < > 87 86   < > 88 131* 86  BUN 82*   < > 10 17   < > 49* 22* 43*  CREATININE 14.21*   < > 2.94* 5.33*   < > 10.63* 5.66* 9.35*  CALCIUM 9.4   < > 9.2 9.6   < > 9.5 9.1 10.0  PROT 6.6  --   --  7.5  --   --   --   --   ALBUMIN 2.4*  2.4*  --  2.5* 2.3*  --   --   --  2.3*  AST 38  --   --  38  --   --   --   --   ALT 35  --   --  36  --   --   --   --   ALKPHOS 209*  --   --  182*  --   --   --   --   BILITOT 0.7  --   --  0.7  --   --   --   --   GFRNONAA 4*   < > 26* 13*   < > 5* 12* 6*  ANIONGAP 20*   < > 14 14   < > 21* 15 16*   < > = values in this interval not displayed.     Hematology Recent Labs  Lab 08/06/20 0307 08/07/20 0001 08/08/20 0800  WBC 7.2 8.4 8.8  RBC 2.04* 2.64* 2.47*  HGB 7.0* 8.7* 8.3*  HCT 22.5* 27.4* 26.0*  MCV 110.3* 103.8* 105.3*  MCH 34.3* 33.0 33.6  MCHC 31.1 31.8 31.9  RDW 15.6* 18.1* 17.0*  PLT 145* 168 142*    BNPNo results for input(s): BNP, PROBNP in the last 168 hours.   DDimer No results for input(s): DDIMER in the last 168 hours.   Radiology    No results found.  Cardiac Studies   Echocardiogram 08/03/20: 1. EF estimate limited as only para sternal images obtained no apical or sub costal images done due to patient pain and lack of cooperation . Left ventricular ejection fraction, by estimation, is 35 to 40%.  The left ventricle has moderately decreased function. The left ventricle demonstrates global hypokinesis. The left ventricular internal cavity size was moderately dilated. There is moderate  left ventricular hypertrophy.   2. Left atrial size was moderately dilated.   3. The mitral valve is abnormal. Mild mitral valve regurgitation.  Moderate mitral annular calcification.   4. Calcified non coronary cusp. The aortic valve was not well visualized. Mild to moderate aortic valve sclerosis/calcification is present, without any evidence of aortic stenosis.   Patient Profile      50 y.o. 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 Complex PMH as outlined in consult note. Recently in the hospital for ischemic foot s/p L TMA. He presented to the hospital 08/01/2020 after having a fall and being found confused by his aide that arrived, also with some recollection of having had chest pain. He had been confirmed compliant with last scheduled HD session on Friday. He was hypertensive and tachycardic (sinus) on arrival. He required BiPAP on arrival and quickly improved with NTG paste and BiPAP. CT angio ruled out dissection. Cardiology asked to see for elevated troponin. Other issues this admission include worsening anemia + FOBT, waxing/waning encephalopathy and fever. 2D Echo demonstrates new LV dysfunction.  Assessment & Plan   1. NSTEMI: patient presented after being found down at home by his HHA. Reportedly had some recent chest pain. He initially required BiPAP on arrival. HsTrop  peaked at 1964. EKG  without ischemic changes. CTA chest negative for aortic dissection. Echo this admission showed EF 35-40% with global hypokinesis, moderate LV dilation, moderate LVH, moderate LAE, and mild MR. LHC delayed in the setting of acute on chronic anemia with +FOBT requiring transfusion of PRBC. EGD/C-Scope without source of bleeding and cleared for anticoagulation from GI. Hgb stable at 8.3 today. Additionally with c/f possible osteomyelitis, though ortho states MRI is c/w post surgical changes. Risk factors for CAD include HTN, DM type 2, and PVD - Will add on FLP and A1C to AM labs for risk stratification  - Continue heparin gtt for now - Will discuss timing of LHC with Dr. Gasper Sells  - Continue metoprolol - will consolidate to metoprolol succinate tomorrow   2. 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 Prior to this, his last echo was in 2017 with EF 55-60%, G1DD, no RWMA. Etiology remains unclear. Possible this is HTN mediated, though cannot exclude ischemia. Thankfully he has room in his blood pressure for management of his CHF, though CKD limits GDMT.  - Will discuss direction of management with Dr. Gasper Sells - anticipate continuation of BBlocker +/- hydral/nitrates with consideration for stopping amlodipine in light of reduced EF  - Continue volume management per Nephrology with HD  3. HTN: Poorly controlled. Patient denies trouble with hypotension at HD.  - Anticipate management in the context of #2  4. Acute on chronic anemia: Hgb dropped to 6.7 this admission. He received 1 uPRBC. FOBT positive this admission. He went for EGD/C-Scope which was without evidence of bleeding though did show some gastritis. Cleared to restart anticoagulation.  - Continue close monitoring of Hgb  5. PVD s/p right BKA and left TMA: more recently underwent TMA 07/20/20. Noted to have increased drainage and c/f infection. Ortho following. MRI results reviewed and felt to be more c/w post-surgical  changes than osteomyelitis. ID following as well. Patient is currently on IV antibiotics  - Continue management per primary team, ortho, and ID  6. Hx of splenic infarct: home coumadin held on admission given c/f GI bleed. Now s/p EGD/C-scope which was negative, cleared to restart anticoagulation per GI. He remains on a heparin gtt until clear no further procedures are necessary - Continue heparin gtt - Anticipate restarting coumadin once procedures completed  7. ESRD on HD: s/p failed kidney transplant. On HD M/W/F - Continue prednisone and tacrolimus  - Continue HD per nephrology       For questions or updates, please contact [place] Please consult [email or web address] for contact info under        Signed, Abigail Butts, PA-C  08/08/2020, 8:39 AM    Personally seen and examined. Agree with APP above with the following comments: Briefly 50 yo M with multiple risk factors for CAD including PAD and ESRD with R forearm fistula who presents  Patient notes bleeding issues have resolved and that he would like to get some answers Exam notable for bilateral distal amputations Labs notable for stable Hgb 8.7 -> 8.3 on heparin Personally reviewed relevant tests; Echo showed decrease in LV function Would recommend LHC.  Tentatively planned 08/09/20 Risks and benefits of cardiac catheterization have been discussed with the patient.  These include bleeding, infection, kidney damage, stroke, heart attack, death.  The patient understands these risks and is willing to proceed.  Patient would like to see if he can improve his heart function and get answers and is amenable to proceeding.  Kendi Defalco A  Gasper Sells, MD

## 2020-08-08 NOTE — Progress Notes (Addendum)
Progress Note  Patient Name: James [name] Date of Encounter: 08/08/2020  [facility] Cardiologist: Mertie Moores, MD   Subjective   Seen in HD this morning. Feeling okay. No recurrent chest pain. States he feels his chest pain was "more a breathing problem" on admission. No SOB today.   Inpatient Medications    Scheduled Meds:  sodium chloride   Intravenous Once   sodium chloride   Intravenous Once   amLODipine  10 mg Oral Daily   Chlorhexidine Gluconate Cloth  6 each Topical Q0600   darbepoetin (ARANESP) injection - DIALYSIS  100 mcg Intravenous Q Mon-HD   insulin aspart  0-6 Units Subcutaneous TID WC   lidocaine  1 patch Transdermal Q24H   metoprolol tartrate  50 mg Oral Q6H   mupirocin ointment  1 application Nasal BID   predniSONE  5 mg Oral Q breakfast   tacrolimus  0.5 mg Oral Daily   Continuous Infusions:  cefTRIAXone (ROCEPHIN)  IV 2 g (08/07/20 1805)   heparin 900 Units/hr (08/08/20 0030)   vancomycin     PRN Meds: acetaminophen **OR** acetaminophen, oxyCODONE-acetaminophen, polyethylene glycol   Vital Signs    Vitals:   08/07/20 1708 08/07/20 2056 08/08/20 0545 08/08/20 0720  BP: (!) 149/89 (!) 159/90 (!) 152/84 (!) 171/92  Pulse: 95  94 90  Resp: 16 16 16 18   Temp: 98.3 F (36.8 C) 98.2 F (36.8 C) 98.1 F (36.7 C) 98.1 F (36.7 C)  TempSrc: Oral Oral Oral Oral  SpO2: 97% 98% 99% 97%  Weight:   66.4 kg     Intake/Output Summary (Last 24 hours) at 08/08/2020 0839 Last data filed at 08/08/2020 0700 Gross per 24 hour  Intake 450.5 ml  Output 50 ml  Net 400.5 ml   Last 3 Weights 08/08/2020 08/07/2020 08/06/2020  Weight (lbs) 146 lb 6.2 oz 145 lb 11.6 oz 135 lb 9.3 oz  Weight (kg) 66.4 kg 66.1 kg 61.5 kg      Telemetry    Sinus rhythm - Personally Reviewed  ECG    No new tracings - Personally Reviewed  Physical Exam   GEN: No acute distress.   Neck: No JVD Cardiac: RRR, no murmurs, rubs, or gallops.  Respiratory: Clear to  auscultation bilaterally. 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 Neuro:  Nonfocal  Psych: Normal affect   Labs    High Sensitivity Troponin:   Recent Labs  Lab 08/01/20 1020 08/01/20 1555 08/01/20 1828  TROPONINIHS 1,049* 1,925* 1,964*      Chemistry Recent Labs  Lab 08/02/20 9147 08/03/20 0249 08/03/20 1248 08/04/20 0249 08/05/20 0225 08/06/20 0825 08/07/20 0001 08/08/20 0800  NA 135   < > 136 139   < > 137 138 136  K 5.3*   < > 2.9* 4.2   < > 3.8 3.4* 3.7  CL 95*   < > 95* 97*   < > 98 96* 97*  CO2 20*   < > 27 28   < > 18* 27 23  GLUCOSE 81   < > 87 86   < > 88 131* 86  BUN 82*   < > 10 17   < > 49* 22* 43*  CREATININE 14.21*   < > 2.94* 5.33*   < > 10.63* 5.66* 9.35*  CALCIUM 9.4   < > 9.2 9.6   < > 9.5 9.1 10.0  PROT 6.6  --   --  7.5  --   --   --   --   ALBUMIN 2.4*  2.4*  --  2.5* 2.3*  --   --   --  2.3*  AST 38  --   --  38  --   --   --   --   ALT 35  --   --  36  --   --   --   --   ALKPHOS 209*  --   --  182*  --   --   --   --   BILITOT 0.7  --   --  0.7  --   --   --   --   GFRNONAA 4*   < > 26* 13*   < > 5* 12* 6*  ANIONGAP 20*   < > 14 14   < > 21* 15 16*   < > = values in this interval not displayed.     Hematology Recent Labs  Lab 08/06/20 0307 08/07/20 0001 08/08/20 0800  WBC 7.2 8.4 8.8  RBC 2.04* 2.64* 2.47*  HGB 7.0* 8.7* 8.3*  HCT 22.5* 27.4* 26.0*  MCV 110.3* 103.8* 105.3*  MCH 34.3* 33.0 33.6  MCHC 31.1 31.8 31.9  RDW 15.6* 18.1* 17.0*  PLT 145* 168 142*    BNPNo results for input(s): BNP, PROBNP in the last 168 hours.   DDimer No results for input(s): DDIMER in the last 168 hours.   Radiology    No results found.  Cardiac Studies   Echocardiogram 08/03/20: 1. EF estimate limited as only para sternal images obtained no apical or sub costal images done due to patient pain and lack of cooperation . Left ventricular ejection fraction, by estimation, is 35 to 40%.  The left ventricle has moderately decreased function. The left ventricle demonstrates global hypokinesis. The left ventricular internal cavity size was moderately dilated. There is moderate  left ventricular hypertrophy.   2. Left atrial size was moderately dilated.   3. The mitral valve is abnormal. Mild mitral valve regurgitation.  Moderate mitral annular calcification.   4. Calcified non coronary cusp. The aortic valve was not well visualized. Mild to moderate aortic valve sclerosis/calcification is present, without any evidence of aortic stenosis.   Patient Profile      50 y.o. 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 Complex PMH as outlined in consult note. Recently in the hospital for ischemic foot s/p L TMA. He presented to the hospital 08/01/2020 after having a fall and being found confused by his aide that arrived, also with some recollection of having had chest pain. He had been confirmed compliant with last scheduled HD session on Friday. He was hypertensive and tachycardic (sinus) on arrival. He required BiPAP on arrival and quickly improved with NTG paste and BiPAP. CT angio ruled out dissection. Cardiology asked to see for elevated troponin. Other issues this admission include worsening anemia + FOBT, waxing/waning encephalopathy and fever. 2D Echo demonstrates new LV dysfunction.  Assessment & Plan   1. NSTEMI: patient presented after being found down at home by his HHA. Reportedly had some recent chest pain. He initially required BiPAP on arrival. HsTrop  peaked at 1964. EKG  without ischemic changes. CTA chest negative for aortic dissection. Echo this admission showed EF 35-40% with global hypokinesis, moderate LV dilation, moderate LVH, moderate LAE, and mild MR. LHC delayed in the setting of acute on chronic anemia with +FOBT requiring transfusion of PRBC. EGD/C-Scope without source of bleeding and cleared for anticoagulation from GI. Hgb stable at 8.3 today. Additionally with c/f possible osteomyelitis, though ortho states MRI is c/w post surgical changes. Risk factors for CAD include HTN, DM type 2, and PVD - Will add on FLP and A1C to AM labs for risk stratification  - Continue heparin gtt for now - Will discuss timing of LHC with Dr. Gasper Sells  - Continue metoprolol - will consolidate to metoprolol succinate tomorrow   2. 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 Prior to this, his last echo was in 2017 with EF 55-60%, G1DD, no RWMA. Etiology remains unclear. Possible this is HTN mediated, though cannot exclude ischemia. Thankfully he has room in his blood pressure for management of his CHF, though CKD limits GDMT.  - Will discuss direction of management with Dr. Gasper Sells - anticipate continuation of BBlocker +/- hydral/nitrates with consideration for stopping amlodipine in light of reduced EF  - Continue volume management per Nephrology with HD  3. HTN: Poorly controlled. Patient denies trouble with hypotension at HD.  - Anticipate management in the context of #2  4. Acute on chronic anemia: Hgb dropped to 6.7 this admission. He received 1 uPRBC. FOBT positive this admission. He went for EGD/C-Scope which was without evidence of bleeding though did show some gastritis. Cleared to restart anticoagulation.  - Continue close monitoring of Hgb  5. PVD s/p right BKA and left TMA: more recently underwent TMA 07/20/20. Noted to have increased drainage and c/f infection. Ortho following. MRI results reviewed and felt to be more c/w post-surgical  changes than osteomyelitis. ID following as well. Patient is currently on IV antibiotics  - Continue management per primary team, ortho, and ID  6. Hx of splenic infarct: home coumadin held on admission given c/f GI bleed. Now s/p EGD/C-scope which was negative, cleared to restart anticoagulation per GI. He remains on a heparin gtt until clear no further procedures are necessary - Continue heparin gtt - Anticipate restarting coumadin once procedures completed  7. ESRD on HD: s/p failed kidney transplant. On HD M/W/F - Continue prednisone and tacrolimus  - Continue HD per nephrology       For questions or updates, please contact [facility] Please consult [email or web address] for contact info under        Signed, Abigail Butts, PA-C  08/08/2020, 8:39 AM    Personally seen and examined. Agree with APP above with the following comments: Briefly 50 yo M with multiple risk factors for CAD including PAD and ESRD with R forearm fistula who presents  Patient notes bleeding issues have resolved and that he would like to get some answers Exam notable for bilateral distal amputations Labs notable for stable Hgb 8.7 -> 8.3 on heparin Personally reviewed relevant tests; Echo showed decrease in LV function Would recommend LHC.  Tentatively planned 08/09/20 Risks and benefits of cardiac catheterization have been discussed with the patient.  These include bleeding, infection, kidney damage, stroke, heart attack, death.  The patient understands these risks and is willing to proceed.  Patient would like to see if he can improve his heart function and get answers and is amenable to proceeding.  Carzell Saldivar A  Gasper Sells, MD

## 2020-08-08 NOTE — Progress Notes (Signed)
PT Cancellation Note  Patient Details Name: James [name] MRN: [id number] DOB: [birth year]   Cancelled Treatment:    Reason Eval/Treat Not Completed: Patient at procedure or test/unavailable. Pt in HD.    Shary Decamp [facility] 08/08/2020, 8:51 AM [place] Pager [phone] Office [phone]

## 2020-08-08 NOTE — Progress Notes (Signed)
[facility] Progress Note     Assessment/ Plan:   ESRD: -h/o failed txp -outpatient orders [facility]: F200, 3.5HRS, 350/700. 2K/2.5Ca. Heparin 1k units bolus -maintain mwf here, back on schedule   Seen on HD 157/93 4K bath 2L net UF tolerating   -no heparin here -continue with home pred and tacrolimus  NSTEMI -appreciate cardiology -ischemic workup on hold given possible infection and blood loss anemia  New CHF -ef now 35-40%, cardiology on board. UF as tolerated  Tachyarrhythmia, improved. Sinus tach -rapid response called in dialysis unit 1/12, held further UF, improved with valsalva. Looked like SVT initially on tele w/ HR up to the 180s, sinus tach on ekg. -needs to get BB prior to HD  Leukocytosis/fevers -avf looks good, likely not a source -questionable OM however per ortho consistent with post op changes -abx per primary service  Volume/ hypertension/pulmonary edema: -edw69kg, UF as tolerated. Resume home anti-htns. SOB resolved  Anemia secondary to Chronic Kidney Disease and blood loss: -aranesp 65mcg every Wed outpatient. Increased aranesp to 111mcg, dose to be given monday -venofer once every 2 weeks. Iron panel ordered, holding on starting iron given infection eval -1u prbc on 1/11 -stool occult positive, GI on board, cscope and egd 1/14 -macrocytic, b12 high, folate ok -transfuse prn for hgb <7  Secondary Hyperparathyroidism/Hyperphosphatemia:resume home binders (on renvela), monitor phos (5.6 on 1/17) and PTH -hectorol 4.56mcg qtreatment  AMS/encephalopathy -persistent despite HD, possibly another etiology rather than uremic encephalopathy. Mgmt per primary service. Sepsis?  Splenic infarct -a/c per primary service, inr at goal  Vascular access:rue avf +b/t  # Additional recommendations: - Dose all meds for creatinine clearance <10 ml/min  - Unless absolutely necessary, no MRIs with gadolinium.  -  Implement save arm precautions. Prefer needle sticks in the dorsum of the hands or wrists. No blood pressure measurements in arm. - If blood transfusion is requested during hemodialysis sessions, please alert [place] prior to the session.  - If a hemodialysis catheter line culture is requested, please alert [place] as only hemodialysis nurses are able to collect those specimens.  Subjective:   C/o pain in the left dorsal aspect of foot but no discharge Denies f/cn/v/dyspnea   Objective:   BP (!) 158/125   Pulse (!) 106   Temp 98 F (36.7 C) (Oral)   Resp 18   Wt 64.1 kg   SpO2 97%   BMI 22.13 kg/m   Intake/Output Summary (Last 24 hours) at 08/08/2020 1008 Last data filed at 08/08/2020 0700 Gross per 24 hour  Intake 450.5 ml  Output 50 ml  Net 400.5 ml   Weight change: 2.5 kg  Physical Exam: Gen:nad, sitting up  in bed CVS:reg rate Resp: unlabored, no iwob WER:XVQM, nt/nd Ext:no edema, rt BKA, lt TMA Neuro: speech clear and coherent, moves all ext spontaneously, fatigued Access: rue avf  Imaging: No results found.  Labs: BMET Recent Labs  Lab 08/02/20 0628 08/03/20 0249 08/03/20 1248 08/04/20 0249 08/05/20 0225 08/06/20 0825 08/07/20 0001 08/08/20 0800  NA 135 135 136 139 138 137 138 136  K 5.3* 4.2 2.9* 4.2 3.9 3.8 3.4* 3.7  CL 95* 92* 95* 97* 96* 98 96* 97*  CO2 20* 25 27 28 23  18* 27 23  GLUCOSE 81 85 87 86 69* 88 131* 86  BUN 82* 41* 10 17 31* 49* 22* 43*  CREATININE 14.21* 8.80* 2.94* 5.33* 7.99* 10.63* 5.66* 9.35*  CALCIUM 9.4 9.5 9.2 9.6 10.0 9.5 9.1 10.0  PHOS 5.7* 4.5 1.9*  --   --   --   --  5.6*   CBC Recent Labs  Lab 08/05/20 0225 08/06/20 0307 08/07/20 0001 08/08/20 0800  WBC 8.8 7.2 8.4 8.8  HGB 7.5* 7.0* 8.7* 8.3*  HCT 24.3* 22.5* 27.4* 26.0*  MCV 111.0* 110.3* 103.8* 105.3*  PLT 169 145* 168 142*    Medications:    . sodium chloride   Intravenous Once  . sodium chloride   Intravenous Once  . amLODipine  10 mg Oral Daily  .  Chlorhexidine Gluconate Cloth  6 each Topical Q0600  . Darbepoetin Alfa      . darbepoetin (ARANESP) injection - DIALYSIS  100 mcg Intravenous Q Mon-HD  . insulin aspart  0-6 Units Subcutaneous TID WC  . lidocaine  1 patch Transdermal Q24H  . metoprolol tartrate  50 mg Oral Q6H  . mupirocin ointment  1 application Nasal BID  . predniSONE  5 mg Oral Q breakfast  . tacrolimus  0.5 mg Oral Daily      Otelia Santee, MD 08/08/2020, 10:08 AM

## 2020-08-08 NOTE — Progress Notes (Signed)
Subjective:   No events overnight  James [name] was seen and evaluated at bedside during dialysis this AM. Patient states his L foot pain is improved, though he notes increased swelling. Notes increased sensitivity on the plantar aspect of his foot. Discussed PT's recommendation for subacute rehab, and patient maintains that he does not want to go to a facility. Expresses hesitancy given the circumstances of his recent injury as well as risk of COVID exposure. Notes his mother may be upset with him if he declines SNF. States he is willing to have a conversation about SNF options with SW.   Objective:  Vital signs in last 24 hours: Vitals:   08/07/20 1708 08/07/20 2056 08/08/20 0545 08/08/20 0720  BP: (!) 149/89 (!) 159/90 (!) 152/84 (!) 171/92  Pulse: 95  94 90  Resp: 16 16 16 18   Temp: 98.3 F (36.8 C) 98.2 F (36.8 C) 98.1 F (36.7 C) 98.1 F (36.7 C)  TempSrc: Oral Oral Oral Oral  SpO2: 97% 98% 99% 97%  Weight:   66.4 kg    Physical Exam Constitutional:      General: He is not in acute distress.    Appearance: He is ill-appearing.  Pulmonary:     Effort: Pulmonary effort is normal.     Breath sounds: Normal breath sounds. No wheezing, rhonchi or rales.  Abdominal:     General: Abdomen is flat. Bowel sounds are normal. There is no distension.     Palpations: Abdomen is soft.     Tenderness: There is no abdominal tenderness.  Musculoskeletal:     Comments: RLE BKA, LLE TMA, no erythema, warmth, or purulence noted on examination. Patient notes tenderness over medial aspect has improved, but continues to have tenderness over lateral aspect.   Skin:    General: Skin is warm and dry.  Neurological:     Mental Status: He is alert and oriented to person, place, and time.  Psychiatric:        Mood and Affect: Mood normal.        Behavior: Behavior normal.     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  #Leukocytosis #Fever  WBC of 8.8>8.4 this am. No fever, chills. No recorded fevers or episodes of altered mental status. ABx were initiated early on in the admission. MRI with inflammatory changes and fluid collection - osteomyelitis vs early abscess formation vs typical post op changes. ID consulted, recommend continue abx course. ID also recommended culture if possible. Will consider this if patient does not improve or has signs/symptoms of infection. Pt notes improvement of left foot pain. Denies drainage.  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 -wound care eval - daily dressing changes, apply xeroform gauze. -follow up with surgeons once discharge for suture removal -PT/OT recommended SNF, patient to consider and will talk with case managment  #Anemia Morning Hgb of 8.3>8.7. He has been given a total of 2 units of PRBC while admitted.  -no acute bleed per colonoscopy/endoscopy -daily cbc -transfuse hgb under 7.0 -hold anticoagulants  #ESRD on Dialysis MWF #Hx  of Failed Kidney Transplant 2018 #Hyperkalemia Dialysis today. Will trend electrolytes -continue prednisone and tacrolimus in context of renal transplant -per nephro  #Troponemia Elevated troponin's upon admission. Denies recent episodes of chest pain/shortness of breath. Continue to suspect secondary to demand ischemia in setting of uncontrolled HTN. Cleared by ortho for cath - Cards recommendations pending  #New Systolic HF  EF 62-70%, LV moderately decreased function. Global  hypokinesis. Appears euvolemic on exam today.  -cards recommendations pending  #Sinus Tachycardia #QT Prolongation Hx of sinus tachycardia, resumed home metoprolol 50 mg q6h. HR has improved since restarting.  -continue to monitor on tele -avoid qtc prolonging medications  #Hx of HTN Home medications of amlodipine, lisinopril.  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 Negative GI workup, no source of bleeding found during colonoscopy/endoscopy.  -continue heparin  #Acute Hypoxic Respiratory Failure - Resolved #Chest Pain Saturating well on room air, currently denies shortness of breath. Denies new episodes of chest pain.  -CTA chest negative for aortic dissection -dialysis per nephro  #Acute Encephalopathy - Resolved #Anion Gap Metabolic Acidosis Resolved Encephalopathy has resolved. AG of 15  -continue to monitor mental status -dialysis per nephro.  -daily bmp  Diet: Renal IVF: None,None VTE: SCDs Code: Full PT/OT: Patient to talk with case management about SNF placement  Dispo: Discharge to home or SNF pending further workup  Sanjuana Letters DO  Internal Medicine Resident PGY-1 [place]  Pager: [phone]   Please contact the on call pager after 5 pm and on weekends at [phone].

## 2020-08-08 NOTE — Progress Notes (Signed)
Pt refused to allow NT to shave his groin and radial for cath prep. Also pt refused to watch educational video for cardiac cath that was offered

## 2020-08-08 NOTE — TOC Initial Note (Addendum)
Transition of Care [facility]) - Initial/Assessment Note    Patient Details  Name: James [name] MRN: [id number] Date of Birth: 12/15/1970  Transition of Care [facility]) CM/SW Contact:    Trula Ore, [place] Phone Number: 08/08/2020, 2:15 PM  Clinical Narrative:                 CSW received consult for possible SNF placement at time of discharge. CSW spoke with patient regarding PT recommendation of SNF placement at time of discharge. Patient comes from home alone. Patient expressed understanding of PT recommendation and declined SNF placement at time of discharge. Patient says he does not have 24/7 supervision at home but will work on finding someone that can help provide the supervision at home. Patient says he has had home health services through [facility].  Patient is interested in [place] services. CSW informed case Freight forwarder.No further questions reported at this time. CSW to continue to follow and assist with discharge planning needs.    Expected Discharge Plan: [place] Barriers to Discharge: Continued Medical Work up   Patient Goals and CMS Choice Patient states their goals for this hospitalization and ongoing recovery are:: to go [facility] with [facility] services CMS Medicare.gov Compare Post Acute Care list provided to:: Patient Choice offered to / list presented to : Patient  Expected Discharge Plan and Services Expected Discharge Plan: [place] In-house Referral: NA Discharge Planning Services: CM Consult Post Acute Care Choice: Home Health,Resumption of Svcs/PTA Provider Living arrangements for the past 2 months: Single Family Home                           HH Arranged: RN,Disease Management,PT,OT HH Agency: [place] Date [facility] Agency Contacted: 08/05/20 Time HH Agency Contacted: [phone] Representative spoke with at [place]: Tommi Rumps  Prior Living Arrangements/Services Living arrangements for the past 2 months: [place] with:: Self Patient language and need for interpreter reviewed:: Yes Do you feel safe going back to the place where you live?: Yes      Need for Family Participation in Patient Care: Yes (Comment) Care giver support system in place?: Yes (comment)   Criminal Activity/Legal Involvement Pertinent to Current Situation/Hospitalization: No - Comment as needed  Activities of Daily Living Home Assistive Devices/Equipment: Wheelchair,Walker (specify type),Bedside commode/3-in-1,Shower chair with back ADL Screening (condition at time of admission) Patient's cognitive ability adequate to safely complete daily activities?: Yes Is the patient deaf or have difficulty hearing?: No Does the patient have difficulty seeing, even when wearing glasses/contacts?: No Does the patient have difficulty concentrating, remembering, or making decisions?: No Patient able to express need for assistance with ADLs?: Yes Does the patient have difficulty dressing or bathing?: Yes Independently performs ADLs?: Yes (appropriate for developmental age) Does the patient have difficulty walking or climbing stairs?: Yes Weakness of Legs: Both Weakness of Arms/Hands: Both  Permission Sought/Granted Permission sought to share information with : Case Manager,Family Chief Financial Officer Permission granted to share information with : Yes, Verbal Permission Granted     Permission granted to share info w AGENCY: HH        Emotional Assessment Appearance:: Appears stated age Attitude/Demeanor/Rapport: Gracious Affect (typically observed): Calm Orientation: : Oriented to Self,Oriented to Place,Oriented to  Time,Oriented to Situation Alcohol / Substance Use: Not Applicable Psych Involvement: No (comment)  Admission diagnosis:  Respiratory failure ([place]) [J96.90] Hyperkalemia [E87.5] Acute pulmonary edema ([place]) [J81.0]  Elevated troponin [R77.8] Hypertensive urgency [I16.0] Patient Active  Problem List   Diagnosis Date Noted  . Respiratory failure ([place]) 08/01/2020  . Prolonged QT interval   . End stage renal disease ([place]) 02/18/2020  . Spontaneous bacterial peritonitis ([place]) 10/28/2019  . 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 Hypotension 03/27/2019  . Anemia   . Anemia in chronic renal disease 04/17/2016  . Renal transplant disorder   . Diarrhea 08/13/2015  . Tachycardia 04/09/2014  . ESRD on peritoneal dialysis ([place])   . Diabetes mellitus without complication ([place])   . Hypertension    PCP:  [name], MD Pharmacy:   [facility] [place], [address] AT [address] [place] [address] Phone: [phone] Fax: [phone]     Social Determinants of Health (SDOH) Interventions    Readmission Risk Interventions No flowsheet data found.

## 2020-08-08 NOTE — Progress Notes (Addendum)
[place] for IV heparin Indication: history of splenic infarct  Allergies  Allergen Reactions  . Tramadol Hcl Shortness Of Breath and Other (See Comments)    Tramadol HCl = Shortness of breath and Tramadol = Rash  . Nsaids Other (See Comments)    Was told by MD to not take these  . Darvon [Propoxyphene] Rash and Other (See Comments)    Broke out on eyelid     Patient Measurements: Weight: 62 kg (136 lb 11 oz) Heparin Dosing Weight: 65.5 kg  Vital Signs: Temp: 98.5 F (36.9 C) (01/17 1210) Temp Source: Oral (01/17 1210) BP: 134/78 (01/17 1210) Pulse Rate: 106 (01/17 1210)  Labs: Recent Labs    08/06/20 0307 08/06/20 0307 08/06/20 0825 08/07/20 0001 08/07/20 1002 08/07/20 2243 08/08/20 0800  HGB 7.0*  --   --  8.7*  --   --  8.3*  HCT 22.5*  --   --  27.4*  --   --  26.0*  PLT 145*  --   --  168  --   --  142*  LABPROT 14.6  --   --   --   --   --   --   INR 1.2  --   --   --   --   --   --   HEPARINUNFRC  --    < >  --  1.00* <0.10* <0.10* <0.10*  CREATININE  --   --  10.63* 5.66*  --   --  9.35*   < > = values in this interval not displayed.    Estimated Creatinine Clearance: 8.6 mL/min (A) (by C-G formula based on SCr of 9.35 mg/dL (H)).   Medical History: Past Medical History:  Diagnosis Date  . Anemia   . Avascular necrosis ([place])   . BPH (benign prostatic hyperplasia)   . Complication of anesthesia    slow to wake up  . ESRD (end stage renal disease) ([place])    failed kidney transplant  . GERD (gastroesophageal reflux disease)   . Glaucoma   . History of blood transfusion   . Hypertension   . Hypertriglyceridemia   . Influenza A   . 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 Peripheral vascular disease ([place])     s/p prior R BKA 07/2019 and recent L TMA 06/2020  . Pneumonia 04/21/2017   several times per pt on 04/22/20  . Prolonged QT interval    . Prostate cancer ([place])    prostate cancer, s/p prostatectomy 04/24/19  . Renal disorder    dialysis m w f  . 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 He then got HD in [facility] until getting a renal transplant in 2009 at [facility] [place].  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    . Smoker    tx wellbutriin  . 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 Tachycardia   . Thrombocytopenia ([place])   . Transplant recipient   . Type 2 diabetes mellitus ([place])    type 2 - pt states no meds, diet controlled as of 04/22/20  Medications:  Medications Prior to Admission  Medication Sig Dispense Refill Last Dose  . acetaminophen (TYLENOL) 500 MG tablet Take 500-1,000 mg by mouth every 6 (six) hours as needed (pain or headaches).    Past Week at Unknown time  . amLODipine (NORVASC) 10 MG tablet Take 1 tablet (10 mg total) by mouth daily. 30 tablet 0 Past Month at Unknown time  . aspirin EC 81 MG tablet Take 81 mg by mouth daily. Swallow whole.   Past Month at Unknown time  . diphenhydrAMINE (BENADRYL) 25 mg capsule Take 25 mg by mouth every 6 (six) hours as needed for itching.    unk  . warfarin (COUMADIN) 5 MG tablet Take 5-7.5 mg by mouth See admin instructions. Take 1.5 tablets (7.5 mg) by mouth in the evenings on Mondays, Wednesdays, & Fridays. Take 1 tablet (5 mg) by mouth in the evening on Sundays, Tuesdays, Thursdays, & Saturdays.   07/19/2020  . buPROPion (WELLBUTRIN XL) 150 MG 24 hr tablet Take 150 mg by mouth daily.     . Cyanocobalamin (B-12) 500 MCG TABS Take 500 mcg by mouth daily.      Marland Kitchen doxazosin (CARDURA) 2 MG tablet Take 2 mg by mouth at bedtime.     . ferric citrate (AURYXIA) 1 GM 210 MG(Fe) tablet Take 210-420 mg by mouth See admin instructions.  Take 2 tablets (420 mg) by mouth up to three times daily with meals and 1 tablet (210 mg) with snacks     . gabapentin (NEURONTIN) 100 MG capsule Take 100 mg by mouth at bedtime.     . insulin aspart (NOVOLOG FLEXPEN) 100 UNIT/ML FlexPen Inject 10 Units into the skin 3 (three) times daily with meals. (Patient taking differently: Inject 2 Units into the skin 3 (three) times daily as needed for high blood sugar (CBG >180).) 15 mL 11   . Insulin Detemir (LEVEMIR FLEXTOUCH) 100 UNIT/ML Pen Inject 15 Units into the skin at bedtime. (Patient not taking: Reported on 08/01/2020) 15 mL 11 Not Taking at Unknown time  . latanoprost (XALATAN) 0.005 % ophthalmic solution Place 1 drop into both eyes at bedtime.      Marland Kitchen lisinopril (ZESTRIL) 10 MG tablet Take 10 mg by mouth at bedtime.     Marland Kitchen loperamide (IMODIUM) 2 MG capsule Take 2 mg by mouth as needed for diarrhea or loose stools.      . Melatonin 5 MG CAPS Take 5 mg by mouth at bedtime as needed (sleep).     . metoprolol tartrate (LOPRESSOR) 100 MG tablet Take 100 mg by mouth 2 (two) times daily.      Marland Kitchen omeprazole (PRILOSEC) 20 MG capsule Take 20 mg by mouth daily.      Marland Kitchen oxyCODONE (OXY IR/ROXICODONE) 5 MG immediate release tablet Take 5 mg by mouth every 6 (six) hours as needed (pain).     Marland Kitchen oxyCODONE-acetaminophen (PERCOCET) 5-325 MG tablet Take 1 tablet by mouth every 6 (six) hours as needed. 20 tablet 0   . predniSONE (DELTASONE) 5 MG tablet Take 5 mg by mouth daily with breakfast.      . tacrolimus (PROGRAF) 0.5 MG capsule Take 0.5 mg by mouth daily.  (Patient not taking: Reported on 08/02/2020)   Not Taking at Unknown time  . triamcinolone cream (KENALOG) 0.1 % Apply 1 application topically daily. (dermatitis) After bathing     . valGANciclovir (VALCYTE) 450 MG tablet Take 450 mg by mouth every Monday, Wednesday, and Friday with hemodialysis.  5   . zolpidem (AMBIEN) 10 MG tablet Take 10 mg by mouth at bedtime as needed for sleep.        Assessment: 50  year old male admitted for acute hypoxic respiratory failure with PMH of ESRD on hemodialysis, history of failed transplant, history of lymphoma, hypertension, BPH, prostate cancer, type 2 diabetes, peripheral vascular disease, right BKA and recent left TMA. Pharmacy consulted to start IV heparin for history of splenic infarct.  Prior to admission warfarin for history of splenic infarct. Home regimen 7.5 mg on MWF and 5 mg rest of week, last dose 07/19/20.  Warfarin held since admission due to FOBT positive and suspected bleed. Workup with colonoscopy and EGD, cleared to resume anticoagulation per GI.   Heparin level is still undetectable <0.1 after rate increased to 900 units/hr. No bleeding or infusion issues noted.   Hgb 8.3 today, platelets wnl.  Goal of Therapy:  Heparin level 0.3-0.7 units/ml Monitor platelets by anticoagulation protocol: Yes   Plan:  Increase IV heparin to 1100 units/hr Heparin level in 8 hours Daily heparin level, CBC Monitor s/sx bleeding F/u transition back to warfarin after procedures  Erin Hearing PharmD., BCPS Clinical Pharmacist 08/08/2020 1:14 PM

## 2020-08-08 NOTE — Progress Notes (Signed)
[place] for heparin Indication: history of splenic infarct  Allergies  Allergen Reactions  . Tramadol Hcl Shortness Of Breath and Other (See Comments)    Tramadol HCl = Shortness of breath and Tramadol = Rash  . Nsaids Other (See Comments)    Was told by MD to not take these  . Darvon [Propoxyphene] Rash and Other (See Comments)    Broke out on eyelid     Patient Measurements: Weight: 66.1 kg (145 lb 11.6 oz) Heparin Dosing Weight: 65.5 kg  Vital Signs: Temp: 98.3 F (36.8 C) (01/16 1708) Temp Source: Oral (01/16 1708) BP: 149/89 (01/16 1708) Pulse Rate: 95 (01/16 1708)  Labs: Recent Labs    08/05/20 0225 08/06/20 0307 08/06/20 0825 08/07/20 0001 08/07/20 1002 08/07/20 2243  HGB 7.5* 7.0*  --  8.7*  --   --   HCT 24.3* 22.5*  --  27.4*  --   --   PLT 169 145*  --  168  --   --   LABPROT  --  14.6  --   --   --   --   INR  --  1.2  --   --   --   --   HEPARINUNFRC  --   --   --  1.00* <0.10* <0.10*  CREATININE 7.99*  --  10.63* 5.66*  --   --     Estimated Creatinine Clearance: 15.1 mL/min (A) (by C-G formula based on SCr of 5.66 mg/dL (H)).   Assessment: 50 y.o. male with h/o splenic infarction, Coumadin on hold, for heparin  Goal of Therapy:  Heparin level 0.3-0.7 units/ml Monitor platelets by anticoagulation protocol: Yes   Plan:  Increase Heparin 900 units/hr Check heparin level in 8 hours.   Phillis Knack, PharmD, BCPS

## 2020-08-08 NOTE — Progress Notes (Signed)
Cc: Left foot hurts   Antibiotics:  Anti-infectives (From admission, onward)   Start     Dose/Rate Route Frequency Ordered Stop   08/08/20 1200  vancomycin (VANCOREADY) IVPB 750 mg/150 mL        750 mg 150 mL/hr over 60 Minutes Intravenous Every M-W-F (Hemodialysis) 08/05/20 1300     08/08/20 0953  Vancomycin (VANCOCIN) 750-5 MG/150ML-% IVPB       Note to Pharmacy: Ashley Akin   : cabinet override      08/08/20 0953 08/08/20 2159   08/06/20 1200  vancomycin (VANCOCIN) IVPB 750 mg/150 ml premix        750 mg 150 mL/hr over 60 Minutes Intravenous Every T-Th-Sa (Hemodialysis) 08/05/20 1254 08/06/20 1628   08/05/20 1200  vancomycin (VANCOREADY) IVPB 750 mg/150 mL  Status:  Discontinued        750 mg 150 mL/hr over 60 Minutes Intravenous Every M-W-F (Hemodialysis) 08/03/20 1630 08/05/20 1259   08/03/20 1700  cefTRIAXone (ROCEPHIN) 2 g in sodium chloride 0.9 % 100 mL IVPB        2 g 200 mL/hr over 30 Minutes Intravenous Every 24 hours 08/03/20 1603     08/03/20 1700  vancomycin (VANCOREADY) IVPB 1250 mg/250 mL        1,250 mg 166.7 mL/hr over 90 Minutes Intravenous  Once 08/03/20 1630 08/03/20 2033   08/01/20 1330  cefTRIAXone (ROCEPHIN) 1 g in sodium chloride 0.9 % 100 mL IVPB        1 g 200 mL/hr over 30 Minutes Intravenous  Once 08/01/20 1316 08/01/20 1630   08/01/20 1330  azithromycin (ZITHROMAX) 500 mg in sodium chloride 0.9 % 250 mL IVPB  Status:  Discontinued        500 mg 250 mL/hr over 60 Minutes Intravenous  Once 08/01/20 1316 08/01/20 1521      Medications: Scheduled Meds: . sodium chloride   Intravenous Once  . sodium chloride   Intravenous Once  . amLODipine  10 mg Oral Daily  . Chlorhexidine Gluconate Cloth  6 each Topical Q0600  . Darbepoetin Alfa      . darbepoetin (ARANESP) injection - DIALYSIS  100 mcg Intravenous Q Mon-HD  . insulin aspart  0-6 Units Subcutaneous TID WC  . lidocaine  1 patch Transdermal Q24H  . metoprolol tartrate  50 mg Oral  Q6H  . mupirocin ointment  1 application Nasal BID  . predniSONE  5 mg Oral Q breakfast  . tacrolimus  0.5 mg Oral Daily   Continuous Infusions: . cefTRIAXone (ROCEPHIN)  IV 2 g (08/07/20 1805)  . heparin 900 Units/hr (08/08/20 0030)  . Vancomycin    . vancomycin     PRN Meds:.acetaminophen **OR** acetaminophen, oxyCODONE-acetaminophen, polyethylene glycol    Objective: Weight change: 2.5 kg  Intake/Output Summary (Last 24 hours) at 08/08/2020 0957 Last data filed at 08/08/2020 0700 Gross per 24 hour  Intake 450.5 ml  Output 50 ml  Net 400.5 ml   Blood pressure (!) 179/94, pulse 94, temperature 98 F (36.7 C), temperature source Oral, resp. rate 18, weight 64.1 kg, SpO2 97 %. Temp:  [98 F (36.7 C)-98.5 F (36.9 C)] 98 F (36.7 C) (01/17 0825) Pulse Rate:  [85-97] 94 (01/17 0930) Resp:  [16-21] 18 (01/17 0900) BP: (149-179)/(80-94) 179/94 (01/17 0930) SpO2:  [95 %-99 %] 97 % (01/17 0833) Weight:  [64.1 kg-66.4 kg] 64.1 kg (01/17 0825)  Physical Exam: Physical Exam Constitutional:  Appearance: He is well-developed and well-nourished.  HENT:     Head: Normocephalic and atraumatic.  Eyes:     Extraocular Movements: EOM normal.     Conjunctiva/sclera: Conjunctivae normal.  Cardiovascular:     Rate and Rhythm: Normal rate and regular rhythm.  Pulmonary:     Effort: Pulmonary effort is normal. No respiratory distress.     Breath sounds: No stridor. No wheezing or rhonchi.  Abdominal:     General: There is no distension.     Palpations: Abdomen is soft.  Musculoskeletal:        General: No edema.     Cervical back: Normal range of motion and neck supple.  Skin:    General: Skin is warm and dry.     Findings: No erythema or rash.  Neurological:     General: No focal deficit present.     Mental Status: He is alert and oriented to person, place, and time.  Psychiatric:        Mood and Affect: Mood and affect normal.     Left foot  08/08/2020:     CBC:    BMET Recent Labs    08/07/20 0001 08/08/20 0800  NA 138 136  K 3.4* 3.7  CL 96* 97*  CO2 27 23  GLUCOSE 131* 86  BUN 22* 43*  CREATININE 5.66* 9.35*  CALCIUM 9.1 10.0     Liver Panel  Recent Labs    08/08/20 0800  ALBUMIN 2.3*       Sedimentation Rate Recent Labs    08/08/20 0800  ESRSEDRATE >140*   C-Reactive Protein No results for input(s): CRP in the last 72 hours.  Micro Results: Recent Results (from the past 720 hour(s))  Resp Panel by RT-PCR (Flu A&B, Covid) Nasopharyngeal Swab     Status: None   Collection Time: 08/01/20 12:29 PM   Specimen: Nasopharyngeal Swab; Nasopharyngeal(NP) swabs in vial transport medium  Result Value Ref Range Status   SARS Coronavirus 2 by RT PCR NEGATIVE NEGATIVE Final    Comment: (NOTE) SARS-CoV-2 target nucleic acids are NOT DETECTED.  The SARS-CoV-2 RNA is generally detectable in upper respiratory specimens during the acute phase of infection. The lowest concentration of SARS-CoV-2 viral copies this assay can detect is 138 copies/mL. A negative result does not preclude SARS-Cov-2 infection and should not be used as the sole basis for treatment or other patient management decisions. A negative result may occur with  improper specimen collection/handling, submission of specimen other than nasopharyngeal swab, presence of viral mutation(s) within the areas targeted by this assay, and inadequate number of viral copies(<138 copies/mL). A negative result must be combined with clinical observations, patient history, and epidemiological information. The expected result is Negative.  Fact Sheet for Patients:  EntrepreneurPulse.com.au  Fact Sheet for Healthcare Providers:  IncredibleEmployment.be  This test is no t yet approved or cleared by the [place] FDA and  has been authorized for detection and/or diagnosis of SARS-CoV-2 by FDA under an Emergency  Use Authorization (EUA). This EUA will remain  in effect (meaning this test can be used) for the duration of the COVID-19 declaration under Section 564(b)(1) of the Act, 21 U.S.C.section 360bbb-3(b)(1), unless the authorization is terminated  or revoked sooner.       Influenza A by PCR NEGATIVE NEGATIVE Final   Influenza B by PCR NEGATIVE NEGATIVE Final    Comment: (NOTE) The Xpert Xpress SARS-CoV-2/FLU/RSV plus assay is intended as an aid in the diagnosis of influenza  from Nasopharyngeal swab specimens and should not be used as a sole basis for treatment. Nasal washings and aspirates are unacceptable for Xpert Xpress SARS-CoV-2/FLU/RSV testing.  Fact Sheet for Patients: EntrepreneurPulse.com.au  Fact Sheet for Healthcare Providers: IncredibleEmployment.be  This test is not yet approved or cleared by the [place] FDA and has been authorized for detection and/or diagnosis of SARS-CoV-2 by FDA under an Emergency Use Authorization (EUA). This EUA will remain in effect (meaning this test can be used) for the duration of the COVID-19 declaration under Section 564(b)(1) of the Act, 21 U.S.C. section 360bbb-3(b)(1), unless the authorization is terminated or revoked.  Performed at [facility], [place] [address] [place], [place] [address]   Culture, blood (routine x 2)     Status: None (Preliminary result)   Collection Time: 08/03/20  4:14 PM   Specimen: BLOOD  Result Value Ref Range Status   Specimen Description BLOOD LEFT ANTECUBITAL  Final   Special Requests   Final    BOTTLES DRAWN AEROBIC AND ANAEROBIC Blood Culture adequate volume   Culture   Final    NO GROWTH 4 DAYS Performed at [facility], [place] [address], [place], [place] [address]    Report Status PENDING  Incomplete  Culture, blood (routine x 2)     Status: None (Preliminary result)   Collection Time: 08/03/20  4:14 PM   Specimen: BLOOD LEFT HAND  Result Value Ref  Range Status   Specimen Description BLOOD LEFT HAND  Final   Special Requests   Final    BOTTLES DRAWN AEROBIC AND ANAEROBIC Blood Culture adequate volume   Culture   Final    NO GROWTH 4 DAYS Performed at [facility], [place] [address], [place], [place] [address]    Report Status PENDING  Incomplete  MRSA PCR Screening     Status: Abnormal   Collection Time: 08/05/20  6:09 AM   Specimen: Nasopharyngeal  Result Value Ref Range Status   MRSA by PCR POSITIVE (A) NEGATIVE Final    Comment:        The GeneXpert MRSA Assay (FDA approved for NASAL specimens only), is one component of a comprehensive MRSA colonization surveillance program. It is not intended to diagnose MRSA infection nor to guide or monitor treatment for MRSA infections. RESULT CALLED TO, READ BACK BY AND VERIFIED WITH: Aggie Cosier RN 10:00 08/05/20 ([email or web address]) Performed at [facility], [place] [address] [place], [place] [address]     Studies/Results: No results found.    Assessment/Plan:  INTERVAL HISTORY: pt to be seen by Dr. Sharol Given today   Active Problems:   Tachycardia   End stage renal disease ([place])   Anemia   Renal transplant recipient   Respiratory failure [facility])    James [name] is a 50 y.o. 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  Pathology at [facility] showed necrotic bone in the areas that were amputated.  Not clear to me if the margins were clean or not.  He was admitted with cephalopathy and concern for infection at the operative site.  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  --would favor surgical intervention to  help guide  cultures and help with source control --in interim will keep him on broad coverage but-ceftriaxone to cefepime for ease of dosing with dialysis   LOS: 7 days   Alcide Evener 08/08/2020, 9:57 AM

## 2020-08-09 ENCOUNTER — Inpatient Hospital Stay (HOSPITAL_COMMUNITY)
Admission: EM | Disposition: A | Discharge status: Home Health | Payer: Self-pay | Source: Home / Self Care | Attending: Internal Medicine

## 2020-08-09 ENCOUNTER — Encounter (HOSPITAL_COMMUNITY): Payer: Self-pay | Admitting: Cardiology

## 2020-08-09 DIAGNOSIS — R000 Tachycardia, unspecified: Secondary | ICD-10-CM | POA: Diagnosis not present

## 2020-08-09 DIAGNOSIS — I739 Peripheral vascular disease, unspecified: Secondary | ICD-10-CM

## 2020-08-09 DIAGNOSIS — D509 Iron deficiency anemia, unspecified: Secondary | ICD-10-CM | POA: Diagnosis not present

## 2020-08-09 DIAGNOSIS — D649 Anemia, unspecified: Secondary | ICD-10-CM

## 2020-08-09 DIAGNOSIS — J810 Acute pulmonary edema: Secondary | ICD-10-CM | POA: Diagnosis not present

## 2020-08-09 DIAGNOSIS — I160 Hypertensive urgency: Secondary | ICD-10-CM

## 2020-08-09 DIAGNOSIS — G9340 Encephalopathy, unspecified: Secondary | ICD-10-CM | POA: Diagnosis not present

## 2020-08-09 DIAGNOSIS — I10 Essential (primary) hypertension: Secondary | ICD-10-CM

## 2020-08-09 DIAGNOSIS — I2510 Atherosclerotic heart disease of native coronary artery without angina pectoris: Secondary | ICD-10-CM

## 2020-08-09 HISTORY — PX: LEFT HEART CATH AND CORONARY ANGIOGRAPHY: CATH118249

## 2020-08-09 LAB — CBC
HCT: 27.9 % — ABNORMAL LOW (ref 39.0–52.0)
Hemoglobin: 8.9 g/dL — ABNORMAL LOW (ref 13.0–17.0)
MCH: 33.8 pg (ref 26.0–34.0)
MCHC: 31.9 g/dL (ref 30.0–36.0)
MCV: 106.1 fL — ABNORMAL HIGH (ref 80.0–100.0)
Platelets: 153 10*3/uL (ref 150–400)
RBC: 2.63 MIL/uL — ABNORMAL LOW (ref 4.22–5.81)
RDW: 16.9 % — ABNORMAL HIGH (ref 11.5–15.5)
WBC: 9.4 10*3/uL (ref 4.0–10.5)
nRBC: 0 % (ref 0.0–0.2)

## 2020-08-09 LAB — BASIC METABOLIC PANEL
Anion gap: 15 (ref 5–15)
BUN: 32 mg/dL — ABNORMAL HIGH (ref 6–20)
CO2: 23 mmol/L (ref 22–32)
Calcium: 10.2 mg/dL (ref 8.9–10.3)
Chloride: 95 mmol/L — ABNORMAL LOW (ref 98–111)
Creatinine, Ser: 6.91 mg/dL — ABNORMAL HIGH (ref 0.61–1.24)
GFR, Estimated: 9 mL/min — ABNORMAL LOW (ref >60)
Glucose, Bld: 95 mg/dL (ref 70–99)
Potassium: 4 mmol/L (ref 3.5–5.1)
Sodium: 133 mmol/L — ABNORMAL LOW (ref 135–145)

## 2020-08-09 LAB — GLUCOSE, CAPILLARY
Glucose-Capillary: 84 mg/dL (ref 70–99)
Glucose-Capillary: 83 mg/dL (ref 70–99)
Glucose-Capillary: 168 mg/dL — ABNORMAL HIGH (ref 70–99)
Glucose-Capillary: 96 mg/dL (ref 70–99)
Glucose-Capillary: 99 mg/dL (ref 70–99)

## 2020-08-09 LAB — HEPARIN LEVEL (UNFRACTIONATED): Heparin Unfractionated: <0.1 IU/mL — ABNORMAL LOW (ref 0.30–0.70)

## 2020-08-09 LAB — SURGICAL PATHOLOGY

## 2020-08-09 LAB — POCT ACTIVATED CLOTTING TIME: Activated Clotting Time: 166 seconds

## 2020-08-09 SURGERY — LEFT HEART CATH AND CORONARY ANGIOGRAPHY
Anesthesia: LOCAL

## 2020-08-09 MED ORDER — VERAPAMIL HCL 2.5 MG/ML IV SOLN
INTRAVENOUS | Status: AC
  Filled 2020-08-09: qty 2

## 2020-08-09 MED ORDER — LIDOCAINE HCL (PF) 1 % IJ SOLN
INTRAMUSCULAR | Status: AC
  Filled 2020-08-09: qty 30

## 2020-08-09 MED ORDER — SODIUM CHLORIDE 0.9% FLUSH: 3.0000 mL | INTRAVENOUS | Status: DC | PRN

## 2020-08-09 MED ORDER — LIDOCAINE HCL (PF) 1 % IJ SOLN
INTRAMUSCULAR | Status: DC | PRN
  Administered 2020-08-09: 30 mL via INTRADERMAL

## 2020-08-09 MED ORDER — SODIUM CHLORIDE 0.9 % IV SOLN: 250.0000 mL | INTRAVENOUS | Status: DC | PRN

## 2020-08-09 MED ORDER — HYDRALAZINE HCL 20 MG/ML IJ SOLN: 10.0000 mg | INTRAMUSCULAR | Status: AC | PRN

## 2020-08-09 MED ORDER — FENTANYL CITRATE (PF) 100 MCG/2ML IJ SOLN
INTRAMUSCULAR | Status: AC
  Filled 2020-08-09: qty 2

## 2020-08-09 MED ORDER — SODIUM CHLORIDE 0.9% FLUSH
3.0000 mL | Freq: Two times a day (BID) | INTRAVENOUS | Status: DC
  Administered 2020-08-09 – 2020-08-10 (×3): 3 mL via INTRAVENOUS

## 2020-08-09 MED ORDER — HEPARIN (PORCINE) 25000 UT/250ML-% IV SOLN
1400.0000 [IU]/h | INTRAVENOUS | Status: DC
  Administered 2020-08-09: 1300 [IU]/h via INTRAVENOUS
  Administered 2020-08-10: 1400 [IU]/h via INTRAVENOUS
  Filled 2020-08-09: qty 250

## 2020-08-09 MED ORDER — MIDAZOLAM HCL 2 MG/2ML IJ SOLN
INTRAMUSCULAR | Status: AC
  Filled 2020-08-09: qty 2

## 2020-08-09 MED ORDER — ONDANSETRON HCL 4 MG/2ML IJ SOLN: 4.0000 mg | Freq: Four times a day (QID) | INTRAMUSCULAR | Status: DC | PRN

## 2020-08-09 MED ORDER — HEPARIN (PORCINE) IN NACL 1000-0.9 UT/500ML-% IV SOLN
INTRAVENOUS | Status: AC
  Filled 2020-08-09: qty 500

## 2020-08-09 MED ORDER — HEPARIN (PORCINE) IN NACL 1000-0.9 UT/500ML-% IV SOLN
INTRAVENOUS | Status: DC | PRN
  Administered 2020-08-09 (×2): 500 mL

## 2020-08-09 MED ORDER — HYDRALAZINE HCL 25 MG PO TABS
25.0000 mg | ORAL_TABLET | Freq: Three times a day (TID) | ORAL | Status: DC
  Administered 2020-08-09 – 2020-08-10 (×3): 25 mg via ORAL
  Filled 2020-08-09 (×3): qty 1

## 2020-08-09 MED ORDER — CARVEDILOL 25 MG PO TABS: 25.0000 mg | ORAL_TABLET | Freq: Two times a day (BID) | ORAL | Status: DC

## 2020-08-09 MED ORDER — LABETALOL HCL 5 MG/ML IV SOLN
10.0000 mg | INTRAVENOUS | Status: AC | PRN
  Administered 2020-08-09: 10 mg via INTRAVENOUS
  Filled 2020-08-09: qty 4

## 2020-08-09 MED ORDER — CARVEDILOL 25 MG PO TABS
25.0000 mg | ORAL_TABLET | Freq: Two times a day (BID) | ORAL | Status: DC
  Administered 2020-08-09 – 2020-08-10 (×3): 25 mg via ORAL
  Filled 2020-08-09 (×3): qty 1

## 2020-08-09 MED ORDER — MIDAZOLAM HCL 2 MG/2ML IJ SOLN
INTRAMUSCULAR | Status: DC | PRN
  Administered 2020-08-09: 1 mg via INTRAVENOUS

## 2020-08-09 MED ORDER — ATORVASTATIN CALCIUM 40 MG PO TABS
40.0000 mg | ORAL_TABLET | Freq: Every day | ORAL | Status: DC
  Administered 2020-08-09: 40 mg via ORAL
  Filled 2020-08-09: qty 1

## 2020-08-09 MED ORDER — IOHEXOL 350 MG/ML SOLN
INTRAVENOUS | Status: DC | PRN
  Administered 2020-08-09: 70 mL

## 2020-08-09 MED ORDER — FENTANYL CITRATE (PF) 100 MCG/2ML IJ SOLN
INTRAMUSCULAR | Status: DC | PRN
  Administered 2020-08-09: 25 ug via INTRAVENOUS

## 2020-08-09 MED ORDER — HEPARIN SODIUM (PORCINE) 1000 UNIT/ML IJ SOLN
INTRAMUSCULAR | Status: AC
  Filled 2020-08-09: qty 1

## 2020-08-09 MED ORDER — ACETAMINOPHEN 325 MG PO TABS: 650.0000 mg | ORAL_TABLET | ORAL | Status: DC | PRN

## 2020-08-09 SURGICAL SUPPLY — 9 items
CATH INFINITI 5FR AL1 (CATHETERS) ×2 IMPLANT
CATH INFINITI 5FR MULTPACK ANG (CATHETERS) ×2 IMPLANT
KIT HEART LEFT (KITS) ×2 IMPLANT
PACK CARDIAC CATHETERIZATION (CUSTOM PROCEDURE TRAY) ×2 IMPLANT
SHEATH PINNACLE 5F 10CM (SHEATH) ×2 IMPLANT
SHEATH PROBE COVER 6X72 (BAG) ×2 IMPLANT
TRANSDUCER W/STOPCOCK (MISCELLANEOUS) ×2 IMPLANT
TUBING CIL FLEX 10 FLL-RA (TUBING) ×2 IMPLANT
WIRE EMERALD 3MM-J .035X150CM (WIRE) ×2 IMPLANT

## 2020-08-09 NOTE — Progress Notes (Signed)
Pharmacy Antibiotic Note  James [name] is a 50 y.o. male with PMD of ESRD on HD, hx of failed kidney transplant, PVD, R BKA and recent left TMA who was admitted on 08/01/2020 with acute encephalopathy and respiratory failure. Pt was febrile and pharmacy was been consulted for vancomycin dosing for empiric therapy.  MRI on 1/14 of left foot compatible with acute osteomyelitis, ortho has been consulted.  Tmax 99, wbc 9.4.   Plan: Vancomycin vancomycin 750 mg IV with each HD (MWF) Monitor WBC, temp, clinical improvement, vancomycin levels  Height: 5\' 9"  (175.3 cm) Weight: 62 kg (136 lb 11 oz) IBW/kg (Calculated) : 70.7  Temp (24hrs), Avg:98.5 F (36.9 C), Min:98 F (36.7 C), Max:99.1 F (37.3 C)  Recent Labs  Lab 08/04/20 0249 08/05/20 0225 08/06/20 0307 08/06/20 0825 08/07/20 0001 08/08/20 0800  WBC 8.2 8.8 7.2  --  8.4 8.8  CREATININE 5.33* 7.99*  --  10.63* 5.66* 9.35*    Estimated Creatinine Clearance: 8.6 mL/min (A) (by C-G formula based on SCr of 9.35 mg/dL (H)).    Allergies  Allergen Reactions  . Tramadol Hcl Shortness Of Breath and Other (See Comments)    Tramadol HCl = Shortness of breath and Tramadol = Rash  . Nsaids Other (See Comments)    Was told by MD to not take these  . Darvon [Propoxyphene] Rash and Other (See Comments)    Broke out on eyelid     Antimicrobials this admission: Ceftriaxone 1/10, 1/12 >>1/17 Cefepime 1/17>> Vancomycin 1/12 >>  Microbiology results: 1/10 COVID, flu A, flu B, HIV screen: negative 1/11 HBsAg: negative 1/12 bld: ngtd 1/14 MRSA PCR: positive  Thank you for allowing pharmacy to be a part of this patient's care.  Erin Hearing PharmD., BCPS Clinical Pharmacist 08/09/2020 3:40 PM

## 2020-08-09 NOTE — Progress Notes (Signed)
52fr sheath aspirated and removed from right femoral artery. Manual pressure applied for 20 minutes. Site level 0 , no s+s of hematoma. Tegaderm dressing applied , bedrest instructions given.   Right popliteal pulse palpable.    Bedrest begins at 11:10:00

## 2020-08-09 NOTE — Progress Notes (Signed)
Subjective:   No events overnight  James [name] states he is doing "fine," reports his pain is well-controlled, and the swelling in his foot has gone down. Reports he was supposed to follow-up with his podiatrist tomorrow. Discussed that we are working on plan for duration of antibiotics. Cath today. States he already has home health with [facility].   Objective:  Vital signs in last 24 hours: Vitals:   08/08/20 1409 08/08/20 1701 08/08/20 2053 08/09/20 0626  BP:  (!) 175/89 (!) 121/94 (!) 178/96  Pulse:  (!) 104 (!) 111 (!) 110  Resp:  18 20 20   Temp:  98.7 F (37.1 C) 98.5 F (36.9 C) 98.3 F (36.8 C)  TempSrc:  Oral Oral Oral  SpO2:  100% 96% 99%  Weight:      Height: 5\' 9"  (1.753 m)      Physical Exam Constitutional:      General: He is not in acute distress.    Appearance: He is ill-appearing.  Pulmonary:     Effort: Pulmonary effort is normal.     Breath sounds: Normal breath sounds. No wheezing, rhonchi or rales.  Abdominal:     General: Abdomen is flat. Bowel sounds are normal. There is no distension.     Palpations: Abdomen is soft.     Tenderness: There is no abdominal tenderness.  Musculoskeletal:     Comments: RLE BKA, LLE TMA, tender over plantar aspect.   Skin:    General: Skin is warm and dry.  Neurological:     Mental Status: He is alert and oriented to person, place, and time.  Psychiatric:        Mood and Affect: Mood normal.        Behavior: Behavior normal.     Assessment/Plan:  [name] a 50 y.o.with pertinent PMH of s/p failed transplant on ESRD HD MWF followed by [facility], lymphoma 2013, HTN, BPH, prostate cancer, T2DM, PVD s/p right BKA and left TMAwho presented with shortness of breath and altered mental statusand admittedfor acute hypoxic respiratory failure, troponemia, and electrolyte abnormalities.  #Leukocytosis #Fever  WBC of 8.4>9.4 this am. ID recommended continue cefepime. Plan to continue abx on  outpatient basis until patient follows up with his surgeons at [facility]. Patient endorses follow up appointment with surgeons tomorrow, 1/19, however, I am uncertain patient would make this on time pending further work up by cardiology. If cleared on cath and no further work up, will try to discharge in time to make appointment.  - Continue Vancomycin changed to cefepime yesterday. 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 Will continue abx at this time. -wound care eval - daily dressing changes, apply xeroform gauze. -follow up with surgeons once discharge for suture removal -PT/OT recommended SNF, patient would prefer home health.  #Anemia Morning Hgb of 8.7>8.9. He has been given a total of 2 units of PRBC during his admission. Uncertain etiology, but suspect post operative anemia. Will continue to monitor. No clear evidence source of bleeding. -no acute bleed per colonoscopy/endoscopy -daily cbc -transfuse hgb under 7.0 -hold anticoagulants  #ESRD on Dialysis MWF #Hx of Failed Kidney Transplant 2018 #Hyperkalemia Dialysis session yesterday. Will trend electrolytes -continue prednisone and tacrolimus in context of renal transplant -per nephro  #Troponemia Elevated troponin's upon admission. Denies recent episodes of chest pain/shortness of breath. Continue to suspect secondary to  demand ischemia in setting of uncontrolled HTN. Cleared by ortho for cath - Cards planning for left heart cath today  #New Systolic HF  EF 65-78%, LV moderately decreased function. Global hypokinesis. Appears euvolemic on exam today.  -left heart cath today  #Sinus Tachycardia #QT Prolongation Hx of sinus tachycardia, resumed home metoprolol 50 mg q6h. HR has improved since restarting.  -continue to monitor on tele -avoid qtc prolonging  medications  #Hx of HTN Home medications of amlodipine, lisinopril.  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 Negative GI workup, no source of bleeding found during colonoscopy/endoscopy.  -continue heparin  #Acute Hypoxic Respiratory Failure - Resolved #Chest Pain Saturating well on room air, currently denies shortness of breath. Denies new episodes of chest pain.  -CTA chest negative for aortic dissection -dialysis per nephro  #Acute Encephalopathy - Resolved #Anion Gap Metabolic Acidosis Resolved Encephalopathy has resolved. AG of 15  -continue to monitor mental status -dialysis per nephro.  -daily bmp  Diet: Renal IVF: None,None VTE: SCDs Code: Full PT/OT: Home Health PT  Dispo: Discharge to home with home health, pending further evaluations from cardiology  [place]  Internal Medicine Resident PGY-1 [place]  Pager: [phone]   Please contact the on call pager after 5 pm and on weekends at [phone].

## 2020-08-09 NOTE — Progress Notes (Addendum)
Progress Note  Patient Name: James [name] Date of Encounter: 08/09/2020  [facility] Cardiologist: Mertie Moores, MD   Subjective   Feeling fine this morning. No complaints of chest pain or SOB. No palpitations. Pain control is tolerable. No questions regarding plans for LHC today.   Inpatient Medications    Scheduled Meds: . sodium chloride   Intravenous Once  . sodium chloride   Intravenous Once  . amLODipine  10 mg Oral Daily  . carvedilol  12.5 mg Oral BID WC  . Chlorhexidine Gluconate Cloth  6 each Topical Q0600  . darbepoetin (ARANESP) injection - DIALYSIS  100 mcg Intravenous Q Mon-HD  . insulin aspart  0-6 Units Subcutaneous TID WC  . lidocaine  1 patch Transdermal Q24H  . mupirocin ointment  1 application Nasal BID  . predniSONE  5 mg Oral Q breakfast  . sodium chloride flush  3 mL Intravenous Q12H  . tacrolimus  0.5 mg Oral Daily   Continuous Infusions: . sodium chloride    . sodium chloride 10 mL/hr at 08/09/20 0634  . ceFEPime (MAXIPIME) IV Stopped (08/08/20 1838)  . heparin 1,300 Units/hr (08/09/20 4098)  . vancomycin     PRN Meds: sodium chloride, acetaminophen **OR** acetaminophen, oxyCODONE-acetaminophen, polyethylene glycol, sodium chloride flush   Vital Signs    Vitals:   08/08/20 1409 08/08/20 1701 08/08/20 2053 08/09/20 0626  BP:  (!) 175/89 (!) 121/94 (!) 178/96  Pulse:  (!) 104 (!) 111 (!) 110  Resp:  18 20 20   Temp:  98.7 F (37.1 C) 98.5 F (36.9 C) 98.3 F (36.8 C)  TempSrc:  Oral Oral Oral  SpO2:  100% 96% 99%  Weight:      Height: 5\' 9"  (1.753 m)       Intake/Output Summary (Last 24 hours) at 08/09/2020 0824 Last data filed at 08/09/2020 0634 Gross per 24 hour  Intake 351.95 ml  Output 2000 ml  Net -1648.05 ml   Last 3 Weights 08/08/2020 08/08/2020 08/08/2020  Weight (lbs) 136 lb 11 oz 141 lb 5 oz 146 lb 6.2 oz  Weight (kg) 62 kg 64.1 kg 66.4 kg      Telemetry    Sinus rhythm/sinus tachycardia with occasional PACs/PVCs  - Personally Reviewed  ECG    No new tracings - Personally Reviewed  Physical Exam   GEN: No acute distress.   Neck: No JVD Cardiac: RRR, no murmurs, rubs, or gallops.  Respiratory: Clear to auscultation bilaterally. 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 Neuro:  Nonfocal  Psych: Normal affect   Labs    High Sensitivity Troponin:   Recent Labs  Lab 08/01/20 1020 08/01/20 1555 08/01/20 1828  TROPONINIHS 1,049* 1,925* 1,964*      Chemistry Recent Labs  Lab 08/03/20 1248 08/04/20 0249 08/05/20 0225 08/06/20 0825 08/07/20 0001 08/08/20 0800  NA 136 139   < > 137 138 136  K 2.9* 4.2   < > 3.8 3.4* 3.7  CL 95* 97*   < > 98 96* 97*  CO2 27 28   < > 18* 27 23  GLUCOSE 87 86   < > 88 131* 86  BUN 10 17   < > 49* 22* 43*  CREATININE 2.94* 5.33*   < > 10.63* 5.66* 9.35*  CALCIUM 9.2 9.6   < > 9.5 9.1 10.0  PROT  --  7.5  --   --   --   --  ALBUMIN 2.5* 2.3*  --   --   --  2.3*  AST  --  38  --   --   --   --   ALT  --  36  --   --   --   --   ALKPHOS  --  182*  --   --   --   --   BILITOT  --  0.7  --   --   --   --   GFRNONAA 26* 13*   < > 5* 12* 6*  ANIONGAP 14 14   < > 21* 15 16*   < > = values in this interval not displayed.     Hematology Recent Labs  Lab 08/07/20 0001 08/08/20 0800 08/09/20 0759  WBC 8.4 8.8 9.4  RBC 2.64* 2.47* 2.63*  HGB 8.7* 8.3* 8.9*  HCT 27.4* 26.0* 27.9*  MCV 103.8* 105.3* 106.1*  MCH 33.0 33.6 33.8  MCHC 31.8 31.9 31.9  RDW 18.1* 17.0* 16.9*  PLT 168 142* 153    BNPNo results for input(s): BNP, PROBNP in the last 168 hours.   DDimer No results for input(s): DDIMER in the last 168 hours.   Radiology    No results found.  Cardiac Studies   Echocardiogram 08/03/20: 1. EF estimate limited as only para sternal images obtained no apical or sub costal images done due to patient pain and lack of cooperation . Left ventricular ejection fraction, by estimation, is 35  to 40%. The left ventricle has moderately decreased function. The left ventricle demonstrates global hypokinesis. The left ventricular internal cavity size was moderately dilated. There is moderate  left ventricular hypertrophy.  2. Left atrial size was moderately dilated.  3. The mitral valve is abnormal. Mild mitral valve regurgitation.  Moderate mitral annular calcification.  4. Calcified non coronary cusp. The aortic valve was not well visualized. 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 Complex PMH as outlined in consult note. Recently in the hospital for ischemic foot s/p L TMA. He presented to the hospital1/10/2022after having a fall and being found confused by his aide that arrived, also with some recollection of having had chest pain. He had been confirmed compliant with last scheduled HD session on Friday. He was hypertensive and tachycardic (sinus) on arrival. He required BiPAP on arrival and quickly improved with NTG paste and BiPAP. CT angio ruled out dissection. Cardiology asked to see for elevated troponin. Other issues this admission include worsening anemia + FOBT, waxing/waning encephalopathy and fever. 2D Echo demonstrates new LV dysfunction.  Assessment & Plan    1. NSTEMI: patient presented after being found down at home by his HHA. Reportedly had some recent chest pain. He initially required BiPAP on arrival. HsTrop peaked at 1964.  EKG without ischemic changes. CTA chest negative for aortic dissection. Echo this admission showed EF 35-40% with global hypokinesis, moderate LV dilation, moderate LVH, moderate LAE, and mild MR. LHC delayed in the setting of acute on chronic anemia with +FOBT requiring transfusion of PRBC. EGD/C-Scope without source of bleeding and  cleared for anticoagulation from GI. Hgb stable at 8.3 today. Additionally with c/f possible osteomyelitis, though ortho states MRI is c/w post surgical changes. Risk factors for CAD include HTN, DM type 2, and PVD. Metoprolol transitioned to carvedilol for better BP control - Plan for LHC today - keep NPO - Continue heparin gtt for now - Continue carvedilol - Will start statin in light of NSTEMI, though LDL is only 36.   2. 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 Prior to this, his last echo was in 2017 with EF 55-60%, G1DD, no RWMA. Etiology remains unclear. Possible this is HTN mediated, though cannot exclude ischemia. Thankfully he has room in his blood pressure for management of his CHF, though CKD limits GDMT.  - Will increase carvedilol to 25mg  BID - Further med adjustments pending LHC results - Continue volume management per Nephrology with HD  3. HTN: Poorly controlled. Patient denies trouble with hypotension at HD.  - Anticipate management in the context of #2  4. Acute on chronic anemia: Hgb dropped to 6.7 this admission. He received 1 uPRBC. FOBT positive this admission. He went for EGD/C-Scope which was without evidence of bleeding though did show some gastritis. Cleared to restart anticoagulation. Hgb stabe at 8.9 today - Continue close monitoring of Hgb  5. PVD s/p right BKA and left TMA: more recently underwent TMA 07/20/20. Noted to have increased drainage and c/f infection. Ortho following. MRI results reviewed and felt to be more c/w post-surgical changes than osteomyelitis. ID following as well. Patient is currently on IV  antibiotics  - Continue management per primary team, ortho, and ID  6. Hx of splenic infarct: home coumadin held on admission given c/f GI bleed. Now s/p EGD/C-scope which was negative, cleared to restart anticoagulation per GI. He remains on a heparin gtt until clear no further procedures are necessary - Continue heparin gtt - Anticipate restarting coumadin once procedures completed  7. ESRD on HD: s/p failed kidney transplant. On HD M/W/F - Continue prednisone and tacrolimus  - Continue HD per nephrology       For questions or updates, please contact [place] Please consult [email or web address] for contact info under        Signed, Abigail Butts, PA-C  08/09/2020, 8:24 AM    Personally seen and examined. Agree with APP above with the following comments: Briefly 50 yo M with ESRD and PAD with chest pain and NSTEMI with anemia and new HFrEF Patient notes no pain or discomfor Exam notable for no R femoral hematoma Labs notable for no significant barriers for LCP Personally reviewed relevant tests; widely patent CORs; mild non-obstructive plaque in the RCA Would recommend Coreg and hydralazine state 08/09/20 for afterload reduction Possible 08/10/2020 sign off/discharge  Werner Lean, MD

## 2020-08-09 NOTE — Interval H&P Note (Signed)
History and Physical Interval Note:  08/09/2020 9:26 AM  [place]  has presented today for surgery, with the diagnosis of nstemi-cardiomyopathy.  The various methods of treatment have been discussed with the patient and family. After consideration of risks, benefits and other options for treatment, the patient has consented to  Procedure(s): LEFT HEART CATH AND CORONARY ANGIOGRAPHY (N/A)  PERCUTANEOUS CORONARY INTERVENTION  as a surgical intervention.  The patient's history has been reviewed, patient examined, no change in status, stable for surgery.  I have reviewed the patient's chart and labs.  Questions were answered to the patient's satisfaction.    Cath Lab Visit (complete for each Cath Lab visit)  Clinical Evaluation Leading to the Procedure:   ACS: Yes.    Non-ACS:    Anginal Classification: CCS III  Anti-ischemic medical therapy: Maximal Therapy (2 or more classes of medications)  Non-Invasive Test Results: No non-invasive testing performed however  moderate-severely reduced EF on echo  Prior CABG: No previous CABG    Glenetta Hew

## 2020-08-09 NOTE — Progress Notes (Signed)
[facility] Progress Note     Assessment/ Plan:   ESRD: -h/o failed txp -outpatient orders [facility]: MWF F200, 3.5HRS, 350/700. 2K/2.5Ca. Heparin 1k units bolus -maintain mwf here -continue with home pred and tacrolimus  NSTEMI - LHC today w/o sig obstructive CAD  New CHF -ef now 35-40%, cardiology on board. UF as tolerated  Tachyarrhythmia, improved. Sinus tach -rapid response called in dialysis unit 1/12, held further UF, improved with valsalva. Looked like SVT initially on tele w/ HR up to the 180s, sinus tach on ekg. -needs to get BB prior to HD  Leukocytosis/fevers -avf looks good, likely not a source -questionable OM however per ortho consistent with post op changes - RCID following, rec cefepime utnil follows up outpt with surgeon  Volume/ hypertension/pulmonary edema: -[id number], UF as tolerated. Resume home anti-htns. SOB resolved  Anemia secondary to Chronic Kidney Disease and blood loss: -aranesp 60mcg every Wed outpatient. Increased aranesp to 144mcg, dose to be given monday -venofer once every 2 weeks. Iron panel ordered, holding on starting iron given infection eval -1u prbc on 1/11 -stool occult positive, GI on board, cscope and egd 1/14 -macrocytic, b12 high, folate ok -transfuse prn for hgb <7  Secondary Hyperparathyroidism/Hyperphosphatemia:resume home binders (on renvela), monitor phos (5.6 on 1/17) and PTH -hectorol 4.6mcg qtreatment  AMS/encephalopathy -improved  Splenic infarct -a/c per primary service, inr at goal  Vascular access:rue avf +b/t   Subjective:     No interval events  LHC today, no sig CAD and no PCI   Objective:   BP (!) 153/86   Pulse (!) 108   Temp 98.8 F (37.1 C) (Oral)   Resp 18   Ht 5\' 9"  (1.753 m)   Wt 62 kg   SpO2 97%   BMI 20.18 kg/m   Intake/Output Summary (Last 24 hours) at 08/09/2020 1558 Last data filed at 08/09/2020 0981 Gross per 24 hour  Intake 274.94  ml  Output --  Net 274.94 ml   Weight change: -2.3 kg  Physical Exam: Gen:nad, sitting up  in bed CVS:reg rate Resp: unlabored, no iwob XBJ:YNWG, nt/nd Ext:no edema, rt BKA, lt TMA Neuro: speech clear and coherent, moves all ext spontaneously, fatigued Access: rue avf  Imaging: CARDIAC CATHETERIZATION  Result Date: 08/09/2020  Prox RCA lesion is 30% stenosed.  LV end diastolic pressure is low.  There is no aortic valve stenosis.  SUMMARY  Essentially angiographically normal coronaries with minimal 30% RCA disease.  Codominant system  Despite reduced EF on echocardiogram, normal LVEDP RECOMMENDATION  Return to nursing for ongoing care  Evaluate for nonischemic causes of cardiomyopathy, titrate medications accordingly.  Okay to restart IV heparin 8 hours after sheath removal. Glenetta Hew, MD   Labs: BMET Recent Labs  Lab 08/03/20 0249 08/03/20 1248 08/04/20 0249 08/05/20 0225 08/06/20 0825 08/07/20 0001 08/08/20 0800 08/09/20 0759  NA 135 136 139 138 137 138 136 133*  K 4.2 2.9* 4.2 3.9 3.8 3.4* 3.7 4.0  CL 92* 95* 97* 96* 98 96* 97* 95*  CO2 25 27 28 23  18* 27 23 23   GLUCOSE 85 87 86 69* 88 131* 86 95  BUN 41* 10 17 31* 49* 22* 43* 32*  CREATININE 8.80* 2.94* 5.33* 7.99* 10.63* 5.66* 9.35* 6.91*  CALCIUM 9.5 9.2 9.6 10.0 9.5 9.1 10.0 10.2  PHOS 4.5 1.9*  --   --   --   --  5.6*  --    CBC Recent Labs  Lab 08/06/20 0307 08/07/20  0001 08/08/20 0800 08/09/20 0759  WBC 7.2 8.4 8.8 9.4  HGB 7.0* 8.7* 8.3* 8.9*  HCT 22.5* 27.4* 26.0* 27.9*  MCV 110.3* 103.8* 105.3* 106.1*  PLT 145* 168 142* 153    Medications:    . sodium chloride   Intravenous Once  . sodium chloride   Intravenous Once  . amLODipine  10 mg Oral Daily  . atorvastatin  40 mg Oral QHS  . carvedilol  25 mg Oral BID WC  . Chlorhexidine Gluconate Cloth  6 each Topical Q0600  . darbepoetin (ARANESP) injection - DIALYSIS  100 mcg Intravenous Q Mon-HD  . hydrALAZINE  25 mg Oral Q8H  .  insulin aspart  0-6 Units Subcutaneous TID WC  . lidocaine  1 patch Transdermal Q24H  . mupirocin ointment  1 application Nasal BID  . predniSONE  5 mg Oral Q breakfast  . sodium chloride flush  3 mL Intravenous Q12H  . sodium chloride flush  3 mL Intravenous Q12H  . tacrolimus  0.5 mg Oral Daily    Rexene Agent, MD  08/09/2020, 3:58 PM

## 2020-08-09 NOTE — Progress Notes (Signed)
[place] for IV heparin Indication: history of splenic infarct  Allergies  Allergen Reactions  . Tramadol Hcl Shortness Of Breath and Other (See Comments)    Tramadol HCl = Shortness of breath and Tramadol = Rash  . Nsaids Other (See Comments)    Was told by MD to not take these  . Darvon [Propoxyphene] Rash and Other (See Comments)    Broke out on eyelid     Patient Measurements: Height: 5\' 9"  (175.3 cm) Weight: 62 kg (136 lb 11 oz) IBW/kg (Calculated) : 70.7 Heparin Dosing Weight: 65.5 kg  Vital Signs: Temp: 98.3 F (36.8 C) (01/18 0626) Temp Source: Oral (01/18 0626) BP: 178/96 (01/18 0626) Pulse Rate: 110 (01/18 0626)  Labs: Recent Labs    08/06/20 0825 08/07/20 0001 08/07/20 1002 08/07/20 2243 08/08/20 0800 08/08/20 2206  HGB  --  8.7*  --   --  8.3*  --   HCT  --  27.4*  --   --  26.0*  --   PLT  --  168  --   --  142*  --   HEPARINUNFRC  --  1.00*   < > <0.10* <0.10* <0.10*  CREATININE 10.63* 5.66*  --   --  9.35*  --    < > = values in this interval not displayed.    Estimated Creatinine Clearance: 8.6 mL/min (A) (by C-G formula based on SCr of 9.35 mg/dL (H)).   Medical History: Past Medical History:  Diagnosis Date  . Anemia   . Avascular necrosis ([place])   . BPH (benign prostatic hyperplasia)   . Complication of anesthesia    slow to wake up  . ESRD (end stage renal disease) ([place])    failed kidney transplant  . GERD (gastroesophageal reflux disease)   . Glaucoma   . History of blood transfusion   . Hypertension   . Hypertriglyceridemia   . Influenza A   . 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 Peripheral vascular disease ([place])     s/p prior R BKA 07/2019 and recent L TMA 06/2020  . Pneumonia 04/21/2017   several times per pt on 04/22/20  . Prolonged QT interval   . Prostate cancer ([place])    prostate cancer, s/p prostatectomy 04/24/19   . Renal disorder    dialysis m w f  . 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 He then got HD in [facility] until getting a renal transplant in 2009 at [facility], [place].  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    . Smoker    tx wellbutriin  . 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 Tachycardia   . Thrombocytopenia ([place])   . Transplant recipient   . Type 2 diabetes mellitus ([place])    type 2 - pt states no meds, diet controlled as of 04/22/20    Medications:  Medications Prior to Admission  Medication Sig Dispense Refill Last Dose  . acetaminophen (TYLENOL) 500 MG tablet Take 500-1,000 mg by mouth every 6 (six) hours as needed (pain or headaches).    Past Week at Unknown time  .  amLODipine (NORVASC) 10 MG tablet Take 1 tablet (10 mg total) by mouth daily. 30 tablet 0 Past Month at Unknown time  . aspirin EC 81 MG tablet Take 81 mg by mouth daily. Swallow whole.   Past Month at Unknown time  . diphenhydrAMINE (BENADRYL) 25 mg capsule Take 25 mg by mouth every 6 (six) hours as needed for itching.    unk  . warfarin (COUMADIN) 5 MG tablet Take 5-7.5 mg by mouth See admin instructions. Take 1.5 tablets (7.5 mg) by mouth in the evenings on Mondays, Wednesdays, & Fridays. Take 1 tablet (5 mg) by mouth in the evening on Sundays, Tuesdays, Thursdays, & Saturdays.   07/19/2020  . buPROPion (WELLBUTRIN XL) 150 MG 24 hr tablet Take 150 mg by mouth daily.     . Cyanocobalamin (B-12) 500 MCG TABS Take 500 mcg by mouth daily.      Marland Kitchen doxazosin (CARDURA) 2 MG tablet Take 2 mg by mouth at bedtime.     . ferric citrate (AURYXIA) 1 GM 210 MG(Fe) tablet Take 210-420 mg by mouth See admin instructions. Take 2 tablets (420 mg) by mouth up to three times daily with meals and  1 tablet (210 mg) with snacks     . gabapentin (NEURONTIN) 100 MG capsule Take 100 mg by mouth at bedtime.     . insulin aspart (NOVOLOG FLEXPEN) 100 UNIT/ML FlexPen Inject 10 Units into the skin 3 (three) times daily with meals. (Patient taking differently: Inject 2 Units into the skin 3 (three) times daily as needed for high blood sugar (CBG >180).) 15 mL 11   . Insulin Detemir (LEVEMIR FLEXTOUCH) 100 UNIT/ML Pen Inject 15 Units into the skin at bedtime. (Patient not taking: Reported on 08/01/2020) 15 mL 11 Not Taking at Unknown time  . latanoprost (XALATAN) 0.005 % ophthalmic solution Place 1 drop into both eyes at bedtime.      Marland Kitchen lisinopril (ZESTRIL) 10 MG tablet Take 10 mg by mouth at bedtime.     Marland Kitchen loperamide (IMODIUM) 2 MG capsule Take 2 mg by mouth as needed for diarrhea or loose stools.      . Melatonin 5 MG CAPS Take 5 mg by mouth at bedtime as needed (sleep).     . metoprolol tartrate (LOPRESSOR) 100 MG tablet Take 100 mg by mouth 2 (two) times daily.      Marland Kitchen omeprazole (PRILOSEC) 20 MG capsule Take 20 mg by mouth daily.      Marland Kitchen oxyCODONE (OXY IR/ROXICODONE) 5 MG immediate release tablet Take 5 mg by mouth every 6 (six) hours as needed (pain).     Marland Kitchen oxyCODONE-acetaminophen (PERCOCET) 5-325 MG tablet Take 1 tablet by mouth every 6 (six) hours as needed. 20 tablet 0   . predniSONE (DELTASONE) 5 MG tablet Take 5 mg by mouth daily with breakfast.      . tacrolimus (PROGRAF) 0.5 MG capsule Take 0.5 mg by mouth daily.  (Patient not taking: Reported on 08/02/2020)   Not Taking at Unknown time  . triamcinolone cream (KENALOG) 0.1 % Apply 1 application topically daily. (dermatitis) After bathing     . valGANciclovir (VALCYTE) 450 MG tablet Take 450 mg by mouth every Monday, Wednesday, and Friday with hemodialysis.   5   . zolpidem (AMBIEN) 10 MG tablet Take 10 mg by mouth at bedtime as needed for sleep.        Assessment: 50 year old male admitted for acute hypoxic respiratory failure with PMH of  ESRD on  hemodialysis, history of failed transplant, history of lymphoma, hypertension, BPH, prostate cancer, type 2 diabetes, peripheral vascular disease, right BKA and recent left TMA. Pharmacy consulted to start IV heparin for history of splenic infarct.  Prior to admission warfarin for history of splenic infarct. Home regimen 7.5 mg on MWF and 5 mg rest of week, last dose 07/19/20.  Warfarin held since admission due to FOBT positive and suspected bleed. Workup with colonoscopy and EGD, cleared to resume anticoagulation per GI.   Heparin level is still undetectable <0.1 overnight. Cath this am did not show ischemia. Heparin to restart tonight.   Hgb 8.9 today, platelets wnl.  Goal of Therapy:  Heparin level 0.3-0.7 units/ml Monitor platelets by anticoagulation protocol: Yes   Plan:  Restart IV heparin at 1300 units/hr Heparin level in 8 hours Daily heparin level, CBC Monitor s/sx bleeding F/u transition back to warfarin after procedures  Erin Hearing PharmD., BCPS Clinical Pharmacist 08/09/2020 7:44 AM

## 2020-08-09 NOTE — Progress Notes (Signed)
PT Cancellation Note  Patient Details Name: James [name] MRN: [id number] DOB: Feb 16, 1971   Cancelled Treatment:    Reason Eval/Treat Not Completed: Patient at procedure or test/unavailable. Pt left for cath lab this morning. Pt now on bedrest until 3pm. PT to return as able to progress mobility.  Kittie Plater, PT, DPT Acute Rehabilitation Services Pager #: [phone] Office #: [phone]    Berline Lopes 08/09/2020, 11:45 AM

## 2020-08-10 ENCOUNTER — Other Ambulatory Visit (HOSPITAL_COMMUNITY): Payer: Self-pay | Admitting: Student

## 2020-08-10 DIAGNOSIS — M8619 Other acute osteomyelitis, multiple sites: Secondary | ICD-10-CM | POA: Diagnosis not present

## 2020-08-10 DIAGNOSIS — J810 Acute pulmonary edema: Secondary | ICD-10-CM | POA: Diagnosis not present

## 2020-08-10 DIAGNOSIS — N186 End stage renal disease: Secondary | ICD-10-CM | POA: Diagnosis not present

## 2020-08-10 DIAGNOSIS — T8744 Infection of amputation stump, left lower extremity: Secondary | ICD-10-CM | POA: Diagnosis not present

## 2020-08-10 DIAGNOSIS — D509 Iron deficiency anemia, unspecified: Secondary | ICD-10-CM | POA: Diagnosis not present

## 2020-08-10 DIAGNOSIS — L02612 Cutaneous abscess of left foot: Secondary | ICD-10-CM | POA: Diagnosis not present

## 2020-08-10 DIAGNOSIS — G9340 Encephalopathy, unspecified: Secondary | ICD-10-CM | POA: Diagnosis not present

## 2020-08-10 DIAGNOSIS — R000 Tachycardia, unspecified: Secondary | ICD-10-CM | POA: Diagnosis not present

## 2020-08-10 LAB — GLUCOSE, CAPILLARY
Glucose-Capillary: 85 mg/dL (ref 70–99)
Glucose-Capillary: 169 mg/dL — ABNORMAL HIGH (ref 70–99)
Glucose-Capillary: 145 mg/dL — ABNORMAL HIGH (ref 70–99)
Glucose-Capillary: 158 mg/dL — ABNORMAL HIGH (ref 70–99)
Glucose-Capillary: 116 mg/dL — ABNORMAL HIGH (ref 70–99)

## 2020-08-10 LAB — BASIC METABOLIC PANEL
Anion gap: 16 — ABNORMAL HIGH (ref 5–15)
BUN: 51 mg/dL — ABNORMAL HIGH (ref 6–20)
CO2: 21 mmol/L — ABNORMAL LOW (ref 22–32)
Calcium: 9.7 mg/dL (ref 8.9–10.3)
Chloride: 97 mmol/L — ABNORMAL LOW (ref 98–111)
Creatinine, Ser: 9.74 mg/dL — ABNORMAL HIGH (ref 0.61–1.24)
GFR, Estimated: 6 mL/min — ABNORMAL LOW (ref >60)
Glucose, Bld: 90 mg/dL (ref 70–99)
Potassium: 4.3 mmol/L (ref 3.5–5.1)
Sodium: 134 mmol/L — ABNORMAL LOW (ref 135–145)

## 2020-08-10 LAB — CBC
HCT: 26.3 % — ABNORMAL LOW (ref 39.0–52.0)
Hemoglobin: 8.2 g/dL — ABNORMAL LOW (ref 13.0–17.0)
MCH: 33.7 pg (ref 26.0–34.0)
MCHC: 31.2 g/dL (ref 30.0–36.0)
MCV: 108.2 fL — ABNORMAL HIGH (ref 80.0–100.0)
Platelets: 129 10*3/uL — ABNORMAL LOW (ref 150–400)
RBC: 2.43 MIL/uL — ABNORMAL LOW (ref 4.22–5.81)
RDW: 16.7 % — ABNORMAL HIGH (ref 11.5–15.5)
WBC: 7.5 10*3/uL (ref 4.0–10.5)
nRBC: 0 % (ref 0.0–0.2)

## 2020-08-10 LAB — HEPARIN LEVEL (UNFRACTIONATED)
Heparin Unfractionated: 0.17 IU/mL — ABNORMAL LOW (ref 0.30–0.70)
Heparin Unfractionated: <0.1 IU/mL — ABNORMAL LOW (ref 0.30–0.70)

## 2020-08-10 MED ORDER — DARBEPOETIN ALFA 100 MCG/0.5ML IJ SOSY: 100.0000 ug | PREFILLED_SYRINGE | INTRAMUSCULAR | Status: AC

## 2020-08-10 MED ORDER — VANCOMYCIN HCL 750 MG/150ML IV SOLN: 750.0000 mg | INTRAVENOUS | Status: DC

## 2020-08-10 MED ORDER — WARFARIN - PHARMACIST DOSING INPATIENT: Freq: Every day | Status: DC

## 2020-08-10 MED ORDER — WARFARIN SODIUM 7.5 MG PO TABS
7.5000 mg | ORAL_TABLET | Freq: Once | ORAL | Status: AC
  Administered 2020-08-10: 7.5 mg via ORAL
  Filled 2020-08-10: qty 1

## 2020-08-10 MED ORDER — LUBRIDERM SERIOUSLY SENSITIVE EX LOTN
1.0000 "application " | TOPICAL_LOTION | Freq: Two times a day (BID) | CUTANEOUS | Status: DC
  Filled 2020-08-10: qty 473

## 2020-08-10 MED ORDER — LIDOCAINE 5 % EX PTCH: 1.0000 | MEDICATED_PATCH | CUTANEOUS | 0 refills | Status: DC

## 2020-08-10 MED ORDER — ATORVASTATIN CALCIUM 40 MG PO TABS: 40.0000 mg | ORAL_TABLET | Freq: Every day | ORAL | 0 refills | Status: DC

## 2020-08-10 MED ORDER — ISOSORBIDE MONONITRATE ER 30 MG PO TB24: 30.0000 mg | ORAL_TABLET | Freq: Every day | ORAL | 0 refills | Status: DC

## 2020-08-10 MED ORDER — SODIUM CHLORIDE 0.9 % IV SOLN: 2.0000 g | INTRAVENOUS | Status: DC

## 2020-08-10 MED ORDER — ISOSORBIDE MONONITRATE ER 30 MG PO TB24
30.0000 mg | ORAL_TABLET | Freq: Every day | ORAL | Status: DC
  Administered 2020-08-10: 30 mg via ORAL
  Filled 2020-08-10: qty 1

## 2020-08-10 MED ORDER — HYDRALAZINE HCL 50 MG PO TABS
50.0000 mg | ORAL_TABLET | Freq: Three times a day (TID) | ORAL | Status: DC
  Administered 2020-08-10: 50 mg via ORAL
  Filled 2020-08-10: qty 1

## 2020-08-10 MED ORDER — HYDRALAZINE HCL 50 MG PO TABS: 50.0000 mg | ORAL_TABLET | Freq: Three times a day (TID) | ORAL | 0 refills | Status: DC

## 2020-08-10 MED ORDER — CARVEDILOL 25 MG PO TABS: 25.0000 mg | ORAL_TABLET | Freq: Two times a day (BID) | ORAL | 2 refills | Status: DC

## 2020-08-10 MED FILL — Verapamil HCl IV Soln 2.5 MG/ML: INTRAVENOUS | Qty: 2 | Status: AC

## 2020-08-10 MED FILL — LIDOCAINE PATCH 5%: 5 | 30 days supply | Qty: 30 | Fill #0

## 2020-08-10 MED FILL — CARVEDILOL 25 MG TABLET: 25 | 30 days supply | Qty: 60 | Fill #0

## 2020-08-10 MED FILL — ISOSORBIDE MN ER 30 MG TAB: 30 | 30 days supply | Qty: 30 | Fill #0

## 2020-08-10 MED FILL — ATORVASTATIN CALCIUM 40 MG: 40 | 30 days supply | Qty: 30 | Fill #0

## 2020-08-10 MED FILL — hydrALAZINE HCL 50 MG TABS: 50 | 30 days supply | Qty: 90 | Fill #0

## 2020-08-10 NOTE — Progress Notes (Signed)
Subjective:   No events overnight  James [name] states he is doing well. He notes he is ready to be discharged home as long as we feel it is safe. I informed him I spoke with his podiatrist today and discussed his current hospitalization.  I also updated him that nephrology, ID, and IMTS had a group discussion today concerning patient's follow up. He agrees with the plan. He continues to endorse wanting to have home health and not attend SNF facility due to worry that he will get COVID.   Objective:  Vital signs in last 24 hours: Vitals:   08/10/20 1220 08/10/20 1224 08/10/20 1449 08/10/20 1637  BP: (!) 142/82 (!) 142/82 127/88 139/79  Pulse:   (!) 103 (!) 114  Resp:  20 19   Temp: 97.9 F (36.6 C)  98 F (36.7 C)   TempSrc: Oral     SpO2: 98%  99%   Weight: 62.5 kg     Height:       Physical Exam Constitutional:      General: He is not in acute distress.    Appearance: He is ill-appearing.  Cardiovascular:     Rate and Rhythm: Normal rate and regular rhythm.  Pulmonary:     Effort: Pulmonary effort is normal.     Breath sounds: Normal breath sounds. No wheezing, rhonchi or rales.  Abdominal:     General: Abdomen is flat. Bowel sounds are normal. There is no distension.     Palpations: Abdomen is soft.     Tenderness: There is no abdominal tenderness.  Musculoskeletal:     Comments: RLE BKA, LLE TMA, tender over plantar aspect.   Skin:    General: Skin is warm and dry.  Neurological:     Mental Status: He is alert and oriented to person, place, and time.  Psychiatric:        Mood and Affect: Mood normal.        Behavior: Behavior normal.     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  #Leukocytosis #Fever  WBC of 9.4>8.2 this am. Patient remains afebrile. ID recommending 6 week antibiotic course of vanco and cefepime with HD sessions. Patient to follow up with Dr. Tommy Medal in his office on 08/25/20. Appreciate his time and recommendations. - Continue Vancomycin changed to cefepime yesterday. 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 Will continue abx at this time as per above. Spoke with Dr. Gean Quint patient's podiatrist and updated him of the current hospitalization and plan. He agrees to follow up with the patient on outpatient basis. Patient to be discharged and follow up with his podiatrist. Patient prefers home health with [facility] whom he was being followed by prior to his admission. -wound care eval - daily dressing changes, apply xeroform gauze. -follow up with surgeons once discharge for suture removal -PT/OT recommended SNF, patient would prefer home health.  #Anemia Morning Hgb of 8.9>8.2. He has been given a total of 2 units of PRBC during his admission. Uncertain etiology, but suspect post operative anemia. Will continue to monitor. No clear  evidence source of bleeding. Patient to follow up with PCP. -no acute bleed per colonoscopy/endoscopy -daily cbc -transfuse hgb under 7.0 -hold anticoagulants  #ESRD on Dialysis MWF #Hx of Failed Kidney Transplant 2018 #Hyperkalemia Dialysis session today. Patient to be discharged and resume schedule, next session on Saturday. Will trend electrolytes -continue prednisone and tacrolimus in outpatient setting  #Troponemia Elevated troponin's upon admission. Cardiology on board, catheterized yesterday normal coronaries with minimal 30% RCA disease. Codominant system. - Cath without significant blockage, no intervention  needed. - follow with cards on outpatient basis.   #New Systolic HF  EF 54-36%, LV moderately decreased function. Global hypokinesis. Appears euvolemic on exam today.  -cardiology started imdur during admission -left heart cath results as per above  #Sinus Tachycardia #QT Prolongation Hx of sinus tachycardia, cardiology changed patient from metop to carvedilol. Continue carvedilol.  -continue to monitor on tele  -avoid qtc prolonging medications  #Hx of HTN Home medications of amlodipine, lisinopril.  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 Negative GI workup, no source of bleeding found during colonoscopy/endoscopy.  -continue heparin, will switch to warfarin for him to continue on an outpatient basis.   #Acute Hypoxic Respiratory Failure - Resolved #Chest Pain Saturating well on room air, currently denies shortness of breath. Denies new episodes of chest pain.  -CTA chest negative for aortic dissection -dialysis per nephro  #Acute Encephalopathy - Resolved #Anion Gap Metabolic Acidosis Resolved Encephalopathy has resolved. Patient to follow up with renal on outpatient basis.  -continue to monitor mental status -dialysis per nephro. Will continue on outpatient basis. Will discontinue prograf as patient has not been taking on outpatient basis and has refused since admission.  -daily bmp  Diet: Renal IVF: None,None VTE: SCDs Code: Full PT/OT: Home Health PT  Dispo: Discharge to home today after dialysis.   Sanjuana Letters DO  Internal Medicine Resident PGY-1 [place]  Pager: [phone]   Please contact the on call pager after 5 pm and on weekends at [phone].

## 2020-08-10 NOTE — Progress Notes (Signed)
[facility] Progress Note     Assessment/ Plan:   ESRD: -h/o failed txp -outpatient orders [facility]: MWF F200, 3.5HRS, 350/700. 2K/2.5Ca. Heparin 1k units bolus - for HD today -continue with home pred, is not taking tac  NSTEMI - LHC 1/18 w/o sig obstructive CAD  New CHF -ef now 35-40%, cardiology on board. UF as tolerated  Tachyarrhythmia, improved. Sinus tach -rapid response called in dialysis unit 1/12, held further UF, improved with valsalva. Looked like SVT initially on tele w/ HR up to the 180s, sinus tach on ekg. -needs to get BB prior to HD  Leukocytosis/fevers/ OM of TMA -will rec Cefepime and Vanc x6wk and f/u with surgery as outpt, [facility] assisting  Volume/ hypertension/pulmonary edema: -[id number], UF as tolerated. Resume home anti-htns. SOB resolved  Anemia secondary to Chronic Kidney Disease and blood loss: -aranesp 64mcg every Wed outpatient. Increased aranesp to 159mcg, dose to be given monday -venofer once every 2 weeks. Iron panel ordered, holding on starting iron given infection eval -1u prbc on 1/11 -stool occult positive, GI on board, cscope and egd 1/14 -macrocytic, b12 high, folate ok -transfuse per primary  Secondary Hyperparathyroidism/Hyperphosphatemia:resume home binders (on renvela), monitor phos (5.6 on 1/17) and PTH -hectorol 4.13mcg qtreatment  AMS/encephalopathy -improved  Splenic infarct -a/c per primary service, inr at goal  Vascular access:rue avf +b/t   Subjective:     No interval events  Possible DC today   Objective:   BP (!) 147/84 (BP Location: Left Arm)   Pulse (!) 103   Temp 98.3 F (36.8 C) (Oral)   Resp 18   Ht 5\' 9"  (1.753 m)   Wt 62 kg   SpO2 96%   BMI 20.18 kg/m   Intake/Output Summary (Last 24 hours) at 08/10/2020 1131 Last data filed at 08/09/2020 2137 Gross per 24 hour  Intake 710 ml  Output --  Net 710 ml   Weight change:   Physical Exam: Gen:nad,  sitting up  in bed CVS:reg rate Resp: unlabored, no iwob MHD:QQIW, nt/nd Ext:no edema, rt BKA, lt TMA Neuro: speech clear and coherent, moves all ext spontaneously, fatigued Access: rue avf  Imaging: CARDIAC CATHETERIZATION  Result Date: 08/09/2020  Prox RCA lesion is 30% stenosed.  LV end diastolic pressure is low.  There is no aortic valve stenosis.  SUMMARY  Essentially angiographically normal coronaries with minimal 30% RCA disease.  Codominant system  Despite reduced EF on echocardiogram, normal LVEDP RECOMMENDATION  Return to nursing for ongoing care  Evaluate for nonischemic causes of cardiomyopathy, titrate medications accordingly.  Okay to restart IV heparin 8 hours after sheath removal. Glenetta Hew, MD   Labs: BMET Recent Labs  Lab 08/03/20 1248 08/04/20 0249 08/05/20 0225 08/06/20 0825 08/07/20 0001 08/08/20 0800 08/09/20 0759 08/10/20 0857  NA 136 139 138 137 138 136 133* 134*  K 2.9* 4.2 3.9 3.8 3.4* 3.7 4.0 4.3  CL 95* 97* 96* 98 96* 97* 95* 97*  CO2 27 28 23  18* 27 23 23  21*  GLUCOSE 87 86 69* 88 131* 86 95 90  BUN 10 17 31* 49* 22* 43* 32* 51*  CREATININE 2.94* 5.33* 7.99* 10.63* 5.66* 9.35* 6.91* 9.74*  CALCIUM 9.2 9.6 10.0 9.5 9.1 10.0 10.2 9.7  PHOS 1.9*  --   --   --   --  5.6*  --   --    CBC Recent Labs  Lab 08/07/20 0001 08/08/20 0800 08/09/20 0759 08/10/20 0338  WBC 8.4 8.8 9.4  7.5  HGB 8.7* 8.3* 8.9* 8.2*  HCT 27.4* 26.0* 27.9* 26.3*  MCV 103.8* 105.3* 106.1* 108.2*  PLT 168 142* 153 129*    Medications:    . sodium chloride   Intravenous Once  . sodium chloride   Intravenous Once  . amLODipine  10 mg Oral Daily  . atorvastatin  40 mg Oral QHS  . carvedilol  25 mg Oral BID WC  . Chlorhexidine Gluconate Cloth  6 each Topical Q0600  . darbepoetin (ARANESP) injection - DIALYSIS  100 mcg Intravenous Q Mon-HD  . hydrALAZINE  50 mg Oral Q8H  . insulin aspart  0-6 Units Subcutaneous TID WC  . isosorbide mononitrate  30 mg Oral  Daily  . lidocaine  1 patch Transdermal Q24H  . predniSONE  5 mg Oral Q breakfast  . sodium chloride flush  3 mL Intravenous Q12H  . sodium chloride flush  3 mL Intravenous Q12H    Rexene Agent, MD  08/10/2020, 11:31 AM

## 2020-08-10 NOTE — Care Management Important Message (Signed)
Important Message  Patient Details  Name: James [name] MRN: [id number] Date of Birth: [birth year]   Medicare Important Message Given:  Yes     Shelda Altes 08/10/2020, 12:04 PM

## 2020-08-10 NOTE — Progress Notes (Signed)
Pharmacy Antibiotic Note  James [name] is a 50 y.o. male with PMD of ESRD on HD, hx of failed kidney transplant, PVD, R BKA and recent left TMA who was admitted on 08/01/2020 with acute encephalopathy and respiratory failure. Pt was febrile and pharmacy was been consulted for vancomycin dosing for empiric therapy.  MRI on 1/14 of left foot compatible with acute osteomyelitis, ortho has been consulted.   Plan: Vancomycin vancomycin 750 mg IV with each HD (MWF) Cefepime 2g IV with each HD (MWF) Continue abx and follow up with ID on 08/25/2020 Monitor WBC, temp, clinical improvement, vancomycin levels  Height: 5\' 9"  (175.3 cm) Weight: 62 kg (136 lb 11 oz) IBW/kg (Calculated) : 70.7  Temp (24hrs), Avg:98.4 F (36.9 C), Min:97.7 F (36.5 C), Max:98.9 F (37.2 C)  Recent Labs  Lab 08/05/20 0225 08/06/20 0307 08/06/20 0825 08/07/20 0001 08/08/20 0800 08/09/20 0759 08/10/20 0338  WBC 8.8 7.2  --  8.4 8.8 9.4 7.5  CREATININE 7.99*  --  10.63* 5.66* 9.35* 6.91*  --     Estimated Creatinine Clearance: 11.6 mL/min (A) (by C-G formula based on SCr of 6.91 mg/dL (H)).    Allergies  Allergen Reactions  . Tramadol Hcl Shortness Of Breath and Other (See Comments)    Tramadol HCl = Shortness of breath and Tramadol = Rash  . Nsaids Other (See Comments)    Was told by MD to not take these  . Darvon [Propoxyphene] Rash and Other (See Comments)    Broke out on eyelid     Antimicrobials this admission: Ceftriaxone 1/10, 1/12 >>1/17 Cefepime 1/17>> Vancomycin 1/12 >>  Microbiology results: 1/10 COVID, flu A, flu B, HIV screen: negative 1/11 HBsAg: negative 1/12 bld: ngtd 1/14 MRSA PCR: positive  Thank you for allowing pharmacy to be a part of this patient's care.  Nicoletta Dress, PharmD, BCIDP Infectious Disease Pharmacist  Phone: [phone] 08/10/2020 9:56 AM

## 2020-08-10 NOTE — Progress Notes (Signed)
Progress Note  Patient Name: James [name] Date of Encounter: 08/10/2020  [facility] Cardiologist: Mertie Moores, MD   Subjective   Patient notes that he is doing fine post cath.  Since day prior notes no changes.   No chest pain or pressure .  No SOB/DOE and no PND/Orthopnea.  No weight gain or leg swelling.  No palpitations or syncope .  Inpatient Medications    Scheduled Meds:  sodium chloride   Intravenous Once   sodium chloride   Intravenous Once   amLODipine  10 mg Oral Daily   atorvastatin  40 mg Oral QHS   carvedilol  25 mg Oral BID WC   Chlorhexidine Gluconate Cloth  6 each Topical Q0600   darbepoetin (ARANESP) injection - DIALYSIS  100 mcg Intravenous Q Mon-HD   hydrALAZINE  25 mg Oral Q8H   insulin aspart  0-6 Units Subcutaneous TID WC   lidocaine  1 patch Transdermal Q24H   predniSONE  5 mg Oral Q breakfast   sodium chloride flush  3 mL Intravenous Q12H   sodium chloride flush  3 mL Intravenous Q12H   tacrolimus  0.5 mg Oral Daily   Continuous Infusions:  sodium chloride     ceFEPime (MAXIPIME) IV Stopped (08/08/20 1838)   heparin 1,400 Units/hr (08/10/20 0553)   vancomycin     PRN Meds: sodium chloride, acetaminophen **OR** acetaminophen, acetaminophen, ondansetron (ZOFRAN) IV, oxyCODONE-acetaminophen, polyethylene glycol, sodium chloride flush   Vital Signs    Vitals:   08/09/20 2135 08/10/20 0557 08/10/20 0602 08/10/20 0743  BP: 136/81 (!) 157/86 (!) 157/86 (!) 147/84  Pulse: 99   (!) 103  Resp: 14  18 18   Temp: 98.1 F (36.7 C)  97.7 F (36.5 C) 98.3 F (36.8 C)  TempSrc: Oral Oral Oral Oral  SpO2: 97%   96%  Weight:      Height:        Intake/Output Summary (Last 24 hours) at 08/10/2020 0820 Last data filed at 08/09/2020 2137 Gross per 24 hour  Intake 710 ml  Output --  Net 710 ml   Last 3 Weights 08/08/2020 08/08/2020 08/08/2020  Weight (lbs) 136 lb 11 oz 141 lb 5 oz 146 lb 6.2 oz  Weight (kg) 62 kg 64.1 kg 66.4 kg       Telemetry    Sinus rhythm to sinus tach - Personally Reviewed  ECG    No new tracings - Personally Reviewed  Physical Exam   GEN: No acute distress.   Neck: No JVD Cardiac: RRR, no murmurs, rubs, or gallops. Groin site no hematoma Respiratory: Clear to auscultation bilaterally. 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 Neuro:  Nonfocal  Psych: Normal affect   Labs    High Sensitivity Troponin:   Recent Labs  Lab 08/01/20 1020 08/01/20 1555 08/01/20 1828  TROPONINIHS 1,049* 1,925* 1,964*      Chemistry Recent Labs  Lab 08/03/20 1248 08/04/20 0249 08/05/20 0225 08/07/20 0001 08/08/20 0800 08/09/20 0759  NA 136 139   < > 138 136 133*  K 2.9* 4.2   < > 3.4* 3.7 4.0  CL 95* 97*   < > 96* 97* 95*  CO2 27 28   < > 27 23 23   GLUCOSE 87 86   < > 131* 86 95  BUN 10 17   < > 22* 43* 32*  CREATININE 2.94* 5.33*   < > 5.66* 9.35* 6.91*  CALCIUM 9.2 9.6   < > 9.1 10.0 10.2  PROT  --  7.5  --   --   --   --   ALBUMIN 2.5* 2.3*  --   --  2.3*  --   AST  --  38  --   --   --   --   ALT  --  36  --   --   --   --   ALKPHOS  --  182*  --   --   --   --   BILITOT  --  0.7  --   --   --   --   GFRNONAA 26* 13*   < > 12* 6* 9*  ANIONGAP 14 14   < > 15 16* 15   < > = values in this interval not displayed.     Hematology Recent Labs  Lab 08/08/20 0800 08/09/20 0759 08/10/20 0338  WBC 8.8 9.4 7.5  RBC 2.47* 2.63* 2.43*  HGB 8.3* 8.9* 8.2*  HCT 26.0* 27.9* 26.3*  MCV 105.3* 106.1* 108.2*  MCH 33.6 33.8 33.7  MCHC 31.9 31.9 31.2  RDW 17.0* 16.9* 16.7*  PLT 142* 153 129*    BNPNo results for input(s): BNP, PROBNP in the last 168 hours.   DDimer No results for input(s): DDIMER in the last 168 hours.   Radiology    CARDIAC CATHETERIZATION  Result Date: 08/09/2020  Prox RCA lesion is 30% stenosed.  LV end diastolic pressure is low.  There is no aortic valve stenosis.  SUMMARY  Essentially  angiographically normal coronaries with minimal 30% RCA disease.  Codominant system  Despite reduced EF on echocardiogram, normal LVEDP RECOMMENDATION  Return to nursing for ongoing care  Evaluate for nonischemic causes of cardiomyopathy, titrate medications accordingly.  Okay to restart IV heparin 8 hours after sheath removal. Glenetta Hew, MD   Cardiac Studies   Echocardiogram 08/03/20: 1. EF estimate limited as only para sternal images obtained no apical or sub costal images done due to patient pain and lack of cooperation . Left ventricular ejection fraction, by estimation, is 35 to 40%. The left ventricle has moderately decreased function. The left ventricle demonstrates global hypokinesis. The left ventricular internal cavity size was moderately dilated. There is moderate  left ventricular hypertrophy.   2. Left atrial size was moderately dilated.   3. The mitral valve is abnormal. Mild mitral valve regurgitation.  Moderate mitral annular calcification.   4. Calcified non coronary cusp. The aortic valve was not well visualized. Mild to moderate aortic valve sclerosis/calcification is present, without any evidence of aortic stenosis  Patient Profile     50 y.o. 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 Complex PMH as outlined in consult note. Recently in the hospital for ischemic foot s/p L TMA. He presented to the hospital 08/01/2020 after having a fall and being found confused by his aide that arrived, also with some recollection of having had chest pain. He had been confirmed  compliant with last scheduled HD session on Friday. He was hypertensive and tachycardic (sinus) on arrival. He required BiPAP on arrival and quickly improved with NTG paste and BiPAP. CT  angio ruled out dissection. Cardiology asked to see for elevated troponin. Other issues this admission include worsening anemia + FOBT, waxing/waning encephalopathy and fever. 2D Echo demonstrates new LV dysfunction.  Assessment & Plan    1. NSTEMI: patient presented after being found down at home by his HHA. Reportedly had some recent chest pain. He initially required BiPAP on arrival. HsTrop peaked at 1964. EKG without ischemic changes. CTA chest negative for aortic dissection. Echo this admission showed EF 35-40% with global hypokinesis, moderate LV dilation, moderate LVH, moderate LAE, and mild MR. LHC delayed in the setting of acute on chronic anemia with +FOBT requiring transfusion of PRBC. EGD/C-Scope without source of bleeding and cleared for anticoagulation from GI. Hgb stable at 8.3 today. Additionally with c/f possible osteomyelitis, though ortho states MRI is c/w post surgical changes. Risk factors for CAD include HTN, DM type 2, and PVD. Metoprolol transitioned to carvedilol for better BP control - Plan for LHC today - keep NPO - Continue heparin gtt for now - Continue carvedilol - Will start statin in light of NSTEMI, though LDL is only 36.    2. 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 Prior to this, his last echo was in 2017 with EF 55-60%, G1DD, no RWMA. Etiology remains unclear. Possible this is HTN mediated, though cannot exclude ischemia. Thankfully he has room in his blood pressure for management of his CHF, though CKD limits GDMT.  - Will increase carvedilol to 25mg  BID - Can increase hydralazine to 50 mg TID if still have BP issues post HD - Continue volume management per Nephrology with HD   3. HTN: Poorly controlled. Patient denies trouble with hypotension at HD.  - Anticipate  management in the context of #2   4. Acute on chronic anemia: Hgb dropped to 6.7 this admission. He received 1 uPRBC. FOBT positive this admission. He went for EGD/C-Scope which was without evidence of bleeding though did show some gastritis. Cleared to restart anticoagulation. Hgb stabe at 8.9 today - Continue close monitoring of Hgb   5. PVD s/p right BKA and left TMA: more recently underwent TMA 07/20/20. Noted to have increased drainage and c/f infection. Ortho following. MRI results reviewed and felt to be more c/w post-surgical changes than osteomyelitis. ID following as well. Patient is currently on IV antibiotics  - Continue management per primary team, ortho, and ID   6. Hx of splenic infarct: home coumadin held on admission given c/f GI bleed. Now s/p EGD/C-scope which was negative, cleared to restart anticoagulation per GI. He remains on a heparin gtt until clear no further procedures are necessary - Continue heparin gtt - no other cardiac procedures planned   7. ESRD on HD: s/p failed kidney transplant. On HD M/W/F - Continue prednisone and tacrolimus  - Continue HD per nephrology   [facility] will sign off.   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

## 2020-08-10 NOTE — TOC Transition Note (Signed)
Transition of Care [facility]) - CM/SW Discharge Note   Patient Details  Name: James [name] MRN: [id number] Date of Birth: [birth year]  Transition of Care [facility] CM/SW Contact:  Ninfa Meeker, RN Phone Number: 08/10/2020, 4:15 PM  Discharge date: 08/10/20 Discharge to: Home Transport: PTAR scheduled for 5pm Family Notified: discussed with patient                                                  Clinical Narrative:   Case manager spoke with patient concerning discharge plan and transport home. PTAR has been scheduled to get patient at 5pm. He has his house keys with him and will contact his mom to inform her of dc plan. Medical Necessity Form has been completed and Bedside RN requested to print for transport.       Barriers to Discharge: Continued Medical Work up   Patient Goals and CMS Choice Patient states their goals for this hospitalization and ongoing recovery are:: to go home with [facility] services CMS Medicare.gov Compare Post Acute Care list provided to:: Patient Choice offered to / list presented to : Patient  Discharge Placement                       Discharge Plan and Services In-house Referral: NA Discharge Planning Services: CM Consult Post Acute Care Choice: Home Health,Resumption of Svcs/PTA Provider                    HH Arranged: RN,Disease Management,PT,OT HH Agency: [place] Date [place]: 08/05/20 Time [place]: [id number] Representative spoke with at [place]: [place] ([place]) Interventions     Readmission Risk Interventions No flowsheet data found.

## 2020-08-10 NOTE — Progress Notes (Signed)
Subjective:   No events overnight  Mr. Hagadorn states he is doing well. He notes he is ready to be discharged home as long as we feel it is safe. I informed him I spoke with his podiatrist today and discussed his current hospitalization.  I also updated him that nephrology, ID, and IMTS had a group discussion today concerning patient's follow up. He agrees with the plan. He continues to endorse wanting to have home health and not attend SNF facility due to worry that he will get COVID.   Objective:  Vital signs in last 24 hours: Vitals:   08/10/20 1220 08/10/20 1224 08/10/20 1449 08/10/20 1637  BP: (!) 142/82 (!) 142/82 127/88 139/79  Pulse:   (!) 103 (!) 114  Resp:  20 19   Temp: 97.9 F (36.6 C)  98 F (36.7 C)   TempSrc: Oral     SpO2: 98%  99%   Weight: 62.5 kg     Height:       Physical Exam Constitutional:      General: He is not in acute distress.    Appearance: He is ill-appearing.  Cardiovascular:     Rate and Rhythm: Normal rate and regular rhythm.  Pulmonary:     Effort: Pulmonary effort is normal.     Breath sounds: Normal breath sounds. No wheezing, rhonchi or rales.  Abdominal:     General: Abdomen is flat. Bowel sounds are normal. There is no distension.     Palpations: Abdomen is soft.     Tenderness: There is no abdominal tenderness.  Musculoskeletal:     Comments: RLE BKA, LLE TMA, tender over plantar aspect.   Skin:    General: Skin is warm and dry.  Neurological:     Mental Status: He is alert and oriented to person, place, and time.  Psychiatric:        Mood and Affect: Mood normal.        Behavior: Behavior normal.     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  #Leukocytosis #Fever  WBC of 9.4>8.2 this am. Patient remains afebrile. ID recommending 6 week antibiotic course of vanco and cefepime with HD sessions. Patient to follow up with Dr. Tommy Medal in his office on 08/25/20. Appreciate his time and recommendations. - Continue Vancomycin changed to cefepime yesterday. 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 Will continue abx at this time as per above. Spoke with Dr. Gean Quint patient's podiatrist and updated him of the current hospitalization and plan. He agrees to follow up with the patient on outpatient basis. Patient to be discharged and follow up with his podiatrist. Patient prefers home health with [facility] whom he was being followed by prior to his admission. -wound care eval - daily dressing changes, apply xeroform gauze. -follow up with surgeons once discharge for suture removal -PT/OT recommended SNF, patient would prefer home health.  #Anemia Morning Hgb of 8.9>8.2. He has been given a total of 2 units of PRBC during his admission. Uncertain etiology, but suspect post operative anemia. Will continue to monitor. No clear  evidence source of bleeding. Patient to follow up with PCP. -no acute bleed per colonoscopy/endoscopy -daily cbc -transfuse hgb under 7.0 -hold anticoagulants  #ESRD on Dialysis MWF #Hx of Failed Kidney Transplant 2018 #Hyperkalemia Dialysis session today. Patient to be discharged and resume schedule, next session on Saturday. Will trend electrolytes -continue prednisone and tacrolimus in outpatient setting  #Troponemia Elevated troponin's upon admission. Denies recent episodes of chest pain/shortness of breath. Continue to suspect secondary to demand ischemia in setting of uncontrolled HTN. Cleared by ortho for cath -  Cards planning for left heart cath today  #New Systolic HF  EF 51-70%, LV moderately decreased function. Global hypokinesis. Appears euvolemic on exam today.  -left heart cath today  #Sinus Tachycardia #QT Prolongation Hx of sinus tachycardia, resumed home metoprolol 50 mg q6h. HR has improved since restarting.  -continue to monitor on tele -avoid qtc prolonging medications  #Hx of HTN Home medications of amlodipine, lisinopril.  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 Negative GI workup, no source of bleeding found during colonoscopy/endoscopy.  -continue heparin  #Acute Hypoxic Respiratory Failure - Resolved #Chest Pain Saturating well on room air, currently denies shortness of breath. Denies new episodes of chest pain.  -CTA chest negative for aortic dissection -dialysis per nephro  #Acute Encephalopathy - Resolved #Anion Gap Metabolic Acidosis Resolved Encephalopathy has resolved. AG of 15  -continue to monitor mental status -dialysis per nephro.  -daily bmp  Diet: Renal IVF: None,None VTE: SCDs Code: Full PT/OT: Home Health PT  Dispo: Discharge to home with home health, pending further evaluations from cardiology  [place]  Internal Medicine Resident PGY-1 [place]  Pager: [phone]   Please contact the on call pager after 5 pm and on weekends at [phone].

## 2020-08-10 NOTE — Discharge Instructions (Addendum)
Thank you for allowing [place] to care for you James [name]. You had an extensive hospital course and were seen by many sub-specialty doctors. Please make sure you follow up with all of the providers listed below:  Your Primary Care Doctor Dr. Tommy Medal of Infectious Disease Dr. Gean Quint of Podiatry - please call them and schedule an appointment Your Nephrologist  Medication Changes - Please stop taking your lisinopril, metoprolol, prograf, and gabapentin until you follow up with your primary care doctor, nephrologist, and podiatrist.  - We have added the following medications - Lipitor, Coreg, Apresoline, Imdur, and Lidocaine patches for your foot pain

## 2020-08-10 NOTE — Progress Notes (Signed)
[place] for IV heparin Indication: history of splenic infarct  Allergies  Allergen Reactions  . Tramadol Hcl Shortness Of Breath and Other (See Comments)    Tramadol HCl = Shortness of breath and Tramadol = Rash  . Nsaids Other (See Comments)    Was told by MD to not take these  . Darvon [Propoxyphene] Rash and Other (See Comments)    Broke out on eyelid     Patient Measurements: Height: 5\' 9"  (175.3 cm) Weight: 62 kg (136 lb 11 oz) IBW/kg (Calculated) : 70.7 Heparin Dosing Weight: 65.5 kg  Vital Signs: Temp: 98.1 F (36.7 C) (01/18 2135) Temp Source: Oral (01/18 2135) BP: 136/81 (01/18 2135) Pulse Rate: 99 (01/18 2135)  Labs: Recent Labs    08/08/20 0800 08/08/20 2206 08/09/20 0759 08/09/20 1345 08/10/20 0338  HGB 8.3*  --  8.9*  --  8.2*  HCT 26.0*  --  27.9*  --  26.3*  PLT 142*  --  153  --  129*  HEPARINUNFRC <0.10* <0.10*  --  <0.10* 0.17*  CREATININE 9.35*  --  6.91*  --   --     Estimated Creatinine Clearance: 11.6 mL/min (A) (by C-G formula based on SCr of 6.91 mg/dL (H)).   Medical History: Past Medical History:  Diagnosis Date  . Anemia   . Avascular necrosis ([place])   . BPH (benign prostatic hyperplasia)   . Complication of anesthesia    slow to wake up  . ESRD (end stage renal disease) ([place])    failed kidney transplant  . GERD (gastroesophageal reflux disease)   . Glaucoma   . History of blood transfusion   . Hypertension   . Hypertriglyceridemia   . Influenza A   . 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 Peripheral vascular disease ([place])     s/p prior R BKA 07/2019 and recent L TMA 06/2020  . Pneumonia 04/21/2017   several times per pt on 04/22/20  . Prolonged QT interval   . Prostate cancer ([place])    prostate cancer, s/p prostatectomy 04/24/19  . Renal disorder    dialysis m w f  . Renal transplant recipient / ESRD    ESRD due to  HTN > started HD in 2003, got HD in [place] until about 2006-07 when he was discharged from [facility]. He then got HD in [facility] until getting a renal transplant in 2009 at [place], [place].  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    . Smoker    tx wellbutriin  . 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 Tachycardia   . Thrombocytopenia ([place])   . Transplant recipient   . Type 2 diabetes mellitus ([place])    type 2 - pt states no meds, diet controlled as of 04/22/20    Medications:  Medications Prior to Admission  Medication Sig Dispense Refill Last Dose  . acetaminophen (TYLENOL) 500 MG tablet Take 500-1,000 mg by mouth every 6 (six) hours as needed (pain or headaches).    Past Week at Unknown time  . amLODipine (NORVASC) 10 MG tablet Take 1 tablet (10 mg total) by mouth daily. 30 tablet 0 Past Month at Unknown time  . aspirin EC 81 MG tablet Take 81 mg  by mouth daily. Swallow whole.   Past Month at Unknown time  . diphenhydrAMINE (BENADRYL) 25 mg capsule Take 25 mg by mouth every 6 (six) hours as needed for itching.    unk  . warfarin (COUMADIN) 5 MG tablet Take 5-7.5 mg by mouth See admin instructions. Take 1.5 tablets (7.5 mg) by mouth in the evenings on Mondays, Wednesdays, & Fridays. Take 1 tablet (5 mg) by mouth in the evening on Sundays, Tuesdays, Thursdays, & Saturdays.   07/19/2020  . buPROPion (WELLBUTRIN XL) 150 MG 24 hr tablet Take 150 mg by mouth daily.     . Cyanocobalamin (B-12) 500 MCG TABS Take 500 mcg by mouth daily.      Marland Kitchen doxazosin (CARDURA) 2 MG tablet Take 2 mg by mouth at bedtime.     . ferric citrate (AURYXIA) 1 GM 210 MG(Fe) tablet Take 210-420 mg by mouth See admin instructions. Take 2 tablets (420 mg) by mouth up to three times daily with meals and 1 tablet (210 mg) with snacks     . gabapentin (NEURONTIN) 100 MG capsule Take 100 mg by  mouth at bedtime.     . insulin aspart (NOVOLOG FLEXPEN) 100 UNIT/ML FlexPen Inject 10 Units into the skin 3 (three) times daily with meals. (Patient taking differently: Inject 2 Units into the skin 3 (three) times daily as needed for high blood sugar (CBG >180).) 15 mL 11   . Insulin Detemir (LEVEMIR FLEXTOUCH) 100 UNIT/ML Pen Inject 15 Units into the skin at bedtime. (Patient not taking: Reported on 08/01/2020) 15 mL 11 Not Taking at Unknown time  . latanoprost (XALATAN) 0.005 % ophthalmic solution Place 1 drop into both eyes at bedtime.      Marland Kitchen lisinopril (ZESTRIL) 10 MG tablet Take 10 mg by mouth at bedtime.     Marland Kitchen loperamide (IMODIUM) 2 MG capsule Take 2 mg by mouth as needed for diarrhea or loose stools.      . Melatonin 5 MG CAPS Take 5 mg by mouth at bedtime as needed (sleep).     . metoprolol tartrate (LOPRESSOR) 100 MG tablet Take 100 mg by mouth 2 (two) times daily.      Marland Kitchen omeprazole (PRILOSEC) 20 MG capsule Take 20 mg by mouth daily.      Marland Kitchen oxyCODONE (OXY IR/ROXICODONE) 5 MG immediate release tablet Take 5 mg by mouth every 6 (six) hours as needed (pain).     Marland Kitchen oxyCODONE-acetaminophen (PERCOCET) 5-325 MG tablet Take 1 tablet by mouth every 6 (six) hours as needed. 20 tablet 0   . predniSONE (DELTASONE) 5 MG tablet Take 5 mg by mouth daily with breakfast.      . tacrolimus (PROGRAF) 0.5 MG capsule Take 0.5 mg by mouth daily.  (Patient not taking: Reported on 08/02/2020)   Not Taking at Unknown time  . triamcinolone cream (KENALOG) 0.1 % Apply 1 application topically daily. (dermatitis) After bathing     . valGANciclovir (VALCYTE) 450 MG tablet Take 450 mg by mouth every Monday, Wednesday, and Friday with hemodialysis.   5   . zolpidem (AMBIEN) 10 MG tablet Take 10 mg by mouth at bedtime as needed for sleep.        Assessment: 50 year old male admitted for acute hypoxic respiratory failure with PMH of ESRD on hemodialysis, history of failed transplant, history of lymphoma, hypertension, BPH,  prostate cancer, type 2 diabetes, peripheral vascular disease, right BKA and recent left TMA. Pharmacy consulted to start IV heparin for history  of splenic infarct.  Prior to admission warfarin for history of splenic infarct. Home regimen 7.5 mg on MWF and 5 mg rest of week, last dose 07/19/20.  Warfarin held since admission due to FOBT positive and suspected bleed. Workup with colonoscopy and EGD, cleared to resume anticoagulation per GI.   Heparin level is still undetectable <0.1 after rate increased to 900 units/hr. No bleeding or infusion issues noted.   Hgb 8.3 today, platelets wnl.  1/19 AM update:  Heparin level low No issues per RN  Goal of Therapy:  Heparin level 0.3-0.7 units/ml Monitor platelets by anticoagulation protocol: Yes   Plan:  Inc heparin to 1400 units/hr 1300 heparin level  Narda Bonds, PharmD, BCPS Clinical Pharmacist Phone: [phone]

## 2020-08-10 NOTE — Progress Notes (Signed)
PT Cancellation Note  Patient Details Name: James [name] MRN: [id number] DOB: 11-05-1970   Cancelled Treatment:    Reason Eval/Treat Not Completed: Patient at procedure or test/unavailable - going to HD presently, PT to check back.  Stacie Glaze, PT Acute Rehabilitation Services Pager [phone]  Office [phone]    [name] 08/10/2020, 11:49 AM

## 2020-08-10 NOTE — Progress Notes (Signed)
[place] for IV heparin Indication: history of splenic infarct  Allergies  Allergen Reactions  . Tramadol Hcl Shortness Of Breath and Other (See Comments)    Tramadol HCl = Shortness of breath and Tramadol = Rash  . Nsaids Other (See Comments)    Was told by MD to not take these  . Darvon [Propoxyphene] Rash and Other (See Comments)    Broke out on eyelid     Patient Measurements: Height: 5\' 9"  (175.3 cm) Weight: 62.5 kg (137 lb 12.6 oz) IBW/kg (Calculated) : 70.7 Heparin Dosing Weight: 65.5 kg  Vital Signs: Temp: 98 F (36.7 C) (01/19 1449) Temp Source: Oral (01/19 1220) BP: 127/88 (01/19 1449) Pulse Rate: 103 (01/19 1449)  Labs: Recent Labs    08/08/20 0800 08/08/20 2206 08/09/20 0759 08/09/20 1345 08/10/20 0338 08/10/20 0857 08/10/20 1314  HGB 8.3*  --  8.9*  --  8.2*  --   --   HCT 26.0*  --  27.9*  --  26.3*  --   --   PLT 142*  --  153  --  129*  --   --   HEPARINUNFRC <0.10*   < >  --  <0.10* 0.17*  --  <0.10*  CREATININE 9.35*  --  6.91*  --   --  9.74*  --    < > = values in this interval not displayed.    Estimated Creatinine Clearance: 8.3 mL/min (A) (by C-G formula based on SCr of 9.74 mg/dL (H)).   Medical History: Past Medical History:  Diagnosis Date  . Anemia   . Avascular necrosis ([place])   . BPH (benign prostatic hyperplasia)   . Complication of anesthesia    slow to wake up  . ESRD (end stage renal disease) ([place])    failed kidney transplant  . GERD (gastroesophageal reflux disease)   . Glaucoma   . History of blood transfusion   . Hypertension   . Hypertriglyceridemia   . Influenza A   . 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 Peripheral vascular disease ([place])     s/p prior R BKA 07/2019 and recent L TMA 06/2020  . Pneumonia 04/21/2017   several times per pt on 04/22/20  . Prolonged QT interval   . Prostate cancer ([place])     prostate cancer, s/p prostatectomy 04/24/19  . Renal disorder    dialysis m w f  . 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 He then got HD in [facility] until getting a renal transplant in 2009 at [place], [place].  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    . Smoker    tx wellbutriin  . 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 Tachycardia   . Thrombocytopenia ([place])   . Transplant recipient   . Type 2 diabetes mellitus ([place])    type 2 - pt states no meds, diet controlled as of 04/22/20    Medications:  Medications Prior to Admission  Medication Sig Dispense Refill Last Dose  . acetaminophen (TYLENOL) 500 MG tablet Take 500-1,000 mg by mouth every 6 (six) hours as needed (pain  or headaches).    Past Week at Unknown time  . amLODipine (NORVASC) 10 MG tablet Take 1 tablet (10 mg total) by mouth daily. 30 tablet 0 Past Month at Unknown time  . aspirin EC 81 MG tablet Take 81 mg by mouth daily. Swallow whole.   Past Month at Unknown time  . diphenhydrAMINE (BENADRYL) 25 mg capsule Take 25 mg by mouth every 6 (six) hours as needed for itching.    unk  . warfarin (COUMADIN) 5 MG tablet Take 5-7.5 mg by mouth See admin instructions. Take 1.5 tablets (7.5 mg) by mouth in the evenings on Mondays, Wednesdays, & Fridays. Take 1 tablet (5 mg) by mouth in the evening on Sundays, Tuesdays, Thursdays, & Saturdays.   07/19/2020  . buPROPion (WELLBUTRIN XL) 150 MG 24 hr tablet Take 150 mg by mouth daily.     . Cyanocobalamin (B-12) 500 MCG TABS Take 500 mcg by mouth daily.      Marland Kitchen doxazosin (CARDURA) 2 MG tablet Take 2 mg by mouth at bedtime.     . ferric citrate (AURYXIA) 1 GM 210 MG(Fe) tablet Take 210-420 mg by mouth See admin instructions. Take 2 tablets (420 mg) by  mouth up to three times daily with meals and 1 tablet (210 mg) with snacks     . gabapentin (NEURONTIN) 100 MG capsule Take 100 mg by mouth at bedtime.     . insulin aspart (NOVOLOG FLEXPEN) 100 UNIT/ML FlexPen Inject 10 Units into the skin 3 (three) times daily with meals. (Patient taking differently: Inject 2 Units into the skin 3 (three) times daily as needed for high blood sugar (CBG >180).) 15 mL 11   . Insulin Detemir (LEVEMIR FLEXTOUCH) 100 UNIT/ML Pen Inject 15 Units into the skin at bedtime. (Patient not taking: Reported on 08/01/2020) 15 mL 11 Not Taking at Unknown time  . latanoprost (XALATAN) 0.005 % ophthalmic solution Place 1 drop into both eyes at bedtime.      Marland Kitchen lisinopril (ZESTRIL) 10 MG tablet Take 10 mg by mouth at bedtime.     Marland Kitchen loperamide (IMODIUM) 2 MG capsule Take 2 mg by mouth as needed for diarrhea or loose stools.      . Melatonin 5 MG CAPS Take 5 mg by mouth at bedtime as needed (sleep).     . metoprolol tartrate (LOPRESSOR) 100 MG tablet Take 100 mg by mouth 2 (two) times daily.      Marland Kitchen omeprazole (PRILOSEC) 20 MG capsule Take 20 mg by mouth daily.      Marland Kitchen oxyCODONE (OXY IR/ROXICODONE) 5 MG immediate release tablet Take 5 mg by mouth every 6 (six) hours as needed (pain).     Marland Kitchen oxyCODONE-acetaminophen (PERCOCET) 5-325 MG tablet Take 1 tablet by mouth every 6 (six) hours as needed. 20 tablet 0   . predniSONE (DELTASONE) 5 MG tablet Take 5 mg by mouth daily with breakfast.      . tacrolimus (PROGRAF) 0.5 MG capsule Take 0.5 mg by mouth daily.  (Patient not taking: Reported on 08/02/2020)   Not Taking at Unknown time  . triamcinolone cream (KENALOG) 0.1 % Apply 1 application topically daily. (dermatitis) After bathing     . valGANciclovir (VALCYTE) 450 MG tablet Take 450 mg by mouth every Monday, Wednesday, and Friday with hemodialysis.   5   . zolpidem (AMBIEN) 10 MG tablet Take 10 mg by mouth at bedtime as needed for sleep.        Assessment: 50 year old male  admitted for  acute hypoxic respiratory failure with PMH of ESRD on hemodialysis, history of failed transplant, history of lymphoma, hypertension, BPH, prostate cancer, type 2 diabetes, peripheral vascular disease, right BKA and recent left TMA. Pharmacy consulted to start IV heparin for history of splenic infarct.  Prior to admission warfarin for history of splenic infarct. Home regimen 7.5 mg on MWF and 5 mg rest of week, last dose 07/19/20.  Warfarin held since admission due to FOBT positive and suspected bleed. Workup with colonoscopy and EGD, cleared to resume anticoagulation per GI.   Heparin level undetectable prior to loss of IV access. Discussed with team, okay to keep off heparin infusion, start warfarin, no need for bridge. Hgb 8.2, plt 129. No s/sx of bleeding.   INR on last check was 1.2 on 1/15 - no warfarin since.   PTA warfarin regimen is 7.5 mg MWF, 5 mg all other days.  Goal of Therapy:  INR 2-3 Monitor platelets by anticoagulation protocol: Yes   Plan:  Stop heparin infusion Will order warfarin 7.5 mg tonight Monitor daily INR, CBC, and for s/sx of bleeding   Antonietta Jewel, PharmD, [place] Pharmacist  Phone: [phone] 08/10/2020 3:28 PM  Please check AMION for all [place] phone numbers After 10:00 PM, call [place] [phone]

## 2020-08-10 NOTE — Progress Notes (Signed)
OT Cancellation Note  Patient Details Name: ILIR MAHRT MRN: [id number] DOB: Jul 13, 1971   Cancelled Treatment:    Reason Eval/Treat Not Completed: Patient at procedure or test/ unavailable;Other (comment)pt at HD, will check back as time allows.  Corinne Ports K., COTA/L Acute Rehabilitation Services [phone]   Precious Haws 08/10/2020, 2:19 PM

## 2020-08-10 NOTE — Progress Notes (Signed)
Cc: Left foot hurts   Antibiotics:  Anti-infectives (From admission, onward)   Start     Dose/Rate Route Frequency Ordered Stop   08/08/20 1800  ceFEPIme (MAXIPIME) 2 g in sodium chloride 0.9 % 100 mL IVPB        2 g 200 mL/hr over 30 Minutes Intravenous Every M-W-F (1800) 08/08/20 0959     08/08/20 1200  vancomycin (VANCOREADY) IVPB 750 mg/150 mL        750 mg 150 mL/hr over 60 Minutes Intravenous Every M-W-F (Hemodialysis) 08/05/20 1300     08/08/20 0953  Vancomycin (VANCOCIN) 750-5 MG/150ML-% IVPB       Note to Pharmacy: Ashley Akin   : cabinet override      08/08/20 0953 08/08/20 1021   08/06/20 1200  vancomycin (VANCOCIN) IVPB 750 mg/150 ml premix        750 mg 150 mL/hr over 60 Minutes Intravenous Every T-Th-Sa (Hemodialysis) 08/05/20 1254 08/06/20 1628   08/05/20 1200  vancomycin (VANCOREADY) IVPB 750 mg/150 mL  Status:  Discontinued        750 mg 150 mL/hr over 60 Minutes Intravenous Every M-W-F (Hemodialysis) 08/03/20 1630 08/05/20 1259   08/03/20 1700  cefTRIAXone (ROCEPHIN) 2 g in sodium chloride 0.9 % 100 mL IVPB  Status:  Discontinued        2 g 200 mL/hr over 30 Minutes Intravenous Every 24 hours 08/03/20 1603 08/08/20 0959   08/03/20 1700  vancomycin (VANCOREADY) IVPB 1250 mg/250 mL        1,250 mg 166.7 mL/hr over 90 Minutes Intravenous  Once 08/03/20 1630 08/03/20 2033   08/01/20 1330  cefTRIAXone (ROCEPHIN) 1 g in sodium chloride 0.9 % 100 mL IVPB        1 g 200 mL/hr over 30 Minutes Intravenous  Once 08/01/20 1316 08/01/20 1630   08/01/20 1330  azithromycin (ZITHROMAX) 500 mg in sodium chloride 0.9 % 250 mL IVPB  Status:  Discontinued        500 mg 250 mL/hr over 60 Minutes Intravenous  Once 08/01/20 1316 08/01/20 1521      Medications: Scheduled Meds: . sodium chloride   Intravenous Once  . sodium chloride   Intravenous Once  . amLODipine  10 mg Oral Daily  . atorvastatin  40 mg Oral QHS  . carvedilol  25 mg Oral BID WC  . Chlorhexidine  Gluconate Cloth  6 each Topical Q0600  . darbepoetin (ARANESP) injection - DIALYSIS  100 mcg Intravenous Q Mon-HD  . hydrALAZINE  50 mg Oral Q8H  . insulin aspart  0-6 Units Subcutaneous TID WC  . isosorbide mononitrate  30 mg Oral Daily  . lidocaine  1 patch Transdermal Q24H  . predniSONE  5 mg Oral Q breakfast  . sodium chloride flush  3 mL Intravenous Q12H  . sodium chloride flush  3 mL Intravenous Q12H  . tacrolimus  0.5 mg Oral Daily   Continuous Infusions: . sodium chloride    . ceFEPime (MAXIPIME) IV Stopped (08/08/20 1838)  . heparin 1,400 Units/hr (08/10/20 0553)  . vancomycin     PRN Meds:.sodium chloride, acetaminophen **OR** acetaminophen, acetaminophen, ondansetron (ZOFRAN) IV, oxyCODONE-acetaminophen, polyethylene glycol, sodium chloride flush    Objective: Weight change:   Intake/Output Summary (Last 24 hours) at 08/10/2020 0955 Last data filed at 08/09/2020 2137 Gross per 24 hour  Intake 710 ml  Output --  Net 710 ml   Blood pressure (!) 147/84, pulse (!) 103, temperature  98.3 F (36.8 C), temperature source Oral, resp. rate 18, height 5\' 9"  (1.753 m), weight 62 kg, SpO2 96 %. Temp:  [97.7 F (36.5 C)-98.9 F (37.2 C)] 98.3 F (36.8 C) (01/19 0743) Pulse Rate:  [0-192] 103 (01/19 0743) Resp:  [0-32] 18 (01/19 0743) BP: (133-159)/(59-95) 147/84 (01/19 0743) SpO2:  [0 %-100 %] 96 % (01/19 0743)  Physical Exam: Physical Exam Constitutional:      Appearance: He is well-developed.  HENT:     Head: Normocephalic and atraumatic.  Eyes:     Conjunctiva/sclera: Conjunctivae normal.  Cardiovascular:     Rate and Rhythm: Normal rate and regular rhythm.  Pulmonary:     Effort: Pulmonary effort is normal. No respiratory distress.     Breath sounds: No wheezing.  Abdominal:     General: There is no distension.     Palpations: Abdomen is soft.  Musculoskeletal:     Cervical back: Normal range of motion and neck supple.  Skin:    General: Skin is warm and  dry.     Findings: No erythema or [name].  Neurological:     General: No focal deficit present.     Mental Status: He is alert and oriented to person, place, and time.  Psychiatric:        Attention and Perception: Attention normal.        Mood and Affect: Mood is anxious.        Speech: Speech normal.        Behavior: Behavior normal.        Thought Content: Thought content normal.        Cognition and Memory: Cognition and memory normal.        Judgment: Judgment normal.     Left foot 08/08/2020:    Wound dressed today  CBC:    BMET Recent Labs    08/08/20 0800 08/09/20 0759  NA 136 133*  K 3.7 4.0  CL 97* 95*  CO2 23 23  GLUCOSE 86 95  BUN 43* 32*  CREATININE 9.35* 6.91*  CALCIUM 10.0 10.2     Liver Panel  Recent Labs    08/08/20 0800  ALBUMIN 2.3*       Sedimentation Rate Recent Labs    08/08/20 0800  ESRSEDRATE >140*   C-Reactive Protein No results for input(s): CRP in the last 72 hours.  Micro Results: Recent Results (from the past 720 hour(s))  Resp Panel by RT-PCR (Flu A&B, Covid) Nasopharyngeal Swab     Status: None   Collection Time: 08/01/20 12:29 PM   Specimen: Nasopharyngeal Swab; Nasopharyngeal(NP) swabs in vial transport medium  Result Value Ref Range Status   SARS Coronavirus 2 by RT PCR NEGATIVE NEGATIVE Final    Comment: (NOTE) SARS-CoV-2 target nucleic acids are NOT DETECTED.  The SARS-CoV-2 RNA is generally detectable in upper respiratory specimens during the acute phase of infection. The lowest concentration of SARS-CoV-2 viral copies this assay can detect is 138 copies/mL. A negative result does not preclude SARS-Cov-2 infection and should not be used as the sole basis for treatment or other patient management decisions. A negative result may occur with  improper specimen collection/handling, submission of specimen other than nasopharyngeal swab, presence of viral mutation(s) within the areas targeted by this assay, and  inadequate number of viral copies(<138 copies/mL). A negative result must be combined with clinical observations, patient history, and epidemiological information. The expected result is Negative.  Fact Sheet for Patients:  EntrepreneurPulse.com.au  Fact Sheet  for Healthcare Providers:  IncredibleEmployment.be  This test is no t yet approved or cleared by the [place] and  has been authorized for detection and/or diagnosis of SARS-CoV-2 by FDA under an Emergency Use Authorization (EUA). This EUA will remain  in effect (meaning this test can be used) for the duration of the COVID-19 declaration under Section 564(b)(1) of the Act, 21 U.S.C.section 360bbb-3(b)(1), unless the authorization is terminated  or revoked sooner.       Influenza A by PCR NEGATIVE NEGATIVE Final   Influenza B by PCR NEGATIVE NEGATIVE Final    Comment: (NOTE) The Xpert Xpress SARS-CoV-2/FLU/RSV plus assay is intended as an aid in the diagnosis of influenza from Nasopharyngeal swab specimens and should not be used as a sole basis for treatment. Nasal washings and aspirates are unacceptable for Xpert Xpress SARS-CoV-2/FLU/RSV testing.  Fact Sheet for Patients: EntrepreneurPulse.com.au  Fact Sheet for Healthcare Providers: IncredibleEmployment.be  This test is not yet approved or cleared by the [place] FDA and has been authorized for detection and/or diagnosis of SARS-CoV-2 by FDA under an Emergency Use Authorization (EUA). This EUA will remain in effect (meaning this test can be used) for the duration of the COVID-19 declaration under Section 564(b)(1) of the Act, 21 U.S.C. section 360bbb-3(b)(1), unless the authorization is terminated or revoked.  Performed at [facility], [place] [address], [place], [place] [address]   Culture, blood (routine x 2)     Status: None   Collection Time: 08/03/20  4:14 PM    Specimen: BLOOD  Result Value Ref Range Status   Specimen Description BLOOD LEFT ANTECUBITAL  Final   Special Requests   Final    BOTTLES DRAWN AEROBIC AND ANAEROBIC Blood Culture adequate volume   Culture   Final    NO GROWTH 5 DAYS Performed at [facility], [place] [address], [place], [place] [address]    Report Status 08/08/2020 FINAL  Final  Culture, blood (routine x 2)     Status: None   Collection Time: 08/03/20  4:14 PM   Specimen: BLOOD LEFT HAND  Result Value Ref Range Status   Specimen Description BLOOD LEFT HAND  Final   Special Requests   Final    BOTTLES DRAWN AEROBIC AND ANAEROBIC Blood Culture adequate volume   Culture   Final    NO GROWTH 5 DAYS Performed at [facility], [place] [address] [place], [place] [address]    Report Status 08/08/2020 FINAL  Final  MRSA PCR Screening     Status: Abnormal   Collection Time: 08/05/20  6:09 AM   Specimen: Nasopharyngeal  Result Value Ref Range Status   MRSA by PCR POSITIVE (A) NEGATIVE Final    Comment:        The GeneXpert MRSA Assay (FDA approved for NASAL specimens only), is one component of a comprehensive MRSA colonization surveillance program. It is not intended to diagnose MRSA infection nor to guide or monitor treatment for MRSA infections. RESULT CALLED TO, READ BACK BY AND VERIFIED WITH: Aggie Cosier RN 10:00 08/05/20 ([email or web address]) Performed at [facility], [place] [address] [place], [place] [address]     Studies/Results: CARDIAC CATHETERIZATION  Result Date: 08/09/2020  Prox RCA lesion is 30% stenosed.  LV end diastolic pressure is low.  There is no aortic valve stenosis.  SUMMARY  Essentially angiographically normal coronaries with minimal 30% RCA disease.  Codominant system  Despite reduced EF on echocardiogram, normal LVEDP RECOMMENDATION  Return to nursing for ongoing care  Evaluate for nonischemic causes of cardiomyopathy, titrate medications accordingly.  Okay to restart IV heparin 8  hours after sheath removal. Glenetta Hew, MD     Assessment/Plan:  INTERVAL HISTORY:  Primary team d/w his surgeon ? Surgical intervention   Active Problems:   Tachycardia   End stage renal disease (HCC)   Elevated troponin   Anemia   Renal transplant recipient   Respiratory failure (HCC)   HFrEF (heart failure with reduced ejection fraction) (HCC)   Non-ST elevation (NSTEMI) myocardial infarction [facility])   Acute pulmonary edema (HCC)   Hypertensive urgency    James [name] is a 50 y.o. 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  Pathology at [facility] showed necrotic bone in the areas that were amputated.  Not clear to me if the margins were clean or not.  He was admitted with encephalopathy and concern for infection at the operative site.  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   I cannot see that Orthopedic surgeon co-signed the Consult note here at [facility]  It does NOT sound like any interventions are going to happen soon and I would bet transfer will not be trivial AND pt is anxious to go home  I am going to recommend 6 weeks of IV vanco and cefepime with HD  I have scheduled him with me in clinic   James [name] has an appointment on 08/25/2020 at 32 AM with Dr. Tommy Medal   The [facility] is located in the [facility] at  [place] in [place].  [address], which is located to the left of the elevators.  Phone: [phone]  Fax: [phone]  [email or web address]  I will otherwise sign off for now  Please call with further  questions.    LOS: 9 days   Alcide Evener 08/10/2020, 9:55 AM

## 2020-08-16 NOTE — Discharge Summary (Signed)
Name: James [name] MRN: [id number] DOB: 27-Sep-1970 50 y.o. PCP: [name], MD  Date of Admission: 08/01/2020 10:02 AM Date of Discharge: 08/10/2020 Attending Physician: Dr. Angelia Mould  Discharge Diagnosis: Active Problems:   Tachycardia   End stage renal disease (HCC)   Elevated troponin   Anemia   Renal transplant recipient   Respiratory failure ([place])   HFrEF (heart failure with reduced ejection fraction) (HCC)   Non-ST elevation (NSTEMI) myocardial infarction [facility])   Acute pulmonary edema (HCC)   Hypertensive urgency    Discharge Medications: Allergies as of 08/10/2020      Reactions   Tramadol Hcl Shortness Of Breath, Other (See Comments)   Tramadol HCl = Shortness of breath and Tramadol = Rash   Nsaids Other (See Comments)   Was told by MD to not take these   Darvon [propoxyphene] Rash, Other (See Comments)   Broke out on eyelid       Medication List    STOP taking these medications   gabapentin 100 MG capsule Commonly known as: NEURONTIN   lisinopril 10 MG tablet Commonly known as: ZESTRIL   metoprolol tartrate 100 MG tablet Commonly known as: LOPRESSOR   oxyCODONE-acetaminophen 5-325 MG tablet Commonly known as: Percocet   tacrolimus 0.5 MG capsule Commonly known as: PROGRAF     TAKE these medications   acetaminophen 500 MG tablet Commonly known as: TYLENOL Take 500-1,000 mg by mouth every 6 (six) hours as needed (pain or headaches).   amLODipine 10 MG tablet Commonly known as: NORVASC Take 1 tablet (10 mg total) by mouth daily.   aspirin EC 81 MG tablet Take 81 mg by mouth daily. Swallow whole.   atorvastatin 40 MG tablet Commonly known as: LIPITOR Take 1 tablet (40 mg total) by mouth at bedtime.   B-12 500 MCG Tabs Take 500 mcg by mouth daily.   buPROPion 150 MG 24 hr tablet Commonly known as: WELLBUTRIN XL Take 150 mg by mouth daily.   carvedilol 25 MG tablet Commonly known as: COREG Take 1 tablet (25 mg total) by mouth 2  (two) times daily with a meal.   ceFEPIme 2 g in sodium chloride 0.9 % 100 mL Inject 2 g into the vein every Monday, Wednesday, and Friday at 6 PM.   Darbepoetin Alfa 100 MCG/0.5ML Sosy injection Commonly known as: ARANESP Inject 0.5 mLs (100 mcg total) into the vein every Monday with hemodialysis.   diphenhydrAMINE 25 mg capsule Commonly known as: BENADRYL Take 25 mg by mouth every 6 (six) hours as needed for itching.   doxazosin 2 MG tablet Commonly known as: CARDURA Take 2 mg by mouth at bedtime.   ferric citrate 1 GM 210 MG(Fe) tablet Commonly known as: AURYXIA Take 210-420 mg by mouth See admin instructions. Take 2 tablets (420 mg) by mouth up to three times daily with meals and 1 tablet (210 mg) with snacks   hydrALAZINE 50 MG tablet Commonly known as: APRESOLINE Take 1 tablet (50 mg total) by mouth every 8 (eight) hours.   insulin aspart 100 UNIT/ML FlexPen Commonly known as: NovoLOG FlexPen Inject 10 Units into the skin 3 (three) times daily with meals. What changed:   how much to take  when to take this  reasons to take this   insulin detemir 100 UNIT/ML FlexPen Commonly known as: Levemir FlexTouch Inject 15 Units into the skin at bedtime.   isosorbide mononitrate 30 MG 24 hr tablet Commonly known as: IMDUR Take 1 tablet (30  mg total) by mouth daily.   latanoprost 0.005 % ophthalmic solution Commonly known as: XALATAN Place 1 drop into both eyes at bedtime.   lidocaine 5 % Commonly known as: LIDODERM Place 1 patch onto the skin daily. Remove & Discard patch within 12 hours or as directed by MD   loperamide 2 MG capsule Commonly known as: IMODIUM Take 2 mg by mouth as needed for diarrhea or loose stools.   Melatonin 5 MG Caps Take 5 mg by mouth at bedtime as needed (sleep).   omeprazole 20 MG capsule Commonly known as: PRILOSEC Take 20 mg by mouth daily.   oxyCODONE 5 MG immediate release tablet Commonly known as: Oxy IR/ROXICODONE Take 5 mg by  mouth every 6 (six) hours as needed (pain).   predniSONE 5 MG tablet Commonly known as: DELTASONE Take 5 mg by mouth daily with breakfast.   triamcinolone 0.1 % Commonly known as: KENALOG Apply 1 application topically daily. (dermatitis) After bathing   valGANciclovir 450 MG tablet Commonly known as: VALCYTE Take 450 mg by mouth every Monday, Wednesday, and Friday with hemodialysis.   vancomycin 750 MG/150ML Soln Commonly known as: VANCOREADY Inject 150 mLs (750 mg total) into the vein every Monday, Wednesday, and Friday with hemodialysis.   warfarin 5 MG tablet Commonly known as: COUMADIN Take 5-7.5 mg by mouth See admin instructions. Take 1.5 tablets (7.5 mg) by mouth in the evenings on Mondays, Wednesdays, & Fridays. Take 1 tablet (5 mg) by mouth in the evening on Sundays, Tuesdays, Thursdays, & Saturdays.   zolpidem 10 MG tablet Commonly known as: AMBIEN Take 10 mg by mouth at bedtime as needed for sleep.            Discharge Care Instructions  (From admission, onward)         Start     Ordered   08/10/20 0000  Leave dressing on - Keep it clean, dry, and intact until clinic visit        01 /19/22 1532          Disposition and follow-up:   James [name] was discharged from [facility] in Stable condition.  At the hospital follow up visit please address:  1.  Follow-up:  A. Left TMA    B. Anemia   C. ESRD  2.  Labs / imaging needed at time of follow-up: CBC  3.  Pending labs/ test needing follow-up: None  4.  Medication Changes  Started: Lipitor 40 mg, Coreg 25 mg, Hydralazine 50 mg, Imdur 30 mg  Stopped: Tacrolimus, Gabapentin, Lopressor, Lisinopril  Abx - Vanc, Cefepime  End Date: 09/17/20  Follow-up Appointments:  Follow-up Information    [facility] Follow up.   Specialty: Home Health Services Why: Registered Nurse, Physical Therapy, Occupational Therapy-office to call with visit times.  Contact  information: [address] [place] [address] [phone]        [name], MD Follow up.   Specialty: Family Medicine Contact information: [place] [address] [phone]        Nahser, Wonda Cheng, MD .   Specialty: Cardiology Contact information: [place] [address] [phone]        Tommy Medal, Lavell Islam, MD Follow up on 08/25/2020.   Specialty: Infectious Diseases Why:  appointment on 08/25/2020 at 73 AM with Dr. Tommy Medal   The [facility] is located in the [facility] at  [place] in [place].  [address], which is located to the left of the elevators. Contact information: [address] [place] [address] [phone]        Sharyn Dross., DPM. Schedule an appointment as soon as possible for a visit.   Specialty: Podiatry Contact information: [address] [place] [address] [place] by problem list:  Left Transmetatarsal Amputation Patient presented s/p left transmetatarsal transection 06/2020 secondary to PVD at [facility]. He refused SNF and wanted home health. He endorsed pain in the area. CBC in ED revealed leukocytosis, patient received one dose of ceftriaxone and the leukocytosis resolved. A few days into his admission the patient became febrile with leukocytosis. With persistent pain of the left TMA region with some serosanguinous drainage, he was started on vancomycin and ceftriaxone. An MRI was obtained of the left foot revealing marrow signal changes questionable osteomyelitis vs post op changes. Also had 4.0 cm area suggestive of phlegmon/developing abscess vs post op changes. Evaluated by orthopedics who believed the acute changes were consistent with a post operative course. BC negative. ID consulted who recommended 6 weeks IV vanco and cefepime,  end date of 09/17/20. Patient instructed to follow up with podiatrist and Dr. Tommy Medal of ID.   Acute Encephalopathy Suspected Metabolic Etiology Patient initially only oriented to person and place, not time. Unable to answer questions pertaining to what brought him into the hospital and few days prior to his admission. His long term memory was intact. Difficult to assess etiology, possible causes were uremic encephalopathy, medication induced secondary to pain medications prescribed for his recent TMA, and possibly secondary to infection. CT Head w/o C without acute process. As patient had multiple HD sessions, his mental status improved. At his baseline at discharge.   Acute Hypoxic Respiratory Failure  Presented to the ED with shortness of breath requiring BiPAP. Shortness of breath improved and chest pain resolved with BiPAP and nitro paste.Stable oxygen saturations on this. X-ray revealed pulmonary edema. Trending troponin 1000 >1900. Patient without significant EKG changes. Unclear etiology but thought to be due to systemic symptomatic hypertension vs volume overloaded in setting of ESRD. Patient with full cardiac workup including catheterization without significant CAD. Hypoxia improved with dialysis sessions and improvement of hypertension.   NSTMEI Troponemia Acute CHF Patient presented with hypoxia and chest pain, improved with nitro paste. Troponins over 1000, however, without EKG changes. Thought to be due to demand ischemia in setting of severe hypertension and in setting of ESRD, leading to higher levels of troponin. Full workup by cardiology, echocardiogram with changes, EF 35-40%. Prior echo with EF of 55-60%. Echo changes thought to be HTN mediated. Cardiac catheterization performed, no PCI needed. Started on imdur,coreg at discharge.  Sinus Tachycardia Patient presented with tachycardia, appeared to have narrow complex tachycardia, with rate as high as 153 bpm. This was thought to be  secondary to patient not taking his metoprolol PTA secondary to his altered mental status. Once patient restarted on metoprolol, improved. At discharged, transitioned to coreg.   Acute on Chronic Anemia Hgb dropped to 6.7 on admission. Patient given 1 u PRBC. FOBT was positive. EGD/C-scope without evidence of acute bleeding, however, did have some evidence of gastritis.     Discharge Vitals:   BP (!) 154/69 (BP Location: Left Arm)   Pulse (!) 103   Temp 98.3 F (  36.8 C) (Oral)   Resp 18   Ht 5\' 9"  (1.753 m)   Wt 62.8 kg   SpO2 98%   BMI 20.45 kg/m   Pertinent Labs, Studies, and Procedures:  CBC Latest Ref Rng & Units 08/10/2020 08/09/2020 08/08/2020  WBC 4.0 - 10.5 K/uL 7.5 9.4 8.8  Hemoglobin 13.0 - 17.0 g/dL 8.2(L) 8.9(L) 8.3(L)  Hematocrit 39.0 - 52.0 % 26.3(L) 27.9(L) 26.0(L)  Platelets 150 - 400 K/uL 129(L) 153 142(L)    CMP Latest Ref Rng & Units 08/10/2020 08/09/2020 08/08/2020  Glucose 70 - 99 mg/dL 90 95 86  BUN 6 - 20 mg/dL 51(H) 32(H) 43(H)  Creatinine 0.61 - 1.24 mg/dL 9.74(H) 6.91(H) 9.35(H)  Sodium 135 - 145 mmol/L 134(L) 133(L) 136  Potassium 3.5 - 5.1 mmol/L 4.3 4.0 3.7  Chloride 98 - 111 mmol/L 97(L) 95(L) 97(L)  CO2 22 - 32 mmol/L 21(L) 23 23  Calcium 8.9 - 10.3 mg/dL 9.7 10.2 10.0  Total Protein 6.5 - 8.1 g/dL - - -  Total Bilirubin 0.3 - 1.2 mg/dL - - -  Alkaline Phos 38 - 126 U/L - - -  AST 15 - 41 U/L - - -  ALT 0 - 44 U/L - - -    CT HEAD WO CONTRAST  Result Date: 08/02/2020 CLINICAL DATA:  Mental status changes of unknown cause, acute encephalopathy suspect metabolic, recent splenic infarct; history end-stage renal disease, hypertension, B-cell lymphoproliferative disorder, type II diabetes mellitus, smoker EXAM: CT HEAD WITHOUT CONTRAST TECHNIQUE: Contiguous axial images were obtained from the base of the skull through the vertex without intravenous contrast. Sagittal and coronal MPR images reconstructed from axial data set. COMPARISON:  None FINDINGS:  Brain: Normal ventricular morphology. No midline shift or mass effect. Normal appearance of brain parenchyma. No intracranial hemorrhage, mass lesion, or evidence of acute infarction. No extra-axial fluid collections. Vascular: No hyperdense vessels. Atherosclerotic calcification of internal carotid and vertebral arteries at skull base. Skull: Intact Sinuses/Orbits: Clear Other: N/A IMPRESSION: No acute intracranial abnormalities. Electronically Signed   By: Lavonia Dana M.D.   On: 08/02/2020 16:32   DG Chest Portable 1 View  Result Date: 08/01/2020 CLINICAL DATA:  Shortness of breath EXAM: PORTABLE CHEST 1 VIEW COMPARISON:  11/02/2019 FINDINGS: Cardiomegaly. Atherosclerotic calcification of the aortic knob. Pulmonary vascular congestion with diffuse bilateral interstitial opacities. More confluent airspace disease within the lung bases, right worse than left. Possible trace right pleural effusion. No pneumothorax. IMPRESSION: Cardiomegaly with bilateral interstitial opacities, likely pulmonary edema. A superimposed infectious process would be difficult to exclude, particularly at the right lung base. Electronically Signed   By: Davina Poke D.O.   On: 08/01/2020 10:50   CT Angio Chest/Abd/Pel for Dissection W and/or W/WO  Result Date: 08/02/2020 CLINICAL DATA:  Chest pain and back pain. Concern for aortic dissection. Pain status post fall 2 days ago with left toe amputation 1 week ago. EXAM: CT ANGIOGRAPHY CHEST, ABDOMEN AND PELVIS TECHNIQUE: Non-contrast CT of the chest was initially obtained. Multidetector CT imaging through the chest, abdomen and pelvis was performed using the standard protocol during bolus administration of intravenous contrast. Multiplanar reconstructed images and MIPs were obtained and reviewed to evaluate the vascular anatomy. CONTRAST:  180mL OMNIPAQUE IOHEXOL 350 MG/ML SOLN COMPARISON:  CT of the pelvis dated 10/28/2019. FINDINGS: CTA CHEST FINDINGS Cardiovascular: There is no  evidence for a thoracic aortic dissection or aneurysm. There are atherosclerotic changes of the thoracic aorta. There is no evidence for an acute pulmonary embolism. The  heart size is moderately enlarged. There is a trace pericardial effusion. The arch vessels are patent where visualized. Mediastinum/Nodes: --there is mild mediastinal adenopathy, likely reactive. -- No hilar lymphadenopathy. -- No axillary lymphadenopathy. -- No supraclavicular lymphadenopathy. -- Normal thyroid gland where visualized. -  Unremarkable esophagus. Lungs/Pleura: There are small bilateral pleural effusions with adjacent atelectasis. There is diffuse bilateral interlobular septal thickening with hazy bilateral ground-glass airspace opacification. There is no definite pneumothorax. Musculoskeletal: No chest wall abnormality. No bony spinal canal stenosis. Review of the MIP images confirms the above findings. CTA ABDOMEN AND PELVIS FINDINGS VASCULAR Aorta: Normal caliber aorta without aneurysm, dissection, vasculitis or significant stenosis. Atherosclerotic changes are noted. Celiac: Patent without evidence of aneurysm, dissection, vasculitis or significant stenosis. SMA: Patent without evidence of aneurysm, dissection, vasculitis or significant stenosis. Renals: Both renal arteries are highly stenotic. IMA: Patent without evidence of aneurysm, dissection, vasculitis or significant stenosis. Inflow: Patent without evidence of aneurysm, dissection, vasculitis or significant stenosis. Veins: No obvious venous abnormality within the limitations of this arterial phase study. Review of the MIP images confirms the above findings. NON-VASCULAR Hepatobiliary: Small hepatic cysts are again noted. Status post cholecystectomy.There is no biliary ductal dilation. Pancreas: Normal contours without ductal dilatation. No peripancreatic fluid collection. Spleen: The spleen is borderline enlarged. Adrenals/Urinary Tract: --Adrenal glands: Unremarkable.  --Right kidney/ureter: The right kidney is atrophic. --Left kidney/ureter: The left kidney is atrophic. There is a growing exophytic nodule rising from the lower pole measuring approximately 25 Hounsfield units and 2.1 cm. The left kidney is atrophic. There is a right pelvic transplant kidney in place with evidence for cortical thinning. There is no hydronephrosis. --Urinary bladder: Unremarkable. Stomach/Bowel: --Stomach/Duodenum: No hiatal hernia or other gastric abnormality. Normal duodenal course and caliber. --Small bowel: Unremarkable. --Colon: Unremarkable. --Appendix: Normal. Lymphatic: --No retroperitoneal lymphadenopathy. --No mesenteric lymphadenopathy. --there are few mildly enlarged pelvic lymph nodes which are presumably reactive in etiology. Reproductive: Not well evaluated secondary to streak artifact. Other: No ascites or free air. The abdominal wall is normal. Musculoskeletal. The patient is status post total hip arthroplasty on the right. Review of the MIP images confirms the above findings. IMPRESSION: 1. No acute vascular abnormality. 2. Cardiomegaly with small bilateral pleural effusions and findings consistent with developing pulmonary edema. An underlying atypical infectious process is not entirely excluded. 3. Indeterminate growing exophytic nodule rising from the lower pole the left kidney. This is favored to represent a proteinaceous or hemorrhagic cyst, however follow-up with an outpatient renal ultrasound is recommended. 4. Atrophic native kidneys. The right pelvic transplant kidney demonstrates cortical thinning without hydronephrosis. Findings could indicate transplant failure. 5. Additional chronic findings as detailed above. Aortic Atherosclerosis (ICD10-I70.0). Electronically Signed   By: Constance Holster M.D.   On: 08/02/2020 01:01     Discharge Instructions: Discharge Instructions    Call MD for:  difficulty breathing, headache or visual disturbances   Complete by: As  directed    Call MD for:  persistant dizziness or light-headedness   Complete by: As directed    Call MD for:  persistant nausea and vomiting   Complete by: As directed    Call MD for:  redness, tenderness, or signs of infection (pain, swelling, redness, odor or green/yellow discharge around incision site)   Complete by: As directed    Call MD for:  severe uncontrolled pain   Complete by: As directed    Call MD for:  temperature >100.4   Complete by: As directed    Diet - low  sodium heart healthy   Complete by: As directed    Increase activity slowly   Complete by: As directed    Leave dressing on - Keep it clean, dry, and intact until clinic visit   Complete by: As directed       Signed: Riesa Pope, MD 08/16/2020, 6:28 PM   Pager: [phone]

## 2020-08-22 ENCOUNTER — Telehealth: Payer: Self-pay | Admitting: *Deleted

## 2020-08-22 NOTE — Telephone Encounter (Signed)
-----   Message from Truman Hayward, MD sent at 08/22/2020  1:31 PM EST ----- Thanks so much and we can cancel his appt with [place] ----- Message ----- From: Riesa Pope, MD Sent: 08/21/2020   9:58 AM EST To: Truman Hayward, MD, Lucious Groves, DO  Good Morning Dr. Tommy Medal,   Just wanted to send you an FYI about James [name], the gentleman we saw with the left TMA earlier this month. I know he has a follow up appointment with you on 02/03 but it appears he was admitted to [facility] on 1/26 and they are planning for a left BKA for worsening of his osteomyelitis.   Thank you  Sanjuana Letters IM Resident PGY-1

## 2020-08-22 NOTE — Telephone Encounter (Signed)
Appointment canceled per Dr Hubert Azure, Lanice Schwab, RN

## 2020-08-25 ENCOUNTER — Ambulatory Visit: Payer: Medicare Other | Admitting: Infectious Disease

## 2021-12-08 DIAGNOSIS — Z89611 Acquired absence of right leg above knee: Secondary | ICD-10-CM | POA: Insufficient documentation

## 2022-08-30 IMAGING — CT CT ANGIO CHEST-ABD-PELV FOR DISSECTION W/ AND WO/W CM
2 of 7 series · 12 of 46 positions shown, 14 images · non-contrast
Comparison: CT of the pelvis dated 10/28/2019.

CLINICAL DATA: Chest pain and back pain. Concern for aortic
dissection. Pain status post fall 2 days ago with left toe
amputation 1 week ago.

EXAM:
CT ANGIOGRAPHY CHEST, ABDOMEN AND PELVIS
TECHNIQUE: Non-contrast CT of the chest was initially obtained.

[Series 6: dissection 3.0 i30f 3 · axial · 0.80mm/px · z∈[+522,+1080]mm · 9 of 210 slices shown, 11 images]
[im 12/210  soft-tissue]
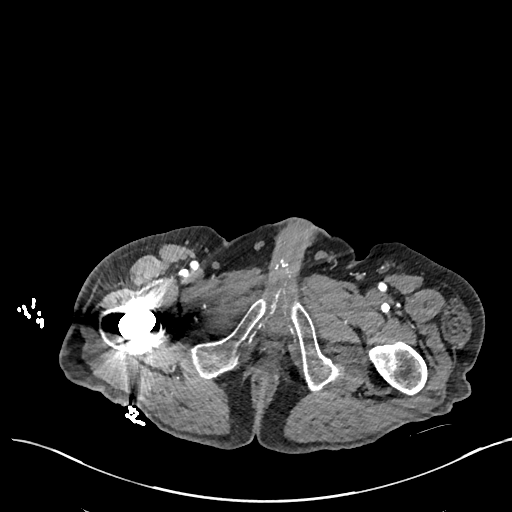
[im 12/210  bone]
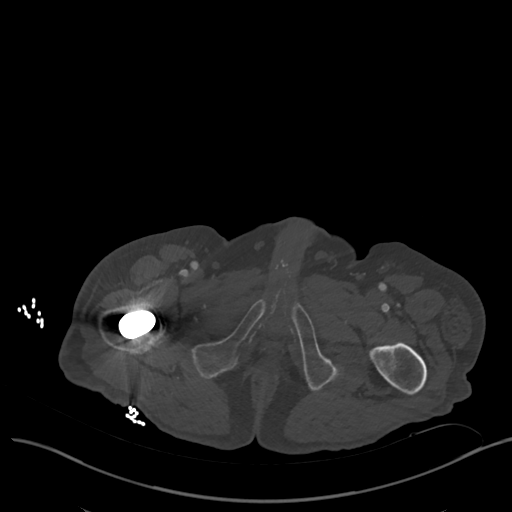
[im 35/210  soft-tissue]
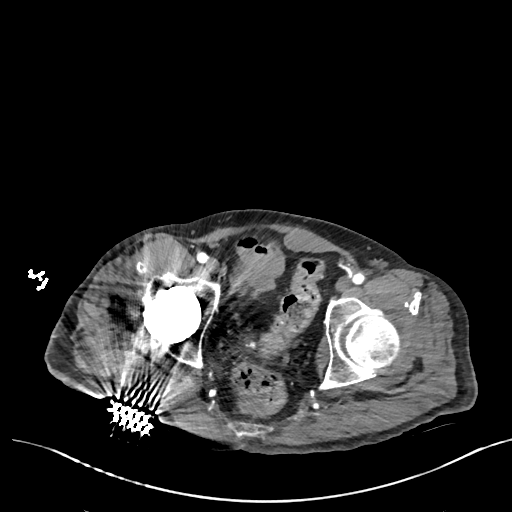
[im 59/210  soft-tissue]
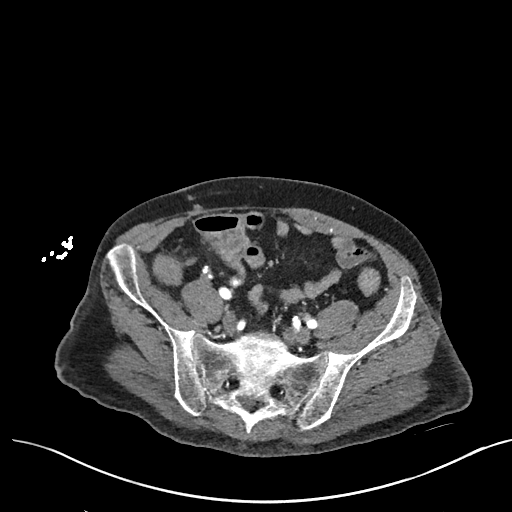
[im 82/210  soft-tissue]
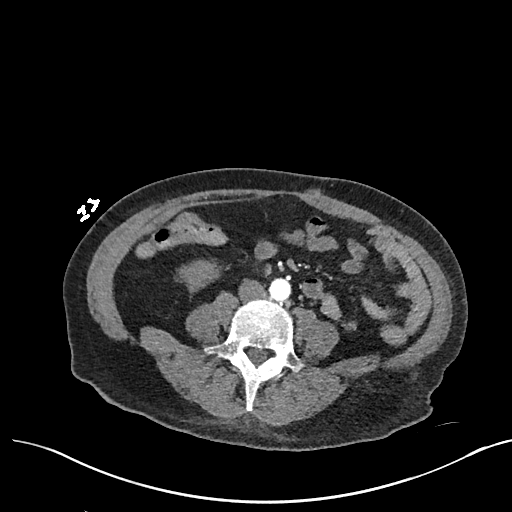
[im 105/210  soft-tissue]
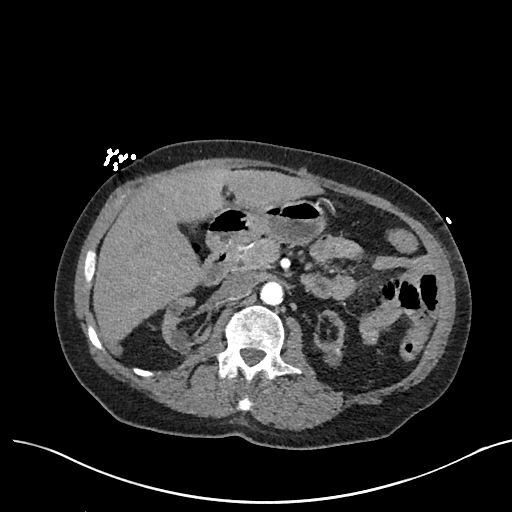
[im 128/210  soft-tissue]
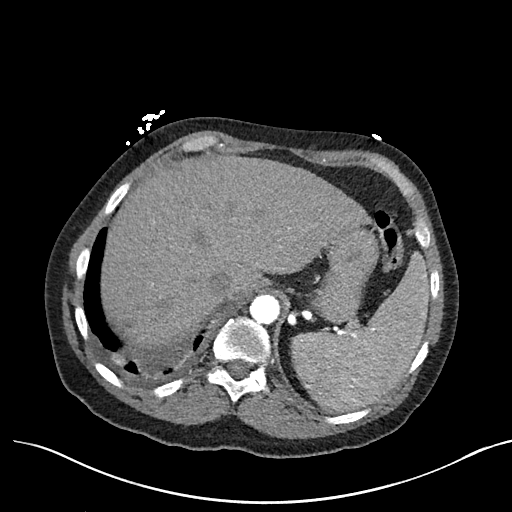
[im 151/210  soft-tissue]
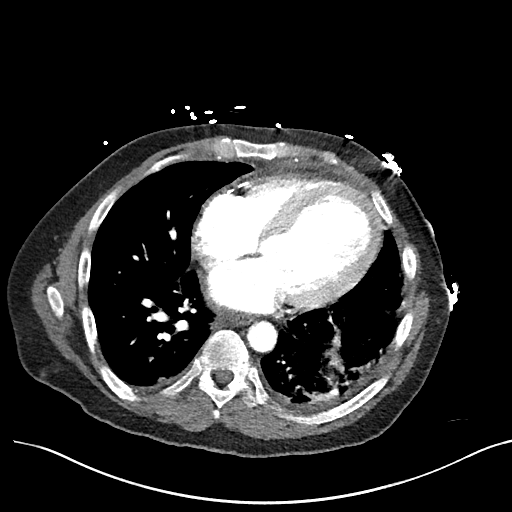
[im 175/210  soft-tissue]
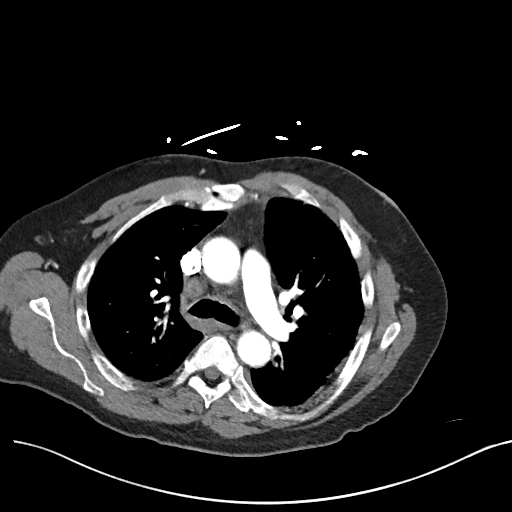
[im 198/210  soft-tissue]
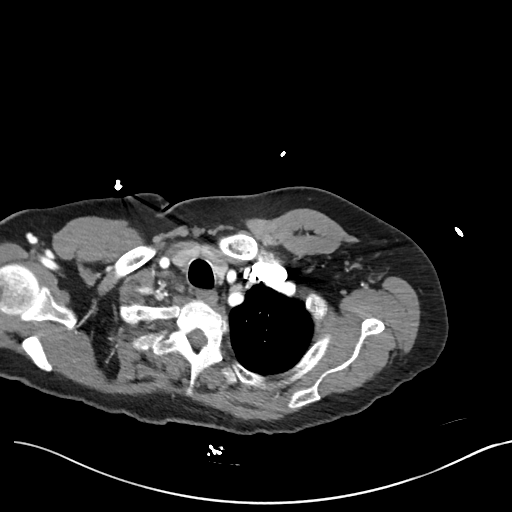
[im 198/210  bone]
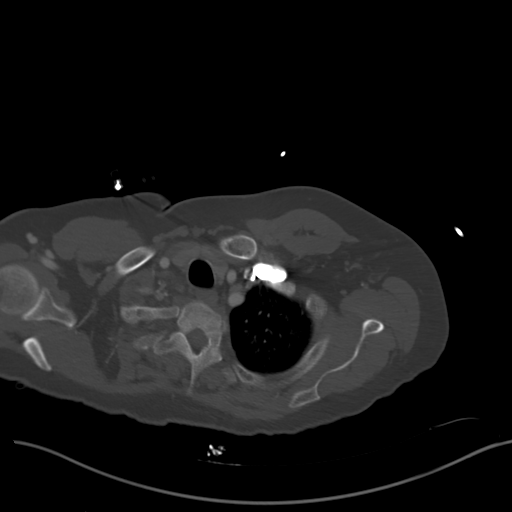

[Series 9: coronals · coronal · 0.72mm/px · 3 of 140 slices shown]
[im 35/140  soft-tissue]
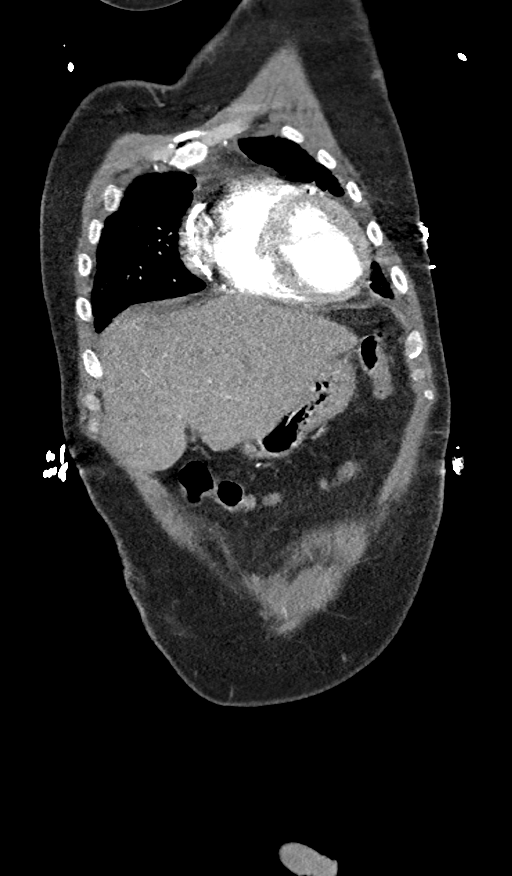
[im 70/140  soft-tissue]
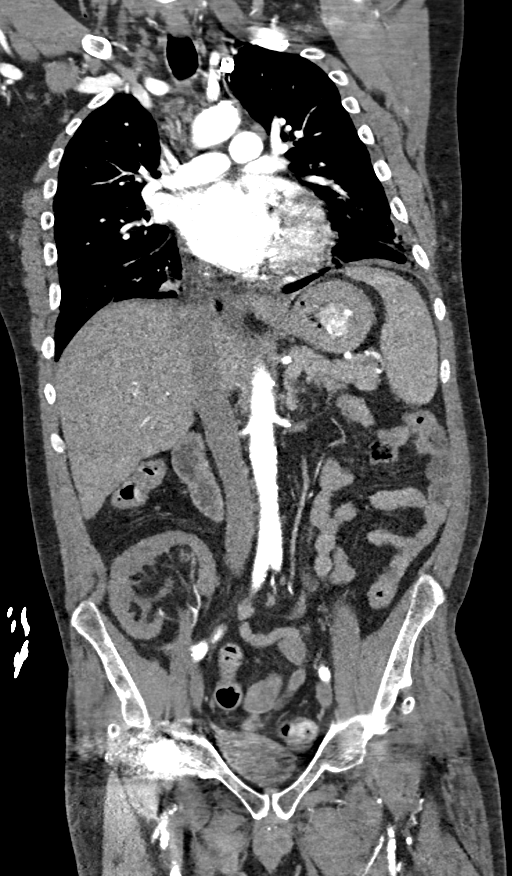
[im 105/140  soft-tissue]
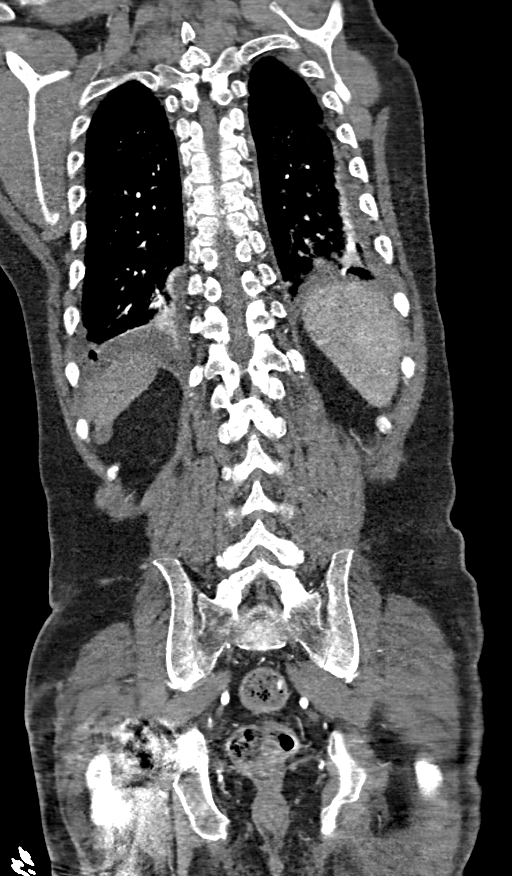

[12 of 46 positions shown; findings below may reference images not displayed]

Multidetector CT imaging through the chest, abdomen and pelvis was
performed using the standard protocol during bolus administration of
intravenous contrast. Multiplanar reconstructed images and MIPs were
obtained and reviewed to evaluate the vascular anatomy.

CONTRAST:  100mL OMNIPAQUE IOHEXOL 350 MG/ML SOLN
FINDINGS: CTA CHEST FINDINGS

Cardiovascular: There is no evidence for a thoracic aortic
dissection or aneurysm. There are atherosclerotic changes of the
thoracic aorta. There is no evidence for an acute pulmonary
embolism. The heart size is moderately enlarged. There is a trace
pericardial effusion. The arch vessels are patent where visualized.

Mediastinum/Nodes:

--there is mild mediastinal adenopathy, likely reactive.

-- No hilar lymphadenopathy.

-- No axillary lymphadenopathy.

-- No supraclavicular lymphadenopathy.

-- Normal thyroid gland where visualized.

-  Unremarkable esophagus.

Lungs/Pleura: There are small bilateral pleural effusions with
adjacent atelectasis. There is diffuse bilateral interlobular septal
thickening with hazy bilateral ground-glass airspace opacification.
There is no definite pneumothorax.

Musculoskeletal: No chest wall abnormality. No bony spinal canal
stenosis.

Review of the MIP images confirms the above findings.

CTA ABDOMEN AND PELVIS FINDINGS

VASCULAR

Aorta: Normal caliber aorta without aneurysm, dissection, vasculitis
or significant stenosis. Atherosclerotic changes are noted.

Celiac: Patent without evidence of aneurysm, dissection, vasculitis
or significant stenosis.

SMA: Patent without evidence of aneurysm, dissection, vasculitis or
significant stenosis.

Renals: Both renal arteries are highly stenotic.

IMA: Patent without evidence of aneurysm, dissection, vasculitis or
significant stenosis.

Inflow: Patent without evidence of aneurysm, dissection, vasculitis
or significant stenosis.

Veins: No obvious venous abnormality within the limitations of this
arterial phase study.

Review of the MIP images confirms the above findings.

NON-VASCULAR

Hepatobiliary: Small hepatic cysts are again noted. Status post
cholecystectomy.There is no biliary ductal dilation.

Pancreas: Normal contours without ductal dilatation. No
peripancreatic fluid collection.

Spleen: The spleen is borderline enlarged.

Adrenals/Urinary Tract:

--Adrenal glands: Unremarkable.

--Right kidney/ureter: The right kidney is atrophic.

--Left kidney/ureter: The left kidney is atrophic. There is a
growing exophytic nodule rising from the lower pole measuring
approximately 25 Hounsfield units and 2.1 cm. The left kidney is
atrophic. There is a right pelvic transplant kidney in place with
evidence for cortical thinning. There is no hydronephrosis.

--Urinary bladder: Unremarkable.

Stomach/Bowel:

--Stomach/Duodenum: No hiatal hernia or other gastric abnormality.
Normal duodenal course and caliber.

--Small bowel: Unremarkable.

--Colon: Unremarkable.

--Appendix: Normal.

Lymphatic:

--No retroperitoneal lymphadenopathy.

--No mesenteric lymphadenopathy.

--there are few mildly enlarged pelvic lymph nodes which are
presumably reactive in etiology.

Reproductive: Not well evaluated secondary to streak artifact.

Other: No ascites or free air. The abdominal wall is normal.

Musculoskeletal. The patient is status post total hip arthroplasty
on the right.

Review of the MIP images confirms the above findings.
IMPRESSION: 1. No acute vascular abnormality.
2. Cardiomegaly with small bilateral pleural effusions and findings
consistent with developing pulmonary edema. An underlying atypical
infectious process is not entirely excluded.
3. Indeterminate growing exophytic nodule rising from the lower pole
the left kidney. This is favored to represent a proteinaceous or
hemorrhagic cyst, however follow-up with an outpatient renal
ultrasound is recommended.
4. Atrophic native kidneys. The right pelvic transplant kidney
demonstrates cortical thinning without hydronephrosis. Findings
could indicate transplant failure.
5. Additional chronic findings as detailed above.

Aortic Atherosclerosis ([id number]).

## 2022-09-19 DIAGNOSIS — Z8679 Personal history of other diseases of the circulatory system: Secondary | ICD-10-CM | POA: Insufficient documentation

## 2022-10-03 ENCOUNTER — Emergency Department (HOSPITAL_COMMUNITY)
Admission: EM | Admit: 2022-10-03 | Discharge: 2022-10-03 | Discharge status: Against Medical Advice | Payer: 59 | Attending: Emergency Medicine | Admitting: Emergency Medicine

## 2022-10-03 ENCOUNTER — Other Ambulatory Visit: Payer: Self-pay

## 2022-10-03 ENCOUNTER — Emergency Department (HOSPITAL_COMMUNITY): Payer: 59

## 2022-10-03 DIAGNOSIS — R109 Unspecified abdominal pain: Secondary | ICD-10-CM | POA: Diagnosis not present

## 2022-10-03 DIAGNOSIS — Z992 Dependence on renal dialysis: Secondary | ICD-10-CM | POA: Insufficient documentation

## 2022-10-03 DIAGNOSIS — Z5321 Procedure and treatment not carried out due to patient leaving prior to being seen by health care provider: Secondary | ICD-10-CM | POA: Diagnosis not present

## 2022-10-03 DIAGNOSIS — R112 Nausea with vomiting, unspecified: Secondary | ICD-10-CM | POA: Insufficient documentation

## 2022-10-03 LAB — CBC
HCT: 36.5 % — ABNORMAL LOW (ref 39.0–52.0)
Hemoglobin: 12.4 g/dL — ABNORMAL LOW (ref 13.0–17.0)
MCH: 36 pg — ABNORMAL HIGH (ref 26.0–34.0)
MCHC: 34 g/dL (ref 30.0–36.0)
MCV: 106.1 fL — ABNORMAL HIGH (ref 80.0–100.0)
Platelets: 99 10*3/uL — ABNORMAL LOW (ref 150–400)
RBC: 3.44 MIL/uL — ABNORMAL LOW (ref 4.22–5.81)
RDW: 16 % — ABNORMAL HIGH (ref 11.5–15.5)
WBC: 4.5 10*3/uL (ref 4.0–10.5)
nRBC: 0 % (ref 0.0–0.2)

## 2022-10-03 LAB — COMPREHENSIVE METABOLIC PANEL
ALT: 43 U/L (ref 0–44)
AST: 35 U/L (ref 15–41)
Albumin: 3.5 g/dL (ref 3.5–5.0)
Alkaline Phosphatase: 135 U/L — ABNORMAL HIGH (ref 38–126)
Anion gap: 19 — ABNORMAL HIGH (ref 5–15)
BUN: 39 mg/dL — ABNORMAL HIGH (ref 6–20)
CO2: 23 mmol/L (ref 22–32)
Calcium: 8.7 mg/dL — ABNORMAL LOW (ref 8.9–10.3)
Chloride: 97 mmol/L — ABNORMAL LOW (ref 98–111)
Creatinine, Ser: 12.51 mg/dL — ABNORMAL HIGH (ref 0.61–1.24)
GFR, Estimated: 4 mL/min — ABNORMAL LOW (ref >60)
Glucose, Bld: 109 mg/dL — ABNORMAL HIGH (ref 70–99)
Potassium: 4.2 mmol/L (ref 3.5–5.1)
Sodium: 139 mmol/L (ref 135–145)
Total Bilirubin: 0.8 mg/dL (ref 0.3–1.2)
Total Protein: 8.3 g/dL — ABNORMAL HIGH (ref 6.5–8.1)

## 2022-10-03 LAB — LIPASE, BLOOD: Lipase: 159 U/L — ABNORMAL HIGH (ref 11–51)

## 2022-10-03 MED ORDER — ONDANSETRON 4 MG PO TBDP
4.0000 mg | ORAL_TABLET | Freq: Once | ORAL | Status: AC | PRN
  Administered 2022-10-03: 4 mg via ORAL
  Filled 2022-10-03: qty 1

## 2022-10-03 NOTE — ED Triage Notes (Signed)
Pt bib [facility] from home with complaints of n/v x2 weeks. Pt states he normally gets this with dialysis but says he "came home one day and started throwing up and never stopped for 2 weeks". Pt states he is concerned that he has a bowel obstruction. Pt dialysis MWF and has not missed any days. EMS VSS

## 2022-10-03 NOTE — ED Notes (Signed)
Pt states that they are leaving  °

## 2022-10-03 NOTE — ED Provider Notes (Signed)
[facility] Provider Note   CSN: [id number] Arrival date & time: 10/03/22  [id number]     History {Add pertinent medical, surgical, social history, OB history to HPI:1} Chief Complaint  Patient presents with   Nausea   Emesis    James [name] is a 52 y.o. male with ESRD on peritoneal dialysis, failed renal transplant, DLBCL, DM, HTN, prolonged QT, HFrEF, h/o NSTEMI, h/o BL LE amputation, BPH, h/o Afib, GERD, glaucoma who presents with N/V.     Pt complains of nausea. Endorse n/v x 2 weeks.  LBM last night.  Dialysis pt, last dialysis was Monday.  Mild abd discomfort.  No cp, sob, fever, chills, cold sxs ***   Emesis      Home Medications Prior to Admission medications   Medication Sig Start Date End Date Taking? Authorizing Provider  acetaminophen (TYLENOL) 500 MG tablet Take 500-1,000 mg by mouth every 6 (six) hours as needed (pain or headaches).     [provider]  amLODipine (NORVASC) 10 MG tablet Take 1 tablet (10 mg total) by mouth daily. 11/04/19 04/14/21  Alma Friendly, MD  aspirin EC 81 MG tablet Take 81 mg by mouth daily. Swallow whole.    [provider]  atorvastatin (LIPITOR) 40 MG tablet TAKE 1 TABLET (40 MG TOTAL) BY MOUTH AT BEDTIME. 08/10/20 08/10/21  Katsadouros, Vasilios, MD  buPROPion (WELLBUTRIN XL) 150 MG 24 hr tablet Take 150 mg by mouth daily.    [provider]  carvedilol (COREG) 25 MG tablet TAKE 1 TABLET (25 MG TOTAL) BY MOUTH TWO TIMES DAILY WITH A MEAL. 08/10/20 08/10/21  Riesa Pope, MD  ceFEPIme 2 g in sodium chloride 0.9 % 100 mL Inject 2 g into the vein every Monday, Wednesday, and Friday at 6 PM. 08/10/20   Katsadouros, Vasilios, MD  Cyanocobalamin (B-12) 500 MCG TABS Take 500 mcg by mouth daily.  08/12/19   [provider]  Darbepoetin Alfa (ARANESP) 100 MCG/0.5ML SOSY injection Inject 0.5 mLs (100 mcg total) into the vein every Monday with hemodialysis.  08/15/20   Riesa Pope, MD  diphenhydrAMINE (BENADRYL) 25 mg capsule Take 25 mg by mouth every 6 (six) hours as needed for itching.     [provider]  doxazosin (CARDURA) 2 MG tablet Take 2 mg by mouth at bedtime.    [provider]  ferric citrate (AURYXIA) 1 GM 210 MG(Fe) tablet Take 210-420 mg by mouth See admin instructions. Take 2 tablets (420 mg) by mouth up to three times daily with meals and 1 tablet (210 mg) with snacks    [provider]  hydrALAZINE (APRESOLINE) 50 MG tablet TAKE 1 TABLET (50 MG TOTAL) BY MOUTH EVERY 8 (EIGHT) HOURS. 08/10/20 08/10/21  Katsadouros, Vasilios, MD  insulin aspart (NOVOLOG FLEXPEN) 100 UNIT/ML FlexPen Inject 10 Units into the skin 3 (three) times daily with meals. Patient taking differently: Inject 2 Units into the skin 3 (three) times daily as needed for high blood sugar (CBG >180). 04/25/17   Mariel Aloe, MD  Insulin Detemir (LEVEMIR FLEXTOUCH) 100 UNIT/ML Pen Inject 15 Units into the skin at bedtime. Patient not taking: Reported on 08/01/2020 09/23/17   Regalado, Jerald Kief A, MD  isosorbide mononitrate (IMDUR) 30 MG 24 hr tablet TAKE 1 TABLET (30 MG TOTAL) BY MOUTH DAILY. 08/10/20 08/10/21  Katsadouros, Vasilios, MD  latanoprost (XALATAN) 0.005 % ophthalmic solution Place 1 drop into both eyes at bedtime.     [provider]  loperamide (IMODIUM) 2 MG capsule Take 2 mg by mouth as needed for diarrhea or loose stools.     [provider]  Melatonin 5 MG CAPS Take 5 mg by mouth at bedtime as needed (sleep).    [provider]  omeprazole (PRILOSEC) 20 MG capsule Take 20 mg by mouth daily.     [provider]  oxyCODONE (OXY IR/ROXICODONE) 5 MG immediate release tablet Take 5 mg by mouth every 6 (six) hours as needed (pain). 07/28/20   [provider]  predniSONE (DELTASONE) 5 MG tablet Take 5 mg by mouth daily with breakfast.     [provider]  triamcinolone cream (KENALOG)  0.1 % Apply 1 application topically daily. (dermatitis) After bathing    [provider]  valGANciclovir (VALCYTE) 450 MG tablet Take 450 mg by mouth every Monday, Wednesday, and Friday with hemodialysis.  08/22/17   [provider]  vancomycin (VANCOREADY) 750 MG/150ML SOLN Inject 150 mLs (750 mg total) into the vein every Monday, Wednesday, and Friday with hemodialysis. 08/10/20   Riesa Pope, MD  warfarin (COUMADIN) 5 MG tablet Take 5-7.5 mg by mouth See admin instructions. Take 1.5 tablets (7.5 mg) by mouth in the evenings on Mondays, Wednesdays, & Fridays. Take 1 tablet (5 mg) by mouth in the evening on Sundays, Tuesdays, Thursdays, & Saturdays. 10/11/19   [provider]  zolpidem (AMBIEN) 10 MG tablet Take 10 mg by mouth at bedtime as needed for sleep.     [provider]      Allergies    Tramadol hcl, Nsaids, and Darvon [propoxyphene]    Review of Systems   Review of Systems  Gastrointestinal:  Positive for vomiting.   Review of systems {pos/neg:18640::"Negative","Positive"} for ***.  A 10 point review of systems was performed and is negative unless otherwise reported in HPI.  Physical Exam Updated Vital Signs BP (!) 160/88   Pulse 98   Temp 98.2 F (36.8 C)   Resp 17   SpO2 100%  Physical Exam General: Normal appearing {Desc; male/male:11659}, lying in bed.  HEENT: PERRLA, Sclera anicteric, MMM, trachea midline.  Cardiology: RRR, no murmurs/rubs/gallops. BL radial and DP pulses equal bilaterally.  Resp: Normal respiratory rate and effort. CTAB, no wheezes, rhonchi, crackles.  Abd: Soft, non-tender, non-distended. No rebound tenderness or guarding.  GU: Deferred. MSK: No peripheral edema or signs of trauma. Extremities without deformity or TTP. No cyanosis or clubbing. Skin: warm, dry. No rashes or lesions. Back: No CVA tenderness Neuro: A&Ox4, CNs II-XII grossly intact. MAEs. Sensation grossly intact.  Psych: Normal mood and  affect.   ED Results / Procedures / Treatments   Labs (all labs ordered are listed, but only abnormal results are displayed) Labs Reviewed  CBC - Abnormal; Notable for the following components:      Result Value   RBC 3.44 (*)    Hemoglobin 12.4 (*)    HCT 36.5 (*)    MCV 106.1 (*)    MCH 36.0 (*)    RDW 16.0 (*)    Platelets 99 (*)    All other components within normal limits  LIPASE, BLOOD  COMPREHENSIVE METABOLIC PANEL    EKG None  Radiology No results found.  Procedures Procedures  {Document cardiac monitor, telemetry assessment procedure when appropriate:1}  Medications Ordered in ED Medications  ondansetron (ZOFRAN-ODT) disintegrating tablet 4 mg (4 mg Oral Given 10/03/22 [id number])    ED Course/ Medical Decision Making/ A&P  Medical Decision Making Amount and/or Complexity of Data Reviewed Labs: ordered.  Risk Prescription drug management.    This patient presents to the ED for concern of ***, this involves an extensive number of treatment options, and is a complaint that carries with it a high risk of complications and morbidity.  I considered the following differential and admission for this acute, potentially life threatening condition.   MDM:    ***  Clinical Course as of 10/03/22 0813  Wed Oct 03, 2022  0812 WBC: 4.5 No leukocytosis [HN]  0813 Hemoglobin(!): 12.4 Increased from BL 8-9, consider volume contraction/dehydration [HN]    Clinical Course User Index [HN] Audley Hose, MD    Labs: I Ordered, and personally interpreted labs.  The pertinent results include:  ***  Imaging Studies ordered: I ordered imaging studies including *** I independently visualized and interpreted imaging. I agree with the radiologist interpretation  Additional history obtained from ***.  External records from outside source obtained and reviewed including ***  Cardiac Monitoring: The patient was maintained on a cardiac monitor.  I  personally viewed and interpreted the cardiac monitored which showed an underlying rhythm of: ***  Reevaluation: After the interventions noted above, I reevaluated the patient and found that they have :{resolved/improved/worsened:23923::"improved"}  Social Determinants of Health: ***  Disposition:  ***  Co morbidities that complicate the patient evaluation  Past Medical History:  Diagnosis Date   Anemia    Avascular necrosis (HCC)    BPH (benign prostatic hyperplasia)    Complication of anesthesia    slow to wake up   ESRD (end stage renal disease) ([place])    failed kidney transplant   GERD (gastroesophageal reflux disease)    Glaucoma    History of blood transfusion    Hypertension    Hypertriglyceridemia    Influenza A    Lymphoproliferative disorder ([place])    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 He then got HD in [facility] until getting a renal transplant in 2009 at [facility], [place].  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   Tachycardia    Thrombocytopenia ([place])    Transplant recipient    Type 2 diabetes mellitus ([place])    type 2 - pt states no meds, diet controlled as of 04/22/20     Medicines Meds ordered this encounter  Medications   ondansetron (ZOFRAN-ODT) disintegrating tablet 4 mg    I have reviewed the  patients home medicines and have made adjustments as needed  Problem List / ED Course: Problem List Items Addressed This Visit   None        {Document critical care time when appropriate:1} {Document  review of labs and clinical decision tools ie heart score, Chads2Vasc2 etc:1}  {Document your independent review of radiology images, and any outside records:1} {Document your discussion with family members, caretakers, and with consultants:1} {Document social determinants of health affecting pt's care:1} {Document your decision making why or why not admission, treatments were needed:1}  This note was created using dictation software, which may contain spelling or grammatical errors.

## 2022-10-03 NOTE — ED Provider Triage Note (Signed)
Emergency Medicine Provider Triage Evaluation Note  [place] , a 52 y.o. male  was evaluated in triage.  Pt complains of nausea. Endorse n/v x 2 weeks.  LBM last night.  Dialysis pt, last dialysis was Monday.  Mild abd discomfort.  No cp, sob, fever, chills, cold sxs  Review of Systems  Positive: As above Negative: As above  Physical Exam  BP (!) 160/88   Pulse 98   Temp 98.2 F (36.8 C)   Resp 17   SpO2 100%  Gen:   Awake, no distress   Resp:  Normal effort  MSK:   Moves extremities without difficulty  Other:    Medical Decision Making  Medically screening exam initiated at 7:44 AM.  Appropriate orders placed.  Jalean Horstman Newcom was informed that the remainder of the evaluation will be completed by another provider, this initial triage assessment does not replace that evaluation, and the importance of remaining in the ED until their evaluation is complete.     Domenic Moras, PA-C 10/03/22 [phone]

## 2023-11-26 ENCOUNTER — Encounter (HOSPITAL_COMMUNITY)
Admission: RE | Disposition: A | Discharge status: Home / Self Care | Payer: Self-pay | Source: Home / Self Care | Attending: Nephrology

## 2023-11-26 ENCOUNTER — Other Ambulatory Visit: Payer: Self-pay

## 2023-11-26 ENCOUNTER — Ambulatory Visit (HOSPITAL_COMMUNITY)
Admission: RE | Admit: 2023-11-26 | Discharge: 2023-11-26 | Disposition: A | Discharge status: Home / Self Care | Attending: Nephrology | Admitting: Nephrology

## 2023-11-26 DIAGNOSIS — Y832 Surgical operation with anastomosis, bypass or graft as the cause of abnormal reaction of the patient, or of later complication, without mention of misadventure at the time of the procedure: Secondary | ICD-10-CM | POA: Diagnosis not present

## 2023-11-26 DIAGNOSIS — E1122 Type 2 diabetes mellitus with diabetic chronic kidney disease: Secondary | ICD-10-CM | POA: Diagnosis not present

## 2023-11-26 DIAGNOSIS — T82858A Stenosis of vascular prosthetic devices, implants and grafts, initial encounter: Secondary | ICD-10-CM | POA: Diagnosis present

## 2023-11-26 DIAGNOSIS — N400 Enlarged prostate without lower urinary tract symptoms: Secondary | ICD-10-CM | POA: Insufficient documentation

## 2023-11-26 DIAGNOSIS — I120 Hypertensive chronic kidney disease with stage 5 chronic kidney disease or end stage renal disease: Secondary | ICD-10-CM | POA: Diagnosis not present

## 2023-11-26 DIAGNOSIS — Z79899 Other long term (current) drug therapy: Secondary | ICD-10-CM | POA: Insufficient documentation

## 2023-11-26 DIAGNOSIS — D631 Anemia in chronic kidney disease: Secondary | ICD-10-CM | POA: Insufficient documentation

## 2023-11-26 DIAGNOSIS — N250 Renal osteodystrophy: Secondary | ICD-10-CM | POA: Insufficient documentation

## 2023-11-26 DIAGNOSIS — N186 End stage renal disease: Secondary | ICD-10-CM | POA: Diagnosis not present

## 2023-11-26 DIAGNOSIS — F17210 Nicotine dependence, cigarettes, uncomplicated: Secondary | ICD-10-CM | POA: Insufficient documentation

## 2023-11-26 DIAGNOSIS — T8612 Kidney transplant failure: Secondary | ICD-10-CM | POA: Diagnosis not present

## 2023-11-26 DIAGNOSIS — Z8572 Personal history of non-Hodgkin lymphomas: Secondary | ICD-10-CM | POA: Insufficient documentation

## 2023-11-26 HISTORY — PX: A/V SHUNT INTERVENTION: CATH118220

## 2023-11-26 SURGERY — A/V SHUNT INTERVENTION
Anesthesia: LOCAL

## 2023-11-26 MED ORDER — FENTANYL CITRATE (PF) 100 MCG/2ML IJ SOLN
INTRAMUSCULAR | Status: DC | PRN
  Administered 2023-11-26: 25 ug via INTRAVENOUS

## 2023-11-26 MED ORDER — FENTANYL CITRATE (PF) 100 MCG/2ML IJ SOLN
INTRAMUSCULAR | Status: AC
  Filled 2023-11-26: qty 2

## 2023-11-26 MED ORDER — HEPARIN (PORCINE) IN NACL 2000-0.9 UNIT/L-% IV SOLN
INTRAVENOUS | Status: DC | PRN
  Administered 2023-11-26: 1000 mL

## 2023-11-26 MED ORDER — IODIXANOL 320 MG/ML IV SOLN
INTRAVENOUS | Status: DC | PRN
  Administered 2023-11-26: 15 mL

## 2023-11-26 MED ORDER — MIDAZOLAM HCL 2 MG/2ML IJ SOLN
INTRAMUSCULAR | Status: DC | PRN
  Administered 2023-11-26: 1 mg via INTRAVENOUS

## 2023-11-26 MED ORDER — LIDOCAINE HCL (PF) 1 % IJ SOLN
INTRAMUSCULAR | Status: AC
  Filled 2023-11-26: qty 30

## 2023-11-26 MED ORDER — LIDOCAINE HCL (PF) 1 % IJ SOLN
INTRAMUSCULAR | Status: DC | PRN
  Administered 2023-11-26: 5 mL via INTRADERMAL

## 2023-11-26 MED ORDER — MIDAZOLAM HCL 2 MG/2ML IJ SOLN
INTRAMUSCULAR | Status: AC
  Filled 2023-11-26: qty 2

## 2023-11-26 SURGICAL SUPPLY — 9 items
BAG SNAP BAND KOVER 36X36 (MISCELLANEOUS) ×1 IMPLANT
BALLOON MUSTANG 7X80X75 (BALLOONS) IMPLANT
CATH ANGIO 5F BER2 65CM (CATHETERS) IMPLANT
COVER DOME SNAP 22 D (MISCELLANEOUS) ×1 IMPLANT
GUIDEWIRE ANGLED .035 180CM (WIRE) IMPLANT
SHEATH PINNACLE R/O II 6F 4CM (SHEATH) IMPLANT
SYR MEDALLION 10ML (SYRINGE) IMPLANT
TRAY PV CATH (CUSTOM PROCEDURE TRAY) ×1 IMPLANT
WIRE BENTSON .035X145CM (WIRE) IMPLANT

## 2023-11-26 NOTE — Discharge Instructions (Signed)

## 2023-11-26 NOTE — Op Note (Signed)
 Patient presents with decreased access flows in a right upper arm straight graft placed  on 04/25/20. On exam the RUA straight AVG is hyperpulsatile.   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  Most of the flow before the angioplasty was through the collateral.   2) The body of the graft, central veins, and inflow were widely patent. 3) This right upper arm straight graft remains amenable to future percutaneous intervention. If lesion recurs in < 3months, would favor stent graft placement.   Description of procedure: The right upper arm was prepped and draped in the usual fashion. The right upper arm straight AVG was cannulated (16109) in the arterial limb of the graft in an antegrade direction with an 18G Angiocath needle and then a 6 Fr sheath was inserted by guidewire exchange technique. The angiogram revealed a patent body of the graft, venous anastomosis, collateral with more than 95% of the flow through it present beyond the venous anastomosis, 90% focal axillary vein stenosis stenosis. The body of the graft, central veins and inflow were patent.  A guidewire was easily advanced past the outflow stenosis and parked in the central veins. I then advanced a 7 x 8 Mustang angioplasty balloon through the antegrade sheath over the guidewire to the level of the axillary vein stenosis. Venous angioplasty was performed to 100% balloon effacement with approximately 16-18 ATM of pressure via a hand syringe assembly.   Final arteriogram and completion venogram revealed no evidence of extravasation or dissection, more rapid access flows through the graft and 10% residual stenosis in the venous anastomosis.  Hemostasis: A 3-0 ethilon purse string suture was placed at the cannulation site on removal of the  sheath.  Sedation: 1 mg Versed , 25 mcg Fentanyl .  Sedation time: 11 minutes  Contrast. 15 mL  Monitoring: Because of the patient's comorbid conditions and sedation during the procedure, continuous EKG monitoring and O2 saturation monitoring was performed throughout the procedure by the RN. There were no abnormal arrhythmias encountered.  Complications: None.   Diagnoses: I87.1 Stricture of vein  N18.6 ESRD T82.858A Stricture of access  Procedure Coding:  [phone] Cannulation and angiogram of fistula, venous angioplasty (AVG VA) U9811 Contrast  Recommendations:  1. Continue to cannulate the fistula with 15G needles.  2. Refer back for problems with flows. 3. Remove the suture next treatment.   Discharge: The patient was discharged home in stable condition. The patient was given education regarding the care of the dialysis access AVF and specific instructions in case of any problems.

## 2023-11-26 NOTE — H&P (Addendum)
 Chief Complaint: Decreased flows  Interval H&P  The patient has presented today for an angiogram/ angioplasty.  Various methods of treatment have been discussed with the patient.  After consideration of risk, benefits and other options for treatment, the patient has consented to a angiogram/ angioplasty with  possible stent placement.   Risks of angiogram with potential angioplasty and stenting if needed.contrast reaction, extravasation/ bleeding, dissection, hypotension and death were explained to the patient.  The patient's history has been reviewed and the patient has been examined, no changes in status.  Stable for angiogram/angioplasty  I have reviewed the patient's chart and labs.  Questions were answered to the patient's satisfaction.  Assessment/Plan: ESRD dialyzing MWF Decreased access flows - planning on angiogram with possibly angioplasty. Renal osteodystrophy - continue binders per home regimen. Anemia - managed with ESA's and IV iron  at dialysis center. HTN - resume home regimen.   HPI: James [name] is an 53 y.o. male with history of hypertension, failed kidney transplant, obese cell lymphoma, diabetes, BPH, ESRD being referred for decreasing flows.  Has had a right brachiocephalic fistula placed in June 11, 2017 by Dr. Alfonse Iha as well as a left upper arm fistula placement.  She has a left upper arm AV graft placement on April 25, 2020 by Dr. Nolene Baumgarten.  ROS Per HPI.  Chemistry and CBC: Creatinine, Ser  Date/Time Value Ref Range Status  10/03/2022 07:34 AM 12.51 (H) 0.61 - 1.24 mg/dL Final  16/04/9603 54:09 AM 9.74 (H) 0.61 - 1.24 mg/dL Final  81/19/1478 29:56 AM 6.91 (H) 0.61 - 1.24 mg/dL Final  21/30/8657 84:69 AM 9.35 (H) 0.61 - 1.24 mg/dL Final    Comment:    DELTA CHECK NOTED DIALYSIS   08/07/2020 12:01 AM 5.66 (H) 0.61 - 1.24 mg/dL Final    Comment:    DELTA CHECK NOTED  08/06/2020 08:25 AM 10.63 (H) 0.61 - 1.24 mg/dL Final   62/95/2841 32:44 AM 7.99 (H) 0.61 - 1.24 mg/dL Final  07/25/7251 66:44 AM 5.33 (H) 0.61 - 1.24 mg/dL Final    Comment:    DELTA CHECK NOTED  08/03/2020 12:48 PM 2.94 (H) 0.61 - 1.24 mg/dL Final    Comment:    DELTA CHECK NOTED DIALYSIS   08/03/2020 02:49 AM 8.80 (H) 0.61 - 1.24 mg/dL Final    Comment:    DELTA CHECK NOTED  08/02/2020 06:28 AM 14.21 (H) 0.61 - 1.24 mg/dL Final  03/47/4259 56:38 AM 12.20 (H) 0.61 - 1.24 mg/dL Final  75/64/3329 51:88 AM 12.15 (H) 0.61 - 1.24 mg/dL Final  41/66/0630 16:01 AM 17.60 (H) 0.61 - 1.24 mg/dL Final  09/32/3557 32:20 AM 8.89 (H) 0.61 - 1.24 mg/dL Final    Comment:    DELTA CHECK NOTED  11/03/2019 03:32 AM 14.31 (H) 0.61 - 1.24 mg/dL Final  25/42/7062 37:62 AM 11.96 (H) 0.61 - 1.24 mg/dL Final  83/15/1761 60:73 AM 11.49 (H) 0.61 - 1.24 mg/dL Final  71/12/2692 85:46 AM 12.73 (H) 0.61 - 1.24 mg/dL Final  27/09/5007 38:18 AM 13.29 (H) 0.61 - 1.24 mg/dL Final  29/93/7169 67:89 PM 12.94 (H) 0.61 - 1.24 mg/dL Final  38/04/1750 02:58 AM 13.42 (H) 0.61 - 1.24 mg/dL Final  52/77/8242 35:36 AM 16.41 (H) 0.61 - 1.24 mg/dL Final  14/43/1540 08:67 AM 20.92 (H) 0.61 - 1.24 mg/dL Final  61/95/0932 67:12 AM 21.21 (H) 0.61 - 1.24 mg/dL Final  45/80/9983 38:25 AM 21.95 (H) 0.61 - 1.24 mg/dL Final  05/39/7673 41:93 AM 22.49 (H) 0.61 -  1.24 mg/dL Final  78/29/5621 30:86 AM 21.98 (H) 0.61 - 1.24 mg/dL Final  57/84/6962 95:28 PM 20.30 (H) 0.61 - 1.24 mg/dL Final  41/32/4401 02:72 AM 19.90 (H) 0.61 - 1.24 mg/dL Final  53/66/4403 47:42 AM 19.55 (H) 0.61 - 1.24 mg/dL Final  59/56/3875 64:33 AM 19.87 (H) 0.61 - 1.24 mg/dL Final  29/51/8841 66:06 AM 19.69 (H) 0.61 - 1.24 mg/dL Final  30/16/0109 32:35 AM 11.23 (H) 0.61 - 1.24 mg/dL Final  57/32/2025 42:70 AM 9.56 (H) 0.61 - 1.24 mg/dL Final  62/37/6283 15:17 PM 8.03 (H) 0.61 - 1.24 mg/dL Final  61/60/7371 06:26 AM 7.34 (H) 0.61 - 1.24 mg/dL Final  94/85/4627 03:50 AM 5.28 (H) 0.61 - 1.24 mg/dL Final  09/38/1829  93:71 AM 6.31 (H) 0.61 - 1.24 mg/dL Final  69/67/8938 10:17 PM 5.26 (H) 0.61 - 1.24 mg/dL Final    Comment:    DIALYSIS  04/20/2017 05:44 AM 11.42 (H) 0.61 - 1.24 mg/dL Final  51/08/5850 77:82 AM 11.78 (H) 0.61 - 1.24 mg/dL Final  42/35/3614 43:15 PM 11.44 (H) 0.61 - 1.24 mg/dL Final  40/02/6760 95:09 PM 11.37 (H) 0.61 - 1.24 mg/dL Final  32/67/1245 80:99 PM 12.70 (H) 0.61 - 1.24 mg/dL Final  83/38/2505 39:76 PM 12.08 (H) 0.61 - 1.24 mg/dL Final  73/41/9379 02:40 AM 3.13 (H) 0.61 - 1.24 mg/dL Final  97/35/3299 24:26 AM 3.20 (H) 0.61 - 1.24 mg/dL Final  83/41/9622 29:79 PM 3.54 (H) 0.61 - 1.24 mg/dL Final  89/21/1941 74:08 PM 3.80 (H) 0.61 - 1.24 mg/dL Final  14/48/1856 31:49 AM 4.13 (H) 0.61 - 1.24 mg/dL Final  70/26/3785 88:50 AM 2.17 (H) 0.50 - 1.35 mg/dL Final   No results for input(s): "NA", "K", "CL", "CO2", "GLUCOSE", "BUN", "CREATININE", "CALCIUM ", "PHOS" in the last 168 hours.  Invalid input(s): "ALB" No results for input(s): "WBC", "NEUTROABS", "HGB", "HCT", "MCV", "PLT" in the last 168 hours. Liver Function Tests: No results for input(s): "AST", "ALT", "ALKPHOS", "BILITOT", "PROT", "ALBUMIN" in the last 168 hours. No results for input(s): "LIPASE", "AMYLASE" in the last 168 hours. No results for input(s): "AMMONIA" in the last 168 hours. Cardiac Enzymes: No results for input(s): "CKTOTAL", "CKMB", "CKMBINDEX", "TROPONINI" in the last 168 hours. Iron  Studies: No results for input(s): "IRON ", "TIBC", "TRANSFERRIN", "FERRITIN" in the last 72 hours. PT/INR: @LABRCNTIP (inr:5)  Xrays/Other Studies: )No results found for this or any previous visit (from the past 48 hours). No results found.  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 He then got HD in [facility] until getting a renal transplant in 2009 at [place], DD[facility].  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   Tachycardia    Thrombocytopenia (HCC)    Transplant recipient    Type 2 diabetes mellitus (HCC)    type 2 - pt states no meds, diet controlled as of 04/22/20    PSH:   Past Surgical History:  Procedure Laterality Date   AV FISTULA PLACEMENT Right 11/02/2019   Procedure: ARTERIOVENOUS (AV) FISTULA CREATION RIGHT UPPER ARM;  Surgeon: Richrd Char, MD;  Location: [facility] OR;  Service: Vascular;  Laterality: Right;   AV FISTULA PLACEMENT Right 04/25/2020   Procedure: INSERTION OF ARTERIOVENOUS (AV) GORE-TEX GRAFT ARM;  Surgeon: Richrd Char, MD;  Location: [facility] OR;  Service: Vascular;  Laterality: Right;   BASCILIC VEIN TRANSPOSITION Right 11/02/2019   Procedure: Right Cephalic Vein Transposition;  Surgeon: Richrd Char, MD;  Location: [facility] OR;  Service: Vascular;  Laterality: Right;   BIOPSY  03/31/2019   Procedure: BIOPSY;  Surgeon: Ozell Blunt, MD;  Location: [facility] ENDOSCOPY;  Service: Endoscopy;;   BIOPSY  08/05/2020   Procedure:  BIOPSY;  Surgeon: Janel Medford, MD;  Location: [facility] ENDOSCOPY;  Service: Endoscopy;;   CAPD REMOVAL Left 11/04/2019   Procedure: CONTINUOUS AMBULATORY PERITONEAL DIALYSIS  (CAPD) CATHETER REMOVAL;  Surgeon: Shela Derby, MD;  Location[facility] OR;  Service: General;  Laterality: Left;   CHOLECYSTECTOMY     COLONOSCOPY WITH PROPOFOL  N/A 03/31/2019   Procedure: COLONOSCOPY WITH PROPOFOL ;  Surgeon: Ozell Blunt, MD;  Location: [facility] ENDOSCOPY;  Service: Endoscopy;  Laterality: N/A;   COLONOSCOPY WITH PROPOFOL  N/A 08/05/2020   Procedure: COLONOSCOPY WITH PROPOFOL ;  Surgeon: Janel Medford, MD;  Location: [facility] ENDOSCOPY;  Service: Endoscopy;  Laterality: N/A;   ESOPHAGOGASTRODUODENOSCOPY (EGD) WITH PROPOFOL  N/A 08/05/2020   Procedure: ESOPHAGOGASTRODUODENOSCOPY (EGD) WITH PROPOFOL ;  Surgeon: Janel Medford, MD;  Location: [facility] ENDOSCOPY;  Service: Endoscopy;  Laterality: N/A;   HIP SURGERY     INSERTION OF DIALYSIS CATHETER Left 04/24/2017   Procedure: EXCHANGE  OF DIALYSIS CATHETER;  Surgeon: Mayo Speck, MD;  Location: [facility] OR;  Service: Vascular;  Laterality: Left;   INSERTION OF DIALYSIS CATHETER Left 11/02/2019   Procedure: INSERTION OF TUNNELED DIALYSIS CATHETER LEFT INTERNAL JUGULAR;  Surgeon: Richrd Char, MD;  Location: [facility] OR;  Service: Vascular;  Laterality: Left;   JOINT REPLACEMENT Right    hip   KIDNEY TRANSPLANT Right    LEFT HEART CATH AND CORONARY ANGIOGRAPHY N/A 08/09/2020   Procedure: LEFT HEART CATH AND CORONARY ANGIOGRAPHY;  Surgeon: Arleen Lacer, MD;  Location: [facility] INVASIVE CV LAB;  Service: Cardiovascular;  Laterality: N/A;   PERITONEAL CATHETER INSERTION Left 08/2017   POLYPECTOMY  03/31/2019   Procedure: POLYPECTOMY;  Surgeon: Ozell Blunt, MD;  Location: [facility] ENDOSCOPY;  Service: Endoscopy;;   POLYPECTOMY  08/05/2020   Procedure: POLYPECTOMY;  Surgeon: Janel Medford, MD;  Location: [facility] ENDOSCOPY;  Service: Endoscopy;;   VENOGRAM N/A 11/02/2019   Procedure: Johanna Mutter;   Surgeon: Richrd Char, MD;  Location: [facility] OR;  Service: Vascular;  Laterality: N/A;  Allergies:  Allergies  Allergen Reactions   Tramadol Hcl Shortness Of Breath and Other (See Comments)    Tramadol HCl = Shortness of breath and Tramadol = Rash   Nsaids Other (See Comments)    Was told by MD to not take these   Darvon [Propoxyphene] Rash and Other (See Comments)    Broke out on eyelid     Medications:   Prior to Admission medications   Medication Sig Start Date End Date Taking? Authorizing Provider  acetaminophen  (TYLENOL ) 500 MG tablet Take 500-1,000 mg by mouth every 6 (six) hours as needed (pain or headaches).    Yes [provider]  amLODipine  (NORVASC ) 10 MG tablet Take 1 tablet (10 mg total) by mouth daily. 11/04/19 11/19/23 Yes Ezenduka, Nkeiruka J, MD  apixaban (ELIQUIS) 2.5 MG TABS tablet Take 2.5 mg by mouth 2 (two) times daily. 07/09/22  Yes [provider]  ascorbic acid (VITAMIN C) 500 MG tablet Take 500 mg by mouth daily.   Yes [provider]  aspirin  EC 81 MG tablet Take 81 mg by mouth daily. Swallow whole.   Yes [provider]  atorvastatin  (LIPITOR) 40 MG tablet TAKE 1 TABLET (40 MG TOTAL) BY MOUTH AT BEDTIME. 08/10/20 11/19/23 Yes Katsadouros, Vasilios, MD  calcium  acetate (PHOSLO) 667 MG capsule Take 1,334 mg by mouth 3 (three) times daily. Take 667 mg with snack   Yes [provider]  cinacalcet (SENSIPAR) 30 MG tablet Take 30 mg by mouth daily. 11/01/23  Yes [provider]  diphenhydrAMINE (BENADRYL) 25 mg capsule Take 25 mg by mouth every 6 (six) hours as needed for itching.    Yes [provider]  gabapentin  (NEURONTIN ) 100 MG capsule Take 100 mg by mouth 2 (two) times daily.   Yes [provider]  isosorbide  mononitrate (IMDUR ) 30 MG 24 hr tablet TAKE 1 TABLET (30 MG TOTAL) BY MOUTH DAILY. 08/10/20 11/19/23 Yes Katsadouros, Vasilios, MD  latanoprost  (XALATAN ) 0.005 % ophthalmic solution Place  1 drop into both eyes at bedtime.    Yes [provider]  loperamide (IMODIUM) 2 MG capsule Take 2 mg by mouth as needed for diarrhea or loose stools.    Yes [provider]  Melatonin 10 MG TABS Take 10 mg by mouth at bedtime as needed (sleep). Alternate with Zolpidem    Yes [provider]  metoprolol  tartrate (LOPRESSOR ) 100 MG tablet Take 100 mg by mouth 2 (two) times daily.   Yes [provider]  multivitamin (RENA-VIT) TABS tablet Take 1 tablet by mouth daily.   Yes [provider]  omeprazole (PRILOSEC) 20 MG capsule Take 20 mg by mouth daily.    Yes [provider]  triamcinolone cream (KENALOG) 0.1 % Apply 1 application  topically daily as needed ((dermatitis) After bathing).   Yes [provider]  valGANciclovir  (VALCYTE ) 450 MG tablet Take 450 mg by mouth every Monday, Wednesday, and Friday. 08/22/17  Yes [provider]  zolpidem  (AMBIEN ) 10 MG tablet Take 10 mg by mouth at bedtime as needed for sleep. Alternate with melatonin   Yes [provider]  Darbepoetin Alfa  (ARANESP ) 100 MCG/0.5ML SOSY injection Inject 0.5 mLs (100 mcg total) into the vein every Monday with hemodialysis. 08/15/20   Katsadouros, Vasilios, MD    Discontinued Meds:   Medications Discontinued During This Encounter  Medication Reason   doxazosin  (CARDURA ) 2 MG tablet Patient Preference   buPROPion (WELLBUTRIN XL) 150 MG 24 hr tablet Patient Preference  carvedilol  (COREG ) 25 MG tablet Patient Preference   Cyanocobalamin (B-12) 500 MCG TABS Patient Preference   ferric citrate  (AURYXIA ) 1 GM 210 MG(Fe) tablet Patient Preference   hydrALAZINE  (APRESOLINE ) 50 MG tablet Patient Preference   ceFEPIme  2 g in sodium chloride  0.9 % 100 mL Completed Course   insulin  aspart (NOVOLOG  FLEXPEN) 100 UNIT/ML FlexPen Patient Preference   Insulin  Detemir (LEVEMIR  FLEXTOUCH) 100 UNIT/ML Pen Patient Preference   oxyCODONE  (OXY IR/ROXICODONE ) 5 MG  immediate release tablet Patient Preference   predniSONE  (DELTASONE ) 5 MG tablet Patient Preference   vancomycin  (VANCOREADY) 750 MG/150ML SOLN Completed Course   warfarin (COUMADIN ) 5 MG tablet Patient Preference    Social History:  reports that he has been smoking cigarettes. He has a 6.5 pack-year smoking history. He has never used smokeless tobacco. He reports that he does not drink alcohol and does not use drugs.  Family History:   Family History  Adopted: Yes    Blood pressure 97/70, pulse 81, resp. rate 12, height 5\' 6"  (1.676 m), weight 69.5 kg, SpO2 98%. GEN: NAD, A&Ox3, NCAT HEENT: No conjunctival pallor, EOMI NECK: Supple, no thyromegaly LUNGS: CTA B/L no rales, rhonchi or wheezing CV: RRR, No M/R/G ABD: SNDNT +BS  EXT:  b/l amputee ACCESS: Right upper arm AV graft pulsatile towards the outflow        Nature Vogelsang, Alveda Aures, MD 11/26/2023, 8:58 AM

## 2023-11-27 ENCOUNTER — Encounter (HOSPITAL_COMMUNITY): Payer: Self-pay | Admitting: Nephrology

## 2024-01-16 ENCOUNTER — Encounter (HOSPITAL_COMMUNITY): Payer: Self-pay

## 2024-01-16 ENCOUNTER — Other Ambulatory Visit: Payer: Self-pay

## 2024-01-16 ENCOUNTER — Emergency Department (HOSPITAL_COMMUNITY)
Admission: EM | Admit: 2024-01-16 | Discharge: 2024-01-16 | Disposition: A | Discharge status: Home / Self Care | Attending: Emergency Medicine | Admitting: Emergency Medicine

## 2024-01-16 DIAGNOSIS — Z992 Dependence on renal dialysis: Secondary | ICD-10-CM | POA: Diagnosis not present

## 2024-01-16 DIAGNOSIS — I509 Heart failure, unspecified: Secondary | ICD-10-CM | POA: Diagnosis not present

## 2024-01-16 DIAGNOSIS — Z7901 Long term (current) use of anticoagulants: Secondary | ICD-10-CM | POA: Insufficient documentation

## 2024-01-16 DIAGNOSIS — Z7982 Long term (current) use of aspirin: Secondary | ICD-10-CM | POA: Diagnosis not present

## 2024-01-16 DIAGNOSIS — D72819 Decreased white blood cell count, unspecified: Secondary | ICD-10-CM | POA: Diagnosis not present

## 2024-01-16 DIAGNOSIS — E1122 Type 2 diabetes mellitus with diabetic chronic kidney disease: Secondary | ICD-10-CM | POA: Insufficient documentation

## 2024-01-16 DIAGNOSIS — N186 End stage renal disease: Secondary | ICD-10-CM | POA: Diagnosis not present

## 2024-01-16 DIAGNOSIS — R112 Nausea with vomiting, unspecified: Secondary | ICD-10-CM | POA: Diagnosis present

## 2024-01-16 DIAGNOSIS — I132 Hypertensive heart and chronic kidney disease with heart failure and with stage 5 chronic kidney disease, or end stage renal disease: Secondary | ICD-10-CM | POA: Diagnosis not present

## 2024-01-16 DIAGNOSIS — Z79899 Other long term (current) drug therapy: Secondary | ICD-10-CM | POA: Insufficient documentation

## 2024-01-16 LAB — CBC WITH DIFFERENTIAL/PLATELET
Abs Immature Granulocytes: 0 10*3/uL (ref 0.00–0.07)
Basophils Absolute: 0 10*3/uL (ref 0.0–0.1)
Basophils Relative: 0 %
Eosinophils Absolute: 0 10*3/uL (ref 0.0–0.5)
Eosinophils Relative: 1 %
HCT: 29.2 % — ABNORMAL LOW (ref 39.0–52.0)
Hemoglobin: 9.2 g/dL — ABNORMAL LOW (ref 13.0–17.0)
Lymphocytes Relative: 25 %
Lymphs Abs: 1 10*3/uL (ref 0.7–4.0)
MCH: 35.4 pg — ABNORMAL HIGH (ref 26.0–34.0)
MCHC: 31.5 g/dL (ref 30.0–36.0)
MCV: 112.3 fL — ABNORMAL HIGH (ref 80.0–100.0)
Monocytes Absolute: 0.2 10*3/uL (ref 0.1–1.0)
Monocytes Relative: 4 %
Neutro Abs: 2.7 10*3/uL (ref 1.7–7.7)
Neutrophils Relative %: 70 %
Platelets: 102 10*3/uL — ABNORMAL LOW (ref 150–400)
RBC: 2.6 MIL/uL — ABNORMAL LOW (ref 4.22–5.81)
RDW: 15.9 % — ABNORMAL HIGH (ref 11.5–15.5)
WBC: 3.8 10*3/uL — ABNORMAL LOW (ref 4.0–10.5)
nRBC: 0 /100{WBCs}
nRBC: 0.5 % — ABNORMAL HIGH (ref 0.0–0.2)

## 2024-01-16 LAB — BASIC METABOLIC PANEL WITH GFR
Anion gap: 13 (ref 5–15)
BUN: 26 mg/dL — ABNORMAL HIGH (ref 6–20)
CO2: 26 mmol/L (ref 22–32)
Calcium: 9.2 mg/dL (ref 8.9–10.3)
Chloride: 99 mmol/L (ref 98–111)
Creatinine, Ser: 9.03 mg/dL — ABNORMAL HIGH (ref 0.61–1.24)
GFR, Estimated: 6 mL/min — ABNORMAL LOW (ref >60)
Glucose, Bld: 92 mg/dL (ref 70–99)
Potassium: 4.6 mmol/L (ref 3.5–5.1)
Sodium: 138 mmol/L (ref 135–145)

## 2024-01-16 LAB — CBG MONITORING, ED: Glucose-Capillary: 96 mg/dL (ref 70–99)

## 2024-01-16 LAB — MAGNESIUM: Magnesium: 1.7 mg/dL (ref 1.7–2.4)

## 2024-01-16 NOTE — ED Provider Triage Note (Signed)
 Emergency Medicine Provider Triage Evaluation Note  James [name] , a 53 y.o. male  was evaluated in triage.  Pt complains of hyperkalemia, nausea/vomiting.  Patient reportedly had blood work drawn before dialysis with an elevated potassium.  Was able to complete his dialysis as scheduled, instructed to come here for repeat blood work.  No chest pain or palpitations at this time.  Review of Systems  Positive: Nausea/vomiting Negative: Chest pain, palpitations  Physical Exam  BP (!) 181/93 (BP Location: Right Arm)   Pulse 92   Temp 98.2 F (36.8 C) (Oral)   Resp 17   Ht 5' 6 (1.676 m)   Wt 69.5 kg   SpO2 100%   BMI 24.73 kg/m  Gen:   Awake, no distress, talking on the phone Resp:  Normal effort, speaking in full sentences MSK:   Moves extremities without difficulty  Other:  Well-appearing  Medical Decision Making  Medically screening exam initiated at 10:54 AM.  Appropriate orders placed.  James [name] was informed that the remainder of the evaluation will be completed by another provider, this initial triage assessment does not replace that evaluation, and the importance of remaining in the ED until their evaluation is complete.  53 year old male presents emergency department concern for hyperkalemia on outpatient blood work, presumed that this blood work was taken before his scheduled dialysis.  Patient only endorses nausea/vomiting overnight.  No chest pain, syncope, palpitations.  Repeat blood work ordered.  Otherwise well-appearing.  Patient evaluated sitting up in chair, fully clothed, limited PE. Orders placed. Patient was counseled that they need to remain in the ED until the completion of their work-up including a full H&P, additional testing and results of any tests.  The patient appears stable and the remainder of the encounter may be completed by another provider.   Bari Roxie HERO, DO 01/16/24 1055

## 2024-01-16 NOTE — ED Provider Notes (Signed)
 [place] EMERGENCY DEPARTMENT AT [facility] Provider Note   CSN: [id number] Arrival date & time: 01/16/24  1035     Patient presents with: Abnormal Lab   James [name] is a 53 y.o. male with PMHx ESRD on MWF dialysis, DM, HTN, CHF, who presents to ED concerned for hyperkalemia. Patient was called this morning by his dialysis center stating that the labs drawn before dialysis were concerning for hyperkalemia around 9. Patient endorses some palpitations/nausea and 2 episodes of vomiting this morning, but is currently without symptoms in ED. Patient denies chest pain or SOB. Denies fever, abdominal pain. Patient stating that he has been compliant with dialysis sessions this week.    Abnormal Lab      Prior to Admission medications   Medication Sig Start Date End Date Taking? Authorizing Provider  acetaminophen  (TYLENOL ) 500 MG tablet Take 500-1,000 mg by mouth every 6 (six) hours as needed (pain or headaches).     [provider]  amLODipine  (NORVASC ) 10 MG tablet Take 1 tablet (10 mg total) by mouth daily. 11/04/19 11/19/23  Ezenduka, Nkeiruka J, MD  apixaban (ELIQUIS) 2.5 MG TABS tablet Take 2.5 mg by mouth 2 (two) times daily. 07/09/22   [provider]  ascorbic acid (VITAMIN C) 500 MG tablet Take 500 mg by mouth daily.    [provider]  aspirin  EC 81 MG tablet Take 81 mg by mouth daily. Swallow whole.    [provider]  atorvastatin  (LIPITOR) 40 MG tablet TAKE 1 TABLET (40 MG TOTAL) BY MOUTH AT BEDTIME. 08/10/20 11/19/23  Katsadouros, Vasilios, MD  calcium  acetate (PHOSLO) 667 MG capsule Take 1,334 mg by mouth 3 (three) times daily. Take 667 mg with snack    [provider]  cinacalcet (SENSIPAR) 30 MG tablet Take 30 mg by mouth daily. 11/01/23   [provider]  Darbepoetin Alfa  (ARANESP ) 100 MCG/0.5ML SOSY injection Inject 0.5 mLs (100 mcg total) into the vein every Monday with hemodialysis. 08/15/20   Katsadouros,  Vasilios, MD  diphenhydrAMINE (BENADRYL) 25 mg capsule Take 25 mg by mouth every 6 (six) hours as needed for itching.     [provider]  gabapentin  (NEURONTIN ) 100 MG capsule Take 100 mg by mouth 2 (two) times daily.    [provider]  isosorbide  mononitrate (IMDUR ) 30 MG 24 hr tablet TAKE 1 TABLET (30 MG TOTAL) BY MOUTH DAILY. 08/10/20 11/19/23  Katsadouros, Vasilios, MD  latanoprost  (XALATAN ) 0.005 % ophthalmic solution Place 1 drop into both eyes at bedtime.     [provider]  loperamide (IMODIUM) 2 MG capsule Take 2 mg by mouth as needed for diarrhea or loose stools.     [provider]  Melatonin 10 MG TABS Take 10 mg by mouth at bedtime as needed (sleep). Alternate with Zolpidem     [provider]  metoprolol  tartrate (LOPRESSOR ) 100 MG tablet Take 100 mg by mouth 2 (two) times daily.    [provider]  multivitamin (RENA-VIT) TABS tablet Take 1 tablet by mouth daily.    [provider]  omeprazole (PRILOSEC) 20 MG capsule Take 20 mg by mouth daily.     [provider]  triamcinolone cream (KENALOG) 0.1 % Apply 1 application  topically daily as needed ((dermatitis) After bathing).    [provider]  valGANciclovir  (VALCYTE ) 450 MG tablet Take 450 mg by mouth every Monday, Wednesday, and Friday. 08/22/17   [provider]  zolpidem  (AMBIEN ) 10 MG tablet  Take 10 mg by mouth at bedtime as needed for sleep. Alternate with melatonin    [provider]    Allergies: Tramadol hcl, Nsaids, and Darvon [propoxyphene]    Review of Systems  Gastrointestinal:  Positive for nausea.    Updated Vital Signs BP (!) 181/93 (BP Location: Right Arm)   Pulse 92   Temp 98.2 F (36.8 C) (Oral)   Resp 17   Ht 5' 6 (1.676 m)   Wt 69.5 kg   SpO2 100%   BMI 24.73 kg/m   Physical Exam Vitals and nursing note reviewed.  Constitutional:      General: He is not in acute distress.    Appearance: He is  not ill-appearing or toxic-appearing.  HENT:     Head: Normocephalic and atraumatic.     Mouth/Throat:     Mouth: Mucous membranes are moist.   Eyes:     General: No scleral icterus.       Right eye: No discharge.        Left eye: No discharge.     Conjunctiva/sclera: Conjunctivae normal.    Cardiovascular:     Rate and Rhythm: Normal rate and regular rhythm.     Pulses: Normal pulses.     Heart sounds: Normal heart sounds. No murmur heard. Pulmonary:     Effort: Pulmonary effort is normal. No respiratory distress.     Breath sounds: Normal breath sounds. No wheezing, rhonchi or rales.  Abdominal:     General: Abdomen is flat. There is no distension.     Palpations: There is no mass.     Tenderness: There is no abdominal tenderness.   Musculoskeletal:     Right lower leg: No edema.     Left lower leg: No edema.   Skin:    General: Skin is warm and dry.     Findings: No rash.   Neurological:     General: No focal deficit present.     Mental Status: He is alert and oriented to person, place, and time. Mental status is at baseline.   Psychiatric:        Mood and Affect: Mood normal.        Behavior: Behavior normal.     (all labs ordered are listed, but only abnormal results are displayed) Labs Reviewed  CBC WITH DIFFERENTIAL/PLATELET - Abnormal; Notable for the following components:      Result Value   WBC 3.8 (*)    RBC 2.60 (*)    Hemoglobin 9.2 (*)    HCT 29.2 (*)    MCV 112.3 (*)    MCH 35.4 (*)    RDW 15.9 (*)    Platelets 102 (*)    nRBC 0.5 (*)    All other components within normal limits  BASIC METABOLIC PANEL WITH GFR - Abnormal; Notable for the following components:   BUN 26 (*)    Creatinine, Ser 9.03 (*)    GFR, Estimated 6 (*)    All other components within normal limits  MAGNESIUM  CBG MONITORING, ED    EKG: EKG Interpretation Date/Time:  Thursday January 16 2024 10:38:24 EDT Ventricular Rate:  90 PR Interval:  170 QRS Duration:  92 QT  Interval:  376 QTC Calculation: 459 R Axis:   -41  Text Interpretation: Sinus rhythm with Premature atrial complexes Left axis deviation  HR improved compared to Jan 2022 Confirmed by Freddi Hamilton [phone]) on 01/16/2024 1:11:59 PM  Radiology: No results found.  Procedures   Medications Ordered in the ED - No data to display                                  Medical Decision Making  This patient presents to the ED for concern of vomiting and hyperkalemia, this involves an extensive number of treatment options, and is a complaint that carries with it a high risk of complications and morbidity.  The differential diagnosis includes acute viral gastroenteritis, UTI, appendicitis, cholecystitis,pancreatitis, nephrolithiasis, constipation, SBO   Co morbidities that complicate the patient evaluation   ESRD on MWF dialysis, DM, HTN, CHF,   Additional history obtained:  Dr. Artis PCP   Problem List / ED Course / Critical interventions / Medication management  Patient presents to ED concern for possible hyperkalemia.  Patient had some palpitations/nausea along with vomiting this morning.  Patient currently feels fine.  Physical exam reassuring.  Patient afebrile with stable vitals.   I Ordered, and personally interpreted labs.  BMP with elevated BUN/CR at patient's baseline currently at 2 6/9.03.  All electrolytes are within normal limits.  CBG 96.  Magnesium 1.7.  CBC with anemia near patient's baseline currently with hemoglobin 9.2 today.  No leukocytosis. The patient was maintained on a cardiac monitor.  I personally viewed and interpreted the cardiac monitored which showed an underlying rhythm of: sinus rhythm with PAC. I have reviewed the patients home medicines and have made adjustments as needed Patient eloped before I was able to share results and re-evalute.    Social Determinants of Health:  none        Final diagnoses:  Nausea and vomiting, unspecified vomiting  type    ED Discharge Orders     None          Hoy Nidia FALCON, [place] 01/16/24 1528    Freddi Hamilton, MD 01/17/24 1655

## 2024-01-16 NOTE — ED Notes (Signed)
 Patient has eloped. EDP aware

## 2024-01-16 NOTE — ED Notes (Signed)
 Patient walked away from his bed, unnknown where he is at this time. Will give 10 minutes for his return.  [facility] EDP aware.

## 2024-01-16 NOTE — ED Triage Notes (Signed)
 Pt coming in from home. Pt had dialysis yesterday and had a potassium of 9 before dialysis . Pt was called this morning by dialysis and told to come to the ED. Pt reports chest tightness , nausea and vomiting overnight .   Bp 160/82 99% on ra  Pulse 97 Cbg 134
